# Patient Record
Sex: Female | Born: 1955 | Race: White | Hispanic: No | Marital: Married | Smoking: Former smoker
Health system: Southern US, Community
[De-identification: ages and names within clinical notes are randomized; demographics above are authoritative.]

## PROBLEM LIST (undated history)

## (undated) DIAGNOSIS — C3490 Malignant neoplasm of unspecified part of unspecified bronchus or lung: Secondary | ICD-10-CM

## (undated) DIAGNOSIS — I2699 Other pulmonary embolism without acute cor pulmonale: Secondary | ICD-10-CM

## (undated) DIAGNOSIS — E119 Type 2 diabetes mellitus without complications: Secondary | ICD-10-CM

## (undated) DIAGNOSIS — C719 Malignant neoplasm of brain, unspecified: Secondary | ICD-10-CM

## (undated) DIAGNOSIS — E039 Hypothyroidism, unspecified: Secondary | ICD-10-CM

## (undated) DIAGNOSIS — IMO0002 Reserved for concepts with insufficient information to code with codable children: Secondary | ICD-10-CM

## (undated) DIAGNOSIS — IMO0001 Reserved for inherently not codable concepts without codable children: Secondary | ICD-10-CM

## (undated) HISTORY — PX: ABDOMINAL HYSTERECTOMY: SHX81

## (undated) HISTORY — DX: Hypothyroidism, unspecified: E03.9

## (undated) HISTORY — DX: Malignant neoplasm of brain, unspecified: C71.9

## (undated) HISTORY — DX: Type 2 diabetes mellitus without complications: E11.9

## (undated) HISTORY — DX: Reserved for inherently not codable concepts without codable children: IMO0001

## (undated) HISTORY — PX: CERVICAL FUSION: SHX112

## (undated) HISTORY — DX: Other pulmonary embolism without acute cor pulmonale: I26.99

## (undated) HISTORY — PX: SPINE SURGERY: SHX786

## (undated) HISTORY — DX: Malignant neoplasm of unspecified part of unspecified bronchus or lung: C34.90

## (undated) HISTORY — DX: Reserved for concepts with insufficient information to code with codable children: IMO0002

---

## 1998-06-01 ENCOUNTER — Encounter: Payer: Self-pay | Admitting: Family Medicine

## 1998-06-01 ENCOUNTER — Ambulatory Visit (HOSPITAL_COMMUNITY): Admission: RE | Admit: 1998-06-01 | Discharge: 1998-06-01 | Payer: Self-pay | Admitting: Family Medicine

## 1998-06-11 ENCOUNTER — Encounter: Payer: Self-pay | Admitting: Family Medicine

## 1998-06-11 ENCOUNTER — Ambulatory Visit (HOSPITAL_COMMUNITY): Admission: RE | Admit: 1998-06-11 | Discharge: 1998-06-11 | Payer: Self-pay | Admitting: Family Medicine

## 1999-01-18 ENCOUNTER — Ambulatory Visit (HOSPITAL_COMMUNITY): Admission: RE | Admit: 1999-01-18 | Discharge: 1999-01-18 | Payer: Self-pay | Admitting: Family Medicine

## 1999-01-18 ENCOUNTER — Encounter: Payer: Self-pay | Admitting: Family Medicine

## 1999-06-23 ENCOUNTER — Other Ambulatory Visit: Admission: RE | Admit: 1999-06-23 | Discharge: 1999-06-23 | Payer: Self-pay | Admitting: Obstetrics & Gynecology

## 2000-05-03 ENCOUNTER — Other Ambulatory Visit: Admission: RE | Admit: 2000-05-03 | Discharge: 2000-05-03 | Payer: Self-pay | Admitting: Obstetrics & Gynecology

## 2000-06-26 ENCOUNTER — Encounter: Payer: Self-pay | Admitting: Obstetrics & Gynecology

## 2000-07-03 ENCOUNTER — Encounter (INDEPENDENT_AMBULATORY_CARE_PROVIDER_SITE_OTHER): Payer: Self-pay

## 2000-07-03 ENCOUNTER — Inpatient Hospital Stay (HOSPITAL_COMMUNITY): Admission: RE | Admit: 2000-07-03 | Discharge: 2000-07-05 | Payer: Self-pay | Admitting: Obstetrics & Gynecology

## 2000-07-25 ENCOUNTER — Ambulatory Visit (HOSPITAL_COMMUNITY): Admission: RE | Admit: 2000-07-25 | Discharge: 2000-07-25 | Payer: Self-pay | Admitting: Urology

## 2002-02-24 ENCOUNTER — Emergency Department (HOSPITAL_COMMUNITY): Admission: EM | Admit: 2002-02-24 | Discharge: 2002-02-24 | Payer: Self-pay | Admitting: Emergency Medicine

## 2002-02-24 ENCOUNTER — Encounter: Payer: Self-pay | Admitting: Emergency Medicine

## 2004-09-02 ENCOUNTER — Other Ambulatory Visit: Admission: RE | Admit: 2004-09-02 | Discharge: 2004-09-02 | Payer: Self-pay | Admitting: Obstetrics & Gynecology

## 2004-12-13 ENCOUNTER — Ambulatory Visit (HOSPITAL_COMMUNITY): Admission: RE | Admit: 2004-12-13 | Discharge: 2004-12-13 | Payer: Self-pay | Admitting: Obstetrics & Gynecology

## 2005-05-19 ENCOUNTER — Encounter: Admission: RE | Admit: 2005-05-19 | Discharge: 2005-05-19 | Payer: Self-pay | Admitting: Orthopedic Surgery

## 2005-06-20 ENCOUNTER — Encounter: Admission: RE | Admit: 2005-06-20 | Discharge: 2005-06-20 | Payer: Self-pay | Admitting: Internal Medicine

## 2005-06-23 ENCOUNTER — Ambulatory Visit (HOSPITAL_COMMUNITY): Admission: RE | Admit: 2005-06-23 | Discharge: 2005-06-24 | Payer: Self-pay | Admitting: Specialist

## 2006-06-26 ENCOUNTER — Observation Stay (HOSPITAL_COMMUNITY): Admission: EM | Admit: 2006-06-26 | Discharge: 2006-06-27 | Payer: Self-pay | Admitting: Emergency Medicine

## 2007-12-10 ENCOUNTER — Encounter
Admission: RE | Admit: 2007-12-10 | Discharge: 2007-12-10 | Payer: Self-pay | Admitting: Physical Medicine and Rehabilitation

## 2008-10-21 ENCOUNTER — Encounter: Admission: RE | Admit: 2008-10-21 | Discharge: 2008-10-21 | Payer: Self-pay | Admitting: Obstetrics & Gynecology

## 2008-10-21 ENCOUNTER — Encounter (INDEPENDENT_AMBULATORY_CARE_PROVIDER_SITE_OTHER): Payer: Self-pay | Admitting: Diagnostic Radiology

## 2009-01-28 ENCOUNTER — Encounter: Admission: RE | Admit: 2009-01-28 | Discharge: 2009-01-28 | Payer: Self-pay | Admitting: Family Medicine

## 2009-02-03 ENCOUNTER — Ambulatory Visit (HOSPITAL_COMMUNITY): Admission: RE | Admit: 2009-02-03 | Discharge: 2009-02-03 | Payer: Self-pay | Admitting: Urology

## 2009-06-11 ENCOUNTER — Encounter: Admission: RE | Admit: 2009-06-11 | Discharge: 2009-06-11 | Payer: Self-pay | Admitting: Obstetrics & Gynecology

## 2009-12-01 ENCOUNTER — Encounter: Admission: RE | Admit: 2009-12-01 | Discharge: 2009-12-01 | Payer: Self-pay | Admitting: Obstetrics & Gynecology

## 2010-09-21 ENCOUNTER — Other Ambulatory Visit: Payer: Self-pay | Admitting: Obstetrics & Gynecology

## 2010-09-21 DIAGNOSIS — N6310 Unspecified lump in the right breast, unspecified quadrant: Secondary | ICD-10-CM

## 2010-09-29 ENCOUNTER — Other Ambulatory Visit (HOSPITAL_COMMUNITY): Payer: Self-pay | Admitting: Gastroenterology

## 2010-09-29 DIAGNOSIS — G8929 Other chronic pain: Secondary | ICD-10-CM

## 2010-09-29 DIAGNOSIS — R1013 Epigastric pain: Secondary | ICD-10-CM

## 2010-10-05 ENCOUNTER — Ambulatory Visit (HOSPITAL_COMMUNITY)
Admission: RE | Admit: 2010-10-05 | Discharge: 2010-10-05 | Disposition: A | Discharge status: Home / Self Care | Payer: 59 | Source: Ambulatory Visit | Attending: Gastroenterology | Admitting: Gastroenterology

## 2010-10-05 DIAGNOSIS — R1013 Epigastric pain: Secondary | ICD-10-CM | POA: Insufficient documentation

## 2010-10-05 DIAGNOSIS — G8929 Other chronic pain: Secondary | ICD-10-CM

## 2010-10-05 MED ORDER — TECHNETIUM TC 99M MEBROFENIN IV KIT
5.0000 | PACK | Freq: Once | INTRAVENOUS | Status: AC | PRN
  Administered 2010-10-05: 5 via INTRAVENOUS

## 2010-10-14 ENCOUNTER — Other Ambulatory Visit: Payer: Self-pay | Admitting: Gastroenterology

## 2010-10-15 ENCOUNTER — Ambulatory Visit
Admission: RE | Admit: 2010-10-15 | Discharge: 2010-10-15 | Disposition: A | Discharge status: Home / Self Care | Payer: 59 | Source: Ambulatory Visit | Attending: Obstetrics & Gynecology | Admitting: Obstetrics & Gynecology

## 2010-10-15 DIAGNOSIS — N6310 Unspecified lump in the right breast, unspecified quadrant: Secondary | ICD-10-CM

## 2010-10-15 DIAGNOSIS — R921 Mammographic calcification found on diagnostic imaging of breast: Secondary | ICD-10-CM

## 2010-12-06 LAB — BASIC METABOLIC PANEL
BUN: 19 mg/dL (ref 6–23)
CO2: 26 mEq/L (ref 19–32)
Calcium: 9.4 mg/dL (ref 8.4–10.5)
Chloride: 106 mEq/L (ref 96–112)
Creatinine, Ser: 0.83 mg/dL (ref 0.4–1.2)
GFR calc Af Amer: >60 mL/min (ref >60)
GFR calc non Af Amer: >60 mL/min (ref >60)
Glucose, Bld: 134 mg/dL — ABNORMAL HIGH (ref 70–99)
Potassium: 4.2 mEq/L (ref 3.5–5.1)
Sodium: 139 mEq/L (ref 135–145)

## 2010-12-06 LAB — HEMOGLOBIN AND HEMATOCRIT, BLOOD
HCT: 38.3 % (ref 36.0–46.0)
Hemoglobin: 13.3 g/dL (ref 12.0–15.0)

## 2010-12-06 LAB — GLUCOSE, CAPILLARY
Glucose-Capillary: 122 mg/dL — ABNORMAL HIGH (ref 70–99)
Glucose-Capillary: 130 mg/dL — ABNORMAL HIGH (ref 70–99)

## 2011-01-11 NOTE — Op Note (Signed)
NAME:  Carol Bond, Carol Bond               ACCOUNT NO.:  [id number]   MEDICAL RECORD NO.:  [id number]          PATIENT TYPE:  AMB   LOCATION:  DAY                          FACILITY:  [facility]   PHYSICIAN:  Lindaann Slough, M.D.  DATE OF BIRTH:  20-May-1956   DATE OF PROCEDURE:  02/03/2009  DATE OF DISCHARGE:                               OPERATIVE REPORT   PREOPERATIVE DIAGNOSIS:  Right ureteral stone with moderate  hydronephrosis.   POSTOPERATIVE DIAGNOSIS:  Right ureteral stone with moderate  hydronephrosis.   PROCEDURE:  Cystoscopy, ureteroscopy and stone extraction.   SURGEON:  Danae Chen, M.D.   ANESTHESIA:  General.   INDICATION:  The patient is a 55 years old female who has been  complaining of right flank pain on and off for the past 6 weeks.  The  pain was associated with gross hematuria.  She was treated a couple of  times for urinary tract infection.  Then CT scan showed a 3 x 6 mm stone  in the right distal ureter.  She was initially treated conservatively.  However, she has not passed the stone and has continued to be  symptomatic.  She is scheduled today for cystoscopy and stone  manipulation.   DESCRIPTION OF PROCEDURE:  The patient was identified by her wrist band  and proper time-out was taken.   Under general anesthesia she was prepped and draped and placed in dorsal  lithotomy position.  A panendoscope was inserted in the bladder.  There  was some edema around the right ureteral orifice and the stone could be  seen crowning at the orifice.  The left ureteral orifice is normal.  There is no stone or tumor in the bladder.   The Glidewire was passed through open-ended catheter and passed through  the ureteral orifice.  The ureteral catheter was advanced over the  guidewire in the distal ureter and then the Glidewire was removed and  replaced with a guide wire.  The open-ended catheter was then removed.  The [facility] rigid ureteroscope was then passed in the ureter and the  stone  was extracted without difficulty with a nitinol basket.  The  ureteroscope was then reinserted in the ureter and there was no evidence  of remaining stone fragment in the ureter.  The ureteroscope was  advanced into the distal and mid ureter and without evidence of filling  defect or stone in the ureter.  The ureteroscope was then removed.  The  cystoscope was reinserted in the bladder and a small stone fragment was  removed from the bladder  with a grasping forceps. The ureteral orifice was widely patent and I do  not think it was necessary to leave the double-J stent.  I then removed  the guidewire.  The bladder was emptied and cystoscope removed.   The patient tolerated the procedure well and left the OR in satisfactory  condition to post anesthesia care unit.      Lindaann Slough, M.D.  Electronically Signed     MN/MEDQ  D:  02/03/2009  T:  02/03/2009  Job:  [id number]

## 2011-01-14 NOTE — Op Note (Signed)
[facility]  Patient:    Carol Bond, Carol Bond                   MRN: [id number] Proc. Date: 07/03/00 Adm. Date:  [id number] Attending:  Mickle Mallory CC:         Gerrit Friends. Aldona Bar, M.D.   Operative Report  PREOPERATIVE DIAGNOSIS:  Stress urinary incontinence.  POSTOPERATIVE DIAGNOSIS:  Stress urinary incontinence.  PROCEDURES PERFORMED:  Anterior repair, pubovaginal sling, flexible cystoscopy, suprapubic tube placement.  SURGEON:  Barron Alvine, M.D.  ANESTHESIA:  General.  INDICATIONS:  Carol Bond is a 55 year old female.  She has been evaluated by Dr. Aldona Bar and felt to require hysterectomy because of some abnormal vaginal bleeding.  At the time of her evaluation, she also complained of a several year history of stress urinary incontinence.  By history, she had leakage with sneezing, laughing, and occasionally with coughing.  She had no other significant irritative voiding symptoms.  Objectively, we found her to have some evidence of urethral hypermobility.  She had a mild grade 2 cystocele. We were unable to find objective leakage in the sitting position with Valsalva and coughing but certainly by history she had moderate incontinence that was recently bothersome to her.  We had explained to the patient that with hysterectomy there can be further loss of support of the bladder base and urethra, and it is not unusual for incontinence to worsen.  It would be unlikely that her incontinence would improve status post hysterectomy.  We discussed the pros and cons of simple anterior repair versus anterior repair with pubovaginal sling.  She understood the advantages of this approach but also all the potential complications and risks.  After this discussion, the patient did elect to have a concurrent pubovaginal sling along with anterior repair in conjunction with her vaginal hysterectomy.  TECHNIQUE AND FINDINGS:  We entered the operating room with  the patient already under general anesthesia.  Dr. Aldona Bar had completed a vaginal hysterectomy without complications.  There had been minimal blood loss.  He left a portion of the vaginal cuff open anteriorly to allow [place] to start our anterior repair and exposure of the bladder base for her concurrent pubovaginal sling.  She was in high lithotomy.  We elected to reprep and drape the area up higher on the suprapubic region and placed her in a moderate lithotomy position at that time.  I infiltrated the vaginal mucosa and then extended the incision from the open area of the cuff up towards the mid urethraL area.  The vaginal flaps were then established, and these planes were easily dissected.  In this manner, we were able to dissect out the bladder. The bladder neck area was identified.  The retropubic spaces underwent blunt dissection bilaterally, and there was no evidence of significant urethral adhesions.  We then made a suprapubic incision right over the pubic symphysis, was carried down to the level of the fascia.  The actual sling itself was made with a piece of fascia lata from the [employer].  This was a 2 x 7 cm piece which was anchored on both ends with #1 nylon suture.  With direct digital finger control, a clamp was able to be passed behind the pubic symphysis and out the right side of the vaginal incision.  The nylon suture was grabbed and then brought out the suprapubic area.  A similar procedure was done on the left side, again with direct finger control throughout the procedure.  A Foley catheter was then placed in the bladder, and the bladder was decompressed.  The sling was then positioned underneath the bladder neck and pexed in open position with 3-0 Vicryl suture.  Flexible cystoscopy was then performed.  The sling appeared to be well positioned at the bladder neck. Blue dye could be seen from both orifices, and there was no evidence of bladder injury.  The  suprapubic tube was placed with direct visual guidance through a percutaneous site just above her suprapubic incision.  Prior to placement of the sling, we did place three or four horizontal 2-0 Vicryl mattress sutures to reduce the cystocele in a standard anterior repair manner. Her cystocele was grade 1-2.  A small amount of anterior redundant vaginal mucosa was then trimmed, and the vaginal incision was closed with a running 2-0 Vicryl suture, including the previously mentioned open area near the vaginal cuff.  Vaginal packing was utilized.  The suprapubic wound was infiltrated with Marcaine and copiously irrigated.  The nylon sutures were then tied very loosely over 2-3 fingers.  The subcutaneous tissues were closed with some Vicryl suture, and the skin was closed with clips.  The suprapubic tube was left to gravity during this period.  Estimated blood loss was 100-200 cc.  The patient tolerated the procedure well.  There were no obvious complications.  She was brought to the recovery room in stable condition. DD:  07/03/00 TD:  07/03/00 Job: [id number]

## 2011-01-14 NOTE — Op Note (Signed)
[facility]  Patient:    Carol Bond, Carol Bond                   MRN: [id number] Proc. Date: 07/03/00 Adm. Date:  [id number] Attending:  Mickle Mallory                           Operative Report  PREOPERATIVE DIAGNOSES:  Menorrhagia, uterine prolapse and stress urinary incontinence.  POSTOPERATIVE DIAGNOSES:  Menorrhagia, uterine prolapse and stress urinary incontinence.  PATHOLOGY:  Pending.  PROCEDURE:  First part total vaginal hysterectomy, second part to be dictated by Dr. Isabel Caprice pubovaginal sling.  SURGEON:  Dr. Aldona Bar (first part)  ASSISTANT:  Dr. Greta Doom (first part)  ANESTHESIA:  General endotracheal, Dr. Almeta Monas.  DESCRIPTION OF PROCEDURE:  The patient was taken to the operating room and after satisfactory induction of general endotracheal anesthesia was prepped and draped in the usual fashion having been placed in lithotomy position in the short Allen stirrups. At this time, after she was prepped and draped, the bladder was drained of clear urine ______ catheter in an in and out fashion and the procedure was begun.  A posterior weighted speculum was placed in the vagina and a double tooth tenaculum placed on the cervix. The cervix was circumferentially injected at this time with a solution of 0.5% Marcaine with epinephrine. Thereafter a circumferential incision was made about the cervix separating the cervix from the vagina. This was dissected posteriorly and the posterior peritoneum was identified and entered appropriately. At this time, the uterosacral pedicles were taken bilaterally using curved Heaney clamps and suture ligature of #0 Vicryl suture. At this time, additional parametrial pedicles were taken a similar fashion. Thereafter the anterior peritoneum was identified and entered appropriately with bowel well seen above the uterus. At this time, the uterine artery pedicles were clamped, cut and suture secured with #0 Vicryl  suture. The specimen was then inverted. The ovarian pedicles were then clamped, cut and doubly suture secured with #0 Vicryl suture. The specimen at this time was removed in its entirety. Both ovaries and tubes looked normal. The posterior cuff was then rendered hemostatic with a running #0 Vicryl suture which was locked from uterosacral to uterosacral. Assured of no bleeding, at this time the peritoneum was closed using #0 Vicryl in a pursestring suture extraperitonealizing all vascular pedicles except for the ovarian pedicles which were cut free. The uterosacrals were then brought together in the midline. The superior portion of the vaginal cuff was left open for Dr. Isabel Caprice to continue with the pubovaginal sling. Estimated blood loss up to this point approximately 150 cc. All counts correct x 2. Pathologic specimen consisted of the uterus. DD:  07/03/00 TD:  07/03/00 Job: [id number]

## 2011-01-14 NOTE — Discharge Summary (Signed)
[facility]  Patient:    Carol Bond, Carol Bond                   MRN: [id number] Adm. Date:  [id number] Disc. Date: 07/05/00 Attending:  Mickle Mallory                           Discharge Summary  DISCHARGE DIAGNOSES: 1. Menorrhagia. 2. Uterine prolapse. 3. Stress urinary incontinence.  PROCEDURES: 1. Total vaginal hysterectomy, Dr. Aldona Bar. 2. Anterior repair, pubovaginal sling, flex cystoscopy, suprapubic catheter,    Dr. Isabel Caprice.  HOSPITAL COURSE:  This 55 year old, gravida 2, para 2, was admitted after total vaginal hysterectomy and pubovaginal sling, anterior repair, suprapubic catheter placement, and cystoscopy which was carried out on July 03, 2000. Her preoperative course was acceptable.  Her procedure went well.  Pathologic specimen included the uterus, and final diagnosis was adenomyosis and leiomyomata.  The uterus weighed 114 grams.  The patients postoperative course was totally uncomplicated.  She remained afebrile during her entire postoperative course.  Her discharge hemoglobin was 11.3 with a white blood count of 6.4.  On the morning of November 7, she was ambulating well, tolerating a regular diet well, having normal bowel function, was afebrile, was tolerating oral analgesia well, and was deemed ready for discharge.  The suprapubic catheter was still in place, and she was awaiting instructions by Dr. Isabel Caprice as she was having some difficulty spontaneously and was using the suprapubic catheter on her on for voiding purposes.  DISCHARGE FOLLOWUP:  Follow-up by Dr. Isabel Caprice will be probably in several days in the office.  Follow-up by [place] will be three weeks time.  DISCHARGE MEDICATIONS: 1. Tylox one to two q.4-6h. p.r.n. pain 2. Motrin 600 mg q.6h. (she was given prescriptions for both).  CONDITION ON DISCHARGE:  Improved.  DISCHARGE INSTRUCTIONS:  She was given all appropriate instructions at the time of discharge. DD:   07/05/00 TD:  07/05/00 Job: [id number]

## 2011-01-14 NOTE — H&P (Signed)
NAME:  Carol Bond, LUTZE NO.:  [id number]   MEDICAL RECORD NO.:  [id number]          PATIENT TYPE:  INP   LOCATION:  2017                         FACILITY:  [facility]   PHYSICIAN:  Francisca December, M.D.  DATE OF BIRTH:  October 28, 1955   DATE OF ADMISSION:  06/26/2006  DATE OF DISCHARGE:                                HISTORY & PHYSICAL   PRIMARY CARE PHYSICIAN:  Dario Guardian, M.D.   CARDIOLOGIST:  Francisca December, M.D.   CHIEF COMPLAINT:  Chest pain.   HISTORY OF PRESENT ILLNESS:  Carol Bond is a 55 year old Caucasian female  with no known cardiac history.  The patient complained of chest pressure for  the past 2 weeks that is located in her left breast area.  She has a history  of GERD and typically treats her heartburn-related illness with Prilosec OTC  20 mg.  She initially thought her chest pain was related to GERD and took  Prilosec OTC 20 mg without relief.  During this past weekend, intermittent  chest pain started that radiated to her left shoulder and to the back of her  left arm as well as her back.  This morning she presented to the primary  care physicians office and a 12-lead EKG was obtained.  The EKG revealed  changes in comparison with a prior EKG that was obtained in 1999.  The EKG  changes were not specified to the patient, nor was the 1999 EKG forwarded  with the patient to the [facility] emergency department.  The patient arrived to  the [facility] emergency department via ambulance and was given en route one  aspirin 325 mg plus two sublingual nitroglycerin plus oxygen with relief.  Upon arrival to the [facility] emergency department, a 12-lead EKG was obtained  that revealed normal sinus rhythm at 67 beats per minute with first degree  AV block and old Q wave at the anteroseptal leads.  There was no evidence of  ischemia.  Associated with the chest pain was severe diaphoresis that had  resolved upon arrival to the ED.  The patient also experienced headaches  and  nosebleeds while at home that she believes is secondary to elevated blood  pressure.  She denies shortness of breath, nausea, and vomiting.   PAST MEDICAL HISTORY:  1. History of DVT/PE in 1979 during her pregnancy.  2. Dyslipidemia.  3. Hypothyroidism.  4. Depression.  5. Tobacco abuse.   ALLERGIES:  No known drug allergies.   CURRENT MEDICATIONS:  1. Levothroid 0.5 mg daily.  2. Lipitor 10 mg daily.  3. Paxil 25 mg daily.   FAMILY HISTORY:  Father deceased, hypertension.  Mother deceased, brain  tumor.   SOCIAL HISTORY:  Married with two daughters.  Lives with her husband and her  [place]  She admits to tobacco abuse with a 45  pack-year history of smoking.  She denies alcohol or illicit drug use.   REVIEW OF SYSTEMS:  All other systems reviewed are negative other than what  is stated in the HPI.   PHYSICAL EXAMINATION:  GENERAL:  A 55 year old female, pleasant and  cooperative, NAD.  VITAL SIGNS:  Temperature 98.3, blood pressure 120/68, pulse 68,  respirations 18, O2 saturations 95% over 2 L.  HEENT:  Unremarkable.  NECK:  Supple without JVD or carotid bruits bilaterally.  PULMONARY:  Breath sounds are equal and clear to auscultation bilaterally.  No use of accessory muscles.  CARDIOVASCULAR:  Regular rate and rhythm.  Normal S1 and S2 without murmurs,  gallops, clicks or rubs.  ABDOMEN:  Soft, nontender, nondistended, with active bowel sounds.  No  masses, organomegaly or bilateral bruits.  EXTREMITIES:  No peripheral edema.  DP and PT pulses 2+/2 bilaterally.  SKIN:  Warm and dry without rashes or lesions.  NEUROLOGIC:  No sensory or motor deficits.  PSYCHIATRIC:  Normal mood and affect.  BACK:  No kyphosis or scoliosis.   LABORATORY DATA:  White blood count 6.6, hemoglobin 14.3, hematocrit 41.5,  platelets 242.  Sodium 137, potassium 4.0, chloride 106, bicarb 26.5, BUN  17, creatinine 0.9, glucose 117.  Point of care enzymes x1:   Myoglobin 65.7,  CK-MB 1.2, troponin I less than 0.05.  Chest x-ray:  No acute  cardiopulmonary process.  Serial cardiac enzymes:  CK total 70, CK-MB 1.5,  troponin I 0.01.   ASSESSMENT:  1. Chest pain.  2. Gastroesophageal reflux disease.  3. History of deep vein thrombosis/pulmonary embolus in 1979 during her      pregnancy.  4. Dyslipidemia.  5. Hypothyroidism.  6. Tobacco abuse.  7. Depression.   PLAN:  1. Admit to cardiac telemetry unit under the service of Francisca December,      M.D., for the diagnosis of chest pain.  2. Obtain cardiac panel, including troponin I, q.8h. x3 with the first set      to be drawn now.  3. Start subcu Lovenox every 12 hours per pharmacy protocol.  4. Start nitroglycerin at 10 mcg/min.  Titrate by 5 mcg/min. for chest      pain relief as needed.  5. Smoking cessation.  6. Stress Cardiolite in the a.m.  7. N.P.O. after midnight except for medications.   The patient was seen, interviewed, and examined by Dr. Corliss Marcus, who  participated in the medical decision-making and plan of care.      Tylene Fantasia, [place]      Francisca December, M.D.  Electronically Signed    RDM/MEDQ  D:  06/27/2006  T:  06/27/2006  Job:  [id number]

## 2011-01-14 NOTE — Op Note (Signed)
NAME:  Carol Bond, Carol Bond               ACCOUNT NO.:  [id number]   MEDICAL RECORD NO.:  [id number]          PATIENT TYPE:  AMB   LOCATION:  DAY                          FACILITY:  [facility]   PHYSICIAN:  Jene Every, M.D.    DATE OF BIRTH:  August 27, 1956   DATE OF PROCEDURE:  06/23/2005  DATE OF DISCHARGE:                                 OPERATIVE REPORT   PREOPERATIVE DIAGNOSES:  Spinal stenosis, herniated nucleus pulposus 4-5.   POSTOPERATIVE DIAGNOSES:  Spinal stenosis, herniated nucleus pulposus 4-5.   PROCEDURE:  Bilateral lateral recessed decompression, foraminotomies of L5,  microdiskectomy L4-5, left.   ANESTHESIA:  General.   ASSISTANT:  Roma Schanz, P.A.   Use of operating microscope.   BRIEF HISTORY:  A 55 year old with lower extremity radicular pain, left  greater than right, lateral recess stenosis, paracentral disk protrusion at  4-5 noted. On her MRI, she had refractory left lower extremity radicular  pain, EHL weakness, positive neural tension signs. She actually had right  sided symptoms as well thought perhaps some dynamic neural compression.  Operative intervention was indicated for decompression and microdiskectomy.  Evaluation bilaterally. The risks and benefits were discussed including  bleeding, infection, damage to neurovascular structures, CSF leakage,  epidural fibrosis, adjacent segment disease, need for fusion in the future,  anesthetic complications, etc.   TECHNIQUE:  The patient in supine position and after the induction of  adequate general anesthesia and 1 gram of Kefzol, she was placed prone on  the Andrews frame with the cervical spine placed in slight flexion. She had  some underlying cervical spondylosis noted on a recent myelogram. This was  performed at L4-5 and she had no preoperative headache. The lumbar region  was prepped and draped in the usual sterile fashion, two 18 gauge spinal  needles were utilized to localize the 4-5 interspace,  confirmed with x-ray.  An incision made from the spinous process of 4 to 5, subcutaneous tissue was  dissected, electrocautery was utilized to achieve hemostasis. The  dorsolumbar fascia identified and divided in line with the skin incision,  paraspinous muscle elevated from the lamina of 4 and 5 bilaterally. A  McCullough retractor was placed bilaterally, Penfield 4 in the intralaminar  space. Confirmatory radiograph obtained confirming the 4-5 space. The  operating microscope was draped and brought on the surgical field. Attention  turned first to the left side, hemilaminotomy of the caudad edge of 4 was  performed with a 2-3 mm Kerrison, detachment of the ligamentum flavum.  Hypertrophic ligamentum flavum was noted. A curette utilized to detach the  cephalad edge of the ligamentum from 5. Foraminotomy of 5 was performed with  a 2 mm Kerrison. The ligamentum flavum removed from the interspace. The  Penfield 4 was utilized to gently mobilize the thecal sac and the nerve root  medially, compression in the lateral recess was appreciated and the facet  was undercut with a 2 mm Kerrison decompressing the full lateral recess.  Bipolar electrocautery was utilized to achieve hemostasis. There was an  epidural venous plexus noted. A small paracentral disk protrusion was noted.  Annulotomy performed and a copious portion of disk material was removed from  the disk space, subannular space. Medially with the straight and upbiting  pituitary, multiple passes were obtained as well as out laterally. It was  further mobilized in the disk space with an Epstein and a hockey stick not  curetting the end plates, however. After a full diskectomy of herniated  material, there was degenerative gelatinous material appreciated,  degenerated disk was appreciated as well. I copiously irrigated the disk  space with antibiotic irrigation. Bone wax placed on the cancellous  surfaces. We turned our attention to the  contralateral side in a similar  fashion, the lateral recess was decompressed on the contralateral side with  the foraminotomy of L5 performed. The ligamentum flavum removed from the  interspace, facet undercut, nerve root and thecal sac gently mobilized  medially. The disk was not herniated here. There was a large epidural vein  that was cauterized, however. Some moderate lateral recess stenosis was  noted here as we decompressed this. No evidence of CSF leakage or active  bleeding, copious irrigation was performed and the contralateral cancellous  surface was covered with the bone wax as well, thrombin soaked Gelfoam in  the intralaminar space. We placed a hockey stick probe out in the foramen of  5 and 4 on left found to be widely patent. We checked beneath the thecal  sac, axilla of the nerve root, no evidence of any neural compressive  material. We turned our attention back to the left. I gently mobilized the  thecal sac and nerve root medially, made a few additional passes with the  straight pituitary without any retrieval. I examined the foramen of 4 and 5,  widely patent, at least of a centimeter of excursion of the nerve root  medial to the pedicle without difficulty. The disk space was copiously  irrigated with antibiotic irrigation, no disk material was noted beneath the  thecal sac either. Inspection revealed no evidence of CSF leakage or active  bleeding. Thrombin soaked Gelfoam was placed in the laminotomy defect,  McCullough retractor was removed, paraspinous muscles inspected with no  evidence of active bleeding. The dorsolumbar fascia reapproximated with #1  Vicryl interrupted figure-of-eight sutures. The subcutaneous tissues with 2-  0 Vicryl simple sutures, skin 4-0 subcuticular Prolene, wound reinforced  with Steri-Strips. A sterile dressing applied, placed supine on the hospital  bed, extubated without difficulty and transported to the recovery room in  satisfactory  condition.   The patient tolerated the procedure well with no complications. Blood loss  minimal.      Jene Every, M.D.  Electronically Signed     JB/MEDQ  D:  06/23/2005  T:  06/23/2005  Job:  [id number]

## 2011-01-14 NOTE — H&P (Signed)
[facility]  Patient:    Carol Bond, Carol Bond                        MRN: [id number] Adm. Date:  07/03/00 Attending:  Gerrit Friends. Aldona Bar, M.D.                         History and Physical  HISTORY OF PRESENT ILLNESS:  Trea Carnegie is a 55 year old, married female, gravida 2, para 2, admitted for total vaginal hysterectomy and a vaginal sling procedure.  She has a history of menorrhagia, prolapse, and stress urinary incontinence.  She is having regular cyclic menses but her periods are much heavier than one would normally expect, although she has not had any associated anemia.  She does also relate increased pelvic pressure when standing for long periods of time and for the past several years has had worsening stress urinary incontinence for which recently she has been seen by Dr. Barron Alvine who will be doing a pubovaginal sling after the total vaginal hysterectomy is completed.  The patients past history is significant in that she has a history of a deep vein thrombosis with a pregnancy and in addition had a pulmonary embolus--spontaneously--in the 1970s, not associated with birth control use. She also has hyperlipidemia for which she is currently on medication and she is also a smoker and has had a problem with a cervical disk which required fusion.  She has had a history of two previous vaginal deliveries, the last of which was in the early 1980s.  She recently has had normal cervical cytology and normal mammograms.  She also has hypothyroidism which she currently is medicated for as well.  At this time she is admitted for total vaginal hysterectomy and pubovaginal sling with a preoperative diagnosis of uterine prolapse, menorrhagia, and stress urinary incontinence.  ALLERGIES:  The patient has no known allergies.  MEDICATIONS: 1. Synthroid 0.175 mg a day. 2. Lipitor 10 mg at bedtime.  PAST SURGICAL HISTORY:  Cervical fusion as mentioned.  She also  has had a previous tubal sterilization procedure which was carried out in 1988.  Social history, family history negative with the exception of the above.  REVIEW OF SYSTEMS:  Negative with the exception of the above.  PHYSICAL EXAMINATION:  GENERAL:  Examination at the time of admission finds a well-developed female, weight 186, height 5 feet 6.5 inches.  VITAL SIGNS:  Blood pressure 120/80, temperature 98.2, respirations 18 and regular, pulse 85 and regular.  HEENT:  Negative.  Thyroid not enlarged.  CHEST:  Clear to auscultation and percussion.  CARDIOVASCULAR:  Normal rhythm.  No murmur.  BREASTS:  Negative.  ABDOMEN:  On abdominal examination no masses are felt.  Bowel sounds are well heard.  PELVIC:  On pelvic examination there is prolapse of the cervix almost to the introitus.  There are no gross lesions in the vagina on the cervix or on the introitus.  Uterus is upper limits of normal size.  Adnexal area is negative. Rectovaginal confirmatory.  EXTREMITIES:  Negative.  NEUROLOGICAL:  Neurologic examination physiologic.  IMPRESSION:  Uterine prolapse, menorrhagia, and stress urinary incontinence.  PLAN:  The patient will undergo a total vaginal hysterectomy and pubovaginal sling.  She will also be prophylaxed with subcu heparin postoperatively as well as compression stockings because of her history of pulmonary embolus in the past.  She will be maintained on her Lipitor and Synthroid for her hypertriglyceridemia  and her hypothyroidism respectively.  DD:  06/30/00 TD:  06/30/00 Job: [id number]

## 2011-01-14 NOTE — Discharge Summary (Signed)
NAME:  Carol Bond, SCHMIEDER NO.:  [id number]   MEDICAL RECORD NO.:  [id number]          PATIENT TYPE:  INP   LOCATION:  2017                         FACILITY:  [facility]   PHYSICIAN:  Francisca December, M.D.  DATE OF BIRTH:  08/10/1956   DATE OF ADMISSION:  06/26/2006  DATE OF DISCHARGE:  06/27/2006                               DISCHARGE SUMMARY   ADMISSION DIAGNOSIS:  Chest pain.   DISCHARGE DIAGNOSES:  1. Chest pain status post normal stress Cardiolite without inducible      ischemia.  Ejection fraction 76% on June 27, 2006.  2. Gastroesophageal reflux disease.  3. Dyslipidemia.  4. Hypertriglyceridemia.  5. Hypothyroidism.  6. Depression.  7. Tobacco abuse with recommended cessation.  8. History of [employer] deep vein thrombosis/pulmonary embolism in 1979,      during her pregnancy.   PROCEDURE:  Stress Cardiolite on June 27, 2006.   HOSPITAL COURSE:  Ms. Kain is a 55 year old female with no known  cardiac history.  She was admitted to the [facility] on June 26, 2006, with a chief complaint of chest pain.  Point-of-care enzymes  were negative x2, with a peak troponin of less than 0.05.  Serial  cardiac enzymes were negative x3, with a peak troponin of 0.02.  A chest  x-ray revealed no acute cardiopulmonary disease.  A 12-lead EKG revealed  a normal sinus rhythm with first-degree AV block with a ventricular rate  of 67 beats per minute and old Q-waves in the anteroseptal leads.  There  was no evidence of ischemia.  The patient was referred for smoking  cessation consult and was given a prescription upon discharge of  Chantix.  The following day the patient underwent a stress Cardiolite revealing no  inducible ischemia with an EF of.  She was started on Protonix 40 mg  daily as an inpatient and given a prescription upon discharge.  Ms. Morford was discharged to home in stable condition on June 27, 2006, without angina, shortness of  breath, dizziness, or diaphoresis.   LABORATORY DATA:  White blood count 6.4, hemoglobin 13.9, hematocrit  39.6, platelets 229.  PT 12.5, INR 0.9, PTT 27.  BNP less than 30.  Sodium 140, potassium 4.7, chloride 103, CO2 30, glucose 142, BUN 17,  creatinine 0.8.  Serial cardiac enzymes, CK total 70, 65, and 66,  respectively; CK MB 1.5 x2, and 1.3; Troponin I 0.01 x2, and 0.02.  Fasting lipid panel revealed a total cholesterol of 287, triglycerides  of 1261, HDL 23, LDL was unable to be calculated.  Point-of-care enzymes  myoglobin 65.7 and 55.3, CK MB 1.2 and 1.5, troponin I less than 0.05  x2.   X-RAYS:  Chest x-ray, June 26, 2006, no active cardiopulmonary  disease.   EKG:  June 27, 2006, sinus rhythm with first-degree AV block with a  ventricular rate of 70 beats per minute and Q-waves in the anteroseptal  leads.  No evidence of ischemia.   CONDITION ON DISCHARGE:  Ms. Bealer was discharged to home in stable  condition without complaints of angina, shortness  of breath, dizziness,  nausea, vomiting, or diaphoresis.   DISCHARGE MEDICATIONS:  1. Levothyroxine 50 mcg daily.  2. Paxil 25 mg daily.  3. Lipitor 10 mg daily.  4. Protonix 40 mg daily.  This was a new prescription, a prescription      was given with refills.  5. Chantix 1 mg starter pack.  This was a new prescription, a      prescription was given with refills.   DISCHARGE INSTRUCTIONS:  Continue a heart-healthy diet including low  cholesterol and low fat.   FOLLOWUP ARRANGEMENTS:  The patient was advised to schedule a followup  appointment with her primary care physician for other etiology of chest  pain.      Tylene Fantasia, [place]      Francisca December, M.D.  Electronically Signed    RDM/MEDQ  D:  08/08/2006  T:  08/08/2006  Job:  [id number]   cc:   Dario Guardian, M.D.

## 2011-09-21 ENCOUNTER — Other Ambulatory Visit: Payer: Self-pay | Admitting: Obstetrics & Gynecology

## 2011-09-21 DIAGNOSIS — R920 Mammographic microcalcification found on diagnostic imaging of breast: Secondary | ICD-10-CM

## 2011-10-17 ENCOUNTER — Ambulatory Visit
Admission: RE | Admit: 2011-10-17 | Discharge: 2011-10-17 | Disposition: A | Discharge status: Home / Self Care | Payer: 59 | Source: Ambulatory Visit | Attending: Obstetrics & Gynecology | Admitting: Obstetrics & Gynecology

## 2011-10-17 DIAGNOSIS — R920 Mammographic microcalcification found on diagnostic imaging of breast: Secondary | ICD-10-CM

## 2012-09-18 ENCOUNTER — Other Ambulatory Visit: Payer: Self-pay | Admitting: Obstetrics & Gynecology

## 2012-09-18 DIAGNOSIS — Z1231 Encounter for screening mammogram for malignant neoplasm of breast: Secondary | ICD-10-CM

## 2012-10-17 ENCOUNTER — Ambulatory Visit: Payer: 59

## 2012-11-15 ENCOUNTER — Ambulatory Visit
Admission: RE | Admit: 2012-11-15 | Discharge: 2012-11-15 | Disposition: A | Discharge status: Home / Self Care | Payer: 59 | Source: Ambulatory Visit | Attending: Obstetrics & Gynecology | Admitting: Obstetrics & Gynecology

## 2012-12-03 ENCOUNTER — Other Ambulatory Visit: Payer: Self-pay | Admitting: Gastroenterology

## 2013-01-25 ENCOUNTER — Other Ambulatory Visit: Payer: Self-pay | Admitting: Neurological Surgery

## 2013-01-25 DIAGNOSIS — M549 Dorsalgia, unspecified: Secondary | ICD-10-CM

## 2013-02-07 ENCOUNTER — Ambulatory Visit
Admission: RE | Admit: 2013-02-07 | Discharge: 2013-02-07 | Disposition: A | Discharge status: Home / Self Care | Payer: 59 | Source: Ambulatory Visit | Attending: Neurological Surgery | Admitting: Neurological Surgery

## 2013-02-07 VITALS — BP 91/54 | HR 69 | Ht 66.0 in | Wt 188.0 lb

## 2013-02-07 DIAGNOSIS — M549 Dorsalgia, unspecified: Secondary | ICD-10-CM

## 2013-02-07 MED ORDER — DIAZEPAM 5 MG PO TABS
10.0000 mg | ORAL_TABLET | Freq: Once | ORAL | Status: AC
  Administered 2013-02-07: 10 mg via ORAL

## 2013-02-07 MED ORDER — ONDANSETRON HCL 4 MG/2ML IJ SOLN
4.0000 mg | Freq: Once | INTRAMUSCULAR | Status: AC
  Administered 2013-02-07: 4 mg via INTRAMUSCULAR

## 2013-02-07 MED ORDER — IOHEXOL 180 MG/ML  SOLN
15.0000 mL | Freq: Once | INTRAMUSCULAR | Status: AC | PRN
  Administered 2013-02-07: 15 mL via INTRATHECAL

## 2013-02-07 MED ORDER — MEPERIDINE HCL 100 MG/ML IJ SOLN
75.0000 mg | Freq: Once | INTRAMUSCULAR | Status: AC
  Administered 2013-02-07: 75 mg via INTRAMUSCULAR

## 2013-02-07 NOTE — Progress Notes (Signed)
Pt states she has been off lexapro for the past 2 days. Discharge instructions explained.

## 2013-11-07 ENCOUNTER — Other Ambulatory Visit: Payer: Self-pay

## 2013-11-07 DIAGNOSIS — Z1231 Encounter for screening mammogram for malignant neoplasm of breast: Secondary | ICD-10-CM

## 2013-11-28 ENCOUNTER — Ambulatory Visit: Payer: 59

## 2013-12-03 ENCOUNTER — Other Ambulatory Visit: Payer: Self-pay | Admitting: Family Medicine

## 2013-12-03 DIAGNOSIS — Z86711 Personal history of pulmonary embolism: Secondary | ICD-10-CM

## 2013-12-03 DIAGNOSIS — R059 Cough, unspecified: Secondary | ICD-10-CM

## 2013-12-03 DIAGNOSIS — R05 Cough: Secondary | ICD-10-CM

## 2013-12-03 DIAGNOSIS — R0602 Shortness of breath: Secondary | ICD-10-CM

## 2013-12-05 ENCOUNTER — Ambulatory Visit
Admission: RE | Admit: 2013-12-05 | Discharge: 2013-12-05 | Disposition: A | Discharge status: Home / Self Care | Payer: 59 | Source: Ambulatory Visit

## 2013-12-05 DIAGNOSIS — Z1231 Encounter for screening mammogram for malignant neoplasm of breast: Secondary | ICD-10-CM

## 2013-12-09 ENCOUNTER — Ambulatory Visit
Admission: RE | Admit: 2013-12-09 | Discharge: 2013-12-09 | Disposition: A | Discharge status: Home / Self Care | Payer: 59 | Source: Ambulatory Visit | Attending: Family Medicine | Admitting: Family Medicine

## 2013-12-09 DIAGNOSIS — R059 Cough, unspecified: Secondary | ICD-10-CM

## 2013-12-09 DIAGNOSIS — Z86711 Personal history of pulmonary embolism: Secondary | ICD-10-CM

## 2013-12-09 DIAGNOSIS — R0602 Shortness of breath: Secondary | ICD-10-CM

## 2013-12-09 DIAGNOSIS — R05 Cough: Secondary | ICD-10-CM

## 2013-12-09 MED ORDER — IOHEXOL 350 MG/ML SOLN
125.0000 mL | Freq: Once | INTRAVENOUS | Status: AC | PRN
Start: 2013-12-09 — End: 2013-12-09
  Administered 2013-12-09: 125 mL via INTRAVENOUS

## 2013-12-10 ENCOUNTER — Telehealth: Payer: Self-pay | Admitting: *Deleted

## 2013-12-10 NOTE — Telephone Encounter (Signed)
Called left vm message regarding appt for [facility] 12/12/13

## 2013-12-10 NOTE — Telephone Encounter (Signed)
Called pt with appt for [facility] 12/12/13.  She verbalized understanding of appt time and place

## 2013-12-12 ENCOUNTER — Encounter: Payer: Self-pay | Admitting: *Deleted

## 2013-12-12 ENCOUNTER — Ambulatory Visit
Admission: RE | Admit: 2013-12-12 | Discharge: 2013-12-12 | Disposition: A | Discharge status: Home / Self Care | Payer: 59 | Source: Ambulatory Visit | Attending: Radiation Oncology | Admitting: Radiation Oncology

## 2013-12-12 ENCOUNTER — Telehealth: Payer: Self-pay | Admitting: Internal Medicine

## 2013-12-12 ENCOUNTER — Ambulatory Visit (INDEPENDENT_AMBULATORY_CARE_PROVIDER_SITE_OTHER): Payer: 59 | Admitting: Cardiothoracic Surgery

## 2013-12-12 ENCOUNTER — Telehealth: Payer: Self-pay | Admitting: *Deleted

## 2013-12-12 ENCOUNTER — Encounter: Payer: Self-pay | Admitting: Cardiothoracic Surgery

## 2013-12-12 ENCOUNTER — Ambulatory Visit: Payer: 59 | Attending: Internal Medicine | Admitting: Physical Therapy

## 2013-12-12 ENCOUNTER — Encounter: Payer: Self-pay | Admitting: Radiation Oncology

## 2013-12-12 ENCOUNTER — Ambulatory Visit (HOSPITAL_BASED_OUTPATIENT_CLINIC_OR_DEPARTMENT_OTHER): Payer: 59 | Admitting: Internal Medicine

## 2013-12-12 ENCOUNTER — Other Ambulatory Visit: Payer: Self-pay | Admitting: *Deleted

## 2013-12-12 VITALS — BP 141/81 | HR 121 | Temp 98.4°F | Resp 16

## 2013-12-12 VITALS — BP 141/81 | HR 121 | Temp 98.4°F | Resp 22 | Ht 66.0 in | Wt 180.1 lb

## 2013-12-12 DIAGNOSIS — R918 Other nonspecific abnormal finding of lung field: Secondary | ICD-10-CM

## 2013-12-12 DIAGNOSIS — R51 Headache: Secondary | ICD-10-CM

## 2013-12-12 DIAGNOSIS — R5383 Other fatigue: Secondary | ICD-10-CM

## 2013-12-12 DIAGNOSIS — C3490 Malignant neoplasm of unspecified part of unspecified bronchus or lung: Secondary | ICD-10-CM | POA: Insufficient documentation

## 2013-12-12 DIAGNOSIS — R222 Localized swelling, mass and lump, trunk: Secondary | ICD-10-CM

## 2013-12-12 DIAGNOSIS — R05 Cough: Secondary | ICD-10-CM

## 2013-12-12 DIAGNOSIS — C3412 Malignant neoplasm of upper lobe, left bronchus or lung: Secondary | ICD-10-CM | POA: Insufficient documentation

## 2013-12-12 DIAGNOSIS — J90 Pleural effusion, not elsewhere classified: Secondary | ICD-10-CM

## 2013-12-12 DIAGNOSIS — E039 Hypothyroidism, unspecified: Secondary | ICD-10-CM | POA: Insufficient documentation

## 2013-12-12 DIAGNOSIS — R5381 Other malaise: Secondary | ICD-10-CM

## 2013-12-12 DIAGNOSIS — E119 Type 2 diabetes mellitus without complications: Secondary | ICD-10-CM | POA: Insufficient documentation

## 2013-12-12 DIAGNOSIS — R0602 Shortness of breath: Secondary | ICD-10-CM | POA: Insufficient documentation

## 2013-12-12 DIAGNOSIS — I2699 Other pulmonary embolism without acute cor pulmonale: Secondary | ICD-10-CM | POA: Insufficient documentation

## 2013-12-12 DIAGNOSIS — R059 Cough, unspecified: Secondary | ICD-10-CM

## 2013-12-12 DIAGNOSIS — IMO0001 Reserved for inherently not codable concepts without codable children: Secondary | ICD-10-CM | POA: Insufficient documentation

## 2013-12-12 NOTE — Progress Notes (Signed)
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 female who is seen out of the courtesy of Dr. Moreen Fowler as part of the multi-disciplinary thoracic oncology clinic.   the patient presented earlier this year with upper respiratory symptoms. She was initially felt to have sinusitis and was treated for this issue. She however continued to have coughing and was treated with Levaquin. She continued to have cough and respiratory symptoms. A chest CT scan was ordered which is documented below showed a large left hilar and mediastinal mass causing near complete occlusion of the distal left main pulmonary artery. Scans were most compatible with primary lung cancer. In addition there was a possible left adrenal metastasis and a lesion in the left upper lobe measuring 1.2 cm in size suspicious for malignancy. Patient was also noted to have a possible left pericardial metastasis measuring 2.8 x 1 cm.  With this information the patient is now seen for evaluation.Marland Kitchen  PREVIOUS RADIATION THERAPY: No  PAST MEDICAL HISTORY:  has no past medical history on file.    PAST SURGICAL HISTORY:History reviewed. No pertinent past surgical history.  FAMILY HISTORY: family history is not on file.  SOCIAL HISTORY:  reports that she has been smoking Cigarettes.  She has a 12.5 pack-year smoking history. She has never used smokeless tobacco.  ALLERGIES: Actos and Vicodin  MEDICATIONS:  Current Outpatient Prescriptions  Medication Sig Dispense Refill  . atorvastatin (LIPITOR) 40 MG tablet Take 40 mg by mouth daily.      . Canagliflozin (INVOKANA) 300 MG TABS Take 1 tablet  by mouth daily before breakfast.      . chlorpheniramine-HYDROcodone (TUSSIONEX) 10-8 MG/5ML LQCR Take 5 mLs by mouth every 12 (twelve) hours as needed for cough.      . escitalopram (LEXAPRO) 10 MG tablet Take 10 mg by mouth daily.      Marland Kitchen esomeprazole (NEXIUM) 40 MG capsule Take 40 mg by mouth daily at 12 noon.      . fenofibrate 160 MG tablet Take 160 mg by mouth daily.      . fexofenadine (ALLEGRA) 30 MG tablet Take 30 mg by mouth 2 (two) times daily.      . fluticasone (FLONASE) 50 MCG/ACT nasal spray Place 1 spray into both nostrils 2 (two) times daily.      Marland Kitchen glimepiride (AMARYL) 4 MG tablet Take 4 mg by mouth daily with breakfast.      . levofloxacin (LEVAQUIN) 500 MG tablet Take 500 mg by mouth daily.      Marland Kitchen levothyroxine (SYNTHROID, LEVOTHROID) 150 MCG tablet Take 150 mcg by mouth daily before breakfast.      . LOSARTAN POTASSIUM PO Take 50 mg by mouth daily.      Marland Kitchen oxyCODONE-acetaminophen (PERCOCET/ROXICET) 5-325 MG per tablet Take by mouth every 4 (four) hours as needed for severe pain.      . sitaGLIPtin-metformin (JANUMET) 50-1000 MG per tablet Take 1 tablet by mouth 2 (two) times daily with a meal.       No current facility-administered medications for this encounter.    REVIEW OF SYSTEMS:  A 15 point review of systems is documented in  the electronic medical record. This was obtained by the nursing staff. However, I reviewed this with the patient to discuss relevant findings and make appropriate changes.  She has had some more problems with headaches recently. An MRI of the brain will be ordered for staging purposes and to evaluate this issue. She denies any hemoptysis. She continues to have dyspnea with exertion and some chest tightness. She denies any new bony pain. Her appetite is somewhat depressed. 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   PHYSICAL EXAM:  In Gen. this is a very pleasant 58 year old female in no acute distress. She is accompanied by  her husband and 2 daughters on evaluation today. Examination of the pupils reveals them to be equal round and reactive to light. The extraocular eye movements are intact. The tongue is midline. There is no secondary infection noted the oral cavity or posterior pharynx. The neck supraclavicular and axillary areas are free of adenopathy. Examination of the lungs reveals them to be clear. The heart has a regular rhythm and rate. The abdomen is soft and nontender with normal bowel sounds. On neurological examination motor strength is 5 out of 5 in the proximal and distal muscle groups of the upper and lower extremities.   ECOG = 1   1 - Symptomatic but completely ambulatory (Restricted in physically strenuous activity but ambulatory and able to carry out work of a light or sedentary nature. For example, light housework, office work)  LABORATORY DATA:  Lab Results  Component Value Date   HGB 13.3 02/03/2009   HCT 38.3 02/03/2009   Lab Results  Component Value Date   NA 139 02/03/2009   K 4.2 02/03/2009   CL 106 02/03/2009   CO2 26 02/03/2009   GLUCOSE 134* 02/03/2009   CREATININE 0.83 02/03/2009   CALCIUM 9.4 02/03/2009      RADIOGRAPHY: Ct Angio Chest Pe W/cm &/or Wo Cm  12/09/2013   CLINICAL DATA:  Chest pain and severe cough. Pulmonary embolism in 1980. Ex-smoker.  BUN and creatinine were obtained on site at [facility] at  [address]  Results:  BUN 14 mg/dL,  Creatinine 0.6 mg/dL.  EXAM: CT ANGIOGRAPHY CHEST WITH CONTRAST  TECHNIQUE: Multidetector CT imaging of the chest was performed using the standard protocol during bolus administration of intravenous contrast. Multiplanar CT image reconstructions and MIPs were obtained to evaluate the vascular anatomy.  CONTRAST:  138mL OMNIPAQUE IOHEXOL 350 MG/ML SOLN  COMPARISON:  Chest radiographs dated 02/03/2009 and chest CT dated 05/20/2007.  FINDINGS: Large mass centered at the left hilum and invading the adjacent mediastinum, including the AP  window. This mass measures 8.9 x 5.6 cm on image number 44 of series 4 and 6.8 cm in length on image number 50 of series 600. This is compressing and causing almost complete occlusion of the distal left main pulmonary artery with lack of opacification of the majority of the left pulmonary artery branches.  There is also a new spiculated nodule in the left upper lobe measuring 1.2 cm in maximum diameter on image number 27 of series 5 which is also a new. There is also a new 4 mm subpleural nodule in the left upper lobe on image number 27 of series 5.  Also noted are multiple enlarged mediastinal lymph nodes. The majority of these are in the AP window, including a node with a short axis diameter of 13.5 mm on image number 45. A proximal  right hilar node has a short axis diameter of 10 mm on image number 44.  There is an interval oval, mass-like area of in the pericardium on the left, measuring 2.8 x 1.0 cm on image number 69. There is also a small pericardial effusion with a maximum thickness of 7 mm.  There are multiple areas of patchy density in the left upper lobe, lingula and left lower lobe. There is also an interval 3 mm subpleural nodule in the left lower lobe on image number 89, at the inferior aspect of somewhat nodular patchy opacity.  A small left pleural effusion is noted. On the last image, there is a suggestion of a partially imaged left adrenal mass, measuring 1.3 x 0.9 cm. The more inferior portion of the left adrenal gland is not included and the majority of the right adrenal gland is not included.  The remainder of the lungs are mildly hyperexpanded with mildly prominent interstitial markings. Diffuse peribronchial thickening is also noted.  The pulmonary arteries on the right are normally opacified with no pulmonary emboli seen.  Mild diffuse low density of the liver is noted in the upper abdomen. Thoracic spine degenerative changes are noted.  Review of the MIP images confirms the above findings.   IMPRESSION: 1. 8.9 x 6.8 x 5.6 cm left hilar and mediastinal mass causing almost complete occlusion of the distal left main pulmonary artery with lack of opacification of the majority of the pulmonary arteries on the left. 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 2. Mediastinal and right hilar metastatic adenopathy. 3. Possible left adrenal metastasis. 4. 1.2 cm spiculated left upper lobe nodule, concerning for a primary lung carcinoma. 5. 4 mm left upper lobe subpleural nodules suspicious for a metastasis. 6. Possible left pericardial metastasis measuring 2.8 x 1.0 cm. 7. Patchy opacities most likely representing areas of pulmonary infarction in the left upper lobe, lingula and left lower lobe. An area in the left lower lobe has nodular components. 8. Small left pleural effusion. 9. Small pericardial effusion. 10. COPD. 11. Mild diffuse hepatic steatosis.  These results were called by telephone at the time of interpretation on 12/09/2013 at 12:56 PM to Dr. Antony Contras , who verbally acknowledged these results.   Electronically Signed   By: Enrique Sack M.D.   On: 12/09/2013 12:30   Mm Screening Breast Tomo Bilateral  12/05/2013   CLINICAL DATA:  Screening.  EXAM: DIGITAL SCREENING BILATERAL MAMMOGRAM WITH 3D TOMO WITH CAD  COMPARISON:  Previous exam(s).  ACR Breast Density Category b: There are scattered areas of fibroglandular density.  FINDINGS: There are no findings suspicious for malignancy. Images were processed with CAD.  IMPRESSION: No mammographic evidence of malignancy. A result letter of this screening mammogram will be mailed directly to the patient.  RECOMMENDATION: Screening mammogram in one year. (Code:SM-B-01Y)  BI-RADS CATEGORY  1: Negative.   Electronically Signed   By: Lovey Newcomer M.D.   On: 12/05/2013 11:00      IMPRESSION: Probable advanced lung cancer. X-ray images are suspicious for small cell lung cancer. Patient will undergo biopsy in the  near future.  PLAN: Final management details are pending results of the patient's biopsy. She will likely require a short course of radiation therapy to the central chest if she has small cell lung cancer to facilitate rapid tumor shrinkage.  Total time spent in this encounter including medical records evaluation, x-ray review, examination,  patient counseling and coordination of  care was 60 minutes. ------------------------------------------------  Blair Promise, PhD, MD

## 2013-12-12 NOTE — Progress Notes (Signed)
[address]       [place] [address]             [phone]                    Carol Bond  Medical Record #[id number] Date of Birth: 1956-06-17  Referring: Gara Kroner, MD Primary Care: Namon Cirri  Chief Complaint:   Lung Mass  History of Present Illness:    Carol Bond 58 y.o. female is seen in the office  today for three month history of cough and SOB. She has 40 pack year of smoking, quiting about 9 months ago. No hemoptysis. After treatment with antibiotics and steroids and no symptom improvement. CTA of chest done to ro PE. Large central lung mass noted.       Current Activity/ Functional Status:  Patient is independent with mobility/ambulation, transfers, ADL's, IADL's.   Zubrod Score: At the time of surgery this patient's most appropriate activity status/level should be described as: [x]     0    Normal activity, no symptoms []     1    Restricted in physical strenuous activity but ambulatory, able to do out light work []     2    Ambulatory and capable of self care, unable to do work activities, up and about               >50 % of waking hours                              []     3    Only limited self care, in bed greater than 50% of waking hours []     4    Completely disabled, no self care, confined to bed or chair []     5    Moribund   Past Medical History  Diagnosis Date  . Pulmonary embolism   . Hypothyroid   . Diabetes    Past Surgical History  Procedure Laterality Date  . Abdominal hysterectomy    . Spine surgery    . Cervical fusion       Family History  Problem Relation Age of Onset  . Colon cancer Mother   . Liver cancer Father     History   Social History  . Marital Status: Married    Spouse Name: N/A    Number of Children: N/A  . Years of Education: N/A   Occupational History  . Not on file.   Social History Main Topics  . Smoking status: Current Every Day Smoker -- 0.50  packs/day for 25 years    Types: Cigarettes  . Smokeless tobacco: Never Used  . Alcohol Use: Not on file  . Drug Use: Not on file  . Sexual Activity: Not on file   Other Topics Concern  . Not on file   Social History Narrative  . No narrative on file    History  Smoking status  . Current Every Day Smoker -- 0.50 packs/day for 25 years  . Types: Cigarettes  Smokeless tobacco  . Never Used    History  Alcohol Use: Not on file     Allergies  Allergen Reactions  . Actos [Pioglitazone] Swelling  . Vicodin [Hydrocodone-Acetaminophen] Itching    Current Outpatient Prescriptions  Medication Sig Dispense Refill  . atorvastatin (LIPITOR) 40 MG tablet Take 40 mg by mouth daily.      Marland Kitchen  Canagliflozin (INVOKANA) 300 MG TABS Take 1 tablet by mouth daily before breakfast.      . chlorpheniramine-HYDROcodone (TUSSIONEX) 10-8 MG/5ML LQCR Take 5 mLs by mouth every 12 (twelve) hours as needed for cough.      . escitalopram (LEXAPRO) 10 MG tablet Take 10 mg by mouth daily.      Marland Kitchen esomeprazole (NEXIUM) 40 MG capsule Take 40 mg by mouth daily at 12 noon.      . fenofibrate 160 MG tablet Take 160 mg by mouth daily.      . fexofenadine (ALLEGRA) 30 MG tablet Take 30 mg by mouth 2 (two) times daily.      . fluticasone (FLONASE) 50 MCG/ACT nasal spray Place 1 spray into both nostrils 2 (two) times daily.      Marland Kitchen glimepiride (AMARYL) 4 MG tablet Take 4 mg by mouth daily with breakfast.      . levofloxacin (LEVAQUIN) 500 MG tablet Take 500 mg by mouth daily.      Marland Kitchen levothyroxine (SYNTHROID, LEVOTHROID) 150 MCG tablet Take 150 mcg by mouth daily before breakfast.      . LOSARTAN POTASSIUM PO Take 50 mg by mouth daily.      Marland Kitchen oxyCODONE-acetaminophen (PERCOCET/ROXICET) 5-325 MG per tablet Take by mouth every 4 (four) hours as needed for severe pain.      . sitaGLIPtin-metformin (JANUMET) 50-1000 MG per tablet Take 1 tablet by mouth 2 (two) times daily with a meal.       No current  facility-administered medications for this visit.     Review of Systems:     Cardiac Review of Systems: Y or N  Chest Pain [  y  ]  Resting SOB [  n ] Exertional SOB  [  y]  Orthopnea [ n ]   Pedal Edema [ n  ]    Palpitations [ n ] Syncope  [n  ]   Presyncope [  n ]  General Review of Systems: [Y] = yes [  ]=no Constitional: recent weight change [ ny ];  Wt loss over the last 3 months [   ] anorexia [  ]; fatigue [  ]; nausea [  ]; night sweats [  ]; fever [  ]; or chills [  ];          Dental: poor dentition[  ]; Last Dentist visit:   Eye : blurred vision [  ]; diplopia [   ]; vision changes [  ];  Amaurosis fugax[  ]; Resp: cough [  ];  wheezing[  y];  hemoptysis[y  ]; shortness of breath[y  ]; paroxysmal nocturnal dyspnea[  ]; dyspnea on exertion[y  ]; or orthopnea[  ];  GI:  gallstones[  ], vomiting[  ];  dysphagia[  ]; melena[  ];  hematochezia [  ]; heartburn[  ];   Hx of  Colonoscopy[  ]; GU: kidney stones [  ]; hematuria[  ];   dysuria [  ];  nocturia[  ];  history of     obstruction [  ]; urinary frequency [  ]             Skin: rash, swelling[  ];, hair loss[  ];  peripheral edema[  ];  or itching[  ]; Musculosketetal: myalgias[  ];  joint swelling[  ];  joint erythema[  ];  joint pain[  ];  back pain[  ];  Heme/Lymph: bruising[  ];  bleeding[  ];  anemia[  ];  Neuro: TIA[  ];  headaches[  ];  stroke[  ];  vertigo[  ];  seizures[ n ];   paresthesias[  ];  difficulty walking[  ];  Psych:depression[y  ]; anxiety[ y ];  Endocrine: diabetes[  ];  thyroid dysfunction[  ];  Immunizations: Flu up to date [  ]; Pneumococcal up to date [  ];  Other:  Physical Exam: BP 141/81  Pulse 121  Temp(Src) 98.4 F (36.9 C)  Resp 16  SpO2 97%  PHYSICAL EXAMINATION:  General appearance: alert, cooperative, appears stated age and no distress Neurologic: intact Heart: regular rate and rhythm, S1, S2 normal, no murmur, click, rub or gallop Lungs: diminished breath sounds bibasilar Abdomen:  soft, non-tender; bowel sounds normal; no masses,  no organomegaly Extremities: extremities normal, atraumatic, no cyanosis or edema and Homans sign is negative, no sign of DVT No cervical adenopathy  Diagnostic Studies & Laboratory data:     Recent Radiology Findings:   Ct Angio Chest Pe W/cm &/or Wo Cm  12/09/2013   CLINICAL DATA:  Chest pain and severe cough. Pulmonary embolism in 1980. Ex-smoker.  BUN and creatinine were obtained on site at [facility] at  [address]  Results:  BUN 14 mg/dL,  Creatinine 0.6 mg/dL.  EXAM: CT ANGIOGRAPHY CHEST WITH CONTRAST  TECHNIQUE: Multidetector CT imaging of the chest was performed using the standard protocol during bolus administration of intravenous contrast. Multiplanar CT image reconstructions and MIPs were obtained to evaluate the vascular anatomy.  CONTRAST:  168mL OMNIPAQUE IOHEXOL 350 MG/ML SOLN  COMPARISON:  Chest radiographs dated 02/03/2009 and chest CT dated 05/20/2007.  FINDINGS: Large mass centered at the left hilum and invading the adjacent mediastinum, including the AP window. This mass measures 8.9 x 5.6 cm on image number 44 of series 4 and 6.8 cm in length on image number 50 of series 600. This is compressing and causing almost complete occlusion of the distal left main pulmonary artery with lack of opacification of the majority of the left pulmonary artery branches.  There is also a new spiculated nodule in the left upper lobe measuring 1.2 cm in maximum diameter on image number 27 of series 5 which is also a new. There is also a new 4 mm subpleural nodule in the left upper lobe on image number 27 of series 5.  Also noted are multiple enlarged mediastinal lymph nodes. The majority of these are in the AP window, including a node with a short axis diameter of 13.5 mm on image number 45. A proximal right hilar node has a short axis diameter of 10 mm on image number 44.  There is an interval oval, mass-like area of in the pericardium  on the left, measuring 2.8 x 1.0 cm on image number 69. There is also a small pericardial effusion with a maximum thickness of 7 mm.  There are multiple areas of patchy density in the left upper lobe, lingula and left lower lobe. There is also an interval 3 mm subpleural nodule in the left lower lobe on image number 89, at the inferior aspect of somewhat nodular patchy opacity.  A small left pleural effusion is noted. On the last image, there is a suggestion of a partially imaged left adrenal mass, measuring 1.3 x 0.9 cm. The more inferior portion of the left adrenal gland is not included and the majority of the right adrenal gland is not included.  The remainder of the lungs are mildly hyperexpanded with mildly  prominent interstitial markings. Diffuse peribronchial thickening is also noted.  The pulmonary arteries on the right are normally opacified with no pulmonary emboli seen.  Mild diffuse low density of the liver is noted in the upper abdomen. Thoracic spine degenerative changes are noted.  Review of the MIP images confirms the above findings.  IMPRESSION: 1. 8.9 x 6.8 x 5.6 cm left hilar and mediastinal mass causing almost complete occlusion of the distal left main pulmonary artery with lack of opacification of the majority of the pulmonary arteries on the left. 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 2. Mediastinal and right hilar metastatic adenopathy. 3. Possible left adrenal metastasis. 4. 1.2 cm spiculated left upper lobe nodule, concerning for a primary lung carcinoma. 5. 4 mm left upper lobe subpleural nodules suspicious for a metastasis. 6. Possible left pericardial metastasis measuring 2.8 x 1.0 cm. 7. Patchy opacities most likely representing areas of pulmonary infarction in the left upper lobe, lingula and left lower lobe. An area in the left lower lobe has nodular components. 8. Small left pleural effusion. 9. Small pericardial effusion. 10. COPD.  11. Mild diffuse hepatic steatosis.  These results were called by telephone at the time of interpretation on 12/09/2013 at 12:56 PM to Dr. Antony Contras , who verbally acknowledged these results.   Electronically Signed   By: Enrique Sack M.D.   On: 12/09/2013 12:30   Mm Screening Breast Tomo Bilateral  12/05/2013   CLINICAL DATA:  Screening.  EXAM: DIGITAL SCREENING BILATERAL MAMMOGRAM WITH 3D TOMO WITH CAD  COMPARISON:  Previous exam(s).  ACR Breast Density Category b: There are scattered areas of fibroglandular density.  FINDINGS: There are no findings suspicious for malignancy. Images were processed with CAD.  IMPRESSION: No mammographic evidence of malignancy. A result letter of this screening mammogram will be mailed directly to the patient.  RECOMMENDATION: Screening mammogram in one year. (Code:SM-B-01Y)  BI-RADS CATEGORY  1: Negative.   Electronically Signed   By: Lovey Newcomer M.D.   On: 12/05/2013 11:00      Recent Lab Findings: Lab Results  Component Value Date   HGB 13.3 02/03/2009   HCT 38.3 02/03/2009   GLUCOSE 134* 02/03/2009   NA 139 02/03/2009   K 4.2 02/03/2009   CL 106 02/03/2009   CREATININE 0.83 02/03/2009   BUN 19 02/03/2009   CO2 26 02/03/2009      Assessment / Plan:   Extensive mediastinal involvement of lung mass poss small cell lung cancer recommended to patient proceeding with bronchoscopy, EBUS with biopsy and poss mediastinoscopy and placement of portacath. Will plan for tomorrow. Risks and options discussed.         I spent 55 minutes counseling the patient face to face. The total time spent in the appointment was 80 minutes.  Grace Isaac MD      [address] [place] [address] Office [phone]   Beeper [phone]  12/12/2013 9:38 PM

## 2013-12-12 NOTE — Progress Notes (Signed)
I was unable to reach patient by phone.  I left  A message on voice mail.  I instructed the patient to arrive at [facility] entrance at 5:30   , nothing to eat or drink after midnight.   I instructed the patient to take the following medications in the am with just enough water to get them down: Lexapro, Nexium, Allerga, Synthroid. Oxycodone if needed. I asked patient to not wear any lotions, powders, cologne, jewelry, piercing, make-up or nail polish, do not shave.  I asked the patient to call [phone], in the am if there were any questions or problems.

## 2013-12-12 NOTE — Telephone Encounter (Signed)
gv and printed appt sched and avs for pt for April  °

## 2013-12-12 NOTE — Telephone Encounter (Signed)
Called pt to update from thoracic conference.  Let vm message to call me with my phone number

## 2013-12-12 NOTE — Progress Notes (Signed)
[place] Telephone:([phone]) [phone]   [phone] Multidisciplinary thoracic oncology clinic ([facility])  CONSULT NOTE  REFERRING PHYSICIAN: Baruch Goldmann, PA  REASON FOR CONSULTATION:  58 years old white female with large lung mass suspicious for lung cancer.  HPI Carol Bond is a 58 y.o. female was past medical history significant for GERD, hypothyroidism, diabetes mellitus, right lower extremity deep venous thrombosis and pulmonary emboli in 1980, anxiety, chronic back pain as well as dyslipidemia and long history of smoking but quit 9 months ago. The patient mentions that she has been complaining of cough, chest congestion as well as headache and facial pain for several weeks. She was seen by her primary care physician assistant and was treated for sinusitis with a course of Augmentin with some initial improvement. She continues to have headache and persistent cough. She was treated with a course of prednisone but her condition was getting worse. CT angiogram of the chest was performed on 12/09/2013. It showed Large mass centered at the left hilum and invading the adjacent mediastinum, including the AP window. This mass measures 8.9 x 5.6 x 6.8 CM. This is compressing and causing almost complete occlusion of the distal left main pulmonary artery with  lack of opacification of the majority of the left pulmonary artery branches. There is also a new spiculated nodule in the left upper lobe measuring 1.2 cm in maximum diameter which is also a new. There is also a new 4 mm subpleural nodule in the left upper lobe. Also noted are multiple enlarged mediastinal lymph nodes. The majority of these are in the AP window, including a node with a short axis diameter of 13.5 mm. A proximal right hilar node has a short axis diameter of 10 mm. There is an interval oval, mass-like area of in the pericardium on the left, measuring 2.8 x 1.0 cm. There is also a small pericardial effusion with  a maximum thickness of 7 mm. There are multiple areas of patchy density in the left upper lobe,  lingula and left lower lobe. There is also an interval 3 mm subpleural nodule in the left lower lobe, at the inferior aspect of somewhat nodular patchy opacity. A small left pleural effusion is noted. On the last image, there is a suggestion of a partially imaged left adrenal mass, measuring 1.3 x 0.9 cm. The more inferior portion of the left adrenal gland is not included and the majority of the right adrenal gland is not included. The patient was referred to me today for further evaluation and recommendation regarding her condition. When seen today she continues to complain of shortness of breath at baseline and increased with exertion as well as chest pain especially on breathing. She also has fatigue and mild cough with no hemoptysis. She lost around 11 pounds in the last 3 weeks and she always have increased sweats. She also has headaches that was getting worse in the last 2 weeks.  Her family history significant for a father who died from liver cancer at age 62, mother died from brain cancer at age 73, sister had uterine cancer at age 98 and another sister with breast cancer at age 52. The patient is married and has 2 daughters. She was accompanied today by her husband Carol Bond and her 2 daughters Carol Bond and Carol Bond. The patient works as a Network engineer. She has a history of smoking one pack per day for around 40 years and quit 9 months ago. She drinks alcohol occasionally with  no history of drug abuse.   HPI  Past Medical History  Diagnosis Date  . Pulmonary embolism   . Hypothyroid   . Diabetes     No past surgical history on file.  Her family history significant for a father who died from liver cancer at age 105, mother died from brain cancer at age 7, sister had uterine cancer at age 66 and another sister with breast cancer at age 55.  Social History History  Substance Use Topics  . Smoking  status: Current Every Day Smoker -- 0.50 packs/day for 25 years    Types: Cigarettes  . Smokeless tobacco: Never Used  . Alcohol Use: Not on file    Allergies  Allergen Reactions  . Actos [Pioglitazone] Swelling  . Vicodin [Hydrocodone-Acetaminophen] Itching    Current Outpatient Prescriptions  Medication Sig Dispense Refill  . atorvastatin (LIPITOR) 40 MG tablet Take 40 mg by mouth daily.      . Canagliflozin (INVOKANA) 300 MG TABS Take 1 tablet by mouth daily before breakfast.      . chlorpheniramine-HYDROcodone (TUSSIONEX) 10-8 MG/5ML LQCR Take 5 mLs by mouth every 12 (twelve) hours as needed for cough.      . escitalopram (LEXAPRO) 10 MG tablet Take 10 mg by mouth daily.      Marland Kitchen esomeprazole (NEXIUM) 40 MG capsule Take 40 mg by mouth daily at 12 noon.      . fenofibrate 160 MG tablet Take 160 mg by mouth daily.      . fexofenadine (ALLEGRA) 30 MG tablet Take 30 mg by mouth 2 (two) times daily.      . fluticasone (FLONASE) 50 MCG/ACT nasal spray Place 1 spray into both nostrils 2 (two) times daily.      Marland Kitchen glimepiride (AMARYL) 4 MG tablet Take 4 mg by mouth daily with breakfast.      . levofloxacin (LEVAQUIN) 500 MG tablet Take 500 mg by mouth daily.      Marland Kitchen levothyroxine (SYNTHROID, LEVOTHROID) 150 MCG tablet Take 150 mcg by mouth daily before breakfast.      . LOSARTAN POTASSIUM PO Take 50 mg by mouth daily.      Marland Kitchen oxyCODONE-acetaminophen (PERCOCET/ROXICET) 5-325 MG per tablet Take by mouth every 4 (four) hours as needed for severe pain.      . sitaGLIPtin-metformin (JANUMET) 50-1000 MG per tablet Take 1 tablet by mouth 2 (two) times daily with a meal.       No current facility-administered medications for this visit.    Review of Systems  Constitutional: positive for anorexia, fatigue and weight loss Eyes: negative Ears, nose, mouth, throat, and face: negative Respiratory: positive for cough and dyspnea on exertion Cardiovascular: negative Gastrointestinal:  negative Genitourinary:negative Integument/breast: negative Hematologic/lymphatic: negative Musculoskeletal:negative Neurological: negative Behavioral/Psych: negative Endocrine: negative Allergic/Immunologic: negative  Physical Exam  TIW:PYKDX, healthy, no distress, well nourished, well developed and anxious SKIN: skin color, texture, turgor are normal, no rashes or significant lesions HEAD: Normocephalic, No masses, lesions, tenderness or abnormalities EYES: normal, PERRLA EARS: External ears normal, Canals clear OROPHARYNX:no exudate, no erythema and lips, buccal mucosa, and tongue normal  NECK: supple, no adenopathy, no JVD LYMPH:  no palpable lymphadenopathy, no hepatosplenomegaly BREAST:not examined LUNGS: clear to auscultation , and palpation HEART: regular rate & rhythm, no murmurs and no gallops ABDOMEN:abdomen soft, non-tender, normal bowel sounds and no masses or organomegaly BACK: no curvature to forward bending EXTREMITIES:no joint deformities, effusion, or inflammation, no edema, no skin discoloration  NEURO: alert & oriented  x 3 with fluent speech, no focal motor/sensory deficits  PERFORMANCE STATUS: ECOG 1  LABORATORY DATA: Lab Results  Component Value Date   HGB 13.3 02/03/2009   HCT 38.3 02/03/2009      Chemistry      Component Value Date/Time   NA 139 02/03/2009 0625   K 4.2 02/03/2009 0625   CL 106 02/03/2009 0625   CO2 26 02/03/2009 0625   BUN 19 02/03/2009 0625   CREATININE 0.83 02/03/2009 0625      Component Value Date/Time   CALCIUM 9.4 02/03/2009 0625       RADIOGRAPHIC STUDIES: Ct Angio Chest Pe W/cm &/or Wo Cm  12/09/2013   CLINICAL DATA:  Chest pain and severe cough. Pulmonary embolism in 1980. Ex-smoker.  BUN and creatinine were obtained on site at [facility] at  [address]  Results:  BUN 14 mg/dL,  Creatinine 0.6 mg/dL.  EXAM: CT ANGIOGRAPHY CHEST WITH CONTRAST  TECHNIQUE: Multidetector CT imaging of the chest was performed using the  standard protocol during bolus administration of intravenous contrast. Multiplanar CT image reconstructions and MIPs were obtained to evaluate the vascular anatomy.  CONTRAST:  183mL OMNIPAQUE IOHEXOL 350 MG/ML SOLN  COMPARISON:  Chest radiographs dated 02/03/2009 and chest CT dated 05/20/2007.  FINDINGS: Large mass centered at the left hilum and invading the adjacent mediastinum, including the AP window. This mass measures 8.9 x 5.6 cm on image number 44 of series 4 and 6.8 cm in length on image number 50 of series 600. This is compressing and causing almost complete occlusion of the distal left main pulmonary artery with lack of opacification of the majority of the left pulmonary artery branches.  There is also a new spiculated nodule in the left upper lobe measuring 1.2 cm in maximum diameter on image number 27 of series 5 which is also a new. There is also a new 4 mm subpleural nodule in the left upper lobe on image number 27 of series 5.  Also noted are multiple enlarged mediastinal lymph nodes. The majority of these are in the AP window, including a node with a short axis diameter of 13.5 mm on image number 45. A proximal right hilar node has a short axis diameter of 10 mm on image number 44.  There is an interval oval, mass-like area of in the pericardium on the left, measuring 2.8 x 1.0 cm on image number 69. There is also a small pericardial effusion with a maximum thickness of 7 mm.  There are multiple areas of patchy density in the left upper lobe, lingula and left lower lobe. There is also an interval 3 mm subpleural nodule in the left lower lobe on image number 89, at the inferior aspect of somewhat nodular patchy opacity.  A small left pleural effusion is noted. On the last image, there is a suggestion of a partially imaged left adrenal mass, measuring 1.3 x 0.9 cm. The more inferior portion of the left adrenal gland is not included and the majority of the right adrenal gland is not included.  The  remainder of the lungs are mildly hyperexpanded with mildly prominent interstitial markings. Diffuse peribronchial thickening is also noted.  The pulmonary arteries on the right are normally opacified with no pulmonary emboli seen.  Mild diffuse low density of the liver is noted in the upper abdomen. Thoracic spine degenerative changes are noted.  Review of the MIP images confirms the above findings.  IMPRESSION: 1. 8.9 x 6.8 x  5.6 cm left hilar and mediastinal mass causing almost complete occlusion of the distal left main pulmonary artery with lack of opacification of the majority of the pulmonary arteries on the left. 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 2. Mediastinal and right hilar metastatic adenopathy. 3. Possible left adrenal metastasis. 4. 1.2 cm spiculated left upper lobe nodule, concerning for a primary lung carcinoma. 5. 4 mm left upper lobe subpleural nodules suspicious for a metastasis. 6. Possible left pericardial metastasis measuring 2.8 x 1.0 cm. 7. Patchy opacities most likely representing areas of pulmonary infarction in the left upper lobe, lingula and left lower lobe. An area in the left lower lobe has nodular components. 8. Small left pleural effusion. 9. Small pericardial effusion. 10. COPD. 11. Mild diffuse hepatic steatosis.  These results were called by telephone at the time of interpretation on 12/09/2013 at 12:56 PM to Dr. Antony Contras , who verbally acknowledged these results.   Electronically Signed   By: Enrique Sack M.D.   On: 12/09/2013 12:30   Mm Screening Breast Tomo Bilateral  12/05/2013   CLINICAL DATA:  Screening.  EXAM: DIGITAL SCREENING BILATERAL MAMMOGRAM WITH 3D TOMO WITH CAD  COMPARISON:  Previous exam(s).  ACR Breast Density Category b: There are scattered areas of fibroglandular density.  FINDINGS: There are no findings suspicious for malignancy. Images were processed with CAD.  IMPRESSION: No mammographic evidence of  malignancy. A result letter of this screening mammogram will be mailed directly to the patient.  RECOMMENDATION: Screening mammogram in one year. (Code:SM-B-01Y)  BI-RADS CATEGORY  1: Negative.   Electronically Signed   By: Lovey Newcomer M.D.   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   PLAN: I had a lengthy discussion with the patient and her family today about her current disease status and further investigation to confirm diagnosis as well as treatment options.  I recommended for the patient to see Dr. Servando Snare later today for evaluation and consideration of bronchoscopy as well as endobronchial ultrasound for tissue diagnosis. I will complete the staging workup by ordering MRI of the brain as well as PET scan. The patient will be seen later today by Dr. Sondra Come for consideration of starting a course of palliative radiotherapy to the large left hilar and mediastinal mass for local control of her disease. Once the tissue diagnosis is confirmed, I will discuss with the patient and her systemic treatment options in more details. I will arrange for her to come back for follow up visit in one week for evaluation and discussion of her treatment options based on the imaging and biopsy results. The patient was seen during the multidisciplinary thoracic oncology clinic today by medical oncology, radiation oncology, thoracic surgery, physical therapist, thoracic navigator as well as Education officer, museum. She was advised to call immediately if she has any concerning symptoms in the interval. The patient voices understanding of current disease status and treatment options and is in agreement with the current care plan.  All questions were answered. The patient knows to call the clinic with any problems, questions or concerns. We  can certainly see the patient much sooner if necessary.  Thank you so much for allowing me to participate in the care of Carol Bond. I will continue to follow up the  patient with you and assist in her care.  I spent 40 minutes counseling the patient face to face. The total time spent in the appointment was 60 minutes.  Disclaimer: This note was dictated with voice recognition software. Similar sounding words can inadvertently be transcribed and may not be corrected upon review.    Curt Bears 12/12/2013, 2:52 PM

## 2013-12-12 NOTE — Progress Notes (Signed)
   Thoracic Treatment Summary Name:Carol Bond Date:12/12/2013 DOB:[birth year] Your Medical Team Medical Oncologist: Dr. Julien Nordmann Radiation Oncologist: Dr. Sondra Come Pulmonologist: Surgeon: Dr. Servando Snare  Type and Stage of Lung Cancer  Clinical Stage: Stage IV or Extensive Stage  Clinical stage is based on radiology exams.  Pathological stage will be determined after surgery.  Staging is based on the size of the tumor, involvement of lymph nodes or not, and whether or not the cancer center has spread. Recommendations Recommendations: Tissue diagnosis, port placement, chemotherapy, and radiation therapy  These recommendations are based on information available as of today's consult.  This is subject to change depending further testing or exams. Next Steps Next Step: 1. Scheduled for tissue biopsy for tomorrow 2. Medical Oncology will set up schedule chemo class and chemotherapy 3. Radiation Oncology will set up appointments for radiation therapy  Barriers to Care What do you perceive as a potential barrier that may prevent you from receiving your treatment plan? Nothing perceived at this time Resources given: NCI booklet on lung cancer Resource at [facility] family support and classes Questions [name], RN BSN Thoracic Oncology Nurse Navigator at [facility] is a nurse navigator that is available to assist you through your cancer journey.  She can answer your questions and/or provide resources regarding your treatment plan, emotional support, or financial concerns.

## 2013-12-13 ENCOUNTER — Telehealth: Payer: Self-pay | Admitting: *Deleted

## 2013-12-13 ENCOUNTER — Encounter (HOSPITAL_COMMUNITY): Payer: 59 | Admitting: Certified Registered Nurse Anesthetist

## 2013-12-13 ENCOUNTER — Ambulatory Visit (HOSPITAL_COMMUNITY): Payer: 59 | Admitting: Certified Registered Nurse Anesthetist

## 2013-12-13 ENCOUNTER — Encounter: Payer: Self-pay | Admitting: Internal Medicine

## 2013-12-13 ENCOUNTER — Ambulatory Visit (HOSPITAL_COMMUNITY): Payer: 59

## 2013-12-13 ENCOUNTER — Encounter (HOSPITAL_COMMUNITY): Payer: Self-pay | Admitting: *Deleted

## 2013-12-13 ENCOUNTER — Encounter (HOSPITAL_COMMUNITY)
Admission: RE | Disposition: A | Discharge status: Home / Self Care | Payer: Self-pay | Source: Ambulatory Visit | Attending: Cardiothoracic Surgery

## 2013-12-13 ENCOUNTER — Ambulatory Visit (HOSPITAL_COMMUNITY)
Admission: RE | Admit: 2013-12-13 | Discharge: 2013-12-13 | Disposition: A | Discharge status: Home / Self Care | Payer: 59 | Source: Ambulatory Visit | Attending: Cardiothoracic Surgery | Admitting: Cardiothoracic Surgery

## 2013-12-13 DIAGNOSIS — C3490 Malignant neoplasm of unspecified part of unspecified bronchus or lung: Secondary | ICD-10-CM | POA: Insufficient documentation

## 2013-12-13 DIAGNOSIS — E039 Hypothyroidism, unspecified: Secondary | ICD-10-CM | POA: Insufficient documentation

## 2013-12-13 DIAGNOSIS — Z87891 Personal history of nicotine dependence: Secondary | ICD-10-CM | POA: Insufficient documentation

## 2013-12-13 DIAGNOSIS — C3412 Malignant neoplasm of upper lobe, left bronchus or lung: Secondary | ICD-10-CM | POA: Diagnosis present

## 2013-12-13 DIAGNOSIS — R222 Localized swelling, mass and lump, trunk: Secondary | ICD-10-CM

## 2013-12-13 DIAGNOSIS — Z86711 Personal history of pulmonary embolism: Secondary | ICD-10-CM | POA: Insufficient documentation

## 2013-12-13 DIAGNOSIS — I10 Essential (primary) hypertension: Secondary | ICD-10-CM | POA: Insufficient documentation

## 2013-12-13 DIAGNOSIS — E119 Type 2 diabetes mellitus without complications: Secondary | ICD-10-CM | POA: Insufficient documentation

## 2013-12-13 HISTORY — PX: VIDEO BRONCHOSCOPY WITH ENDOBRONCHIAL ULTRASOUND: SHX6177

## 2013-12-13 HISTORY — PX: PORTACATH PLACEMENT: SHX2246

## 2013-12-13 LAB — GLUCOSE, CAPILLARY
Glucose-Capillary: 210 mg/dL — ABNORMAL HIGH (ref 70–99)
Glucose-Capillary: 162 mg/dL — ABNORMAL HIGH (ref 70–99)
Glucose-Capillary: 139 mg/dL — ABNORMAL HIGH (ref 70–99)

## 2013-12-13 LAB — COMPREHENSIVE METABOLIC PANEL
ALT: 42 U/L — ABNORMAL HIGH (ref 0–35)
AST: 25 U/L (ref 0–37)
Albumin: 4.1 g/dL (ref 3.5–5.2)
Alkaline Phosphatase: 56 U/L (ref 39–117)
BUN: 27 mg/dL — ABNORMAL HIGH (ref 6–23)
CO2: 23 mEq/L (ref 19–32)
Calcium: 10.2 mg/dL (ref 8.4–10.5)
Chloride: 102 mEq/L (ref 96–112)
Creatinine, Ser: 0.65 mg/dL (ref 0.50–1.10)
GFR calc Af Amer: >90 mL/min (ref >90)
GFR calc non Af Amer: >90 mL/min (ref >90)
Glucose, Bld: 195 mg/dL — ABNORMAL HIGH (ref 70–99)
Potassium: 4.5 mEq/L (ref 3.7–5.3)
Sodium: 140 mEq/L (ref 137–147)
Total Bilirubin: 0.2 mg/dL — ABNORMAL LOW (ref 0.3–1.2)
Total Protein: 7.4 g/dL (ref 6.0–8.3)

## 2013-12-13 LAB — CBC
HCT: 39.7 % (ref 36.0–46.0)
Hemoglobin: 12.6 g/dL (ref 12.0–15.0)
MCH: 28.8 pg (ref 26.0–34.0)
MCHC: 31.7 g/dL (ref 30.0–36.0)
MCV: 90.6 fL (ref 78.0–100.0)
Platelets: 311 10*3/uL (ref 150–400)
RBC: 4.38 MIL/uL (ref 3.87–5.11)
RDW: 14.4 % (ref 11.5–15.5)
WBC: 5.9 10*3/uL (ref 4.0–10.5)

## 2013-12-13 LAB — SURGICAL PCR SCREEN
MRSA, PCR: NEGATIVE
STAPHYLOCOCCUS AUREUS: NEGATIVE

## 2013-12-13 LAB — TYPE AND SCREEN
ABO/RH(D): A POS
Antibody Screen: NEGATIVE

## 2013-12-13 LAB — APTT: aPTT: 27 seconds (ref 24–37)

## 2013-12-13 LAB — PROTIME-INR
INR: 0.92 (ref 0.00–1.49)
Prothrombin Time: 12.2 seconds (ref 11.6–15.2)

## 2013-12-13 LAB — ABO/RH: ABO/RH(D): A POS

## 2013-12-13 SURGERY — BRONCHOSCOPY, WITH EBUS
Anesthesia: General

## 2013-12-13 MED ORDER — ROCURONIUM BROMIDE 100 MG/10ML IV SOLN
INTRAVENOUS | Status: DC | PRN
  Administered 2013-12-13: 20 mg via INTRAVENOUS
  Administered 2013-12-13: 10 mg via INTRAVENOUS
  Administered 2013-12-13: 40 mg via INTRAVENOUS
  Administered 2013-12-13 (×2): 10 mg via INTRAVENOUS

## 2013-12-13 MED ORDER — MIDAZOLAM HCL 5 MG/5ML IJ SOLN
INTRAMUSCULAR | Status: DC | PRN
  Administered 2013-12-13: 2 mg via INTRAVENOUS

## 2013-12-13 MED ORDER — METOCLOPRAMIDE HCL 5 MG/ML IJ SOLN
INTRAMUSCULAR | Status: DC | PRN
  Administered 2013-12-13: 10 mg via INTRAVENOUS

## 2013-12-13 MED ORDER — STERILE WATER FOR INJECTION IJ SOLN
INTRAMUSCULAR | Status: AC
  Filled 2013-12-13: qty 10

## 2013-12-13 MED ORDER — CHLORHEXIDINE GLUCONATE CLOTH 2 % EX PADS: 6.0000 | MEDICATED_PAD | Freq: Once | CUTANEOUS | Status: DC

## 2013-12-13 MED ORDER — LIDOCAINE HCL (CARDIAC) 20 MG/ML IV SOLN
INTRAVENOUS | Status: DC | PRN
  Administered 2013-12-13: 100 mg via INTRAVENOUS

## 2013-12-13 MED ORDER — METOCLOPRAMIDE HCL 5 MG/ML IJ SOLN
INTRAMUSCULAR | Status: AC
  Filled 2013-12-13: qty 2

## 2013-12-13 MED ORDER — MUPIROCIN 2 % EX OINT
TOPICAL_OINTMENT | CUTANEOUS | Status: AC
  Administered 2013-12-13: 1
  Filled 2013-12-13: qty 22

## 2013-12-13 MED ORDER — GLYCOPYRROLATE 0.2 MG/ML IJ SOLN
INTRAMUSCULAR | Status: DC | PRN
  Administered 2013-12-13: 0.4 mg via INTRAVENOUS

## 2013-12-13 MED ORDER — SODIUM CHLORIDE 0.9 % IR SOLN
Status: DC | PRN
  Administered 2013-12-13: 1000 mL

## 2013-12-13 MED ORDER — SODIUM CHLORIDE 0.9 % IR SOLN
Status: DC | PRN
  Administered 2013-12-13: 09:00:00

## 2013-12-13 MED ORDER — ARTIFICIAL TEARS OP OINT
TOPICAL_OINTMENT | OPHTHALMIC | Status: DC | PRN
  Administered 2013-12-13: 1 via OPHTHALMIC

## 2013-12-13 MED ORDER — DEXTROSE 5 % IV SOLN
1.5000 g | INTRAVENOUS | Status: AC
  Administered 2013-12-13: 1.5 g via INTRAVENOUS
  Filled 2013-12-13: qty 1.5

## 2013-12-13 MED ORDER — HEPARIN SOD (PORK) LOCK FLUSH 100 UNIT/ML IV SOLN
INTRAVENOUS | Status: AC
  Filled 2013-12-13: qty 5

## 2013-12-13 MED ORDER — PHENYLEPHRINE HCL 10 MG/ML IJ SOLN
INTRAMUSCULAR | Status: DC | PRN
  Administered 2013-12-13: 80 ug via INTRAVENOUS
  Administered 2013-12-13: 120 ug via INTRAVENOUS
  Administered 2013-12-13: 80 ug via INTRAVENOUS
  Administered 2013-12-13: 120 ug via INTRAVENOUS

## 2013-12-13 MED ORDER — ONDANSETRON HCL 4 MG/2ML IJ SOLN
INTRAMUSCULAR | Status: DC | PRN
  Administered 2013-12-13: 4 mg via INTRAVENOUS

## 2013-12-13 MED ORDER — HYDROMORPHONE HCL PF 1 MG/ML IJ SOLN
INTRAMUSCULAR | Status: AC
  Filled 2013-12-13: qty 1

## 2013-12-13 MED ORDER — ONDANSETRON HCL 4 MG/2ML IJ SOLN: 4.0000 mg | Freq: Once | INTRAMUSCULAR | Status: DC | PRN

## 2013-12-13 MED ORDER — MIDAZOLAM HCL 2 MG/2ML IJ SOLN
INTRAMUSCULAR | Status: AC
  Filled 2013-12-13: qty 2

## 2013-12-13 MED ORDER — LIDOCAINE HCL (PF) 1 % IJ SOLN
INTRAMUSCULAR | Status: DC | PRN
  Administered 2013-12-13: 20 mL

## 2013-12-13 MED ORDER — PROPOFOL 10 MG/ML IV BOLUS
INTRAVENOUS | Status: DC | PRN
  Administered 2013-12-13: 30 mg via INTRAVENOUS
  Administered 2013-12-13: 50 mg via INTRAVENOUS
  Administered 2013-12-13: 20 mg via INTRAVENOUS
  Administered 2013-12-13: 30 mg via INTRAVENOUS
  Administered 2013-12-13: 170 mg via INTRAVENOUS
  Administered 2013-12-13: 30 mg via INTRAVENOUS

## 2013-12-13 MED ORDER — OXYCODONE HCL 5 MG PO TABS: 5.0000 mg | ORAL_TABLET | ORAL | Status: DC | PRN

## 2013-12-13 MED ORDER — LACTATED RINGERS IV SOLN
INTRAVENOUS | Status: DC | PRN
  Administered 2013-12-13 (×2): via INTRAVENOUS

## 2013-12-13 MED ORDER — EPINEPHRINE HCL 1 MG/ML IJ SOLN
INTRAMUSCULAR | Status: AC
  Filled 2013-12-13: qty 1

## 2013-12-13 MED ORDER — PROPOFOL 10 MG/ML IV BOLUS
INTRAVENOUS | Status: AC
  Filled 2013-12-13: qty 20

## 2013-12-13 MED ORDER — EPHEDRINE SULFATE 50 MG/ML IJ SOLN
INTRAMUSCULAR | Status: AC
  Filled 2013-12-13: qty 1

## 2013-12-13 MED ORDER — LIDOCAINE HCL (PF) 1 % IJ SOLN
INTRAMUSCULAR | Status: AC
  Filled 2013-12-13: qty 30

## 2013-12-13 MED ORDER — HEPARIN SOD (PORK) LOCK FLUSH 100 UNIT/ML IV SOLN
INTRAVENOUS | Status: DC | PRN
  Administered 2013-12-13: 250 [IU] via INTRAVENOUS

## 2013-12-13 MED ORDER — HYDROMORPHONE HCL PF 1 MG/ML IJ SOLN
0.2500 mg | INTRAMUSCULAR | Status: DC | PRN
  Administered 2013-12-13: 0.25 mg via INTRAVENOUS

## 2013-12-13 MED ORDER — SODIUM CHLORIDE 0.9 % IV SOLN
10.0000 mg | INTRAVENOUS | Status: DC | PRN
  Administered 2013-12-13: 20 ug/min via INTRAVENOUS

## 2013-12-13 MED ORDER — MUPIROCIN 2 % EX OINT
TOPICAL_OINTMENT | Freq: Two times a day (BID) | CUTANEOUS | Status: DC
  Filled 2013-12-13: qty 22

## 2013-12-13 MED ORDER — FENTANYL CITRATE 0.05 MG/ML IJ SOLN
INTRAMUSCULAR | Status: DC | PRN
Start: 2013-12-13 — End: 2013-12-13
  Administered 2013-12-13 (×2): 100 ug via INTRAVENOUS

## 2013-12-13 MED ORDER — FENTANYL CITRATE 0.05 MG/ML IJ SOLN
INTRAMUSCULAR | Status: AC
  Filled 2013-12-13: qty 5

## 2013-12-13 MED ORDER — NEOSTIGMINE METHYLSULFATE 1 MG/ML IJ SOLN
INTRAMUSCULAR | Status: DC | PRN
  Administered 2013-12-13: 3 mg via INTRAVENOUS

## 2013-12-13 MED ORDER — EPHEDRINE SULFATE 50 MG/ML IJ SOLN
INTRAMUSCULAR | Status: DC | PRN
  Administered 2013-12-13: 10 mg via INTRAVENOUS
  Administered 2013-12-13: 5 mg via INTRAVENOUS

## 2013-12-13 SURGICAL SUPPLY — 71 items
ADH SKN CLS APL DERMABOND .7 (GAUZE/BANDAGES/DRESSINGS) ×2
BAG DECANTER FOR FLEXI CONT (MISCELLANEOUS) ×3 IMPLANT
BALL CTTN LRG ABS STRL LF (GAUZE/BANDAGES/DRESSINGS)
BLADE SURG 10 STRL SS (BLADE) ×3 IMPLANT
BLADE SURG 11 STRL SS (BLADE) ×3 IMPLANT
BRUSH CYTOL CELLEBRITY 1.5X140 (MISCELLANEOUS) ×1 IMPLANT
CANISTER SUCTION 2500CC (MISCELLANEOUS) ×6 IMPLANT
CLIP TI MEDIUM 6 (CLIP) IMPLANT
CONT SPEC 4OZ CLIKSEAL STRL BL (MISCELLANEOUS) ×7 IMPLANT
COTTONBALL LRG STERILE PKG (GAUZE/BANDAGES/DRESSINGS) IMPLANT
COVER PROBE W GEL 5X96 (DRAPES) ×1 IMPLANT
COVER SURGICAL LIGHT HANDLE (MISCELLANEOUS) ×5 IMPLANT
COVER TABLE BACK 60X90 (DRAPES) ×3 IMPLANT
DERMABOND ADVANCED (GAUZE/BANDAGES/DRESSINGS) ×1
DERMABOND ADVANCED .7 DNX12 (GAUZE/BANDAGES/DRESSINGS) ×2 IMPLANT
DRAPE C-ARM 42X72 X-RAY (DRAPES) ×3 IMPLANT
DRAPE CHEST BREAST 15X10 FENES (DRAPES) ×3 IMPLANT
DRAPE LAPAROTOMY T 102X78X121 (DRAPES) ×2 IMPLANT
DRSG AQUACEL AG ADV 3.5X14 (GAUZE/BANDAGES/DRESSINGS) ×2 IMPLANT
ELECT CAUTERY BLADE 6.4 (BLADE) ×3 IMPLANT
ELECT REM PT RETURN 9FT ADLT (ELECTROSURGICAL) ×3
ELECTRODE REM PT RTRN 9FT ADLT (ELECTROSURGICAL) ×2 IMPLANT
FORCEPS BIOP RJ4 1.8 (CUTTING FORCEPS) ×1 IMPLANT
GAUZE SPONGE 4X4 16PLY XRAY LF (GAUZE/BANDAGES/DRESSINGS) ×2 IMPLANT
GLOVE BIO SURGEON STRL SZ 6.5 (GLOVE) ×9 IMPLANT
GOWN STRL REUS W/ TWL LRG LVL3 (GOWN DISPOSABLE) ×4 IMPLANT
GOWN STRL REUS W/TWL LRG LVL3 (GOWN DISPOSABLE) ×6
GUIDEWIRE UNCOATED ST S 7038 (WIRE) IMPLANT
HEMOSTAT SURGICEL 2X14 (HEMOSTASIS) IMPLANT
INTRODUCER 13FR (MISCELLANEOUS) IMPLANT
INTRODUCER COOK 11FR (CATHETERS) IMPLANT
KIT BASIN OR (CUSTOM PROCEDURE TRAY) ×3 IMPLANT
KIT PORT POWER 9.6FR MRI PREA (Catheter) ×1 IMPLANT
KIT PORT POWER ISP 8FR (Catheter) IMPLANT
KIT POWER CATH 8FR (Catheter) IMPLANT
KIT ROOM TURNOVER OR (KITS) ×6 IMPLANT
MARKER SKIN DUAL TIP RULER LAB (MISCELLANEOUS) ×3 IMPLANT
NDL BIOPSY TRANSBRONCH 21G (NEEDLE) IMPLANT
NDL BLUNT 18X1 FOR OR ONLY (NEEDLE) IMPLANT
NDL HYPO 25GX1X1/2 BEV (NEEDLE) ×2 IMPLANT
NEEDLE 22X1 1/2 (OR ONLY) (NEEDLE) ×1 IMPLANT
NEEDLE BIOPSY TRANSBRONCH 21G (NEEDLE) IMPLANT
NEEDLE BLUNT 18X1 FOR OR ONLY (NEEDLE) IMPLANT
NEEDLE HYPO 25GX1X1/2 BEV (NEEDLE) ×3 IMPLANT
NEEDLE SYS SONOTIP II EBUSTBNA (NEEDLE) ×3 IMPLANT
NS IRRIG 1000ML POUR BTL (IV SOLUTION) ×6 IMPLANT
OIL SILICONE PENTAX (PARTS (SERVICE/REPAIRS)) ×3 IMPLANT
PACK GENERAL/GYN (CUSTOM PROCEDURE TRAY) ×3 IMPLANT
PACK SURGICAL SETUP 50X90 (CUSTOM PROCEDURE TRAY) ×3 IMPLANT
PAD ARMBOARD 7.5X6 YLW CONV (MISCELLANEOUS) ×12 IMPLANT
PENCIL BUTTON HOLSTER BLD 10FT (ELECTRODE) ×3 IMPLANT
SET SHEATH INTRODUCER 10FR (MISCELLANEOUS) IMPLANT
SPONGE GAUZE 4X4 12PLY (GAUZE/BANDAGES/DRESSINGS) ×4 IMPLANT
SPONGE INTESTINAL PEANUT (DISPOSABLE) IMPLANT
SPONGE LAP 18X18 X RAY DECT (DISPOSABLE) ×1 IMPLANT
STAPLER VISISTAT 35W (STAPLE) IMPLANT
SUT SILK 2 0 SH (SUTURE) ×3 IMPLANT
SUT VIC AB 3-0 SH 18 (SUTURE) ×3 IMPLANT
SUT VICRYL 4-0 PS2 18IN ABS (SUTURE) ×3 IMPLANT
SWAB COLLECTION DEVICE MRSA (MISCELLANEOUS) IMPLANT
SYR 20CC LL (SYRINGE) ×6 IMPLANT
SYR 20ML ECCENTRIC (SYRINGE) ×3 IMPLANT
SYR 5ML LUER SLIP (SYRINGE) ×3 IMPLANT
SYR CONTROL 10ML LL (SYRINGE) ×3 IMPLANT
SYRINGE 10CC LL (SYRINGE) ×3 IMPLANT
TOWEL OR 17X24 6PK STRL BLUE (TOWEL DISPOSABLE) ×6 IMPLANT
TOWEL OR 17X26 10 PK STRL BLUE (TOWEL DISPOSABLE) ×3 IMPLANT
TRAP SPECIMEN MUCOUS 40CC (MISCELLANEOUS) ×3 IMPLANT
TUBE ANAEROBIC SPECIMEN COL (MISCELLANEOUS) IMPLANT
TUBE CONNECTING 12X1/4 (SUCTIONS) ×7 IMPLANT
WATER STERILE IRR 1000ML POUR (IV SOLUTION) ×3 IMPLANT

## 2013-12-13 NOTE — Progress Notes (Signed)
12/13/13 0645  OBSTRUCTIVE SLEEP APNEA  Have you ever been diagnosed with sleep apnea through a sleep study? No  Do you snore loudly (loud enough to be heard through closed doors)?  1  Do you often feel tired, fatigued, or sleepy during the daytime? 1  Has anyone observed you stop breathing during your sleep? 1  Do you have, or are you being treated for high blood pressure? 1  BMI more than 35 kg/m2? 0  Age over 56 years old? 1  Neck circumference greater than 40 cm/18 inches? 0  Gender: 0  Obstructive Sleep Apnea Score 5  Score 4 or greater  Results sent to PCP

## 2013-12-13 NOTE — Transfer of Care (Signed)
Immediate Anesthesia Transfer of Care Note  Patient: Carol Bond  Procedure(s) Performed: Procedure(s): VIDEO BRONCHOSCOPY WITH ENDOBRONCHIAL ULTRASOUND (N/A) INSERTION PORT-A-CATH (Bilateral)  Patient Location: PACU  Anesthesia Type:General  Level of Consciousness: awake, alert , oriented and patient cooperative  Airway & Oxygen Therapy: Patient Spontanous Breathing and Patient connected to face mask oxygen  Post-op Assessment: Report given to PACU RN, Post -op Vital signs reviewed and stable and Patient moving all extremities X 4  Post vital signs: Reviewed and stable  Complications: No apparent anesthesia complications

## 2013-12-13 NOTE — Progress Notes (Signed)
Pt going to xray with [facility].

## 2013-12-13 NOTE — Discharge Instructions (Signed)
Flexible Bronchoscopy, Care After Refer to this sheet in the next few weeks. These instructions provide you with information on caring for yourself after your procedure. Your health care provider may also give you more specific instructions. Your treatment has been planned according to current medical practices, but problems sometimes occur. Call your health care provider if you have any problems or questions after your procedure.  WHAT TO EXPECT AFTER THE PROCEDURE It is normal to have the following symptoms for 24 48 hours after the procedure:   Increased cough.  Low-grade fever.  Sore throat or hoarse voice.  Small streaks of blood in your thick spit (sputum), if tissue samples were taken (biopsy). HOME CARE INSTRUCTIONS   Do not eat or drink anything for 2 hours after your procedure. Your nose and throat were numbed by medicine. If you try to eat or drink before the medicine wears off, food or drink could go into your lungs or you could burn yourself. After the numbness is gone and your cough and gag reflexes have returned, you may eat soft food and drink liquids slowly.   The day after the procedure, you can go back to your normal diet.   You may resume normal activities.   Follow up with your health care provider as directed. It is important to keep all follow-up appointments, especially if tissue samples were taken for testing (biopsy). SEEK IMMEDIATE MEDICAL CARE IF:   You have increasing shortness of breath.   You become lightheaded or faint.   You have chest pain.   You have any new concerning symptoms.  You cough up more than a small amount of blood.  The amount of blood you cough up increases. MAKE SURE YOU:  Understand these instructions.  Will watch your condition.  Will get help right away if you are not doing well or get worse. Document Released: 03/04/2005 Document Revised: 06/05/2013 Document Reviewed: 04/19/2013 [facility] Patient Information 2014  [place]. Implanted Port Insertion, Care After Refer to this sheet in the next few weeks. These instructions provide you with information on caring for yourself after your procedure. Your health care provider may also give you more specific instructions. Your treatment has been planned according to current medical practices, but problems sometimes occur. Call your health care provider if you have any problems or questions after your procedure. WHAT TO EXPECT AFTER THE PROCEDURE After your procedure, it is typical to have the following:   Discomfort at the port insertion site. Ice packs to the area will help.  Bruising on the skin over the port. This will subside in 3 4 days. HOME CARE INSTRUCTIONS  After your port is placed, you will get a manufacturer's information card. The card has information about your port. Keep this card with you at all times.   Know what kind of port you have. There are many types of ports available.   Wear a medical alert bracelet in case of an emergency. This can help alert health care workers that you have a port.   The port can stay in for as long as your health care provider believes it is necessary.   A home health care nurse may give medicines and take care of the port.   You or a family member can get special training and directions for giving medicine and taking care of the port at home.  SEEK MEDICAL CARE IF:  Your port does not flush or you are unable to get a blood return.  SEEK IMMEDIATE MEDICAL CARE IF:  You have new fluid or pus coming from your incision.   You notice a bad smell coming from your incision site.   You have swelling, pain, or more redness at the incision or port site.   You have a fever or chills.   You have chest pain or shortness of breath. Document Released: 06/05/2013 Document Reviewed: 04/22/2013 [facility] Patient Information 2014 [place], [place].  What to eat:  For your first meals, you should eat  lightly; only small meals initially.  If you do not have nausea, you may eat larger meals.  Avoid spicy, greasy and heavy food.    General Anesthesia, Adult, Care After  Refer to this sheet in the next few weeks. These instructions provide you with information on caring for yourself after your procedure. Your health care provider may also give you more specific instructions. Your treatment has been planned according to current medical practices, but problems sometimes occur. Call your health care provider if you have any problems or questions after your procedure.  WHAT TO EXPECT AFTER THE PROCEDURE  After the procedure, it is typical to experience:  Sleepiness.  Nausea and vomiting. HOME CARE INSTRUCTIONS  For the first 24 hours after general anesthesia:  Have a responsible person with you.  Do not drive a car. If you are alone, do not take public transportation.  Do not drink alcohol.  Do not take medicine that has not been prescribed by your health care provider.  Do not sign important papers or make important decisions.  You may resume a normal diet and activities as directed by your health care provider.  Change bandages (dressings) as directed.  If you have questions or problems that seem related to general anesthesia, call the hospital and ask for the anesthetist or anesthesiologist on call. SEEK MEDICAL CARE IF:  You have nausea and vomiting that continue the day after anesthesia.  You develop a rash. SEEK IMMEDIATE MEDICAL CARE IF:  You have difficulty breathing.  You have chest pain.  You have any allergic problems. Document Released: 11/21/2000 Document Revised: 04/17/2013 Document Reviewed: 02/28/2013  [facility] Patient Information 2014 [place], [place].

## 2013-12-13 NOTE — Anesthesia Postprocedure Evaluation (Signed)
  Anesthesia Post-op Note  Patient: Carol Bond  Procedure(s) Performed: Procedure(s): VIDEO BRONCHOSCOPY WITH ENDOBRONCHIAL ULTRASOUND (N/A) INSERTION PORT-A-CATH (Bilateral)  Patient Location: PACU  Anesthesia Type:General  Level of Consciousness: awake, oriented, sedated and patient cooperative  Airway and Oxygen Therapy: Patient Spontanous Breathing  Post-op Pain: mild  Post-op Assessment: Post-op Vital signs reviewed, Patient's Cardiovascular Status Stable, Respiratory Function Stable, Patent Airway and No signs of Nausea or vomiting  Post-op Vital Signs: stable  Last Vitals:  Filed Vitals:   12/13/13 1007  BP: 143/85  Pulse: 85  Temp: 36.7 C  Resp: 16    Complications: No apparent anesthesia complications

## 2013-12-13 NOTE — Progress Notes (Signed)
Received report from [facility]. Resuming care as primary RN

## 2013-12-13 NOTE — Progress Notes (Signed)
Pt returning from xray

## 2013-12-13 NOTE — Anesthesia Preprocedure Evaluation (Addendum)
Anesthesia Evaluation  Patient identified by MRN, date of birth, ID band Patient awake    Reviewed: Allergy & Precautions, H&P , NPO status , Patient's Chart, lab work & pertinent test results  History of Anesthesia Complications Negative for: history of anesthetic complications  Airway Mallampati: II TM Distance: >3 FB Neck ROM: Full    Dental  (+) Teeth Intact, Dental Advisory Given   Pulmonary former smoker,          Cardiovascular hypertension, Pt. on medications     Neuro/Psych History of neck and lumbar fusions    GI/Hepatic   Endo/Other  diabetes, Type 2, Oral Hypoglycemic AgentsHypothyroidism   Renal/GU      Musculoskeletal   Abdominal   Peds  Hematology   Anesthesia Other Findings   Reproductive/Obstetrics                          Anesthesia Physical Anesthesia Plan  ASA: III  Anesthesia Plan: General   Post-op Pain Management:    Induction: Intravenous  Airway Management Planned: Oral ETT  Additional Equipment:   Intra-op Plan:   Post-operative Plan: Extubation in OR  Informed Consent: I have reviewed the patients History and Physical, chart, labs and discussed the procedure including the risks, benefits and alternatives for the proposed anesthesia with the patient or authorized representative who has indicated his/her understanding and acceptance.     Plan Discussed with:   Anesthesia Plan Comments:         Anesthesia Quick Evaluation

## 2013-12-13 NOTE — Progress Notes (Signed)
Put patient's and daughter's fmla form on nurse's desk.

## 2013-12-13 NOTE — OR Nursing (Signed)
End time for Bronch/EBUS -0900

## 2013-12-13 NOTE — Progress Notes (Signed)
Clinical Network engineer met with Patient, husband and two daughters to offer support and assess for psychological needs.  The Patient reported she was interested in speaking with CSW about a Living Will.  CSWI told Patient she will forward request to [place]. Patient stated she had good support with family.  CSWI shared with Patient the CSW role and opportunities for additional support.  Patient voiced understanding and agrees to seek out further assistance if needed.  Tremaine Earwood S. [place] Work Intern [employer] [phone]

## 2013-12-13 NOTE — Progress Notes (Signed)
Report to [facility] for relief

## 2013-12-13 NOTE — Brief Op Note (Signed)
      [address]       [place] [address]             [phone]      12/13/2013  10:21 AM  PATIENT:  Carol Bond  58 y.o. female  PRE-OPERATIVE DIAGNOSIS:  LUNG MASS POST-OPERATIVE DIAGNOSIS:  LUNG MASS, PROCEDURE:  Procedure(s): VIDEO BRONCHOSCOPY WITH ENDOBRONCHIAL ULTRASOUND (N/A) INSERTION PORT-A-CATH left with sonosite and fluro guidance Transbronchial biopsy Endo bronchial biopsy  SURGEON:  Surgeon(s) and Role:    * Grace Isaac, MD - Primary    ANESTHESIA:   general  EBL:  Total I/O In: 1500 [I.V.:1500] Out: 20 [Blood:20]  BLOOD ADMINISTERED:none  DRAINS: none   LOCAL MEDICATIONS USED:  LIDOCAINE   SPECIMEN:  Source of Specimen:  #7 nodes and left upper lobe  DISPOSITION OF SPECIMEN:  PATHOLOGY  COUNTS:  YES   DICTATION: .Dragon Dictation  PLAN OF CARE: Discharge to home after PACU  PATIENT DISPOSITION:  PACU - hemodynamically stable.   Delay start of Pharmacological VTE agent (>24hrs) due to surgical blood loss or risk of bleeding: yes

## 2013-12-13 NOTE — Telephone Encounter (Signed)
Called pt and left vm follow up from [facility] 12/12/13.  I know she was getting her tissue dx today and wanted to check on her.  I left my name and phone number

## 2013-12-13 NOTE — H&P (Signed)
[address]       [place] [address]             [phone]                      Carol Bond [place] Medical Record #[id number] Date of Birth: [birth year]  Referring: Gara Kroner, MD Primary Care: Namon Cirri  Chief Complaint:   Lung Mass  History of Present Illness:    Carol Bond 58 y.o. female is seen in the office  today for three month history of cough and SOB. She has 40 pack year of smoking, quiting about 9 months ago. No hemoptysis. After treatment with antibiotics and steroids and no symptom improvement. CTA of chest done to ro PE. Large central lung mass noted.      Current Activity/ Functional Status:  Patient is independent with mobility/ambulation, transfers, ADL's, IADL's.   Zubrod Score: At the time of surgery this patient's most appropriate activity status/level should be described as: [x]     0    Normal activity, no symptoms []     1    Restricted in physical strenuous activity but ambulatory, able to do out light work []     2    Ambulatory and capable of self care, unable to do work activities, up and about               >50 % of waking hours                              []     3    Only limited self care, in bed greater than 50% of waking hours []     4    Completely disabled, no self care, confined to bed or chair []     5    Moribund   Past Medical History  Diagnosis Date  . Pulmonary embolism   . Hypothyroid   . Diabetes    Past Surgical History  Procedure Laterality Date  . Abdominal hysterectomy    . Spine surgery    . Cervical fusion       Family History  Problem Relation Age of Onset  . Colon cancer Mother   . Liver cancer Father     History   Social History  . Marital Status: Married    Spouse Name: N/A    Number of Children: N/A  . Years of Education: N/A   Occupational History  . Not on file.   Social History Main Topics  . Smoking status: Current Every Day Smoker -- 0.50  packs/day for 25 years    Types: Cigarettes  . Smokeless tobacco: Never Used  . Alcohol Use: Not on file  . Drug Use: Not on file  . Sexual Activity: Not on file      History  Smoking status  . Current Every Day Smoker -- 0.50 packs/day for 25 years  . Types: Cigarettes  Smokeless tobacco  . Never Used    History  Alcohol Use: Not on file     Allergies  Allergen Reactions  . Actos [Pioglitazone] Swelling  . Vicodin [Hydrocodone-Acetaminophen] Itching    Current Facility-Administered Medications  Medication Dose Route Frequency Provider Last Rate Last Dose  . cefUROXime (ZINACEF) 1.5 g in dextrose 5 % 50 mL IVPB  1.5 g Intravenous 60 min Pre-Op Grace Isaac, MD      .  Chlorhexidine Gluconate Cloth 2 % PADS 6 each  6 each Topical Once Grace Isaac, MD      . mupirocin ointment (BACTROBAN) 2 %   Nasal BID Grace Isaac, MD         Review of Systems:     Cardiac Review of Systems: Y or N  Chest Pain [  y  ]  Resting SOB [  n ] Exertional SOB  [  y]  Orthopnea [ n ]   Pedal Edema [ n  ]    Palpitations [ n ] Syncope  [n  ]   Presyncope [  n ]  General Review of Systems: [Y] = yes [  ]=no Constitional: recent weight change [ ny ];  Wt loss over the last 3 months [   ] anorexia [  ]; fatigue [  ]; nausea [  ]; night sweats [  ]; fever [  ]; or chills [  ];          Dental: poor dentition[  ]; Last Dentist visit:   Eye : blurred vision [  ]; diplopia [   ]; vision changes [  ];  Amaurosis fugax[  ]; Resp: cough [  ];  wheezing[  y];  hemoptysis[y  ]; shortness of breath[y  ]; paroxysmal nocturnal dyspnea[  ]; dyspnea on exertion[y  ]; or orthopnea[  ];  GI:  gallstones[  ], vomiting[  ];  dysphagia[  ]; melena[  ];  hematochezia [  ]; heartburn[  ];   Hx of  Colonoscopy[  ]; GU: kidney stones [  ]; hematuria[  ];   dysuria [  ];  nocturia[  ];  history of     obstruction [  ]; urinary frequency [  ]             Skin: rash, swelling[  ];, hair loss[  ];  peripheral  edema[  ];  or itching[  ]; Musculosketetal: myalgias[  ];  joint swelling[  ];  joint erythema[  ];  joint pain[  ];  back pain[  ];  Heme/Lymph: bruising[  ];  bleeding[  ];  anemia[  ];  Neuro: TIA[  ];  headaches[  ];  stroke[  ];  vertigo[  ];  seizures[ n ];   paresthesias[  ];  difficulty walking[  ];  Psych:depression[y  ]; anxiety[ y ];  Endocrine: diabetes[  ];  thyroid dysfunction[  ];  Immunizations: Flu up to date [  ]; Pneumococcal up to date [  ];  Other:  Physical Exam: BP 142/85  Pulse 87  Temp(Src) 98.6 F (37 C) (Oral)  Resp 18  Wt 180 lb (81.647 kg)  SpO2 96%  PHYSICAL EXAMINATION:  General appearance: alert, cooperative, appears stated age and no distress Neurologic: intact Heart: regular rate and rhythm, S1, S2 normal, no murmur, click, rub or gallop Lungs: diminished breath sounds bibasilar Abdomen: soft, non-tender; bowel sounds normal; no masses,  no organomegaly Extremities: extremities normal, atraumatic, no cyanosis or edema and Homans sign is negative, no sign of DVT No cervical adenopathy  Diagnostic Studies & Laboratory data:     Recent Radiology Findings:   Ct Angio Chest Pe W/cm &/or Wo Cm  12/09/2013   CLINICAL DATA:  Chest pain and severe cough. Pulmonary embolism in 1980. Ex-smoker.  BUN and creatinine were obtained on site at [facility] at  [address]  Results:  BUN 14 mg/dL,  Creatinine  0.6 mg/dL.  EXAM: CT ANGIOGRAPHY CHEST WITH CONTRAST  TECHNIQUE: Multidetector CT imaging of the chest was performed using the standard protocol during bolus administration of intravenous contrast. Multiplanar CT image reconstructions and MIPs were obtained to evaluate the vascular anatomy.  CONTRAST:  171mL OMNIPAQUE IOHEXOL 350 MG/ML SOLN  COMPARISON:  Chest radiographs dated 02/03/2009 and chest CT dated 05/20/2007.  FINDINGS: Large mass centered at the left hilum and invading the adjacent mediastinum, including the AP window. This mass  measures 8.9 x 5.6 cm on image number 44 of series 4 and 6.8 cm in length on image number 50 of series 600. This is compressing and causing almost complete occlusion of the distal left main pulmonary artery with lack of opacification of the majority of the left pulmonary artery branches.  There is also a new spiculated nodule in the left upper lobe measuring 1.2 cm in maximum diameter on image number 27 of series 5 which is also a new. There is also a new 4 mm subpleural nodule in the left upper lobe on image number 27 of series 5.  Also noted are multiple enlarged mediastinal lymph nodes. The majority of these are in the AP window, including a node with a short axis diameter of 13.5 mm on image number 45. A proximal right hilar node has a short axis diameter of 10 mm on image number 44.  There is an interval oval, mass-like area of in the pericardium on the left, measuring 2.8 x 1.0 cm on image number 69. There is also a small pericardial effusion with a maximum thickness of 7 mm.  There are multiple areas of patchy density in the left upper lobe, lingula and left lower lobe. There is also an interval 3 mm subpleural nodule in the left lower lobe on image number 89, at the inferior aspect of somewhat nodular patchy opacity.  A small left pleural effusion is noted. On the last image, there is a suggestion of a partially imaged left adrenal mass, measuring 1.3 x 0.9 cm. The more inferior portion of the left adrenal gland is not included and the majority of the right adrenal gland is not included.  The remainder of the lungs are mildly hyperexpanded with mildly prominent interstitial markings. Diffuse peribronchial thickening is also noted.  The pulmonary arteries on the right are normally opacified with no pulmonary emboli seen.  Mild diffuse low density of the liver is noted in the upper abdomen. Thoracic spine degenerative changes are noted.  Review of the MIP images confirms the above findings.  IMPRESSION: 1. 8.9  x 6.8 x 5.6 cm left hilar and mediastinal mass causing almost complete occlusion of the distal left main pulmonary artery with lack of opacification of the majority of the pulmonary arteries on the left. 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 2. Mediastinal and right hilar metastatic adenopathy. 3. Possible left adrenal metastasis. 4. 1.2 cm spiculated left upper lobe nodule, concerning for a primary lung carcinoma. 5. 4 mm left upper lobe subpleural nodules suspicious for a metastasis. 6. Possible left pericardial metastasis measuring 2.8 x 1.0 cm. 7. Patchy opacities most likely representing areas of pulmonary infarction in the left upper lobe, lingula and left lower lobe. An area in the left lower lobe has nodular components. 8. Small left pleural effusion. 9. Small pericardial effusion. 10. COPD. 11. Mild diffuse hepatic steatosis.  These results were called by telephone at the time of interpretation on 12/09/2013 at  12:56 PM to Dr. Antony Contras , who verbally acknowledged these results.   Electronically Signed   By: Enrique Sack M.D.   On: 12/09/2013 12:30   Mm Screening Breast Tomo Bilateral  12/05/2013   CLINICAL DATA:  Screening.  EXAM: DIGITAL SCREENING BILATERAL MAMMOGRAM WITH 3D TOMO WITH CAD  COMPARISON:  Previous exam(s).  ACR Breast Density Category b: There are scattered areas of fibroglandular density.  FINDINGS: There are no findings suspicious for malignancy. Images were processed with CAD.  IMPRESSION: No mammographic evidence of malignancy. A result letter of this screening mammogram will be mailed directly to the patient.  RECOMMENDATION: Screening mammogram in one year. (Code:SM-B-01Y)  BI-RADS CATEGORY  1: Negative.   Electronically Signed   By: Lovey Newcomer M.D.   On: 12/05/2013 11:00      Recent Lab Findings: Lab Results  Component Value Date   WBC 5.9 12/13/2013   HGB 12.6 12/13/2013   HCT 39.7 12/13/2013   PLT 311 12/13/2013    GLUCOSE 134* 02/03/2009   NA 139 02/03/2009   K 4.2 02/03/2009   CL 106 02/03/2009   CREATININE 0.83 02/03/2009   BUN 19 02/03/2009   CO2 26 02/03/2009   INR 0.92 12/13/2013      Assessment / Plan:   Extensive mediastinal involvement of lung mass poss small cell lung cancer  Will proceed with bronchoscopy, EBUS with biopsy and poss mediastinoscopy and placement of portacath.    The goals risks and alternatives of the planned surgical procedure bronchoscopy, EBUS with biopsy and poss mediastinoscopy and placement of portacath have been discussed with the patient in detail. The risks of the procedure including death, infection, stroke, myocardial infarction, bleeding, blood transfusion, pneumthorax have all been discussed specifically.  I have quoted Carol Bond a 2 % of perioperative mortality and a complication rate as high as 15 %. The patient's questions have been answered.Carol Bond is willing  to proceed with the planned procedure.  Grace Isaac MD      [address] [place] [address] Office [phone]   Beeper [phone]  12/13/2013 7:07 AM

## 2013-12-14 NOTE — Op Note (Signed)
NAME:  Carol Bond, Carol Bond NO.:  [id number]  MEDICAL RECORD NO.:  [id number]  LOCATION:  [facility]                         FACILITY:  [facility]  PHYSICIAN:  Lanelle Bal, MD    DATE OF BIRTH:  08/29/1956  DATE OF PROCEDURE:  12/13/2013 DATE OF DISCHARGE:  12/13/2013                              OPERATIVE REPORT   PREOPERATIVE DIAGNOSIS:  Large mediastinal and left lung mass suggestive of carcinoma of the lung.  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 Final path pending.  PROCEDURES PERFORMED: 1. Bronchoscopy. 2. Endoscopic ultrasound with transbronchial biopsy and endobronchial     biopsy and placement of left subclavian vein Port-A-Cath with     fluoro and ultrasound guidance.  SURGEON:  Lanelle Bal, MD.  BRIEF HISTORY:  The patient is a 58 year old female who for the past 3-4 months has had increasing respiratory difficulty, treated on and off with steroids and antibiotics.  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  The patient was seen the day prior to surgery and it was recommended that we proceed with obtaining a tissue diagnosis.  Risks and options of bronchoscopy and EBUS and placement of port catheter were discussed with the patient in detail and she agreed and signed informed consent.  DESCRIPTION OF PROCEDURE:  The patient underwent general endotracheal anesthesia without incident.  Appropriate time-out was performed.  A standard flexible bronchoscope was then passed through the endotracheal tube and the tracheobronchial tree was examined.  The right tracheobronchial tree down to the subsegmental level appeared without lesions.  The tracheobronchial toward the left mainstem bronchus appeared normal.  The takeoff of the left upper lobe bronchus  appeared narrowed with the irregularities of the mucosa.  The scope was then removed and the EBUS scope was placed.  Along the left tracheal area, a large lymph nodes were easily identified.  Multiple passes with the transbronchial biopsy needle were obtained and smears and specimen was placed on slides and submitted for immediate examination.  The initial prep had adequate nodal tissue and was suggestive of small cell carcinoma.  Final path is pending.  The EBUS scope was then removed. Additional brushings and biopsies were obtained in the left upper lobe area and submitted for permanent section.  The scope was removed.  The skin of the chest was prepped with Betadine and draped in sterile manner.  Using the SonoSite ultrasound guidance, the left subclavian vein was identified.  The 16-gauge needle was introduced in the left subclavian vein.  A guidewire was placed into the superior vena cava.  A subcutaneous pocket was then created over the left anterior chest.  A 9.6-French preattached Port-A-Cath was selected.  A peel-away sheath was then placed over the guidewire and fluoroscopically, the Port-A-Cath was positioned in the superior vena cava.  There was easy blood flow aspirating through the Port-A-Cath.  Heparinized saline was then used to flush the Port-A-Cath and then 2.5 to 3 mL of concentrated heparin solution were left instilled in the port.  The port  was secured to the anterior chest wall fascia with interrupted silk suture.  The incision was then closed with interrupted 3-0 Vicryl sutures and a 4-0 subcuticular stitch.  Dermabond was placed on the incision sites.  The patient was then awakened and extubated in the operating room, tolerated the procedure without obvious complication.  Sponge and needle count was reported as correct, and she was then transferred to surgical intensive care unit for further postoperative care.     Lanelle Bal, MD     EG/MEDQ  D:   12/14/2013  T:  12/14/2013  Job:  [id number]

## 2013-12-16 ENCOUNTER — Telehealth: Payer: Self-pay | Admitting: Internal Medicine

## 2013-12-16 ENCOUNTER — Encounter: Payer: Self-pay | Admitting: Internal Medicine

## 2013-12-16 ENCOUNTER — Encounter (HOSPITAL_COMMUNITY): Payer: Self-pay | Admitting: Cardiothoracic Surgery

## 2013-12-16 NOTE — Telephone Encounter (Signed)
s.w. pt and advised on new d.t for lab and md visit.....0k and aware.....done

## 2013-12-17 ENCOUNTER — Emergency Department (HOSPITAL_COMMUNITY): Payer: 59

## 2013-12-17 ENCOUNTER — Encounter (HOSPITAL_COMMUNITY): Payer: Self-pay | Admitting: Emergency Medicine

## 2013-12-17 ENCOUNTER — Inpatient Hospital Stay (HOSPITAL_COMMUNITY)
Admission: EM | Admit: 2013-12-17 | Discharge: 2013-12-20 | DRG: 175 | Disposition: A | Discharge status: Home / Self Care | Payer: 59 | Attending: Internal Medicine | Admitting: Internal Medicine

## 2013-12-17 ENCOUNTER — Other Ambulatory Visit: Payer: Self-pay | Admitting: Internal Medicine

## 2013-12-17 ENCOUNTER — Inpatient Hospital Stay (HOSPITAL_COMMUNITY): Payer: 59

## 2013-12-17 ENCOUNTER — Encounter: Payer: Self-pay | Admitting: Internal Medicine

## 2013-12-17 DIAGNOSIS — R0609 Other forms of dyspnea: Secondary | ICD-10-CM

## 2013-12-17 DIAGNOSIS — E119 Type 2 diabetes mellitus without complications: Secondary | ICD-10-CM

## 2013-12-17 DIAGNOSIS — C7949 Secondary malignant neoplasm of other parts of nervous system: Secondary | ICD-10-CM

## 2013-12-17 DIAGNOSIS — Z888 Allergy status to other drugs, medicaments and biological substances status: Secondary | ICD-10-CM

## 2013-12-17 DIAGNOSIS — J449 Chronic obstructive pulmonary disease, unspecified: Secondary | ICD-10-CM | POA: Diagnosis present

## 2013-12-17 DIAGNOSIS — R9431 Abnormal electrocardiogram [ECG] [EKG]: Secondary | ICD-10-CM

## 2013-12-17 DIAGNOSIS — I619 Nontraumatic intracerebral hemorrhage, unspecified: Secondary | ICD-10-CM | POA: Diagnosis present

## 2013-12-17 DIAGNOSIS — F17200 Nicotine dependence, unspecified, uncomplicated: Secondary | ICD-10-CM | POA: Diagnosis present

## 2013-12-17 DIAGNOSIS — Z885 Allergy status to narcotic agent status: Secondary | ICD-10-CM

## 2013-12-17 DIAGNOSIS — J4489 Other specified chronic obstructive pulmonary disease: Secondary | ICD-10-CM | POA: Diagnosis present

## 2013-12-17 DIAGNOSIS — Z7982 Long term (current) use of aspirin: Secondary | ICD-10-CM

## 2013-12-17 DIAGNOSIS — C3400 Malignant neoplasm of unspecified main bronchus: Secondary | ICD-10-CM

## 2013-12-17 DIAGNOSIS — E039 Hypothyroidism, unspecified: Secondary | ICD-10-CM | POA: Diagnosis present

## 2013-12-17 DIAGNOSIS — C3490 Malignant neoplasm of unspecified part of unspecified bronchus or lung: Secondary | ICD-10-CM

## 2013-12-17 DIAGNOSIS — Z800 Family history of malignant neoplasm of digestive organs: Secondary | ICD-10-CM

## 2013-12-17 DIAGNOSIS — I519 Heart disease, unspecified: Secondary | ICD-10-CM

## 2013-12-17 DIAGNOSIS — G936 Cerebral edema: Secondary | ICD-10-CM | POA: Diagnosis present

## 2013-12-17 DIAGNOSIS — IMO0001 Reserved for inherently not codable concepts without codable children: Secondary | ICD-10-CM | POA: Diagnosis present

## 2013-12-17 DIAGNOSIS — Z86711 Personal history of pulmonary embolism: Secondary | ICD-10-CM

## 2013-12-17 DIAGNOSIS — R002 Palpitations: Secondary | ICD-10-CM | POA: Diagnosis present

## 2013-12-17 DIAGNOSIS — C3412 Malignant neoplasm of upper lobe, left bronchus or lung: Secondary | ICD-10-CM | POA: Diagnosis present

## 2013-12-17 DIAGNOSIS — R599 Enlarged lymph nodes, unspecified: Secondary | ICD-10-CM | POA: Diagnosis present

## 2013-12-17 DIAGNOSIS — I2699 Other pulmonary embolism without acute cor pulmonale: Principal | ICD-10-CM

## 2013-12-17 DIAGNOSIS — Z79899 Other long term (current) drug therapy: Secondary | ICD-10-CM

## 2013-12-17 DIAGNOSIS — C7931 Secondary malignant neoplasm of brain: Secondary | ICD-10-CM | POA: Diagnosis present

## 2013-12-17 DIAGNOSIS — Q2579 Other congenital malformations of pulmonary artery: Secondary | ICD-10-CM

## 2013-12-17 DIAGNOSIS — R0989 Other specified symptoms and signs involving the circulatory and respiratory systems: Secondary | ICD-10-CM | POA: Diagnosis present

## 2013-12-17 DIAGNOSIS — E1165 Type 2 diabetes mellitus with hyperglycemia: Secondary | ICD-10-CM

## 2013-12-17 DIAGNOSIS — Z7901 Long term (current) use of anticoagulants: Secondary | ICD-10-CM

## 2013-12-17 DIAGNOSIS — I2510 Atherosclerotic heart disease of native coronary artery without angina pectoris: Secondary | ICD-10-CM | POA: Diagnosis present

## 2013-12-17 DIAGNOSIS — R0789 Other chest pain: Secondary | ICD-10-CM | POA: Diagnosis present

## 2013-12-17 DIAGNOSIS — I10 Essential (primary) hypertension: Secondary | ICD-10-CM | POA: Diagnosis present

## 2013-12-17 DIAGNOSIS — R079 Chest pain, unspecified: Secondary | ICD-10-CM

## 2013-12-17 LAB — BASIC METABOLIC PANEL
BUN: 19 mg/dL (ref 6–23)
CO2: 25 mEq/L (ref 19–32)
Calcium: 9.7 mg/dL (ref 8.4–10.5)
Chloride: 102 mEq/L (ref 96–112)
Creatinine, Ser: 0.53 mg/dL (ref 0.50–1.10)
GLUCOSE: 127 mg/dL — AB (ref 70–99)
POTASSIUM: 4 meq/L (ref 3.7–5.3)
SODIUM: 141 meq/L (ref 137–147)

## 2013-12-17 LAB — CBC WITH DIFFERENTIAL/PLATELET
Basophils Absolute: 0 10*3/uL (ref 0.0–0.1)
Basophils Relative: 1 % (ref 0–1)
EOS ABS: 0.1 10*3/uL (ref 0.0–0.7)
Eosinophils Relative: 3 % (ref 0–5)
HCT: 33.6 % — ABNORMAL LOW (ref 36.0–46.0)
Hemoglobin: 11 g/dL — ABNORMAL LOW (ref 12.0–15.0)
LYMPHS PCT: 35 % (ref 12–46)
Lymphs Abs: 1.4 10*3/uL (ref 0.7–4.0)
MCH: 28.9 pg (ref 26.0–34.0)
MCHC: 32.7 g/dL (ref 30.0–36.0)
MCV: 88.2 fL (ref 78.0–100.0)
Monocytes Absolute: 0.4 10*3/uL (ref 0.1–1.0)
Monocytes Relative: 10 % (ref 3–12)
Neutro Abs: 2.1 10*3/uL (ref 1.7–7.7)
Neutrophils Relative %: 51 % (ref 43–77)
PLATELETS: 267 10*3/uL (ref 150–400)
RBC: 3.81 MIL/uL — AB (ref 3.87–5.11)
RDW: 14 % (ref 11.5–15.5)
WBC: 4 10*3/uL (ref 4.0–10.5)

## 2013-12-17 LAB — TROPONIN I: Troponin I: <0.3 ng/mL (ref <0.30)

## 2013-12-17 LAB — I-STAT TROPONIN, ED: Troponin i, poc: 0 ng/mL (ref 0.00–0.08)

## 2013-12-17 LAB — HEPARIN LEVEL (UNFRACTIONATED)
Heparin Unfractionated: 0.1 IU/mL — ABNORMAL LOW (ref 0.30–0.70)
Heparin Unfractionated: 0.25 IU/mL — ABNORMAL LOW (ref 0.30–0.70)

## 2013-12-17 LAB — GLUCOSE, CAPILLARY
GLUCOSE-CAPILLARY: 130 mg/dL — AB (ref 70–99)
GLUCOSE-CAPILLARY: 174 mg/dL — AB (ref 70–99)

## 2013-12-17 LAB — MRSA PCR SCREENING: MRSA BY PCR: NEGATIVE

## 2013-12-17 LAB — HEMOGLOBIN A1C
Hgb A1c MFr Bld: 8.6 % — ABNORMAL HIGH (ref <5.7)
Mean Plasma Glucose: 200 mg/dL — ABNORMAL HIGH (ref <117)

## 2013-12-17 MED ORDER — MORPHINE SULFATE 2 MG/ML IJ SOLN
2.0000 mg | INTRAMUSCULAR | Status: DC | PRN
  Administered 2013-12-17 – 2013-12-20 (×13): 2 mg via INTRAVENOUS
  Filled 2013-12-17 (×13): qty 1

## 2013-12-17 MED ORDER — ATORVASTATIN CALCIUM 40 MG PO TABS
40.0000 mg | ORAL_TABLET | Freq: Every day | ORAL | Status: DC
  Administered 2013-12-17 – 2013-12-20 (×4): 40 mg via ORAL
  Filled 2013-12-17 (×4): qty 1

## 2013-12-17 MED ORDER — SODIUM CHLORIDE 0.9 % IJ SOLN
3.0000 mL | Freq: Two times a day (BID) | INTRAMUSCULAR | Status: DC
  Administered 2013-12-17: 3 mL via INTRAVENOUS
  Administered 2013-12-18: 10 mL via INTRAVENOUS
  Administered 2013-12-18 – 2013-12-20 (×4): 3 mL via INTRAVENOUS

## 2013-12-17 MED ORDER — GADOBENATE DIMEGLUMINE 529 MG/ML IV SOLN
17.0000 mL | Freq: Once | INTRAVENOUS | Status: AC | PRN
  Administered 2013-12-17: 17 mL via INTRAVENOUS

## 2013-12-17 MED ORDER — OXYCODONE HCL 5 MG PO TABS
5.0000 mg | ORAL_TABLET | ORAL | Status: DC | PRN
  Administered 2013-12-17 – 2013-12-20 (×9): 5 mg via ORAL
  Filled 2013-12-17 (×9): qty 1

## 2013-12-17 MED ORDER — DIPHENHYDRAMINE HCL 25 MG PO CAPS: 25.0000 mg | ORAL_CAPSULE | Freq: Every evening | ORAL | Status: DC | PRN

## 2013-12-17 MED ORDER — ASPIRIN 81 MG PO TABS: 81.0000 mg | ORAL_TABLET | Freq: Once | ORAL | Status: DC

## 2013-12-17 MED ORDER — HEPARIN BOLUS VIA INFUSION
2000.0000 [IU] | Freq: Once | INTRAVENOUS | Status: AC
  Administered 2013-12-17: 2000 [IU] via INTRAVENOUS
  Filled 2013-12-17: qty 2000

## 2013-12-17 MED ORDER — TRIAMCINOLONE ACETONIDE 55 MCG/ACT NA AERO
1.0000 | INHALATION_SPRAY | Freq: Every day | NASAL | Status: DC
  Filled 2013-12-17: qty 10.8

## 2013-12-17 MED ORDER — PANTOPRAZOLE SODIUM 40 MG PO TBEC
40.0000 mg | DELAYED_RELEASE_TABLET | Freq: Every day | ORAL | Status: DC
  Administered 2013-12-17 – 2013-12-20 (×4): 40 mg via ORAL
  Filled 2013-12-17 (×4): qty 1

## 2013-12-17 MED ORDER — ALBUTEROL SULFATE (2.5 MG/3ML) 0.083% IN NEBU
2.5000 mg | INHALATION_SOLUTION | RESPIRATORY_TRACT | Status: DC | PRN
  Administered 2013-12-19 – 2013-12-20 (×2): 2.5 mg via RESPIRATORY_TRACT
  Filled 2013-12-17 (×2): qty 3

## 2013-12-17 MED ORDER — BECLOMETHASONE DIPROPIONATE 80 MCG/ACT IN AERS
2.0000 | INHALATION_SPRAY | Freq: Every day | RESPIRATORY_TRACT | Status: DC
Start: 2013-12-17 — End: 2013-12-17
  Filled 2013-12-17: qty 8.7

## 2013-12-17 MED ORDER — FLUTICASONE PROPIONATE HFA 44 MCG/ACT IN AERO
1.0000 | INHALATION_SPRAY | Freq: Two times a day (BID) | RESPIRATORY_TRACT | Status: DC
  Administered 2013-12-17 – 2013-12-20 (×7): 1 via RESPIRATORY_TRACT
  Filled 2013-12-17: qty 10.6

## 2013-12-17 MED ORDER — HEPARIN (PORCINE) IN NACL 100-0.45 UNIT/ML-% IJ SOLN
1800.0000 [IU]/h | INTRAMUSCULAR | Status: DC
  Administered 2013-12-18 (×2): 1800 [IU]/h via INTRAVENOUS
  Filled 2013-12-17 (×5): qty 250

## 2013-12-17 MED ORDER — INSULIN ASPART 100 UNIT/ML ~~LOC~~ SOLN
0.0000 [IU] | Freq: Three times a day (TID) | SUBCUTANEOUS | Status: DC
Start: 2013-12-17 — End: 2013-12-20
  Administered 2013-12-17: 2 [IU] via SUBCUTANEOUS
  Administered 2013-12-17: 1 [IU] via SUBCUTANEOUS
  Administered 2013-12-18: 2 [IU] via SUBCUTANEOUS
  Administered 2013-12-18: 7 [IU] via SUBCUTANEOUS
  Administered 2013-12-18: 2 [IU] via SUBCUTANEOUS
  Administered 2013-12-19: 7 [IU] via SUBCUTANEOUS
  Administered 2013-12-19 (×2): 5 [IU] via SUBCUTANEOUS
  Administered 2013-12-20: 3 [IU] via SUBCUTANEOUS
  Administered 2013-12-20: 5 [IU] via SUBCUTANEOUS

## 2013-12-17 MED ORDER — MORPHINE SULFATE 4 MG/ML IJ SOLN
4.0000 mg | Freq: Once | INTRAMUSCULAR | Status: AC
Start: 2013-12-17 — End: 2013-12-17
  Administered 2013-12-17: 4 mg via INTRAVENOUS
  Filled 2013-12-17: qty 1

## 2013-12-17 MED ORDER — ONDANSETRON HCL 4 MG PO TABS: 4.0000 mg | ORAL_TABLET | Freq: Four times a day (QID) | ORAL | Status: DC | PRN

## 2013-12-17 MED ORDER — HYDROCOD POLST-CHLORPHEN POLST 10-8 MG/5ML PO LQCR: 5.0000 mL | Freq: Two times a day (BID) | ORAL | Status: DC | PRN

## 2013-12-17 MED ORDER — ONDANSETRON HCL 4 MG/2ML IJ SOLN: 4.0000 mg | Freq: Four times a day (QID) | INTRAMUSCULAR | Status: DC | PRN

## 2013-12-17 MED ORDER — LEVOFLOXACIN 500 MG PO TABS
500.0000 mg | ORAL_TABLET | Freq: Every day | ORAL | Status: DC
  Administered 2013-12-17 – 2013-12-19 (×3): 500 mg via ORAL
  Filled 2013-12-17 (×3): qty 1

## 2013-12-17 MED ORDER — LEVOTHYROXINE SODIUM 150 MCG PO TABS
150.0000 ug | ORAL_TABLET | Freq: Every day | ORAL | Status: DC
  Administered 2013-12-18 – 2013-12-20 (×3): 150 ug via ORAL
  Filled 2013-12-17 (×4): qty 1

## 2013-12-17 MED ORDER — FENOFIBRATE 160 MG PO TABS
160.0000 mg | ORAL_TABLET | Freq: Every day | ORAL | Status: DC
  Administered 2013-12-17 – 2013-12-20 (×4): 160 mg via ORAL
  Filled 2013-12-17 (×4): qty 1

## 2013-12-17 MED ORDER — NITROGLYCERIN 0.4 MG SL SUBL
0.4000 mg | SUBLINGUAL_TABLET | SUBLINGUAL | Status: DC | PRN
  Administered 2013-12-17 (×2): 0.4 mg via SUBLINGUAL
  Filled 2013-12-17: qty 1

## 2013-12-17 MED ORDER — ASPIRIN 81 MG PO CHEW
162.0000 mg | CHEWABLE_TABLET | Freq: Once | ORAL | Status: AC
  Administered 2013-12-17: 162 mg via ORAL
  Filled 2013-12-17: qty 2

## 2013-12-17 MED ORDER — HEPARIN (PORCINE) IN NACL 100-0.45 UNIT/ML-% IJ SOLN
1650.0000 [IU]/h | INTRAMUSCULAR | Status: DC
  Filled 2013-12-17 (×2): qty 250

## 2013-12-17 MED ORDER — HEPARIN BOLUS VIA INFUSION
4000.0000 [IU] | Freq: Once | INTRAVENOUS | Status: AC
  Administered 2013-12-17: 4000 [IU] via INTRAVENOUS
  Filled 2013-12-17: qty 4000

## 2013-12-17 MED ORDER — HEPARIN (PORCINE) IN NACL 100-0.45 UNIT/ML-% IJ SOLN
1350.0000 [IU]/h | INTRAMUSCULAR | Status: DC
  Administered 2013-12-17: 1350 [IU]/h via INTRAVENOUS
  Filled 2013-12-17: qty 250

## 2013-12-17 MED ORDER — ALPRAZOLAM 0.5 MG PO TABS
0.5000 mg | ORAL_TABLET | Freq: Two times a day (BID) | ORAL | Status: DC | PRN
  Administered 2013-12-18 (×2): 0.5 mg via ORAL
  Filled 2013-12-17 (×2): qty 1

## 2013-12-17 MED ORDER — IOHEXOL 350 MG/ML SOLN
100.0000 mL | Freq: Once | INTRAVENOUS | Status: AC | PRN
  Administered 2013-12-17: 100 mL via INTRAVENOUS

## 2013-12-17 MED ORDER — LORATADINE 10 MG PO TABS
10.0000 mg | ORAL_TABLET | Freq: Every day | ORAL | Status: DC
  Administered 2013-12-17 – 2013-12-20 (×4): 10 mg via ORAL
  Filled 2013-12-17 (×4): qty 1

## 2013-12-17 MED ORDER — ESCITALOPRAM OXALATE 10 MG PO TABS
10.0000 mg | ORAL_TABLET | Freq: Every day | ORAL | Status: DC
  Administered 2013-12-17 – 2013-12-20 (×4): 10 mg via ORAL
  Filled 2013-12-17 (×4): qty 1

## 2013-12-17 NOTE — ED Notes (Signed)
Report called to [facility] RN

## 2013-12-17 NOTE — Progress Notes (Signed)
ANTICOAGULATION CONSULT NOTE - Initial Consult  Pharmacy Consult for heparin  Indication: pulmonary embolus  Allergies  Allergen Reactions  . Actos [Pioglitazone] Swelling    Low blood sugar  . Vicodin [Hydrocodone-Acetaminophen] Itching    Patient Measurements: Height: 5\' 6"  (167.6 cm) Weight: 180 lb (81.647 kg) IBW/kg (Calculated) : 59.3 Heparin Dosing Weight: 76kg  Vital Signs: Temp: 97.6 F (36.4 C) (04/21 0611) Temp src: Oral (04/21 0611) BP: 109/63 mmHg (04/21 0700) Pulse Rate: 78 (04/21 0700)  Labs:  Recent Labs  12/17/13 0620  HGB 11.0*  HCT 33.6*  PLT 267  CREATININE 0.53    Estimated Creatinine Clearance: 85.5 ml/min (by C-G formula based on Cr of 0.53).   Medical History: Past Medical History  Diagnosis Date  . Pulmonary embolism   . Hypothyroid   . Diabetes   . Cancer     Lung     Assessment: 58 YOF presents with chest pain and SOB.  Recent diagnosis of lung cancer s/p biopsy 4/18.  She has history of  previous PE in 1980.  CTA 4/21 reveals compelte thrombosis of L pulmonary artery branches distal to the L mainstem bronchus  CBC: Hgb = 11, pltc: WNL  Renal fx WNL  Goal of Therapy:  Heparin level 0.3-0.7 units/ml Monitor platelets by anticoagulation protocol: Yes   Plan:   Heparin bolus 4000 units then heparin 1350 units/hr  Check 6h heparin level  Daily heparin level and CBC  Await plans for long-term anticoagulation (?LMWH with lung cancer)  Doreene Eland, PharmD, BCPS.   Pager: [phone]  12/17/2013,8:31 AM

## 2013-12-17 NOTE — Progress Notes (Signed)
Subjective: The patient is seen and examined today. Her husband Carol Bond and her daughter Carol Bond were at the bedside. She was recently diagnosed with extensive stage small cell lung cancer with a large mass centered in the left hilum and invading the adjacent mediastinum including AP window. The mass is compressing and causing almost complete occlusion of the distal left main pulmonary artery. There was also a masslike area in the pericardium on the left. She has significant shortness of breath earlier today with pain on the left side of the chest with radiation to the neck and left arm. She had CT angiogram of the chest performed earlier today and it showed interval progression with complete thrombosis of the left pulmonary artery branches distal to the left mainstem bronchus. There was pericardial invasion with a stable mediastinal adenopathy. The patient was started on anticoagulation. She is feeling a little bit better but continues to complain of shortness breath with minimal exertion as well as left-sided chest pain. She has no fever or chills.  Objective: Vital signs in last 24 hours: Temp:  [97.6 F (36.4 C)-98.2 F (36.8 C)] 98.2 F (36.8 C) (04/21 1600) Pulse Rate:  [73-93] 81 (04/21 1900) Resp:  [12-22] 20 (04/21 1900) BP: (102-140)/(49-105) 132/72 mmHg (04/21 1800) SpO2:  [91 %-100 %] 93 % (04/21 1900) Weight:  [180 lb (81.647 kg)-184 lb 8.4 oz (83.7 kg)] 184 lb 8.4 oz (83.7 kg) (04/21 1000)  Intake/Output from previous day:   Intake/Output this shift:    General appearance: alert, cooperative, fatigued and mild distress Resp: diminished breath sounds LUL and dullness to percussion LUL Cardio: regular rate and rhythm, S1, S2 normal, no murmur, click, rub or gallop GI: soft, non-tender; bowel sounds normal; no masses,  no organomegaly Extremities: extremities normal, atraumatic, no cyanosis or edema  Lab Results:   Recent Labs  12/17/13 0620  WBC 4.0  HGB 11.0*  HCT 33.6*   PLT 267   BMET  Recent Labs  12/17/13 0620  NA 141  K 4.0  CL 102  CO2 25  GLUCOSE 127*  BUN 19  CREATININE 0.53  CALCIUM 9.7    Studies/Results: Ct Angio Chest Pe W/cm &/or Wo Cm  12/17/2013   CLINICAL DATA:  Chest pain and shortness of breath.  Lung cancer.  EXAM: CT ANGIOGRAPHY CHEST WITH CONTRAST  TECHNIQUE: Multidetector CT imaging of the chest was performed using the standard protocol during bolus administration of intravenous contrast. Multiplanar CT image reconstructions and MIPs were obtained to evaluate the vascular anatomy.  CONTRAST:  163mL OMNIPAQUE IOHEXOL 350 MG/ML SOLN  COMPARISON:  CT chest 12/09/2013.  FINDINGS: Re-demonstrated is a large mass centered at the left hilum and invading the adjacent mediastinum including AP window. Pericardial invasion is present. 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 Small left lower lobe branches noted previously no longer fill, likely due to thrombosis. No developing left lung infarction. Slight increase left pleural effusion could be malignant or reactive. Pericardial invasion. Slight decrease pericardial effusion. No acute PE on the right. Unchanged spiculated nodule left upper lobe. No right lung infiltrates. Stable mediastinal adenopathy.  Stable left adrenal mass. Hepatic steatosis. Normal aorta. No destructive osseous lesions.  Review of the MIP images confirms the above findings.  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 No developing left lung infarction. Slight increase left pleural effusion. Pericardial invasion with Stable mediastinal  adenopathy.   Electronically Signed   By: Rolla Flatten M.D.   On: 12/17/2013 08:14    Medications: I have reviewed the patient's current medications.  CODE STATUS: Full code  Assessment/Plan: This is a very pleasant 58 years old white female with  extensive stage small cell lung cancer diagnosed a few days ago. I have a lengthy discussion with the patient and her family today about her current disease status and treatment options. I recommended for the patient to start systemic chemotherapy with carboplatin for AUC of 5 on day 1 and etoposide 120 mg/M2 on days 1, 2 and 3 with Neulasta support on day 4. The patient was also given him the option of palliative care and hospice referral but she is interested in systemic chemotherapy. I discussed with her the adverse effect of the chemotherapy including but not limited to alopecia, myelosuppression, nausea and vomiting, peripheral neuropathy, liver or renal dysfunction. She is expected to start the first cycle of her chemotherapy tomorrow. She would have a chemotherapy education session before starting the first dose of her treatment. I also recommended for the patient to receive a short course of palliative radiotherapy to the left lung mass for relief of the left pulmonary artery obstruction. For the recently diagnosed pulmonary embolism, the patient will continue on anticoagulation and it is preferred for the patient to be on Lovenox long term after discharge. The patient and her family are anxious to start treatment as soon as possible. She had a PET scan scheduled but this can be performed on an outpatient basis after discharge. I will order MRI of the brain to be performed during her hospitalization. Thank you for taking good care of Carol Bond. I will continue to follow up the patient with you and assist in her management.  LOS: 0 days    [facility] 12/17/2013

## 2013-12-17 NOTE — Progress Notes (Signed)
Faxed fmla form to [phone]; put original in registration desk.

## 2013-12-17 NOTE — H&P (Signed)
[facility] History and Physical  CLAUDE SWENDSEN IRC:[id number] DOB: September 19, 1955 DOA: 12/17/2013  Referring physician:  PCP: Namon Cirri  Specialists:   Chief Complaint: SOB, chest pain   HPI: Carol Bond is a 58 y.o. female with DM, HT, COPD, h/o PE/DVT, recently Dx with Lung CA presented with L sided pleuritic chest pain, associated with SOB, DOE and found to have PE; denies exertional chest pain, no dizziness, no syncope; has chronic cough, no fever; no nausea, vomiting or diarrhea   Review of Systems: The patient denies anorexia, fever, weight loss,, vision loss, decreased hearing, hoarseness, chest pain, syncope, dyspnea on exertion, peripheral edema, balance deficits, hemoptysis, abdominal pain, melena, hematochezia, severe indigestion/heartburn, hematuria, incontinence, genital sores, muscle weakness, suspicious skin lesions, transient blindness, difficulty walking, depression, unusual weight change, abnormal bleeding, enlarged lymph nodes, angioedema, and breast masses.    Past Medical History  Diagnosis Date  . Pulmonary embolism   . Hypothyroid   . Diabetes   . Cancer     Lung   Past Surgical History  Procedure Laterality Date  . Abdominal hysterectomy    . Spine surgery    . Cervical fusion    . Video bronchoscopy with endobronchial ultrasound N/A 12/13/2013    Procedure: VIDEO BRONCHOSCOPY WITH ENDOBRONCHIAL ULTRASOUND;  Surgeon: Grace Isaac, MD;  Location: [facility];  Service: Thoracic;  Laterality: N/A;  . Portacath placement Left 12/13/2013    Procedure: INSERTION PORT-A-CATH;  Surgeon: Grace Isaac, MD;  Location: [facility];  Service: Thoracic;  Laterality: Left;   Social History:  reports that she has quit smoking. Her smoking use included Cigarettes. She has a 12.5 pack-year smoking history. She has never used smokeless tobacco. Her alcohol and drug histories are not on file. Home;  where does patient live--home, ALF, SNF? and with whom if at  home? Yes;  Can patient participate in ADLs?  Allergies  Allergen Reactions  . Actos [Pioglitazone] Swelling    Low blood sugar  . Vicodin [Hydrocodone-Acetaminophen] Itching    Family History  Problem Relation Age of Onset  . Colon cancer Mother   . Liver cancer Father     (be sure to complete)  Prior to Admission medications   Medication Sig Start Date End Date Taking? Authorizing Provider  aspirin 81 MG tablet Take 162 mg by mouth once.   Yes Historical Provider, MD  atorvastatin (LIPITOR) 40 MG tablet Take 40 mg by mouth daily.   Yes Historical Provider, MD  beclomethasone (QVAR) 80 MCG/ACT inhaler Inhale 2 puffs into the lungs at bedtime.   Yes Historical Provider, MD  betamethasone dipropionate (DIPROLENE) 0.05 % cream Apply 1 application topically daily as needed.   Yes Historical Provider, MD  Canagliflozin (INVOKANA) 300 MG TABS Take 1 tablet by mouth daily before breakfast.   Yes Historical Provider, MD  chlorpheniramine-HYDROcodone (TUSSIONEX) 10-8 MG/5ML LQCR Take 5 mLs by mouth every 12 (twelve) hours as needed for cough.   Yes Historical Provider, MD  escitalopram (LEXAPRO) 10 MG tablet Take 10 mg by mouth daily.   Yes Historical Provider, MD  esomeprazole (NEXIUM) 40 MG capsule Take 40 mg by mouth daily at 12 noon.   Yes Historical Provider, MD  fenofibrate 160 MG tablet Take 160 mg by mouth daily.   Yes Historical Provider, MD  fexofenadine (ALLEGRA) 30 MG tablet Take 30 mg by mouth 2 (two) times daily.   Yes Historical Provider, MD  glimepiride (AMARYL) 4 MG tablet Take 4 mg by mouth  daily with breakfast.   Yes Historical Provider, MD  levofloxacin (LEVAQUIN) 500 MG tablet Take 500 mg by mouth daily.   Yes Historical Provider, MD  levothyroxine (SYNTHROID, LEVOTHROID) 150 MCG tablet Take 150 mcg by mouth daily before breakfast.   Yes Historical Provider, MD  LOSARTAN POTASSIUM PO Take 50 mg by mouth daily.   Yes Historical Provider, MD  oxyCODONE (OXY IR/ROXICODONE) 5  MG immediate release tablet Take 1 tablet (5 mg total) by mouth every 4 (four) hours as needed for severe pain. 12/13/13  Yes Grace Isaac, MD  sitaGLIPtin-metformin (JANUMET) 50-1000 MG per tablet Take 1 tablet by mouth 2 (two) times daily with a meal.   Yes Historical Provider, MD  triamcinolone (NASACORT AQ) 55 MCG/ACT AERO nasal inhaler Place 1 spray into the nose daily.   Yes Historical Provider, MD   Physical Exam: Filed Vitals:   12/17/13 0700  BP: 109/63  Pulse: 78  Temp:   Resp: 12     General:  alert  Eyes: eom-i  ENT: no oral ulcers   Neck: supple, no JVD  Cardiovascular: s1,s2 rrr; port cath access no drainage   Respiratory: few crackles in LL  Abdomen: soft, nt,nd   Skin: no rash  Musculoskeletal: no :LE edema  Psychiatric: no hallucinations   Neurologic: CN 2-12 intact, motor 5/5 BL  Labs on Admission:  Basic Metabolic Panel:  Recent Labs Lab 12/13/13 0641 12/17/13 0620  NA 140 141  K 4.5 4.0  CL 102 102  CO2 23 25  GLUCOSE 195* 127*  BUN 27* 19  CREATININE 0.65 0.53  CALCIUM 10.2 9.7   Liver Function Tests:  Recent Labs Lab 12/13/13 0641  AST 25  ALT 42*  ALKPHOS 56  BILITOT 0.2*  PROT 7.4  ALBUMIN 4.1   No results found for this basename: LIPASE, AMYLASE,  in the last 168 hours No results found for this basename: AMMONIA,  in the last 168 hours CBC:  Recent Labs Lab 12/13/13 0641 12/17/13 0620  WBC 5.9 4.0  NEUTROABS  --  2.1  HGB 12.6 11.0*  HCT 39.7 33.6*  MCV 90.6 88.2  PLT 311 267   Cardiac Enzymes: No results found for this basename: CKTOTAL, CKMB, CKMBINDEX, TROPONINI,  in the last 168 hours  BNP (last 3 results) No results found for this basename: PROBNP,  in the last 8760 hours CBG:  Recent Labs Lab 12/13/13 0553 12/13/13 1008 12/13/13 1325  GLUCAP 210* 162* 139*    Radiological Exams on Admission: Ct Angio Chest Pe W/cm &/or Wo Cm  12/17/2013   CLINICAL DATA:  Chest pain and shortness of  breath.  Lung cancer.  EXAM: CT ANGIOGRAPHY CHEST WITH CONTRAST  TECHNIQUE: Multidetector CT imaging of the chest was performed using the standard protocol during bolus administration of intravenous contrast. Multiplanar CT image reconstructions and MIPs were obtained to evaluate the vascular anatomy.  CONTRAST:  118mL OMNIPAQUE IOHEXOL 350 MG/ML SOLN  COMPARISON:  CT chest 12/09/2013.  FINDINGS: Re-demonstrated is a large mass centered at the left hilum and invading the adjacent mediastinum including AP window. Pericardial invasion is present. 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 Small left lower lobe branches noted previously no longer fill, likely due to thrombosis. No developing left lung infarction. Slight increase left pleural effusion could be malignant or reactive. Pericardial invasion. Slight decrease pericardial effusion. No acute PE on the right. Unchanged spiculated nodule left upper  lobe. No right lung infiltrates. Stable mediastinal adenopathy.  Stable left adrenal mass. Hepatic steatosis. Normal aorta. No destructive osseous lesions.  Review of the MIP images confirms the above findings.  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 No developing left lung infarction. Slight increase left pleural effusion. Pericardial invasion with Stable mediastinal adenopathy.   Electronically Signed   By: Rolla Flatten M.D.   On: 12/17/2013 08:14    EKG: Independently reviewed. St elevation in inf leads  Assessment/Plan Principal Problem:   Pulmonary embolus Active Problems:   Lung cancer   Hypothyroid   Diabetes   DOE (dyspnea on exertion)   58 y.o. female with DM, HT, COPD, h/o PE/DVT, recently Dx with Lung CA presented with L sided pleuritic chest pain, associated with SOB, DOE and found to have PE  1. Acute PE; CTA: complete thrombosis of the left  pulmonary artery branches distal to the left mainstem bronchus; ? Compressed with lung CA -started IV heparin, obtain echo; cont oxygen, prn inhalers; monitor SDU -h/o PE/DVT, active CA likely needs life long AC  2. Chest pain likely due to PE; but, ECG: ST elevation changes in inf leads; ED d/w Dr. Terrence Dupont; I have also d/w Dr. Terrence Dupont and Dr. Ellyn Hack; it is probable related to acute PE;  -will monitor ECG, trop; consult cardiology   3. Metastatic lung cancer small cell; pend x ray possible chemo; needs PetCT -notified oncology;   4. COPD, cont inhalers, oxygen; no wheezing on exam   5. DM no recent HA1C; cont ISS inpatient; check a1c -hold metformin with IV contrast   6. HTN stable; hold ARB due to IV contrast   Oncology, cardiology; if consultant consulted, please document name and whether formally or informally consulted  Code Status: full (must indicate code status--if unknown or must be presumed, indicate so) Family Communication:  D/w patient, his husband, daughter (indicate person spoken with, if applicable, with phone number if by telephone) Disposition Plan: home pend clinical improvement  (indicate anticipated LOS)  Time spent: >35 minutes   Kinnie Feil [facility] Pager [phone]  If 7PM-7AM, please contact night-coverage [email or web address] Password [email or web address] 12/17/2013, 9:02 AM

## 2013-12-17 NOTE — Consult Note (Signed)
CARDIOLOGY CONSULTATION NOTE.  NAME:  Carol Bond   MRN: [id number] DOB:  September 04, 1955   ADMIT DATE: 12/17/2013  Reason for Consult: Abnormal ECG  Requesting Physician: Dr. Daleen Bo  Primary Cardiologist: None  HPI: This is a 58 y.o. female with a past medical history significant for DM, HTN, COPD, PE/DVT with relatively recent Dx of Lung CA.  She presented to [facility] ER with c/w L side pleuritic CP associated with SOB/DOE.  CP not associated with exertion - more with inspiration & cough.  Sx began ~midnight with L upper chest discomfort & SOB, but things started changing yesterday PM with coughing spell.  She recalls similar symptoms of acute onset sharp chest pain associated with significant dyspnea during her initial PE at age 59.  The remainder of Cardiovascular ROS: positive for - chest pain, dyspnea on exertion, palpitations, shortness of breath and SOB worse over last few months (associated with Lung CA Dx); intermittent episodes of dizziness both with orthostatic changes and simply with walking negative for - edema, loss of consciousness, murmur, orthopnea, paroxysmal nocturnal dyspnea, rapid heart rate or syncope/near syncope, TIA/amaurosis fugax, melena, hematochezia, hematuria, epistaxis.:  PMHx:  CARDIAC HISTORY: NST:  05/2006  - Normal ST, no ischemia or infarction.  Past Medical History  Diagnosis Date  . Pulmonary embolism   . Hypothyroid   . Diabetes   . Cancer     Lung   Past Surgical History  Procedure Laterality Date  . Abdominal hysterectomy    . Spine surgery    . Cervical fusion    . Video bronchoscopy with endobronchial ultrasound N/A 12/13/2013    Procedure: VIDEO BRONCHOSCOPY WITH ENDOBRONCHIAL ULTRASOUND;  Surgeon: Grace Isaac, MD;  Location: [facility];  Service: Thoracic;  Laterality: N/A;  . Portacath placement Left 12/13/2013    Procedure: INSERTION PORT-A-CATH;  Surgeon: Grace Isaac, MD;  Location: [facility] OR;  Service: Thoracic;  Laterality: Left;     FAMHx: Family History  Problem Relation Age of Onset  . Colon cancer Mother   . Liver cancer Father     SOCHx:  reports that she has quit smoking. Her smoking use included Cigarettes. She has a 12.5 pack-year smoking history. She has never used smokeless tobacco. Her alcohol and drug histories are not on file.  ALLERGIES: Allergies  Allergen Reactions  . Actos [Pioglitazone] Swelling    Low blood sugar  . Vicodin [Hydrocodone-Acetaminophen] Itching   ROS: A comprehensive review of systems was negative except for: As expected, mild psychological changes associated with diagnosis of cancer, but usually stable and feeling fine. No change in bowel or bladder habits. No recent illnesses. She has noted coughing but no hemoptysis or productive cough.  HOME MEDICATIONS: Prescriptions prior to admission  Medication Sig Dispense Refill  . aspirin 81 MG tablet Take 162 mg by mouth once.      Marland Kitchen atorvastatin (LIPITOR) 40 MG tablet Take 40 mg by mouth daily.      . beclomethasone (QVAR) 80 MCG/ACT inhaler Inhale 2 puffs into the lungs at bedtime.      . betamethasone dipropionate (DIPROLENE) 0.05 % cream Apply 1 application topically daily as needed.      . Canagliflozin (INVOKANA) 300 MG TABS Take 1 tablet by mouth daily before breakfast.      . chlorpheniramine-HYDROcodone (TUSSIONEX) 10-8 MG/5ML LQCR Take 5 mLs by mouth every 12 (twelve) hours as needed for cough.      . escitalopram (LEXAPRO) 10 MG tablet Take 10 mg  by mouth daily.      Marland Kitchen esomeprazole (NEXIUM) 40 MG capsule Take 40 mg by mouth daily at 12 noon.      . fenofibrate 160 MG tablet Take 160 mg by mouth daily.      . fexofenadine (ALLEGRA) 30 MG tablet Take 30 mg by mouth 2 (two) times daily.      Marland Kitchen glimepiride (AMARYL) 4 MG tablet Take 4 mg by mouth daily with breakfast.      . levofloxacin (LEVAQUIN) 500 MG tablet Take 500 mg by mouth daily.      Marland Kitchen levothyroxine (SYNTHROID, LEVOTHROID) 150 MCG tablet Take 150 mcg by mouth  daily before breakfast.      . LOSARTAN POTASSIUM PO Take 50 mg by mouth daily.      Marland Kitchen oxyCODONE (OXY IR/ROXICODONE) 5 MG immediate release tablet Take 1 tablet (5 mg total) by mouth every 4 (four) hours as needed for severe pain.  30 tablet  0  . sitaGLIPtin-metformin (JANUMET) 50-1000 MG per tablet Take 1 tablet by mouth 2 (two) times daily with a meal.      . triamcinolone (NASACORT AQ) 55 MCG/ACT AERO nasal inhaler Place 1 spray into the nose daily.        HOSPITAL MEDICATIONS: Scheduled Meds: . atorvastatin  40 mg Oral Daily  . escitalopram  10 mg Oral Daily  . fenofibrate  160 mg Oral Daily  . fluticasone  1 puff Inhalation BID  . insulin aspart  0-9 Units Subcutaneous TID WC  . levofloxacin  500 mg Oral Daily  . [START ON 12/18/2013] levothyroxine  150 mcg Oral QAC breakfast  . loratadine  10 mg Oral Daily  . pantoprazole  40 mg Oral Daily  . sodium chloride  3 mL Intravenous Q12H   Continuous Infusions: . heparin 1,350 Units/hr (12/17/13 0857)   PRN Meds:.albuterol, chlorpheniramine-HYDROcodone, nitroGLYCERIN, ondansetron (ZOFRAN) IV, ondansetron, oxyCODONE  VITALS: Blood pressure 118/68, pulse 76, temperature 97.8 F (36.6 C), temperature source Oral, resp. rate 16, height 5\' 6"  (1.676 m), weight 184 lb 8.4 oz (83.7 kg), SpO2 100.00%.  PHYSICAL EXAM: General appearance: alert, appears stated age, mild distress / non-toxic, mildly obese and pleasant (up-beat) mood & affect. HEENT: [place]/AT, EOMI, MMM, anicteric sclera Neck: no adenopathy, no carotid bruit, no JVD, supple, symmetrical, trachea midline and thyroid not enlarged, symmetric, no tenderness/mass/nodules Lungs: Nonlabored mild left lower lung field crackles but no rales. Mild occasional expiratory wheezing but not significant; no accessory muscle use. Heart: regular rate and rhythm, S1, S2 normal, no murmur, click, rub or gallop and normal apical impulse; left-sided Port-A-Cath in place healing well Abdomen: soft,  non-tender; bowel sounds normal; no masses,  no organomegaly Extremities: extremities normal, atraumatic, no cyanosis or edema Pulses: 2+ and symmetric Neurologic: Grossly normal  LABS: Results for orders placed during the hospital encounter of 12/17/13 (from the past 24 hour(s))  CBC WITH DIFFERENTIAL     Status: Abnormal   Collection Time    12/17/13  6:20 AM      Result Value Ref Range   WBC 4.0  4.0 - 10.5 K/uL   RBC 3.81 (*) 3.87 - 5.11 MIL/uL   Hemoglobin 11.0 (*) 12.0 - 15.0 g/dL   HCT 33.6 (*) 36.0 - 46.0 %   MCV 88.2  78.0 - 100.0 fL   MCH 28.9  26.0 - 34.0 pg   MCHC 32.7  30.0 - 36.0 g/dL   RDW 14.0  11.5 - 15.5 %   Platelets 267  150 - 400 K/uL   Neutrophils Relative % 51  43 - 77 %   Neutro Abs 2.1  1.7 - 7.7 K/uL   Lymphocytes Relative 35  12 - 46 %   Lymphs Abs 1.4  0.7 - 4.0 K/uL   Monocytes Relative 10  3 - 12 %   Monocytes Absolute 0.4  0.1 - 1.0 K/uL   Eosinophils Relative 3  0 - 5 %   Eosinophils Absolute 0.1  0.0 - 0.7 K/uL   Basophils Relative 1  0 - 1 %   Basophils Absolute 0.0  0.0 - 0.1 K/uL  BASIC METABOLIC PANEL     Status: Abnormal   Collection Time    12/17/13  6:20 AM      Result Value Ref Range   Sodium 141  137 - 147 mEq/L   Potassium 4.0  3.7 - 5.3 mEq/L   Chloride 102  96 - 112 mEq/L   CO2 25  19 - 32 mEq/L   Glucose, Bld 127 (*) 70 - 99 mg/dL   BUN 19  6 - 23 mg/dL   Creatinine, Ser 0.53  0.50 - 1.10 mg/dL   Calcium 9.7  8.4 - 10.5 mg/dL   GFR calc non Af Amer >90  >90 mL/min   GFR calc Af Amer >90  >90 mL/min  I-STAT TROPOININ, ED     Status: None   Collection Time    12/17/13  6:32 AM      Result Value Ref Range   Troponin i, poc 0.00  0.00 - 0.08 ng/mL   Comment 3           MRSA PCR SCREENING     Status: None   Collection Time    12/17/13 10:04 AM      Result Value Ref Range   MRSA by PCR NEGATIVE  NEGATIVE  TROPONIN I     Status: None   Collection Time    12/17/13 10:30 AM      Result Value Ref Range   Troponin I <0.30   <0.30 ng/mL    IMAGING:  CTA Chest 4/21: Interval progression with now complete thrombosis of the left pulmonary artery branches distal to the left mainstem bronchus. No developing left lung infarction. Slight increase left pleural effusion. Pericardial invasion with Stable mediastinal adenopathy.    Adult ECG Report: 0622 4/21 Rate: 81 ;  Rhythm: normal sinus rhythm with borderline 1st Deg AVB  QRS Axis: 74;  PR Interval: 204;  QRS Duration: 82;  QTc: 461 ;  Voltages: Normal  Conduction Disturbances: first-degree A-V block   Other Abnormalities: Q in V1 - stable finding of "possible Septal MI, Age Undetermined"; Borderline < 0,5 mm STE in III, aVF   Narrative Interpretation: Relatively stable ECG when compared to 4/17  IMPRESSION: Principal Problem:   Pulmonary embolus Active Problems:   Abnormal finding on EKG - subtle inferior ST elevations   Lung cancer   Hypothyroid   Diabetes   DOE (dyspnea on exertion)   PE (pulmonary embolism)   Mild Inferior STE without reciprocal changes. -- I do not think this represents a STEMI, especially would defer surgical has been -6 hours post onset of pain.  CP that as described is more pleuritic that anginal  Known Large L sided PE - ? If ECG changes are related to RV strain.  RECOMMENDATION:  Anticoagulate with heparin as ordered  Follow cardiac biomarkers - initial Troponin Negative (a slight elevation would be consistent  with PE mediated RV strain)  Check 2 D Echo to assess RV size & function as well as potential regional Wall Motion abnormality.  We will followup with results of echocardiogram.   Leonie Man, M.D., M.S. Interventional Cardiologist  [place] Pager # [phone] 12/17/2013

## 2013-12-17 NOTE — Progress Notes (Addendum)
PHARMACY - HEPARIN  (brief note)   Heparin level = 0.1 with heparin infusing @ 1350 units/hr  Verified with RN that heparin infusing appropriately with interruptions of therapy.  No bleeding complications noted  Heparin level subtherapeutic (goal 0.3 - 0.7)  PLAN:  Rebolus with heparin 2000 units IV x 1 now  Increase heparin infusion rate to 1650 units/hr  Check heparin level 6 hrs after heparin rate increased.  Leone Haven, PharmD 12/17/13 @ 16:51  ADDENDUM:  Notified by RN ~ 20:30 that patient would be going for MRI and IV heparin would be turned off for procedure.  Decided to check heparin level prior to infusion being turned off to make sure heparin level was increasing.  HL @ 20:40 = 0.25 (approx 3.5 hr after heparin bolus and rate infusion to 1650 units/hr)  As heparin level still subtherapeutic, will increase heparin rate to 1800 units/hr once heparin is resumed.   Will recheck heparin level 6 hrs after heparin rate resumed at increased rate.  Leone Haven, PharmD 12/17/13 @ 21:43

## 2013-12-17 NOTE — ED Provider Notes (Signed)
CSN: [id number]     Arrival date & time 12/17/13  0600 History   First MD Initiated Contact with Patient 12/17/13 0602     No chief complaint on file.    (Consider location/radiation/quality/duration/timing/severity/associated sxs/prior Treatment) HPI Comments: Patient presents to the ED with a chief complaint of chest pain.  She states that the symptoms started this morning at around 1:30 or 2:00.  She reports associated SOB, diaphoresis, and pain that radiates to the jaw and left arm.  She was recently diagnosed with lung cancer.  She reports past history of PE, and states that this feels similar.  She states that the pain is 3-4/10.  There are no aggravating or alleviating factors.  She has taken a baby aspirin and an oxycodone with no relief.  The history is provided by the patient. No language interpreter was used.    Past Medical History  Diagnosis Date  . Pulmonary embolism   . Hypothyroid   . Diabetes    Past Surgical History  Procedure Laterality Date  . Abdominal hysterectomy    . Spine surgery    . Cervical fusion    . Video bronchoscopy with endobronchial ultrasound N/A 12/13/2013    Procedure: VIDEO BRONCHOSCOPY WITH ENDOBRONCHIAL ULTRASOUND;  Surgeon: Grace Isaac, MD;  Location: [facility];  Service: Thoracic;  Laterality: N/A;  . Portacath placement Left 12/13/2013    Procedure: INSERTION PORT-A-CATH;  Surgeon: Grace Isaac, MD;  Location: [facility] OR;  Service: Thoracic;  Laterality: Left;   Family History  Problem Relation Age of Onset  . Colon cancer Mother   . Liver cancer Father    History  Substance Use Topics  . Smoking status: Current Every Day Smoker -- 0.50 packs/day for 25 years    Types: Cigarettes  . Smokeless tobacco: Never Used  . Alcohol Use: Not on file   OB History   Grav Para Term Preterm Abortions TAB SAB Ect Mult Living                 Review of Systems  Constitutional: Negative for fever and chills.  Respiratory: Positive for  shortness of breath.   Cardiovascular: Positive for chest pain.  Gastrointestinal: Negative for nausea, vomiting, diarrhea and constipation.  Genitourinary: Negative for dysuria.      Allergies  Actos and Vicodin  Home Medications   Prior to Admission medications   Medication Sig Start Date End Date Taking? Authorizing Provider  atorvastatin (LIPITOR) 40 MG tablet Take 40 mg by mouth daily.    Historical Provider, MD  betamethasone dipropionate (DIPROLENE) 0.05 % cream Apply 1 application topically daily as needed.    Historical Provider, MD  Canagliflozin (INVOKANA) 300 MG TABS Take 1 tablet by mouth daily before breakfast.    Historical Provider, MD  chlorpheniramine-HYDROcodone (TUSSIONEX) 10-8 MG/5ML LQCR Take 5 mLs by mouth every 12 (twelve) hours as needed for cough.    Historical Provider, MD  escitalopram (LEXAPRO) 10 MG tablet Take 10 mg by mouth daily.    Historical Provider, MD  esomeprazole (NEXIUM) 40 MG capsule Take 40 mg by mouth daily at 12 noon.    Historical Provider, MD  fenofibrate 160 MG tablet Take 160 mg by mouth daily.    Historical Provider, MD  fexofenadine (ALLEGRA) 30 MG tablet Take 30 mg by mouth 2 (two) times daily.    Historical Provider, MD  fluticasone (FLONASE) 50 MCG/ACT nasal spray Place 1 spray into both nostrils 2 (two) times daily.  Historical Provider, MD  glimepiride (AMARYL) 4 MG tablet Take 4 mg by mouth daily with breakfast.    Historical Provider, MD  levofloxacin (LEVAQUIN) 500 MG tablet Take 500 mg by mouth daily.    Historical Provider, MD  levothyroxine (SYNTHROID, LEVOTHROID) 150 MCG tablet Take 150 mcg by mouth daily before breakfast.    Historical Provider, MD  LOSARTAN POTASSIUM PO Take 50 mg by mouth daily.    Historical Provider, MD  oxyCODONE (OXY IR/ROXICODONE) 5 MG immediate release tablet Take 1 tablet (5 mg total) by mouth every 4 (four) hours as needed for severe pain. 12/13/13   Grace Isaac, MD  oxyCODONE-acetaminophen  (PERCOCET/ROXICET) 5-325 MG per tablet Take by mouth every 4 (four) hours as needed for severe pain.    Historical Provider, MD  sitaGLIPtin-metformin (JANUMET) 50-1000 MG per tablet Take 1 tablet by mouth 2 (two) times daily with a meal.    Historical Provider, MD   There were no vitals taken for this visit. Physical Exam  Nursing note and vitals reviewed. Constitutional: She is oriented to person, place, and time. She appears well-developed and well-nourished.  HENT:  Head: Normocephalic and atraumatic.  Eyes: Conjunctivae and EOM are normal. Pupils are equal, round, and reactive to light.  Neck: Normal range of motion. Neck supple.  Cardiovascular: Normal rate, regular rhythm and normal heart sounds.  Exam reveals no gallop and no friction rub.   No murmur heard. Pulmonary/Chest: Effort normal and breath sounds normal. No respiratory distress. She has no wheezes. She has no rales. She exhibits no tenderness.  Well healing site of port placement to the upper left chest wall, no evidence of infection, discharge, or cellulitis  Abdominal: Soft. She exhibits no distension and no mass. There is no tenderness. There is no rebound and no guarding.  Musculoskeletal: Normal range of motion. She exhibits no edema and no tenderness.  Neurological: She is alert and oriented to person, place, and time.  Skin: Skin is warm and dry.  Psychiatric: She has a normal mood and affect. Her behavior is normal. Judgment and thought content normal.    ED Course  Procedures (including critical care time) Labs Review Results for orders placed during the hospital encounter of 12/17/13  CBC WITH DIFFERENTIAL      Result Value Ref Range   WBC 4.0  4.0 - 10.5 K/uL   RBC 3.81 (*) 3.87 - 5.11 MIL/uL   Hemoglobin 11.0 (*) 12.0 - 15.0 g/dL   HCT 33.6 (*) 36.0 - 46.0 %   MCV 88.2  78.0 - 100.0 fL   MCH 28.9  26.0 - 34.0 pg   MCHC 32.7  30.0 - 36.0 g/dL   RDW 14.0  11.5 - 15.5 %   Platelets 267  150 - 400 K/uL    Neutrophils Relative % 51  43 - 77 %   Neutro Abs 2.1  1.7 - 7.7 K/uL   Lymphocytes Relative 35  12 - 46 %   Lymphs Abs 1.4  0.7 - 4.0 K/uL   Monocytes Relative 10  3 - 12 %   Monocytes Absolute 0.4  0.1 - 1.0 K/uL   Eosinophils Relative 3  0 - 5 %   Eosinophils Absolute 0.1  0.0 - 0.7 K/uL   Basophils Relative 1  0 - 1 %   Basophils Absolute 0.0  0.0 - 0.1 K/uL  BASIC METABOLIC PANEL      Result Value Ref Range   Sodium 141  137 -  147 mEq/L   Potassium 4.0  3.7 - 5.3 mEq/L   Chloride 102  96 - 112 mEq/L   CO2 25  19 - 32 mEq/L   Glucose, Bld 127 (*) 70 - 99 mg/dL   BUN 19  6 - 23 mg/dL   Creatinine, Ser 0.53  0.50 - 1.10 mg/dL   Calcium 9.7  8.4 - 10.5 mg/dL   GFR calc non Af Amer >90  >90 mL/min   GFR calc Af Amer >90  >90 mL/min  I-STAT TROPOININ, ED      Result Value Ref Range   Troponin i, poc 0.00  0.00 - 0.08 ng/mL   Comment 3            Dg Chest 2 View  12/13/2013   CLINICAL DATA:  Status post bronchoscopy  EXAM: CHEST  2 VIEW  COMPARISON:  12/13/2013  FINDINGS: Cardiac shadow is within normal limits. A new left-sided chest wall port is seen with the catheter tip in the mid to distal superior vena cava. No pneumothorax is noted. Persistent left hilar/ mediastinal mass lesion is seen. There is decreased inspiratory effort with atelectatic changes in the left lung likely related to obstructive change from the known mass lesion. The right lung is clear.  IMPRESSION: No pneumothorax following port placement.  Poor aeration of the left lung likely related to central compression and poor inspiratory effort.   Electronically Signed   By: Inez Catalina M.D.   On: 12/13/2013 11:28   Chest 2 View  12/13/2013   CLINICAL DATA:  Preoperative  EXAM: CHEST  2 VIEW  COMPARISON:  Prior CT from 12/09/2013  FINDINGS: Abnormal convexity along the left mediastinal border overlying the region of the U aortic knob is compatible with known left hilar/mediastinal mass within this region. Mild  cardiomegaly is stable.  Lungs are otherwise clear without focal infiltrate, pulmonary edema, or pleural effusion. No pneumothorax.  No acute osseous abnormality.  IMPRESSION: 1. Stable appearance of left hilar/ mediastinal mass as compared to recent CT from 12/09/2013. 2. No other acute cardiopulmonary abnormality identified.   Electronically Signed   By: Jeannine Boga M.D.   On: 12/13/2013 06:19   Ct Angio Chest Pe W/cm &/or Wo Cm  12/17/2013   CLINICAL DATA:  Chest pain and shortness of breath.  Lung cancer.  EXAM: CT ANGIOGRAPHY CHEST WITH CONTRAST  TECHNIQUE: Multidetector CT imaging of the chest was performed using the standard protocol during bolus administration of intravenous contrast. Multiplanar CT image reconstructions and MIPs were obtained to evaluate the vascular anatomy.  CONTRAST:  154mL OMNIPAQUE IOHEXOL 350 MG/ML SOLN  COMPARISON:  CT chest 12/09/2013.  FINDINGS: Re-demonstrated is a large mass centered at the left hilum and invading the adjacent mediastinum including AP window. Pericardial invasion is present. 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 Small left lower lobe branches noted previously no longer fill, likely due to thrombosis. No developing left lung infarction. Slight increase left pleural effusion could be malignant or reactive. Pericardial invasion. Slight decrease pericardial effusion. No acute PE on the right. Unchanged spiculated nodule left upper lobe. No right lung infiltrates. Stable mediastinal adenopathy.  Stable left adrenal mass. Hepatic steatosis. Normal aorta. No destructive osseous lesions.  Review of the MIP images confirms the above findings.  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 No developing left lung infarction. Slight  increase left pleural effusion. Pericardial invasion with Stable mediastinal  adenopathy.   Electronically Signed   By: Rolla Flatten M.D.   On: 12/17/2013 08:14   Ct Angio Chest Pe W/cm &/or Wo Cm  12/09/2013   CLINICAL DATA:  Chest pain and severe cough. Pulmonary embolism in 1980. Ex-smoker.  BUN and creatinine were obtained on site at [facility] at  [address]  Results:  BUN 14 mg/dL,  Creatinine 0.6 mg/dL.  EXAM: CT ANGIOGRAPHY CHEST WITH CONTRAST  TECHNIQUE: Multidetector CT imaging of the chest was performed using the standard protocol during bolus administration of intravenous contrast. Multiplanar CT image reconstructions and MIPs were obtained to evaluate the vascular anatomy.  CONTRAST:  125mL OMNIPAQUE IOHEXOL 350 MG/ML SOLN  COMPARISON:  Chest radiographs dated 02/03/2009 and chest CT dated 05/20/2007.  FINDINGS: Large mass centered at the left hilum and invading the adjacent mediastinum, including the AP window. This mass measures 8.9 x 5.6 cm on image number 44 of series 4 and 6.8 cm in length on image number 50 of series 600. This is compressing and causing almost complete occlusion of the distal left main pulmonary artery with lack of opacification of the majority of the left pulmonary artery branches.  There is also a new spiculated nodule in the left upper lobe measuring 1.2 cm in maximum diameter on image number 27 of series 5 which is also a new. There is also a new 4 mm subpleural nodule in the left upper lobe on image number 27 of series 5.  Also noted are multiple enlarged mediastinal lymph nodes. The majority of these are in the AP window, including a node with a short axis diameter of 13.5 mm on image number 45. A proximal right hilar node has a short axis diameter of 10 mm on image number 44.  There is an interval oval, mass-like area of in the pericardium on the left, measuring 2.8 x 1.0 cm on image number 69. There is also a small pericardial effusion with a maximum thickness of 7 mm.  There are multiple areas of patchy density in the left  upper lobe, lingula and left lower lobe. There is also an interval 3 mm subpleural nodule in the left lower lobe on image number 89, at the inferior aspect of somewhat nodular patchy opacity.  A small left pleural effusion is noted. On the last image, there is a suggestion of a partially imaged left adrenal mass, measuring 1.3 x 0.9 cm. The more inferior portion of the left adrenal gland is not included and the majority of the right adrenal gland is not included.  The remainder of the lungs are mildly hyperexpanded with mildly prominent interstitial markings. Diffuse peribronchial thickening is also noted.  The pulmonary arteries on the right are normally opacified with no pulmonary emboli seen.  Mild diffuse low density of the liver is noted in the upper abdomen. Thoracic spine degenerative changes are noted.  Review of the MIP images confirms the above findings.  IMPRESSION: 1. 8.9 x 6.8 x 5.6 cm left hilar and mediastinal mass causing almost complete occlusion of the distal left main pulmonary artery with lack of opacification of the majority of the pulmonary arteries on the left. 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 2. Mediastinal and right hilar metastatic adenopathy. 3. Possible left adrenal metastasis. 4. 1.2 cm spiculated left upper lobe nodule, concerning for a primary lung carcinoma. 5. 4 mm left upper  lobe subpleural nodules suspicious for a metastasis. 6. Possible left pericardial metastasis measuring 2.8 x 1.0 cm. 7. Patchy opacities most likely representing areas of pulmonary infarction in the left upper lobe, lingula and left lower lobe. An area in the left lower lobe has nodular components. 8. Small left pleural effusion. 9. Small pericardial effusion. 10. COPD. 11. Mild diffuse hepatic steatosis.  These results were called by telephone at the time of interpretation on 12/09/2013 at 12:56 PM to Dr. Antony Contras , who verbally acknowledged these  results.   Electronically Signed   By: Enrique Sack M.D.   On: 12/09/2013 12:30   Dg Fluoro Guide Cv Line-no Report  12/13/2013   CLINICAL DATA: inserting port   FLOURO GUIDE CV LINE  Fluoroscopy was utilized by the requesting physician.  No radiographic  interpretation.    Mm Screening Breast Tomo Bilateral  12/05/2013   CLINICAL DATA:  Screening.  EXAM: DIGITAL SCREENING BILATERAL MAMMOGRAM WITH 3D TOMO WITH CAD  COMPARISON:  Previous exam(s).  ACR Breast Density Category b: There are scattered areas of fibroglandular density.  FINDINGS: There are no findings suspicious for malignancy. Images were processed with CAD.  IMPRESSION: No mammographic evidence of malignancy. A result letter of this screening mammogram will be mailed directly to the patient.  RECOMMENDATION: Screening mammogram in one year. (Code:SM-B-01Y)  BI-RADS CATEGORY  1: Negative.   Electronically Signed   By: Lovey Newcomer M.D.   On: 12/05/2013 11:00     Imaging Review No results found. ED ECG REPORT  I personally interpreted this EKG   Date: 12/17/2013   Rate: 81  Rhythm: normal sinus rhythm  QRS Axis: normal  Intervals: normal  ST/T Wave abnormalities: ST elevations inferiorly  Conduction Disutrbances:none  Narrative Interpretation:   Old EKG Reviewed: none available     EKG Interpretation   Date/Time:  Tuesday December 17 2013 06:22:31 EDT Ventricular Rate:  83 PR Interval:  204 QRS Duration: 82 QT Interval:  392 QTC Calculation: 461 R Axis:   74 Text Interpretation:  Sinus rhythm Borderline prolonged PR interval  Probable left atrial enlargement Anteroseptal infarct, age indeterminate  ST elevation, inferior leads, unchanged Confirmed by Kathrynn Humble, MD, ANKIT  [phone]) on 12/17/2013 6:50:21 AM      MDM   Final diagnoses:  Pulmonary embolus  Chest pain    Patient with CP and SOB.  Also recently diagnosed with lung cancer.  PMH remarkable for PE in 1980.  Cardiac risk factors are HTN, DM, HL, and smoking  history.  Aspirin PTA.  Symptoms have been waxing and waning since 1:30-2:00 this morning.  Will check labs, imaging, and control pain.  Anticipate admission.  6:30 Patient seen by and discussed with Dr. Kathrynn Humble, who consulted Dr. Terrence Dupont.  Will check labs.    6:46 AM Pain free following aspirin, nitro, and morphine.  Plan: If labs, EKG, and CT angio chest are negative-> consult cardiology, admit to medicine  8:35 AM CT is remarkable for interval development of left sided PE.  Reviewed by myself and Dr. Kathrynn Humble.  Will start heparin and admit to medicine.  Discussed the patient with Dr. Daleen Bo, who will admit.  Montine Circle, PA-C 12/17/13 [phone]

## 2013-12-17 NOTE — Progress Notes (Signed)
Put fmla form in registration desk °

## 2013-12-17 NOTE — Progress Notes (Signed)
  Echocardiogram 2D Echocardiogram has been performed.  Basilia Jumbo 12/17/2013, 3:19 PM

## 2013-12-17 NOTE — ED Notes (Signed)
Pt states that she was recently diagnosed with lung cancer; pt states that she woke up this am around 1:30 with chest pain and shortness of breath that radiates to left arm; pt also states that she can feel it in her jaw as well; pt states that she is feeling dizzy and c/o diaphoresis; pt denies nausea; pt states that she took pain medication (Oxycodone 5mg ) without relief; pt reports taking ASA 162mg  PTA.

## 2013-12-18 ENCOUNTER — Ambulatory Visit
Admit: 2013-12-18 | Discharge: 2013-12-18 | Disposition: A | Discharge status: Home / Self Care | Payer: 59 | Attending: Radiation Oncology | Admitting: Radiation Oncology

## 2013-12-18 ENCOUNTER — Other Ambulatory Visit: Payer: 59

## 2013-12-18 ENCOUNTER — Encounter: Payer: Self-pay | Admitting: Radiation Oncology

## 2013-12-18 ENCOUNTER — Encounter: Payer: Self-pay | Admitting: *Deleted

## 2013-12-18 ENCOUNTER — Encounter (HOSPITAL_COMMUNITY): Payer: 59

## 2013-12-18 ENCOUNTER — Ambulatory Visit: Payer: 59 | Admitting: Internal Medicine

## 2013-12-18 DIAGNOSIS — C3490 Malignant neoplasm of unspecified part of unspecified bronchus or lung: Secondary | ICD-10-CM

## 2013-12-18 DIAGNOSIS — Z79899 Other long term (current) drug therapy: Secondary | ICD-10-CM | POA: Insufficient documentation

## 2013-12-18 DIAGNOSIS — C7931 Secondary malignant neoplasm of brain: Secondary | ICD-10-CM | POA: Diagnosis present

## 2013-12-18 DIAGNOSIS — Z794 Long term (current) use of insulin: Secondary | ICD-10-CM | POA: Insufficient documentation

## 2013-12-18 DIAGNOSIS — C7949 Secondary malignant neoplasm of other parts of nervous system: Secondary | ICD-10-CM

## 2013-12-18 DIAGNOSIS — Z510 Encounter for antineoplastic radiation therapy: Secondary | ICD-10-CM | POA: Insufficient documentation

## 2013-12-18 DIAGNOSIS — R0602 Shortness of breath: Secondary | ICD-10-CM

## 2013-12-18 DIAGNOSIS — R9431 Abnormal electrocardiogram [ECG] [EKG]: Secondary | ICD-10-CM

## 2013-12-18 DIAGNOSIS — B370 Candidal stomatitis: Secondary | ICD-10-CM | POA: Insufficient documentation

## 2013-12-18 LAB — BASIC METABOLIC PANEL
BUN: 18 mg/dL (ref 6–23)
CHLORIDE: 101 meq/L (ref 96–112)
CO2: 31 mEq/L (ref 19–32)
Calcium: 9.3 mg/dL (ref 8.4–10.5)
Creatinine, Ser: 0.81 mg/dL (ref 0.50–1.10)
GFR calc non Af Amer: 80 mL/min — ABNORMAL LOW (ref >90)
Glucose, Bld: 231 mg/dL — ABNORMAL HIGH (ref 70–99)
Potassium: 4.9 mEq/L (ref 3.7–5.3)
Sodium: 139 mEq/L (ref 137–147)

## 2013-12-18 LAB — CBC
HCT: 33.3 % — ABNORMAL LOW (ref 36.0–46.0)
Hemoglobin: 10.6 g/dL — ABNORMAL LOW (ref 12.0–15.0)
MCH: 28.9 pg (ref 26.0–34.0)
MCHC: 31.8 g/dL (ref 30.0–36.0)
MCV: 90.7 fL (ref 78.0–100.0)
PLATELETS: 251 10*3/uL (ref 150–400)
RBC: 3.67 MIL/uL — AB (ref 3.87–5.11)
RDW: 14.1 % (ref 11.5–15.5)
WBC: 4.5 10*3/uL (ref 4.0–10.5)

## 2013-12-18 LAB — GLUCOSE, CAPILLARY
GLUCOSE-CAPILLARY: 158 mg/dL — AB (ref 70–99)
GLUCOSE-CAPILLARY: 177 mg/dL — AB (ref 70–99)
GLUCOSE-CAPILLARY: 325 mg/dL — AB (ref 70–99)
Glucose-Capillary: 194 mg/dL — ABNORMAL HIGH (ref 70–99)
Glucose-Capillary: 286 mg/dL — ABNORMAL HIGH (ref 70–99)

## 2013-12-18 LAB — HEPARIN LEVEL (UNFRACTIONATED)
HEPARIN UNFRACTIONATED: 0.29 [IU]/mL — AB (ref 0.30–0.70)
HEPARIN UNFRACTIONATED: 0.42 [IU]/mL (ref 0.30–0.70)
Heparin Unfractionated: 0.37 IU/mL (ref 0.30–0.70)

## 2013-12-18 MED ORDER — DEXAMETHASONE 4 MG PO TABS
4.0000 mg | ORAL_TABLET | Freq: Three times a day (TID) | ORAL | Status: DC
  Administered 2013-12-18 – 2013-12-20 (×8): 4 mg via ORAL
  Filled 2013-12-18 (×10): qty 1

## 2013-12-18 NOTE — Progress Notes (Signed)
ANTICOAGULATION CONSULT NOTE - Brief Note  Pharmacy Consult for Heparin  Indication: pulmonary embolus  Heparin level follow- up  Heparin level = 0.42 on 1800 units/hr  Plan  Heparin levels therapeutic x 2 at 1800 units/hr,  Continue current dose and check daily heparin level and CBC  Doreene Eland, PharmD, BCPS.   Pager: [phone] 12/18/2013 8:56 PM

## 2013-12-18 NOTE — Progress Notes (Signed)
[place] ,RN, BSN,CCM: Chart reviewed for patient updates and needs. 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

## 2013-12-18 NOTE — Progress Notes (Signed)
Subjective: Carol Bond is seen and examined today. The patient is feeling fine with no specific complaints. She is a little bit upset after finding out about the several brain metastases detected on MRI of the brain performed last night. She continues to have shortness of breath with exertion. The patient denied having any significant nausea or vomiting. She has no fever or chills.  Objective: Vital signs in last 24 hours: Temp:  [97.7 F (36.5 C)-98.3 F (36.8 C)] 98.3 F (36.8 C) (04/22 0346) Pulse Rate:  [76-93] 79 (04/22 0735) Resp:  [12-21] 15 (04/22 0735) BP: (108-139)/(49-80) 128/74 mmHg (04/22 0735) SpO2:  [91 %-100 %] 98 % (04/22 0735) Weight:  [184 lb 8.4 oz (83.7 kg)] 184 lb 8.4 oz (83.7 kg) (04/21 1000)  Intake/Output from previous day: 04/21 0701 - 04/22 0700 In: 1112.3 [P.O.:720; I.V.:392.3] Out: 2400 [Urine:2400] Intake/Output this shift:    General appearance: alert, cooperative, fatigued and mild distress Resp: diminished breath sounds LUL and dullness to percussion LUL Cardio: regular rate and rhythm, S1, S2 normal, no murmur, click, rub or gallop GI: soft, non-tender; bowel sounds normal; no masses,  no organomegaly Extremities: extremities normal, atraumatic, no cyanosis or edema  Lab Results:   Recent Labs  12/17/13 0620 12/18/13 0315  WBC 4.0 4.5  HGB 11.0* 10.6*  HCT 33.6* 33.3*  PLT 267 251   BMET  Recent Labs  12/17/13 0620 12/18/13 0315  NA 141 139  K 4.0 4.9  CL 102 101  CO2 25 31  GLUCOSE 127* 231*  BUN 19 18  CREATININE 0.53 0.81  CALCIUM 9.7 9.3    Studies/Results: Ct Angio Chest Pe W/cm &/or Wo Cm  12/17/2013   CLINICAL DATA:  Chest pain and shortness of breath.  Lung cancer.  EXAM: CT ANGIOGRAPHY CHEST WITH CONTRAST  TECHNIQUE: Multidetector CT imaging of the chest was performed using the standard protocol during bolus administration of intravenous contrast. Multiplanar CT image reconstructions and MIPs were obtained to  evaluate the vascular anatomy.  CONTRAST:  185mL OMNIPAQUE IOHEXOL 350 MG/ML SOLN  COMPARISON:  CT chest 12/09/2013.  FINDINGS: Re-demonstrated is a large mass centered at the left hilum and invading the adjacent mediastinum including AP window. Pericardial invasion is present. 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 Small left lower lobe branches noted previously no longer fill, likely due to thrombosis. No developing left lung infarction. Slight increase left pleural effusion could be malignant or reactive. Pericardial invasion. Slight decrease pericardial effusion. No acute PE on the right. Unchanged spiculated nodule left upper lobe. No right lung infiltrates. Stable mediastinal adenopathy.  Stable left adrenal mass. Hepatic steatosis. Normal aorta. No destructive osseous lesions.  Review of the MIP images confirms the above findings.  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 No developing left lung infarction. Slight increase left pleural effusion. Pericardial invasion with Stable mediastinal adenopathy.   Electronically Signed   By: Rolla Flatten M.D.   On: 12/17/2013 08:14   Mr Jeri Cos YW Contrast  12/18/2013   CLINICAL DATA:  Small cell lung cancer, evaluate for brain metastasis.  EXAM: MRI HEAD WITHOUT AND WITH CONTRAST  TECHNIQUE: Multiplanar, multiecho pulse sequences of the brain and surrounding structures were obtained without and with intravenous contrast.  CONTRAST:  55mL MULTIHANCE GADOBENATE DIMEGLUMINE 529 MG/ML IV SOLN  COMPARISON:  CT HEAD WO/W CM dated 05/19/2005  FINDINGS: At least 7 infratentorial mildly enhancing lesions with slight reduced diffusion consistent with hypercellularity consistent with metastasis, largest in the left cerebellum measuring 8 x 9 mm.  At least 19 supratentorial enhancing hypermetabolic metastasis seen, with surrounding  bright FLAIR T2 hyperintense vasogenic edema, including a 8 x 9 mm lesion in right temporal lobe, tiny lesions in the right posterior frontal lobe, motor cortex. 3-4 mm enhancing lesions in the caudate bilaterally. No midline shift or mass effect. Tiny foci of susceptibility artifact associated with a few of the metastasis in the supratentorial brain. A few of the metastasis shows central hypo enhancement consistent with cystic necrosis. No reduced diffusion to suggest acute ischemia. The ventricles and sulci are overall normal for patient's age.  Additional patchy FLAIR T2 hyperintense signal in the pons, with scattered subcentimeter supratentorial T2 hyperintensity suggesting sequelae of chronic small vessel ischemic disease. Right occipital developmental venous anomaly, benign finding.  No abnormal extra-axial fluid collections. Though many of the intracranial metastasis are within the cortex/gray-white matter junction, no convincing evidence of dural or leptomeningeal metastatic disease. Normal major intracranial vascular flow voids seen at the skull base.  Ocular globes and orbital contents are unremarkable. No paranasal sinus air-fluid levels. Mastoid air cells appear well-aerated. Of note, there is mild deformity of the left dorsal spinal cord due to apparent C1-2 arthropathy. No abnormal sellar expansion. No cerebellar tonsillar ectopia.  IMPRESSION: At least 7 infratentorial and 19 supratentorial subcentimeter enhancing intraparenchymal lesions consistent with metastatic disease, some of which are slightly hemorrhagic and/or cystic. Associated mild vasogenic edema without significant mass effect.  Mild-to-moderate additional white matter changes suggest sequelae of chronic small vessel ischemic disease, less likely nonenhancing metastatic disease.   Electronically Signed   By: Elon Alas   On: 12/18/2013 00:23    Medications: I have reviewed the patient's current  medications.  Assessment/Plan: 1) extensive stage small cell lung cancer: The patient was supposed to start the first cycle of her systemic chemotherapy with carboplatin and etoposide today. This treatment will be delayed for the next 2 weeks because of the findings of multiple brain metastasis that would require treatment with whole brain irradiation. In the meantime, I would ask Dr. Sondra Come to start palliative radiotherapy to the large left hilar and mediastinal mass. 2) multiple metastatic brain metastasis: I consulted Dr. Sondra Come to evaluate the patient and start whole brain irradiation. I will start the patient on Decadron 4 mg by mouth 3 times a day and this will be tapered gradually by Dr. Sondra Come during her radiation. 3) newly diagnosed pulmonary embolism: This is a very tricky situation in the presence of some hemorrhagic brain lesions. The lesions in the brain are small and I would still consider anticoagulation for this patient as she will start palliative radiotherapy to the brain lesions soon. Thank you for taking good care of Ms. Bunn, I will continue to follow up the patient with you and assist in her management an as-needed basis during her hospital stay.  LOS: 1 day    [facility] 12/18/2013

## 2013-12-18 NOTE — ED Provider Notes (Signed)
Shared service with midlevel provider. I have personally seen and examined the patient, providing direct face to face care, presenting with the chief complaint of chest pain, with some typical and atypical features. Cardiopulm exam are normal. Hx of cancer, CT PE ordered, and is + for PE. Physical exam findings include no resp distress. Plan will be admit. I have reviewed the nursing documentation on past medical history, family history, and social history.   Varney Biles, MD 12/18/13 [phone]

## 2013-12-18 NOTE — Progress Notes (Signed)
PROGRESS NOTE  Carol Bond NWG:[id number] DOB: 10/02/1955 DOA: 12/17/2013 PCP: Namon Cirri  HPI 58 y.o. female with DM, HT, COPD, h/o PE/DVT, recently Dx with Lung CA presented with L sided pleuritic chest pain, associated with SOB, DOE and found to have PE   Assessment/Plan:  Acute PE; CTA: complete thrombosis of the left pulmonary artery branches distal to the left mainstem bronchus; ? Compressed with lung CA  - continue heparin.   Chest pain likely due to PE -will monitor ECG, trop; cardiology consulted.   Lung cancer small cell with metasiasis to the brain - Oncology has been consulted, appreciate input. MRI of the brain shows multiple metastasis with possible hemorrhages. Agree with oncology that she probably would benefit from continued anticoagulation given #1. Closely monitor. Radiation oncology following as well. - decadron  COPD, cont inhalers, oxygen; no wheezing on exam  - with cough, Levofloxacin for 3 days  DM type 2- uncontrolled, hemoglobin A1c is 8.6. - Sliding scale  HTN stable; hold ARB due to IV contrast    Diet: carb  Fluids: None DVT Prophylaxis: Heparin infusion  Code Status: Full code Family Communication: d/w patient  Disposition Plan: Inpatient  Consultants:  Oncology  Cardiology  Radiation oncology  Procedures:  None   Antibiotics Levofloxacin 4/21>>  HPI/Subjective: Patient feeling a little better this morning, denies shortness or breath  Objective: Filed Vitals:   12/17/13 2034 12/18/13 0000 12/18/13 0012 12/18/13 0346  BP: 137/76  139/80 120/73  Pulse: 81  80 79  Temp: 98 F (36.7 C) 97.7 F (36.5 C)  98.3 F (36.8 C)  TempSrc: Oral Axillary  Oral  Resp:   19 20  Height:      Weight:      SpO2: 94%  99% 99%    Intake/Output Summary (Last 24 hours) at 12/18/13 0708 Last data filed at 12/18/13 0653  Gross per 24 hour  Intake 1094.28 ml  Output   2050 ml  Net -955.72 ml   Filed Weights   12/17/13  0611 12/17/13 1000  Weight: 81.647 kg (180 lb) 83.7 kg (184 lb 8.4 oz)    Exam:   General:  NAD  Cardiovascular: regular rate and rhythm, without MRG  Respiratory: good air movement, clear to auscultation throughout, no wheezing, ronchi or rales  Abdomen: soft, not tender to palpation, positive bowel sounds  MSK: no peripheral edema  Neuro: CN 2-12 grossly intact, MS 5/5 in all 4  Data Reviewed: Basic Metabolic Panel:  Recent Labs Lab 12/13/13 0641 12/17/13 0620 12/18/13 0315  NA 140 141 139  K 4.5 4.0 4.9  CL 102 102 101  CO2 23 25 31   GLUCOSE 195* 127* 231*  BUN 27* 19 18  CREATININE 0.65 0.53 0.81  CALCIUM 10.2 9.7 9.3   Liver Function Tests:  Recent Labs Lab 12/13/13 0641  AST 25  ALT 42*  ALKPHOS 56  BILITOT 0.2*  PROT 7.4  ALBUMIN 4.1   CBC:  Recent Labs Lab 12/13/13 0641 12/17/13 0620 12/18/13 0315  WBC 5.9 4.0 4.5  NEUTROABS  --  2.1  --   HGB 12.6 11.0* 10.6*  HCT 39.7 33.6* 33.3*  MCV 90.6 88.2 90.7  PLT 311 267 251   Cardiac Enzymes:  Recent Labs Lab 12/17/13 1030 12/17/13 1545 12/17/13 2040  TROPONINI <0.30 <0.30 <0.30   CBG:  Recent Labs Lab 12/13/13 1008 12/13/13 1325 12/17/13 1139 12/17/13 1657 12/17/13 2253  GLUCAP 162* 139* 158* 130* 174*    Recent  Results (from the past 240 hour(s))  SURGICAL PCR SCREEN     Status: None   Collection Time    12/13/13  7:10 AM      Result Value Ref Range Status   MRSA, PCR NEGATIVE  NEGATIVE Final   Staphylococcus aureus NEGATIVE  NEGATIVE Final   Comment:            The Xpert SA Assay (FDA     approved for NASAL specimens     in patients over 73 years of age),     is one component of     a comprehensive surveillance     program.  Test performance has     been validated by [employer] for patients greater     than or equal to 43 year old.     It is not intended     to diagnose infection nor to     guide or monitor treatment.  MRSA PCR SCREENING     Status: None     Collection Time    12/17/13 10:04 AM      Result Value Ref Range Status   MRSA by PCR NEGATIVE  NEGATIVE Final   Comment:            The GeneXpert MRSA Assay (FDA     approved for NASAL specimens     only), is one component of a     comprehensive MRSA colonization     surveillance program. It is not     intended to diagnose MRSA     infection nor to guide or     monitor treatment for     MRSA infections.     Studies: Ct Angio Chest Pe W/cm &/or Wo Cm  12/17/2013   CLINICAL DATA:  Chest pain and shortness of breath.  Lung cancer.  EXAM: CT ANGIOGRAPHY CHEST WITH CONTRAST  TECHNIQUE: Multidetector CT imaging of the chest was performed using the standard protocol during bolus administration of intravenous contrast. Multiplanar CT image reconstructions and MIPs were obtained to evaluate the vascular anatomy.  CONTRAST:  147mL OMNIPAQUE IOHEXOL 350 MG/ML SOLN  COMPARISON:  CT chest 12/09/2013.  FINDINGS: Re-demonstrated is a large mass centered at the left hilum and invading the adjacent mediastinum including AP window. Pericardial invasion is present. 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 Small left lower lobe branches noted previously no longer fill, likely due to thrombosis. No developing left lung infarction. Slight increase left pleural effusion could be malignant or reactive. Pericardial invasion. Slight decrease pericardial effusion. No acute PE on the right. Unchanged spiculated nodule left upper lobe. No right lung infiltrates. Stable mediastinal adenopathy.  Stable left adrenal mass. Hepatic steatosis. Normal aorta. No destructive osseous lesions.  Review of the MIP images confirms the above findings.  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 No developing left lung infarction. Slight increase left pleural effusion. Pericardial invasion with  Stable mediastinal adenopathy.   Electronically Signed   By: Rolla Flatten M.D.   On: 12/17/2013 08:14   Mr Jeri Cos YF Contrast  12/18/2013   CLINICAL DATA:  Small cell lung cancer, evaluate for brain metastasis.  EXAM: MRI HEAD WITHOUT AND WITH CONTRAST  TECHNIQUE: Multiplanar, multiecho pulse sequences of the brain and surrounding structures were obtained without and with intravenous contrast.  CONTRAST:  21mL MULTIHANCE  GADOBENATE DIMEGLUMINE 529 MG/ML IV SOLN  COMPARISON:  CT HEAD WO/W CM dated 05/19/2005  FINDINGS: At least 7 infratentorial mildly enhancing lesions with slight reduced diffusion consistent with hypercellularity consistent with metastasis, largest in the left cerebellum measuring 8 x 9 mm.  At least 19 supratentorial enhancing hypermetabolic metastasis seen, with surrounding bright FLAIR T2 hyperintense vasogenic edema, including a 8 x 9 mm lesion in right temporal lobe, tiny lesions in the right posterior frontal lobe, motor cortex. 3-4 mm enhancing lesions in the caudate bilaterally. No midline shift or mass effect. Tiny foci of susceptibility artifact associated with a few of the metastasis in the supratentorial brain. A few of the metastasis shows central hypo enhancement consistent with cystic necrosis. No reduced diffusion to suggest acute ischemia. The ventricles and sulci are overall normal for patient's age.  Additional patchy FLAIR T2 hyperintense signal in the pons, with scattered subcentimeter supratentorial T2 hyperintensity suggesting sequelae of chronic small vessel ischemic disease. Right occipital developmental venous anomaly, benign finding.  No abnormal extra-axial fluid collections. Though many of the intracranial metastasis are within the cortex/gray-white matter junction, no convincing evidence of dural or leptomeningeal metastatic disease. Normal major intracranial vascular flow voids seen at the skull base.  Ocular globes and orbital contents are unremarkable. No  paranasal sinus air-fluid levels. Mastoid air cells appear well-aerated. Of note, there is mild deformity of the left dorsal spinal cord due to apparent C1-2 arthropathy. No abnormal sellar expansion. No cerebellar tonsillar ectopia.  IMPRESSION: At least 7 infratentorial and 19 supratentorial subcentimeter enhancing intraparenchymal lesions consistent with metastatic disease, some of which are slightly hemorrhagic and/or cystic. Associated mild vasogenic edema without significant mass effect.  Mild-to-moderate additional white matter changes suggest sequelae of chronic small vessel ischemic disease, less likely nonenhancing metastatic disease.   Electronically Signed   By: Elon Alas   On: 12/18/2013 00:23    Scheduled Meds: . atorvastatin  40 mg Oral Daily  . escitalopram  10 mg Oral Daily  . fenofibrate  160 mg Oral Daily  . fluticasone  1 puff Inhalation BID  . insulin aspart  0-9 Units Subcutaneous TID WC  . levofloxacin  500 mg Oral Daily  . levothyroxine  150 mcg Oral QAC breakfast  . loratadine  10 mg Oral Daily  . pantoprazole  40 mg Oral Daily  . sodium chloride  3 mL Intravenous Q12H   Continuous Infusions: . heparin 1,800 Units/hr (12/18/13 0011)    Principal Problem:   Pulmonary embolus Active Problems:   Lung cancer   Hypothyroid   Diabetes   DOE (dyspnea on exertion)   PE (pulmonary embolism)   Abnormal finding on EKG - subtle inferior ST elevations  Time spent: 35  This note has been created with Surveyor, quantity. Any transcriptional errors are unintentional.   Marzetta Board, MD [facility] Pager [phone]. If 7 PM - 7 AM, please contact night-coverage at [email or web address], password [facility] 12/18/2013, 7:08 AM  LOS: 1 day

## 2013-12-18 NOTE — Clinical Documentation Improvement (Signed)
Possible Clinical Conditions?   ____Cerebral Edema ____Other Condition ____Cannot Clinically Determine    Diagnostics: 4/21: MR Brain W Wo contrast: Associated mild vasogenic edema.  Thank You, Theron Arista, Clinical Documentation Specialist:  [phone]  [facility]

## 2013-12-18 NOTE — Progress Notes (Signed)
  Radiation Oncology         [phone] ________________________________  Name: Carol Bond MRN: [id number]  Date: 12/18/2013  DOB: Jan 30, 1956  Simulation Verification Note  Status: inpatient  NARRATIVE: The patient was brought to the treatment unit and placed in the planned treatment position. The clinical setup was verified. Then port films were obtained and uploaded to the radiation oncology medical record software.  The treatment beams were carefully compared against the planned radiation fields. The position location and shape of the radiation fields was reviewed. They targeted volume of tissue appears to be appropriately covered by the radiation beams. Organs at risk appear to be excluded as planned.  Based on my personal review, I approved the simulation verification. The patient's treatment will proceed as planned.  -----------------------------------  Blair Promise, PhD, MD

## 2013-12-18 NOTE — Progress Notes (Signed)
Went to [facility] ICU to see pt.  I spoke with her and her family and listened to their concerns.  I addressed questions and concerns. They were thankful for the time I spent with them.

## 2013-12-18 NOTE — Progress Notes (Signed)
Radiation Oncology         [phone] ________________________________  Name: Carol Bond MRN: [id number]  Date: 12/18/2013  DOB: 07-30-1956  Reevaluation Note - inpatient  CC: [name],[name], PA-C  Gara Kroner, MD  Diagnosis:   Lung cancer   Primary site: Lung (Left)   Staging method: AJCC 7th Edition   Clinical free text: Extensive stage small cell lung cancer   Clinical: Stage IV (T3, N2, M1b)    Summary: Stage IV (T3, N2, M1b)   Narrative:  The patient is seen today for further evaluation at the courtesy of Dr. Julien Nordmann. Patient was seen late last week in the thoracic oncology clinic prior to diagnosis. Following day the patient underwent  VIDEO BRONCHOSCOPY WITH ENDOBRONCHIAL ULTRASOUND (N/A)  INSERTION PORT-A-CATH left with sonosite and fluro guidance  Transbronchial biopsy  Endo bronchial biopsy      Tissue from this procedure revealed small cell lung cancer. Patient tolerated the procedure well. Earlier this week the patient's breathing worsened and she presented to the emergency room. She underwent repeat a chest CT scan to rule out pulmonary embolus. 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  She was admitted and placed on anticoagulation. She underwent a MRI the brain which showed at least 26 metastasis involving the infratentorial and supratentorial area. With This information the patient is now seen in radiation oncology for consideration for palliative radiation therapy directed at the brain and chest area.                   ALLERGIES:  is allergic to actos and vicodin.  Meds: No current facility-administered medications for this encounter.   No current outpatient prescriptions on file.   Facility-Administered Medications Ordered in Other Encounters  Medication Dose Route Frequency Provider Last Rate Last Dose  . albuterol (PROVENTIL) (2.5 MG/3ML) 0.083% nebulizer solution 2.5 mg  2.5  mg Nebulization Q2H PRN Kinnie Feil, MD      . ALPRAZolam Duanne Moron) tablet 0.5 mg  0.5 mg Oral BID PRN Melton Alar, PA-C   0.5 mg at 12/18/13 1756  . atorvastatin (LIPITOR) tablet 40 mg  40 mg Oral Daily Kinnie Feil, MD   40 mg at 12/18/13 0944  . chlorpheniramine-HYDROcodone (TUSSIONEX) 10-8 MG/5ML suspension 5 mL  5 mL Oral Q12H PRN Kinnie Feil, MD      . dexamethasone (DECADRON) tablet 4 mg  4 mg Oral 3 times per day Curt Bears, MD   4 mg at 12/18/13 1312  . diphenhydrAMINE (BENADRYL) capsule 25 mg  25 mg Oral QHS PRN Melton Alar, PA-C      . escitalopram (LEXAPRO) tablet 10 mg  10 mg Oral Daily Kinnie Feil, MD   10 mg at 12/18/13 0944  . fenofibrate tablet 160 mg  160 mg Oral Daily Kinnie Feil, MD   160 mg at 12/18/13 0944  . fluticasone (FLOVENT HFA) 44 MCG/ACT inhaler 1 puff  1 puff Inhalation BID Randa Spike, RPH   1 puff at 12/18/13 2002  . heparin ADULT infusion 100 units/mL (25000 units/250 mL)  1,800 Units/hr Intravenous Continuous Leann Trefz Poindexter, RPH 18 mL/hr at 12/18/13 1440 1,800 Units/hr at 12/18/13 1440  . insulin aspart (novoLOG) injection 0-9 Units  0-9 Units Subcutaneous TID WC Kinnie Feil, MD   7 Units at 12/18/13 1615  . levofloxacin (LEVAQUIN) tablet 500 mg  500 mg  Oral Daily Kinnie Feil, MD   500 mg at 12/18/13 0944  . levothyroxine (SYNTHROID, LEVOTHROID) tablet 150 mcg  150 mcg Oral QAC breakfast Kinnie Feil, MD   150 mcg at 12/18/13 0737  . loratadine (CLARITIN) tablet 10 mg  10 mg Oral Daily Kinnie Feil, MD   10 mg at 12/18/13 0945  . morphine 2 MG/ML injection 2 mg  2 mg Intravenous Q4H PRN Kinnie Feil, MD   2 mg at 12/18/13 1915  . nitroGLYCERIN (NITROSTAT) SL tablet 0.4 mg  0.4 mg Sublingual Q5 min PRN Varney Biles, MD   0.4 mg at 12/17/13 0647  . ondansetron (ZOFRAN) tablet 4 mg  4 mg Oral Q6H PRN Kinnie Feil, MD       Or  . ondansetron (ZOFRAN) injection 4 mg  4 mg Intravenous Q6H PRN  Kinnie Feil, MD      . oxyCODONE (Oxy IR/ROXICODONE) immediate release tablet 5 mg  5 mg Oral Q4H PRN Kinnie Feil, MD   5 mg at 12/18/13 1440  . pantoprazole (PROTONIX) EC tablet 40 mg  40 mg Oral Daily Kinnie Feil, MD   40 mg at 12/18/13 0944  . sodium chloride 0.9 % injection 3 mL  3 mL Intravenous Q12H Kinnie Feil, MD   10 mL at 12/18/13 0945    Physical Findings: The patient is in no acute distress. Patient is alert and oriented.  vitals were not taken for this visit.. She is sitting comfortably in a hospital wheelchair. She has oxygen in place by nasal cannula.   Lab Findings: Lab Results  Component Value Date   WBC 4.5 12/18/2013   HGB 10.6* 12/18/2013   HCT 33.3* 12/18/2013   MCV 90.7 12/18/2013   PLT 251 12/18/2013      Radiographic Findings: Dg Chest 2 View  12/13/2013   CLINICAL DATA:  Status post bronchoscopy  EXAM: CHEST  2 VIEW  COMPARISON:  12/13/2013  FINDINGS: Cardiac shadow is within normal limits. A new left-sided chest wall port is seen with the catheter tip in the mid to distal superior vena cava. No pneumothorax is noted. Persistent left hilar/ mediastinal mass lesion is seen. There is decreased inspiratory effort with atelectatic changes in the left lung likely related to obstructive change from the known mass lesion. The right lung is clear.  IMPRESSION: No pneumothorax following port placement.  Poor aeration of the left lung likely related to central compression and poor inspiratory effort.   Electronically Signed   By: Inez Catalina M.D.   On: 12/13/2013 11:28   Chest 2 View  12/13/2013   CLINICAL DATA:  Preoperative  EXAM: CHEST  2 VIEW  COMPARISON:  Prior CT from 12/09/2013  FINDINGS: Abnormal convexity along the left mediastinal border overlying the region of the U aortic knob is compatible with known left hilar/mediastinal mass within this region. Mild cardiomegaly is stable.  Lungs are otherwise clear without focal infiltrate, pulmonary edema,  or pleural effusion. No pneumothorax.  No acute osseous abnormality.  IMPRESSION: 1. Stable appearance of left hilar/ mediastinal mass as compared to recent CT from 12/09/2013. 2. No other acute cardiopulmonary abnormality identified.   Electronically Signed   By: Jeannine Boga M.D.   On: 12/13/2013 06:19   Ct Angio Chest Pe W/cm &/or Wo Cm  12/17/2013   CLINICAL DATA:  Chest pain and shortness of breath.  Lung cancer.  EXAM: CT ANGIOGRAPHY CHEST WITH CONTRAST  TECHNIQUE: Multidetector  CT imaging of the chest was performed using the standard protocol during bolus administration of intravenous contrast. Multiplanar CT image reconstructions and MIPs were obtained to evaluate the vascular anatomy.  CONTRAST:  158mL OMNIPAQUE IOHEXOL 350 MG/ML SOLN  COMPARISON:  CT chest 12/09/2013.  FINDINGS: Re-demonstrated is a large mass centered at the left hilum and invading the adjacent mediastinum including AP window. Pericardial invasion is present. 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 Small left lower lobe branches noted previously no longer fill, likely due to thrombosis. No developing left lung infarction. Slight increase left pleural effusion could be malignant or reactive. Pericardial invasion. Slight decrease pericardial effusion. No acute PE on the right. Unchanged spiculated nodule left upper lobe. No right lung infiltrates. Stable mediastinal adenopathy.  Stable left adrenal mass. Hepatic steatosis. Normal aorta. No destructive osseous lesions.  Review of the MIP images confirms the above findings.  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 No developing left lung infarction. Slight increase left pleural effusion. Pericardial invasion with Stable mediastinal adenopathy.   Electronically Signed   By: Rolla Flatten M.D.   On: 12/17/2013 08:14   Ct Angio Chest Pe  W/cm &/or Wo Cm  12/09/2013   CLINICAL DATA:  Chest pain and severe cough. Pulmonary embolism in 1980. Ex-smoker.  BUN and creatinine were obtained on site at [place] at  [address]  Results:  BUN 14 mg/dL,  Creatinine 0.6 mg/dL.  EXAM: CT ANGIOGRAPHY CHEST WITH CONTRAST  TECHNIQUE: Multidetector CT imaging of the chest was performed using the standard protocol during bolus administration of intravenous contrast. Multiplanar CT image reconstructions and MIPs were obtained to evaluate the vascular anatomy.  CONTRAST:  17mL OMNIPAQUE IOHEXOL 350 MG/ML SOLN  COMPARISON:  Chest radiographs dated 02/03/2009 and chest CT dated 05/20/2007.  FINDINGS: Large mass centered at the left hilum and invading the adjacent mediastinum, including the AP window. This mass measures 8.9 x 5.6 cm on image number 44 of series 4 and 6.8 cm in length on image number 50 of series 600. This is compressing and causing almost complete occlusion of the distal left main pulmonary artery with lack of opacification of the majority of the left pulmonary artery branches.  There is also a new spiculated nodule in the left upper lobe measuring 1.2 cm in maximum diameter on image number 27 of series 5 which is also a new. There is also a new 4 mm subpleural nodule in the left upper lobe on image number 27 of series 5.  Also noted are multiple enlarged mediastinal lymph nodes. The majority of these are in the AP window, including a node with a short axis diameter of 13.5 mm on image number 45. A proximal right hilar node has a short axis diameter of 10 mm on image number 44.  There is an interval oval, mass-like area of in the pericardium on the left, measuring 2.8 x 1.0 cm on image number 69. There is also a small pericardial effusion with a maximum thickness of 7 mm.  There are multiple areas of patchy density in the left upper lobe, lingula and left lower lobe. There is also an interval 3 mm subpleural nodule in the left lower  lobe on image number 89, at the inferior aspect of somewhat nodular patchy opacity.  A small left pleural effusion is noted. On the last  image, there is a suggestion of a partially imaged left adrenal mass, measuring 1.3 x 0.9 cm. The more inferior portion of the left adrenal gland is not included and the majority of the right adrenal gland is not included.  The remainder of the lungs are mildly hyperexpanded with mildly prominent interstitial markings. Diffuse peribronchial thickening is also noted.  The pulmonary arteries on the right are normally opacified with no pulmonary emboli seen.  Mild diffuse low density of the liver is noted in the upper abdomen. Thoracic spine degenerative changes are noted.  Review of the MIP images confirms the above findings.  IMPRESSION: 1. 8.9 x 6.8 x 5.6 cm left hilar and mediastinal mass causing almost complete occlusion of the distal left main pulmonary artery with lack of opacification of the majority of the pulmonary arteries on the left. 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 2. Mediastinal and right hilar metastatic adenopathy. 3. Possible left adrenal metastasis. 4. 1.2 cm spiculated left upper lobe nodule, concerning for a primary lung carcinoma. 5. 4 mm left upper lobe subpleural nodules suspicious for a metastasis. 6. Possible left pericardial metastasis measuring 2.8 x 1.0 cm. 7. Patchy opacities most likely representing areas of pulmonary infarction in the left upper lobe, lingula and left lower lobe. An area in the left lower lobe has nodular components. 8. Small left pleural effusion. 9. Small pericardial effusion. 10. COPD. 11. Mild diffuse hepatic steatosis.  These results were called by telephone at the time of interpretation on 12/09/2013 at 12:56 PM to Dr. Antony Contras , who verbally acknowledged these results.   Electronically Signed   By: Enrique Sack M.D.   On: 12/09/2013 12:30   Mr Jeri Cos IO  Contrast  12/18/2013   CLINICAL DATA:  Small cell lung cancer, evaluate for brain metastasis.  EXAM: MRI HEAD WITHOUT AND WITH CONTRAST  TECHNIQUE: Multiplanar, multiecho pulse sequences of the brain and surrounding structures were obtained without and with intravenous contrast.  CONTRAST:  59mL MULTIHANCE GADOBENATE DIMEGLUMINE 529 MG/ML IV SOLN  COMPARISON:  CT HEAD WO/W CM dated 05/19/2005  FINDINGS: At least 7 infratentorial mildly enhancing lesions with slight reduced diffusion consistent with hypercellularity consistent with metastasis, largest in the left cerebellum measuring 8 x 9 mm.  At least 19 supratentorial enhancing hypermetabolic metastasis seen, with surrounding bright FLAIR T2 hyperintense vasogenic edema, including a 8 x 9 mm lesion in right temporal lobe, tiny lesions in the right posterior frontal lobe, motor cortex. 3-4 mm enhancing lesions in the caudate bilaterally. No midline shift or mass effect. Tiny foci of susceptibility artifact associated with a few of the metastasis in the supratentorial brain. A few of the metastasis shows central hypo enhancement consistent with cystic necrosis. No reduced diffusion to suggest acute ischemia. The ventricles and sulci are overall normal for patient's age.  Additional patchy FLAIR T2 hyperintense signal in the pons, with scattered subcentimeter supratentorial T2 hyperintensity suggesting sequelae of chronic small vessel ischemic disease. Right occipital developmental venous anomaly, benign finding.  No abnormal extra-axial fluid collections. Though many of the intracranial metastasis are within the cortex/gray-white matter junction, no convincing evidence of dural or leptomeningeal metastatic disease. Normal major intracranial vascular flow voids seen at the skull base.  Ocular globes and orbital contents are unremarkable. No paranasal sinus air-fluid levels. Mastoid air cells appear well-aerated. Of note, there is mild deformity of the left dorsal  spinal cord due to apparent C1-2 arthropathy. No abnormal sellar  expansion. No cerebellar tonsillar ectopia.  IMPRESSION: At least 7 infratentorial and 19 supratentorial subcentimeter enhancing intraparenchymal lesions consistent with metastatic disease, some of which are slightly hemorrhagic and/or cystic. Associated mild vasogenic edema without significant mass effect.  Mild-to-moderate additional white matter changes suggest sequelae of chronic small vessel ischemic disease, less likely nonenhancing metastatic disease.   Electronically Signed   By: Elon Alas   On: 12/18/2013 00:23   Dg Fluoro Guide Cv Line-no Report  12/13/2013   CLINICAL DATA: inserting port   FLOURO GUIDE CV LINE  Fluoroscopy was utilized by the requesting physician.  No radiographic  interpretation.    Mm Screening Breast Tomo Bilateral  12/05/2013   CLINICAL DATA:  Screening.  EXAM: DIGITAL SCREENING BILATERAL MAMMOGRAM WITH 3D TOMO WITH CAD  COMPARISON:  Previous exam(s).  ACR Breast Density Category b: There are scattered areas of fibroglandular density.  FINDINGS: There are no findings suspicious for malignancy. Images were processed with CAD.  IMPRESSION: No mammographic evidence of malignancy. A result letter of this screening mammogram will be mailed directly to the patient.  RECOMMENDATION: Screening mammogram in one year. (Code:[id number]-B-01Y)  BI-RADS CATEGORY  1: Negative.   Electronically Signed   By: Lovey Newcomer M.D.   On: 12/05/2013 11:00    Impression: Extensive stage small cell lung cancer. Patient would be a good candidate for radiation therapy directed at the whole brain. Since the patient will receive treatments directed to the brain she would not be a candidate for immediate chemotherapy and I therefore would recommend radiation therapy directed to the chest.  Plan:  Patient will proceed with planning and treatment later today. Anticipate 10 radiation treatments directed at the whole brain and 10 directed at the  left central chest area.  ____________________________________ Blair Promise, MD

## 2013-12-18 NOTE — Progress Notes (Signed)
ANTICOAGULATION CONSULT NOTE - Follow Up Consult  Pharmacy Consult for Heparin Indication: pulmonary embolus  Allergies  Allergen Reactions  . Actos [Pioglitazone] Swelling    Low blood sugar  . Vicodin [Hydrocodone-Acetaminophen] Itching    Patient Measurements: Height: 5\' 6"  (167.6 cm) Weight: 184 lb 8.4 oz (83.7 kg) IBW/kg (Calculated) : 59.3 Heparin Dosing Weight:   Vital Signs: Temp: 97.7 F (36.5 C) (04/22 0000) Temp src: Axillary (04/22 0000) BP: 139/80 mmHg (04/22 0012) Pulse Rate: 80 (04/22 0012)  Labs:  Recent Labs  12/17/13 0620 12/17/13 1030 12/17/13 1545 12/17/13 2040 12/18/13 0315  HGB 11.0*  --   --   --  10.6*  HCT 33.6*  --   --   --  33.3*  PLT 267  --   --   --  251  HEPARINUNFRC  --   --  0.10* 0.25* 0.29*  CREATININE 0.53  --   --   --  0.81  TROPONINI  --  <0.30 <0.30 <0.30  --     Estimated Creatinine Clearance: 85.6 ml/min (by C-G formula based on Cr of 0.81).   Medications:  Infusions:  . heparin 1,800 Units/hr (12/18/13 0011)    Assessment: Patient with heparin level low after restart.  Level drawn early.  Just below goal.  No issues with drip per RN.  Goal of Therapy:  Heparin level 0.3-0.7 units/ml Monitor platelets by anticoagulation protocol: Yes   Plan:  Will not change drip at this time.  Feel the level more than likely would have been at goal if drawn at correct time.  Will recheck level at 1100.  Judge Duque [employer]. 12/18/2013,4:30 AM

## 2013-12-18 NOTE — Progress Notes (Signed)
  Radiation Oncology         [phone] ________________________________  Name: Carol Bond MRN: [id number]  Date: 12/18/2013  DOB: 1956-02-16  SIMULATION AND TREATMENT PLANNING NOTE  DIAGNOSIS:  Lung cancer   Primary site: Lung (Left)   Staging method: AJCC 7th Edition   Clinical free text: Extensive stage small cell lung cancer   Clinical: Stage IV (T3, N2, M1b)    Summary: Stage IV (T3, N2, M1b)   NARRATIVE:  The patient was brought to the [facility].  Identity was confirmed.  All relevant records and images related to the planned course of therapy were reviewed.  The patient freely provided informed written consent to proceed with treatment after reviewing the details related to the planned course of therapy. The consent form was witnessed and verified by the simulation staff.  Then, the patient was set-up in a stable reproducible  supine position for radiation therapy.  CT images were obtained.  Surface markings were placed.  The CT images were loaded into the planning software.  Then the target and avoidance structures were contoured.  Treatment planning then occurred.  The radiation prescription was entered and confirmed.  Then, I designed and supervised the construction of a total of 8 medically necessary complex treatment devices.  I have requested : 3D Simulation  I have requested a DVH of the following structures: Gross tumor volume, lungs, spinal cord.  I have ordered:dose calc.  PLAN:  The patient will receive 30 Gy in 10 fractions directed at both the left central chest and whole brain.  ________________________________  -----------------------------------  Blair Promise, PhD, MD

## 2013-12-18 NOTE — Progress Notes (Signed)
ANTICOAGULATION CONSULT NOTE - Follow Up Consult  Pharmacy Consult for Heparin Indication: pulmonary embolus  Allergies  Allergen Reactions  . Actos [Pioglitazone] Swelling    Low blood sugar  . Vicodin [Hydrocodone-Acetaminophen] Itching    Patient Measurements: Height: 5\' 6"  (167.6 cm) Weight: 184 lb 8.4 oz (83.7 kg) IBW/kg (Calculated) : 59.3 Heparin Dosing Weight: 76kg  Vital Signs: Temp: 97.2 F (36.2 C) (04/22 0800) Temp src: Oral (04/22 0800) BP: 128/74 mmHg (04/22 0735) Pulse Rate: 81 (04/22 1000)  Labs:  Recent Labs  12/17/13 0620 12/17/13 1030  12/17/13 1545 12/17/13 2040 12/18/13 0315 12/18/13 1055  HGB 11.0*  --   --   --   --  10.6*  --   HCT 33.6*  --   --   --   --  33.3*  --   PLT 267  --   --   --   --  251  --   HEPARINUNFRC  --   --   < > 0.10* 0.25* 0.29* 0.37  CREATININE 0.53  --   --   --   --  0.81  --   TROPONINI  --  <0.30  --  <0.30 <0.30  --   --   < > = values in this interval not displayed.  Estimated Creatinine Clearance: 85.6 ml/min (by C-G formula based on Cr of 0.81).   Medications:  Infusions:  . heparin 1,800 Units/hr (12/18/13 0011)    Assessment: Carol Bond presents 4/21 with chest pain and SOB. Recent diagnosis of lung cancer s/p biopsy 4/18. She has history of previous PE in 1980. CTA 4/21 reveals compelte thrombosis of L pulmonary artery branches distal to the L mainstem bronchus.  Pharmacy is consulted to start heparin infusion.   Noted diagnosis of lung cancer (chemo starting 4/22 delayed) and new brain mets.  Per onc, cont anticoagulation, despite risk of hemorrhagic brain lesions.   Heparin level 0.37 is at goal.  CBC: Hgb decreased to 10.6, Plt 251.  No bleeding or complications reported.  SCr 0.81, CrCl ~ 85 ml/min    Goal of Therapy:  Heparin level 0.3-0.7 units/ml Monitor platelets by anticoagulation protocol: Yes   Plan:   Continue heparin IV infusion at 1800 units/hr (18 ml/hr)  Heparin level in 8  hours to confirm therapeutic level  Daily heparin level and CBC  Continue to monitor H&H and platelets  Thank you for the consult,  Gretta Arab PharmD, BCPS Pager [phone] 12/18/2013 11:24 AM

## 2013-12-18 NOTE — Telephone Encounter (Signed)
This nurse received and read message today.  No action as per patient station she has been admitted.

## 2013-12-18 NOTE — Clinical Documentation Improvement (Signed)
Possible Clinical Conditions?   _______Diabetes Type 1  or 2 _______Controlled or Uncontrolled  Manifestations:  _______DM retinopathy  _______DM PVD _______DM neuropathy   _______DM nephropathy _______Other Condition _______Cannot Clinically determine     Risk Factors: Patient with a history of diabetes mellitus per 4/21 H&P.  Diagnostics: 4/21:  Hgb A1c: 8.6 4/21:  Mean plasma glucose: 200.  Thank You, Theron Arista, Clinical Documentation Specialist:  [phone]  [facility] [employer] [facility]

## 2013-12-18 NOTE — Progress Notes (Signed)
SUBJECTIVE:  Still complains of pleuritic CP  OBJECTIVE:   Vitals:   Filed Vitals:   12/18/13 0012 12/18/13 0346 12/18/13 0700 12/18/13 0735  BP: 139/80 120/73  128/74  Pulse: 80 79 78 79  Temp:  98.3 F (36.8 C)    TempSrc:  Oral    Resp: 19 20 19 15   Height:      Weight:      SpO2: 99% 99% 100% 98%   I&O's:   Intake/Output Summary (Last 24 hours) at 12/18/13 0850 Last data filed at 12/18/13 0700  Gross per 24 hour  Intake 1112.28 ml  Output   2400 ml  Net -1287.72 ml   TELEMETRY: Reviewed telemetry pt in NSR:     PHYSICAL EXAM General: Well developed, well nourished, in no acute distress Head: Eyes PERRLA, No xanthomas.   Normal cephalic and atramatic  Lungs:   Clear bilaterally to auscultation and percussion. Heart:   HRRR S1 S2 Pulses are 2+ & equal. Abdomen: Bowel sounds are positive, abdomen soft and non-tender without masses Extremities:   No clubbing, cyanosis or edema.  DP +1 Neuro: Alert and oriented X 3. Psych:  Good affect, responds appropriately   LABS: Basic Metabolic Panel:  Recent Labs  12/17/13 0620 12/18/13 0315  NA 141 139  K 4.0 4.9  CL 102 101  CO2 25 31  GLUCOSE 127* 231*  BUN 19 18  CREATININE 0.53 0.81  CALCIUM 9.7 9.3   Liver Function Tests: No results found for this basename: AST, ALT, ALKPHOS, BILITOT, PROT, ALBUMIN,  in the last 72 hours No results found for this basename: LIPASE, AMYLASE,  in the last 72 hours CBC:  Recent Labs  12/17/13 0620 12/18/13 0315  WBC 4.0 4.5  NEUTROABS 2.1  --   HGB 11.0* 10.6*  HCT 33.6* 33.3*  MCV 88.2 90.7  PLT 267 251   Cardiac Enzymes:  Recent Labs  12/17/13 1030 12/17/13 1545 12/17/13 2040  TROPONINI <0.30 <0.30 <0.30   BNP: No components found with this basename: POCBNP,  D-Dimer: No results found for this basename: DDIMER,  in the last 72 hours Hemoglobin A1C:  Recent Labs  12/17/13 1030  HGBA1C 8.6*   Fasting Lipid Panel: No results found for this basename:  CHOL, HDL, LDLCALC, TRIG, CHOLHDL, LDLDIRECT,  in the last 72 hours Thyroid Function Tests: No results found for this basename: TSH, T4TOTAL, FREET3, T3FREE, THYROIDAB,  in the last 72 hours Anemia Panel: No results found for this basename: VITAMINB12, FOLATE, FERRITIN, TIBC, IRON, RETICCTPCT,  in the last 72 hours Coag Panel:   Lab Results  Component Value Date   INR 0.92 12/13/2013    RADIOLOGY: Dg Chest 2 View  12/13/2013   CLINICAL DATA:  Status post bronchoscopy  EXAM: CHEST  2 VIEW  COMPARISON:  12/13/2013  FINDINGS: Cardiac shadow is within normal limits. A new left-sided chest wall port is seen with the catheter tip in the mid to distal superior vena cava. No pneumothorax is noted. Persistent left hilar/ mediastinal mass lesion is seen. There is decreased inspiratory effort with atelectatic changes in the left lung likely related to obstructive change from the known mass lesion. The right lung is clear.  IMPRESSION: No pneumothorax following port placement.  Poor aeration of the left lung likely related to central compression and poor inspiratory effort.   Electronically Signed   By: Inez Catalina M.D.   On: 12/13/2013 11:28   Chest 2 View  12/13/2013   CLINICAL  DATA:  Preoperative  EXAM: CHEST  2 VIEW  COMPARISON:  Prior CT from 12/09/2013  FINDINGS: Abnormal convexity along the left mediastinal border overlying the region of the U aortic knob is compatible with known left hilar/mediastinal mass within this region. Mild cardiomegaly is stable.  Lungs are otherwise clear without focal infiltrate, pulmonary edema, or pleural effusion. No pneumothorax.  No acute osseous abnormality.  IMPRESSION: 1. Stable appearance of left hilar/ mediastinal mass as compared to recent CT from 12/09/2013. 2. No other acute cardiopulmonary abnormality identified.   Electronically Signed   By: Jeannine Boga M.D.   On: 12/13/2013 06:19   Ct Angio Chest Pe W/cm &/or Wo Cm  12/17/2013   CLINICAL DATA:  Chest  pain and shortness of breath.  Lung cancer.  EXAM: CT ANGIOGRAPHY CHEST WITH CONTRAST  TECHNIQUE: Multidetector CT imaging of the chest was performed using the standard protocol during bolus administration of intravenous contrast. Multiplanar CT image reconstructions and MIPs were obtained to evaluate the vascular anatomy.  CONTRAST:  187mL OMNIPAQUE IOHEXOL 350 MG/ML SOLN  COMPARISON:  CT chest 12/09/2013.  FINDINGS: Re-demonstrated is a large mass centered at the left hilum and invading the adjacent mediastinum including AP window. Pericardial invasion is present. 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 Small left lower lobe branches noted previously no longer fill, likely due to thrombosis. No developing left lung infarction. Slight increase left pleural effusion could be malignant or reactive. Pericardial invasion. Slight decrease pericardial effusion. No acute PE on the right. Unchanged spiculated nodule left upper lobe. No right lung infiltrates. Stable mediastinal adenopathy.  Stable left adrenal mass. Hepatic steatosis. Normal aorta. No destructive osseous lesions.  Review of the MIP images confirms the above findings.  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 No developing left lung infarction. Slight increase left pleural effusion. Pericardial invasion with Stable mediastinal adenopathy.   Electronically Signed   By: Rolla Flatten M.D.   On: 12/17/2013 08:14   Ct Angio Chest Pe W/cm &/or Wo Cm  12/09/2013   CLINICAL DATA:  Chest pain and severe cough. Pulmonary embolism in 1980. Ex-smoker.  BUN and creatinine were obtained on site at [place] at  [address]  Results:  BUN 14 mg/dL,  Creatinine 0.6 mg/dL.  EXAM: CT ANGIOGRAPHY CHEST WITH CONTRAST  TECHNIQUE: Multidetector CT imaging of the chest was performed using the standard protocol  during bolus administration of intravenous contrast. Multiplanar CT image reconstructions and MIPs were obtained to evaluate the vascular anatomy.  CONTRAST:  134mL OMNIPAQUE IOHEXOL 350 MG/ML SOLN  COMPARISON:  Chest radiographs dated 02/03/2009 and chest CT dated 05/20/2007.  FINDINGS: Large mass centered at the left hilum and invading the adjacent mediastinum, including the AP window. This mass measures 8.9 x 5.6 cm on image number 44 of series 4 and 6.8 cm in length on image number 50 of series 600. This is compressing and causing almost complete occlusion of the distal left main pulmonary artery with lack of opacification of the majority of the left pulmonary artery branches.  There is also a new spiculated nodule in the left upper lobe measuring 1.2 cm in maximum diameter on image number 27 of series 5 which is also a new. There is also a new 4 mm subpleural nodule in the left upper lobe on image number 27 of series 5.  Also noted are multiple enlarged mediastinal lymph nodes. The majority of these are in the AP window, including a node with a short axis diameter of 13.5 mm on image number 45. A proximal right hilar node has a short axis diameter of 10 mm on image number 44.  There is an interval oval, mass-like area of in the pericardium on the left, measuring 2.8 x 1.0 cm on image number 69. There is also a small pericardial effusion with a maximum thickness of 7 mm.  There are multiple areas of patchy density in the left upper lobe, lingula and left lower lobe. There is also an interval 3 mm subpleural nodule in the left lower lobe on image number 89, at the inferior aspect of somewhat nodular patchy opacity.  A small left pleural effusion is noted. On the last image, there is a suggestion of a partially imaged left adrenal mass, measuring 1.3 x 0.9 cm. The more inferior portion of the left adrenal gland is not included and the majority of the right adrenal gland is not included.  The remainder of the lungs  are mildly hyperexpanded with mildly prominent interstitial markings. Diffuse peribronchial thickening is also noted.  The pulmonary arteries on the right are normally opacified with no pulmonary emboli seen.  Mild diffuse low density of the liver is noted in the upper abdomen. Thoracic spine degenerative changes are noted.  Review of the MIP images confirms the above findings.  IMPRESSION: 1. 8.9 x 6.8 x 5.6 cm left hilar and mediastinal mass causing almost complete occlusion of the distal left main pulmonary artery with lack of opacification of the majority of the pulmonary arteries on the left. 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 2. Mediastinal and right hilar metastatic adenopathy. 3. Possible left adrenal metastasis. 4. 1.2 cm spiculated left upper lobe nodule, concerning for a primary lung carcinoma. 5. 4 mm left upper lobe subpleural nodules suspicious for a metastasis. 6. Possible left pericardial metastasis measuring 2.8 x 1.0 cm. 7. Patchy opacities most likely representing areas of pulmonary infarction in the left upper lobe, lingula and left lower lobe. An area in the left lower lobe has nodular components. 8. Small left pleural effusion. 9. Small pericardial effusion. 10. COPD. 11. Mild diffuse hepatic steatosis.  These results were called by telephone at the time of interpretation on 12/09/2013 at 12:56 PM to Dr. Antony Contras , who verbally acknowledged these results.   Electronically Signed   By: Enrique Sack M.D.   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  EXAM: MRI HEAD WITHOUT AND WITH CONTRAST  TECHNIQUE: Multiplanar, multiecho pulse sequences of the brain and surrounding structures were obtained without and with intravenous contrast.  CONTRAST:  91mL MULTIHANCE GADOBENATE DIMEGLUMINE 529 MG/ML IV SOLN  COMPARISON:  CT HEAD WO/W CM dated 05/19/2005   FINDINGS: At least 7 infratentorial mildly enhancing lesions with slight reduced diffusion consistent with hypercellularity consistent with metastasis, largest in the left cerebellum measuring 8 x 9 mm.  At least 19 supratentorial enhancing hypermetabolic metastasis seen, with surrounding bright FLAIR T2 hyperintense vasogenic edema, including a 8 x 9 mm lesion in right temporal lobe, tiny lesions in the right posterior frontal lobe, motor cortex. 3-4 mm enhancing lesions in the caudate bilaterally. No midline shift or mass effect. Tiny foci of susceptibility artifact associated with a few of  the metastasis in the supratentorial brain. A few of the metastasis shows central hypo enhancement consistent with cystic necrosis. No reduced diffusion to suggest acute ischemia. The ventricles and sulci are overall normal for patient's age.  Additional patchy FLAIR T2 hyperintense signal in the pons, with scattered subcentimeter supratentorial T2 hyperintensity suggesting sequelae of chronic small vessel ischemic disease. Right occipital developmental venous anomaly, benign finding.  No abnormal extra-axial fluid collections. Though many of the intracranial metastasis are within the cortex/gray-white matter junction, no convincing evidence of dural or leptomeningeal metastatic disease. Normal major intracranial vascular flow voids seen at the skull base.  Ocular globes and orbital contents are unremarkable. No paranasal sinus air-fluid levels. Mastoid air cells appear well-aerated. Of note, there is mild deformity of the left dorsal spinal cord due to apparent C1-2 arthropathy. No abnormal sellar expansion. No cerebellar tonsillar ectopia.  IMPRESSION: At least 7 infratentorial and 19 supratentorial subcentimeter enhancing intraparenchymal lesions consistent with metastatic disease, some of which are slightly hemorrhagic and/or cystic. Associated mild vasogenic edema without significant mass effect.  Mild-to-moderate  additional white matter changes suggest sequelae of chronic small vessel ischemic disease, less likely nonenhancing metastatic disease.   Electronically Signed   By: Elon Alas   On: 12/18/2013 00:23   Dg Fluoro Guide Cv Line-no Report  12/13/2013   CLINICAL DATA: inserting port   FLOURO GUIDE CV LINE  Fluoroscopy was utilized by the requesting physician.  No radiographic  interpretation.    Mm Screening Breast Tomo Bilateral  12/05/2013   CLINICAL DATA:  Screening.  EXAM: DIGITAL SCREENING BILATERAL MAMMOGRAM WITH 3D TOMO WITH CAD  COMPARISON:  Previous exam(s).  ACR Breast Density Category b: There are scattered areas of fibroglandular density.  FINDINGS: There are no findings suspicious for malignancy. Images were processed with CAD.  IMPRESSION: No mammographic evidence of malignancy. A result letter of this screening mammogram will be mailed directly to the patient.  RECOMMENDATION: Screening mammogram in one year. (Code:[id number])  BI-RADS CATEGORY  1: Negative.   Electronically Signed   By: Lovey Newcomer M.D.   On: 12/05/2013 11:00   IMPRESSION:  Principal Problem:  Pulmonary embolus  Active Problems:  Abnormal finding on EKG - subtle inferior ST elevations  Lung cancer  Hypothyroid  Diabetes  DOE (dyspnea on exertion)  PE (pulmonary embolism)   ?Mild Inferior STE without reciprocal changes. -- I do not think this represents a STEMI,  No RWMAs and hyperdynamic LVF on echo as well as normal cardiac markers not c/s MI CP that as described is more pleuritic that anginal  Known Large L sided PE - ? If ECG changes are related to RV strain.  RVF and LVF normal on echo with hyperdynamic LVF.  No evidence of pulmonary HTN  RECOMMENDATION:  Anticoagulate with heparin as ordered  With normal LVF, no RWMAs and normal cardiac markers and pleuritic CP in setting of acute PE, CP most likely related to PE and no further cardiac workup recommended at this time.    Sueanne Margarita, MD    12/18/2013  8:50 AM

## 2013-12-19 ENCOUNTER — Ambulatory Visit
Admit: 2013-12-19 | Discharge: 2013-12-19 | Disposition: A | Discharge status: Home / Self Care | Payer: 59 | Attending: Radiation Oncology | Admitting: Radiation Oncology

## 2013-12-19 LAB — COMPREHENSIVE METABOLIC PANEL
ALBUMIN: 3.6 g/dL (ref 3.5–5.2)
ALT: 48 U/L — AB (ref 0–35)
AST: 24 U/L (ref 0–37)
Alkaline Phosphatase: 54 U/L (ref 39–117)
BUN: 21 mg/dL (ref 6–23)
CALCIUM: 9.7 mg/dL (ref 8.4–10.5)
CO2: 28 meq/L (ref 19–32)
Chloride: 100 mEq/L (ref 96–112)
Creatinine, Ser: 0.78 mg/dL (ref 0.50–1.10)
GFR calc Af Amer: >90 mL/min (ref >90)
Glucose, Bld: 268 mg/dL — ABNORMAL HIGH (ref 70–99)
Potassium: 4.3 mEq/L (ref 3.7–5.3)
SODIUM: 140 meq/L (ref 137–147)
Total Bilirubin: <0.2 mg/dL — ABNORMAL LOW (ref 0.3–1.2)
Total Protein: 7 g/dL (ref 6.0–8.3)

## 2013-12-19 LAB — GLUCOSE, CAPILLARY
GLUCOSE-CAPILLARY: 345 mg/dL — AB (ref 70–99)
Glucose-Capillary: 265 mg/dL — ABNORMAL HIGH (ref 70–99)
Glucose-Capillary: 300 mg/dL — ABNORMAL HIGH (ref 70–99)

## 2013-12-19 LAB — CBC
HCT: 32.4 % — ABNORMAL LOW (ref 36.0–46.0)
HEMOGLOBIN: 10.3 g/dL — AB (ref 12.0–15.0)
MCH: 28.3 pg (ref 26.0–34.0)
MCHC: 31.8 g/dL (ref 30.0–36.0)
MCV: 89 fL (ref 78.0–100.0)
PLATELETS: 257 10*3/uL (ref 150–400)
RBC: 3.64 MIL/uL — AB (ref 3.87–5.11)
RDW: 13.9 % (ref 11.5–15.5)
WBC: 6 10*3/uL (ref 4.0–10.5)

## 2013-12-19 LAB — HEPARIN LEVEL (UNFRACTIONATED): Heparin Unfractionated: 0.81 IU/mL — ABNORMAL HIGH (ref 0.30–0.70)

## 2013-12-19 MED ORDER — ENOXAPARIN SODIUM 40 MG/0.4ML ~~LOC~~ SOLN
40.0000 mg | SUBCUTANEOUS | Status: DC
  Filled 2013-12-19: qty 0.4

## 2013-12-19 MED ORDER — HEPARIN (PORCINE) IN NACL 100-0.45 UNIT/ML-% IJ SOLN
1500.0000 [IU]/h | INTRAMUSCULAR | Status: DC
  Administered 2013-12-19: 1500 [IU]/h via INTRAVENOUS
  Filled 2013-12-19: qty 250

## 2013-12-19 MED ORDER — SENNOSIDES-DOCUSATE SODIUM 8.6-50 MG PO TABS
1.0000 | ORAL_TABLET | Freq: Two times a day (BID) | ORAL | Status: DC
  Administered 2013-12-19 – 2013-12-20 (×2): 1 via ORAL
  Filled 2013-12-19 (×3): qty 1

## 2013-12-19 MED ORDER — ENOXAPARIN SODIUM 40 MG/0.4ML ~~LOC~~ SOLN
40.0000 mg | SUBCUTANEOUS | Status: DC
  Administered 2013-12-19 – 2013-12-20 (×2): 40 mg via SUBCUTANEOUS
  Filled 2013-12-19 (×2): qty 0.4

## 2013-12-19 MED ORDER — ACETAMINOPHEN 325 MG PO TABS
650.0000 mg | ORAL_TABLET | Freq: Four times a day (QID) | ORAL | Status: DC | PRN
  Administered 2013-12-19: 650 mg via ORAL
  Filled 2013-12-19: qty 2

## 2013-12-19 NOTE — Progress Notes (Signed)
PROGRESS NOTE  Carol Bond VWU:[id number] DOB: [birth year] DOA: 12/17/2013 PCP: Namon Cirri  HPI 58 y.o. female with DM, HT, COPD, h/o PE/DVT, recently Dx with Lung CA presented with L sided pleuritic chest pain, associated with SOB, DOE and found to have PE   Assessment/Plan:  Acute left pulmonary artery thrombosis;  - after discussion with Dr. Julien Nordmann, given acute thrombosis in the setting of complete occlusion of left main pulmonary artery and presence of brain metastasis with hemorrhages, will stop therapeutic heparin and continue prolonged Lovenox but at prophylactic dosing.  Chest pain likely due to PE -will monitor ECG, trop; cardiology consulted.   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 She is to undergo XRT followed by chemotherapy.   COPD, cont inhalers, oxygen; no wheezing on exam   DM type 2- uncontrolled, hemoglobin A1c is 8.6. - Sliding scale  HTN stable; hold ARB due to IV contrast    Diet: carb  Fluids: None DVT Prophylaxis: Lovenox  Code Status: Full code Family Communication: d/w patient  Disposition Plan: Inpatient  Consultants:  Oncology  Cardiology  Radiation oncology  Procedures:  None   Antibiotics Levofloxacin << 4/23  HPI/Subjective: Patient feeling a little better this morning, denies shortness or breath  Objective: Filed Vitals:   12/18/13 2343 12/19/13 0000 12/19/13 0328 12/19/13 0400  BP: 116/69  126/75   Pulse: 85  74   Temp:  97.7 F (36.5 C)  97.8 F (36.6 C)  TempSrc:  Oral  Oral  Resp: 14  15   Height:      Weight:      SpO2: 97%  95%     Intake/Output Summary (Last 24 hours) at 12/19/13 0704 Last data filed at 12/19/13 0400  Gross per 24 hour  Intake   1578 ml  Output   3275 ml  Net  -1697 ml   Filed Weights   12/17/13 0611 12/17/13 1000  Weight: 81.647 kg (180 lb) 83.7 kg (184 lb 8.4 oz)   Exam:  General:   NAD  Cardiovascular: regular rate and rhythm, without MRG  Respiratory: good air movement, clear to auscultation throughout, no wheezing, ronchi or rales  Abdomen: soft, not tender to palpation, positive bowel sounds  MSK: no peripheral edema  Neuro: CN 2-12 grossly intact, MS 5/5 in all 4  Data Reviewed: Basic Metabolic Panel:  Recent Labs Lab 12/13/13 0641 12/17/13 0620 12/18/13 0315 12/19/13 0320  NA 140 141 139 140  K 4.5 4.0 4.9 4.3  CL 102 102 101 100  CO2 23 25 31 28   GLUCOSE 195* 127* 231* 268*  BUN 27* 19 18 21   CREATININE 0.65 0.53 0.81 0.78  CALCIUM 10.2 9.7 9.3 9.7   Liver Function Tests:  Recent Labs Lab 12/13/13 0641 12/19/13 0320  AST 25 24  ALT 42* 48*  ALKPHOS 56 54  BILITOT 0.2* <0.2*  PROT 7.4 7.0  ALBUMIN 4.1 3.6   CBC:  Recent Labs Lab 12/13/13 0641 12/17/13 0620 12/18/13 0315 12/19/13 0320  WBC 5.9 4.0 4.5 6.0  NEUTROABS  --  2.1  --   --   HGB 12.6 11.0* 10.6* 10.3*  HCT 39.7 33.6* 33.3* 32.4*  MCV 90.6 88.2 90.7 89.0  PLT 311 267 251 257   Cardiac Enzymes:  Recent Labs Lab 12/17/13 1030 12/17/13 1545 12/17/13 2040  TROPONINI <0.30 <0.30 <0.30   CBG:  Recent Labs Lab 12/17/13 2253 12/18/13 0815  12/18/13 1229 12/18/13 1600 12/18/13 2111  GLUCAP 174* 177* 194* 325* 286*    Recent Results (from the past 240 hour(s))  SURGICAL PCR SCREEN     Status: None   Collection Time    12/13/13  7:10 AM      Result Value Ref Range Status   MRSA, PCR NEGATIVE  NEGATIVE Final   Staphylococcus aureus NEGATIVE  NEGATIVE Final   Comment:            The Xpert SA Assay (FDA     approved for NASAL specimens     in patients over 22 years of age),     is one component of     a comprehensive surveillance     program.  Test performance has     been validated by [employer] for patients greater     than or equal to 50 year old.     It is not intended     to diagnose infection nor to     guide or monitor treatment.  MRSA  PCR SCREENING     Status: None   Collection Time    12/17/13 10:04 AM      Result Value Ref Range Status   MRSA by PCR NEGATIVE  NEGATIVE Final   Comment:            The GeneXpert MRSA Assay (FDA     approved for NASAL specimens     only), is one component of a     comprehensive MRSA colonization     surveillance program. It is not     intended to diagnose MRSA     infection nor to guide or     monitor treatment for     MRSA infections.     Studies: Ct Angio Chest Pe W/cm &/or Wo Cm  12/17/2013   CLINICAL DATA:  Chest pain and shortness of breath.  Lung cancer.  EXAM: CT ANGIOGRAPHY CHEST WITH CONTRAST  TECHNIQUE: Multidetector CT imaging of the chest was performed using the standard protocol during bolus administration of intravenous contrast. Multiplanar CT image reconstructions and MIPs were obtained to evaluate the vascular anatomy.  CONTRAST:  13mL OMNIPAQUE IOHEXOL 350 MG/ML SOLN  COMPARISON:  CT chest 12/09/2013.  FINDINGS: Re-demonstrated is a large mass centered at the left hilum and invading the adjacent mediastinum including AP window. Pericardial invasion is present. 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 Small left lower lobe branches noted previously no longer fill, likely due to thrombosis. No developing left lung infarction. Slight increase left pleural effusion could be malignant or reactive. Pericardial invasion. Slight decrease pericardial effusion. No acute PE on the right. Unchanged spiculated nodule left upper lobe. No right lung infiltrates. Stable mediastinal adenopathy.  Stable left adrenal mass. Hepatic steatosis. Normal aorta. No destructive osseous lesions.  Review of the MIP images confirms the above findings.  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 No developing left lung infarction. Slight increase left pleural  effusion. Pericardial invasion with Stable mediastinal adenopathy.   Electronically Signed   By: Rolla Flatten M.D.   On: 12/17/2013 08:14   Mr Jeri Cos OV Contrast  12/18/2013   CLINICAL DATA:  Small cell lung cancer, evaluate for brain metastasis.  EXAM: MRI HEAD WITHOUT AND WITH CONTRAST  TECHNIQUE: Multiplanar, multiecho pulse sequences of the  brain and surrounding structures were obtained without and with intravenous contrast.  CONTRAST:  36mL MULTIHANCE GADOBENATE DIMEGLUMINE 529 MG/ML IV SOLN  COMPARISON:  CT HEAD WO/W CM dated 05/19/2005  FINDINGS: At least 7 infratentorial mildly enhancing lesions with slight reduced diffusion consistent with hypercellularity consistent with metastasis, largest in the left cerebellum measuring 8 x 9 mm.  At least 19 supratentorial enhancing hypermetabolic metastasis seen, with surrounding bright FLAIR T2 hyperintense vasogenic edema, including a 8 x 9 mm lesion in right temporal lobe, tiny lesions in the right posterior frontal lobe, motor cortex. 3-4 mm enhancing lesions in the caudate bilaterally. No midline shift or mass effect. Tiny foci of susceptibility artifact associated with a few of the metastasis in the supratentorial brain. A few of the metastasis shows central hypo enhancement consistent with cystic necrosis. No reduced diffusion to suggest acute ischemia. The ventricles and sulci are overall normal for patient's age.  Additional patchy FLAIR T2 hyperintense signal in the pons, with scattered subcentimeter supratentorial T2 hyperintensity suggesting sequelae of chronic small vessel ischemic disease. Right occipital developmental venous anomaly, benign finding.  No abnormal extra-axial fluid collections. Though many of the intracranial metastasis are within the cortex/gray-white matter junction, no convincing evidence of dural or leptomeningeal metastatic disease. Normal major intracranial vascular flow voids seen at the skull base.  Ocular globes and orbital  contents are unremarkable. No paranasal sinus air-fluid levels. Mastoid air cells appear well-aerated. Of note, there is mild deformity of the left dorsal spinal cord due to apparent C1-2 arthropathy. No abnormal sellar expansion. No cerebellar tonsillar ectopia.  IMPRESSION: At least 7 infratentorial and 19 supratentorial subcentimeter enhancing intraparenchymal lesions consistent with metastatic disease, some of which are slightly hemorrhagic and/or cystic. Associated mild vasogenic edema without significant mass effect.  Mild-to-moderate additional white matter changes suggest sequelae of chronic small vessel ischemic disease, less likely nonenhancing metastatic disease.   Electronically Signed   By: Elon Alas   On: 12/18/2013 00:23    Scheduled Meds: . atorvastatin  40 mg Oral Daily  . dexamethasone  4 mg Oral 3 times per day  . escitalopram  10 mg Oral Daily  . fenofibrate  160 mg Oral Daily  . fluticasone  1 puff Inhalation BID  . insulin aspart  0-9 Units Subcutaneous TID WC  . levofloxacin  500 mg Oral Daily  . levothyroxine  150 mcg Oral QAC breakfast  . loratadine  10 mg Oral Daily  . pantoprazole  40 mg Oral Daily  . sodium chloride  3 mL Intravenous Q12H   Continuous Infusions: . heparin 1,500 Units/hr (12/19/13 0500)    Principal Problem:   Pulmonary embolus Active Problems:   Lung cancer   Hypothyroid   Diabetes   DOE (dyspnea on exertion)   PE (pulmonary embolism)   Abnormal finding on EKG - subtle inferior ST elevations   Brain metastases  Time spent: 25  This note has been created with Surveyor, quantity. Any transcriptional errors are unintentional.   Marzetta Board, MD [facility] Pager [phone]. If 7 PM - 7 AM, please contact night-coverage at [email or web address], password [facility] 12/19/2013, 7:04 AM  LOS: 2 days

## 2013-12-19 NOTE — Progress Notes (Signed)
ANTICOAGULATION CONSULT NOTE - Brief Note  Pharmacy Consult for Heparin  Indication: pulmonary embolus Assessement:  Heparin level = 0.81 on 1800 units/hr  Above goal (had been therapeutic x2 now increasing)  No bleeding/IV interuptions/drawn from opposite arm   Plan  Decrease heparin drip to 1500 units/hr  Recheck HL in 6 hrs  Daily CBC/HL  Dorrene German 12/19/2013 4:39 AM

## 2013-12-19 NOTE — Progress Notes (Signed)
Pt arrived from ICU to room 1442 in w/c. Amb to BR and bed with steady, indep gait. Tele 42 confirmed w/ CCMD. Pt oriented to callbell and environment. VSS. Mild SOB w/ exertion noted. Pt denies pain at present. No c/o.

## 2013-12-20 ENCOUNTER — Ambulatory Visit
Admit: 2013-12-20 | Discharge: 2013-12-20 | Disposition: A | Discharge status: Home / Self Care | Payer: 59 | Attending: Radiation Oncology | Admitting: Radiation Oncology

## 2013-12-20 ENCOUNTER — Ambulatory Visit: Payer: 59 | Admitting: Internal Medicine

## 2013-12-20 ENCOUNTER — Encounter: Payer: Self-pay | Admitting: *Deleted

## 2013-12-20 DIAGNOSIS — I2699 Other pulmonary embolism without acute cor pulmonale: Secondary | ICD-10-CM | POA: Diagnosis present

## 2013-12-20 LAB — CBC
HEMATOCRIT: 34.2 % — AB (ref 36.0–46.0)
Hemoglobin: 11.2 g/dL — ABNORMAL LOW (ref 12.0–15.0)
MCH: 28.7 pg (ref 26.0–34.0)
MCHC: 32.7 g/dL (ref 30.0–36.0)
MCV: 87.7 fL (ref 78.0–100.0)
Platelets: 319 10*3/uL (ref 150–400)
RBC: 3.9 MIL/uL (ref 3.87–5.11)
RDW: 13.9 % (ref 11.5–15.5)
WBC: 7.9 10*3/uL (ref 4.0–10.5)

## 2013-12-20 LAB — GLUCOSE, CAPILLARY
GLUCOSE-CAPILLARY: 281 mg/dL — AB (ref 70–99)
Glucose-Capillary: 379 mg/dL — ABNORMAL HIGH (ref 70–99)
Glucose-Capillary: 238 mg/dL — ABNORMAL HIGH (ref 70–99)

## 2013-12-20 MED ORDER — DEXAMETHASONE 4 MG PO TABS: 4.0000 mg | ORAL_TABLET | Freq: Three times a day (TID) | ORAL | Status: DC

## 2013-12-20 MED ORDER — ALBUTEROL SULFATE (2.5 MG/3ML) 0.083% IN NEBU: 2.5000 mg | INHALATION_SOLUTION | Freq: Two times a day (BID) | RESPIRATORY_TRACT | Status: DC

## 2013-12-20 MED ORDER — ALBUTEROL SULFATE HFA 108 (90 BASE) MCG/ACT IN AERS: 2.0000 | INHALATION_SPRAY | Freq: Four times a day (QID) | RESPIRATORY_TRACT | Status: AC | PRN

## 2013-12-20 MED ORDER — ENOXAPARIN SODIUM 40 MG/0.4ML ~~LOC~~ SOLN: 40.0000 mg | SUBCUTANEOUS | Status: DC

## 2013-12-20 MED ORDER — ENOXAPARIN (LOVENOX) PATIENT EDUCATION KIT
PACK | Freq: Once | Status: AC
  Administered 2013-12-20: 12:00:00
  Filled 2013-12-20: qty 1

## 2013-12-20 MED ORDER — INSULIN GLARGINE 100 UNITS/ML SOLOSTAR PEN: 5.0000 [IU] | PEN_INJECTOR | Freq: Every day | SUBCUTANEOUS | Status: DC

## 2013-12-20 MED ORDER — OXYCODONE HCL 10 MG PO TABS
10.0000 mg | ORAL_TABLET | Freq: Three times a day (TID) | ORAL | Status: DC | PRN
Start: 2013-12-20 — End: 2014-02-03

## 2013-12-20 NOTE — Discharge Summary (Signed)
Physician Discharge Summary  SMANTHA Bond BZJ:[id number] DOB: 1955-09-23 DOA: 12/17/2013  PCP: Namon Cirri  Admit date: 12/17/2013 Discharge date: 12/20/2013  Time spent: 35 minutes  Recommendations for Outpatient Follow-up:  1. Follow up with Dr. Julien Nordmann in 1 week 2. Follow up wth radiation oncology as scheduled   Discharge Diagnoses:  Principal Problem:   Pulmonary artery thrombosis Active Problems:   Lung cancer   Hypothyroid   Diabetes   DOE (dyspnea on exertion)   Abnormal finding on EKG - subtle inferior ST elevations   Brain metastases  Discharge Condition: stable  Diet recommendation: regular  Filed Weights   12/17/13 0611 12/17/13 1000  Weight: 81.647 kg (180 lb) 83.7 kg (184 lb 8.4 oz)   History of present illness:  Carol Bond is a 58 y.o. female with DM, HT, COPD, h/o PE/DVT, recently Dx with Lung CA presented with L sided pleuritic chest pain, associated with SOB, DOE and found to have PE; denies exertional chest pain, no dizziness, no syncope; has chronic cough, no fever; no nausea, vomiting or diarrhea   Hospital Course:  Acute left pulmonary artery thrombosis - after discussion with Dr. Julien Nordmann, given acute thrombosis rather than true pulmonary embolus, in the setting of complete occlusion of left main pulmonary artery and presence of brain metastasis with hemorrhages, will stop therapeutic heparin and continue prolonged Lovenox but at prophylactic dosing.  Chest pain - likely due to to her lung cancer as well as #1. Cardiology has been consulted we'll patient was hospitalized.  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 She has already started radiation therapy while hospitalized, and will complete 10 sessions as an outpatient. She was started on Decadron and would be discharged on 4 mg 3 times a day, with plans for taper in the future as per radiation oncology and medical  oncology. COPD, cont inhalers, oxygen; no wheezing on exam  DM type 2- uncontrolled, hemoglobin A1c is 8.6. Patient was started on Lantus given elevated fasting blood sugars. Advised to keep a CBG log and presented to her PCP. HTN stable  Procedures:  2D echo Study Conclusions - Left ventricle: The cavity size was normal. Systolic function was vigorous. The estimated ejection fraction was in the range of 65% to 70%. Wall motion was normal; there were no regional wall motion abnormalities. Doppler parameters are consistent with abnormal left ventricular relaxation (grade 1 diastolic dysfunction).   Consultations:  Cardiology  Oncology  Radiation oncology  Discharge Exam: Filed Vitals:   12/19/13 2147 12/20/13 0529 12/20/13 0800 12/20/13 1015  BP: 129/69 132/77  132/70  Pulse: 79 73  81  Temp: 97.4 F (36.3 C) 97.9 F (36.6 C)  97.6 F (36.4 C)  TempSrc: Oral Oral  Oral  Resp: 18 18  18   Height:      Weight:      SpO2: 98% 99% 98% 96%    General: No acute distress Cardiovascular: Regular rate and rhythm Respiratory: Clear to auscultation bilaterally  Discharge Instructions   Future Appointments Provider Department Dept Phone   12/23/2013 6:05 PM [facility] [phone]   12/24/2013 5:55 PM [facility] [phone]   12/25/2013 5:50 PM [facility] [phone]   12/26/2013 5:35 PM [facility] [phone]   12/27/2013 5:35 PM [facility] [phone]   12/30/2013 5:30 PM [facility] [phone]   12/31/2013 5:05 PM [facility] [phone]       Medication List         albuterol 108 (90 BASE)  MCG/ACT inhaler  Commonly known as:  PROVENTIL HFA;VENTOLIN HFA  Inhale 2 puffs into the lungs every 6 (six) hours as needed for wheezing or shortness of breath.     aspirin 81 MG tablet  Take 162 mg by mouth once.     atorvastatin 40 MG tablet  Commonly known as:  LIPITOR  Take 40 mg by mouth daily.     beclomethasone 80 MCG/ACT inhaler  Commonly known as:  QVAR  Inhale 2 puffs into the lungs at bedtime.     betamethasone dipropionate 0.05 % cream  Commonly known as:  DIPROLENE  Apply 1 application topically daily as needed.     chlorpheniramine-HYDROcodone 10-8 MG/5ML Lqcr  Commonly known as:  TUSSIONEX  Take 5 mLs by mouth every 12 (twelve) hours as needed for cough.     dexamethasone 4 MG tablet  Commonly known as:  DECADRON  Take 1 tablet (4 mg total) by mouth every 8 (eight) hours.     enoxaparin 40 MG/0.4ML injection  Commonly known as:  LOVENOX  Inject 0.4 mLs (40 mg total) into the skin daily.     escitalopram 10 MG tablet  Commonly known as:  LEXAPRO  Take 10 mg by mouth daily.     esomeprazole 40 MG capsule  Commonly known as:  NEXIUM  Take 40 mg by mouth daily at 12 noon.     fenofibrate 160 MG tablet  Take 160 mg by mouth daily.     fexofenadine 30 MG tablet  Commonly known as:  ALLEGRA  Take 30 mg by mouth 2 (two) times daily.     glimepiride 4 MG tablet  Commonly known as:  AMARYL  Take 4 mg by mouth daily with breakfast.     insulin glargine 100 unit/mL Sopn  Commonly known as:  LANTUS  Inject 0.05 mLs (5 Units total) into the skin at bedtime. Solostar Pen     INVOKANA 300 MG Tabs  Generic drug:  Canagliflozin  Take 1 tablet by mouth daily before breakfast.     levofloxacin 500 MG tablet  Commonly known as:  LEVAQUIN  Take 500 mg by mouth daily.     levothyroxine 150 MCG tablet  Commonly known as:  SYNTHROID, LEVOTHROID  Take 150 mcg by mouth daily before breakfast.     LOSARTAN POTASSIUM PO  Take 50 mg by mouth daily.     NASACORT  AQ 55 MCG/ACT Aero nasal inhaler  Generic drug:  triamcinolone  Place 1 spray into the nose daily.     Oxycodone HCl 10 MG Tabs  Take 1 tablet (10 mg total) by mouth every 8 (eight) hours as needed (Pain).     sitaGLIPtin-metformin 50-1000 MG per tablet  Commonly known as:  JANUMET  Take 1 tablet by mouth 2 (two) times daily with a meal.           Follow-up Information   Follow up with [name],[name], PA-C. Schedule an appointment as soon as possible for a visit in 2 weeks.   Specialty:  Physician Assistant   Contact information:   [address] [place] [address] [phone]  Follow up with Eilleen Kempf., MD. Schedule an appointment as soon as possible for a visit in 1 week.   Specialty:  Oncology   Contact information:   [address] [place] [address] [phone]       The results of significant diagnostics from this hospitalization (including imaging, microbiology, ancillary and laboratory) are listed below for reference.    Significant Diagnostic Studies: Dg Chest 2 View  12/13/2013   CLINICAL DATA:  Status post bronchoscopy  EXAM: CHEST  2 VIEW  COMPARISON:  12/13/2013  FINDINGS: Cardiac shadow is within normal limits. A new left-sided chest wall port is seen with the catheter tip in the mid to distal superior vena cava. No pneumothorax is noted. Persistent left hilar/ mediastinal mass lesion is seen. There is decreased inspiratory effort with atelectatic changes in the left lung likely related to obstructive change from the known mass lesion. The right lung is clear.  IMPRESSION: No pneumothorax following port placement.  Poor aeration of the left lung likely related to central compression and poor inspiratory effort.   Electronically Signed   By: Inez Catalina M.D.   On: 12/13/2013 11:28   Chest 2 View  12/13/2013   CLINICAL DATA:  Preoperative  EXAM: CHEST  2 VIEW  COMPARISON:  Prior CT from 12/09/2013  FINDINGS: Abnormal convexity  along the left mediastinal border overlying the region of the U aortic knob is compatible with known left hilar/mediastinal mass within this region. Mild cardiomegaly is stable.  Lungs are otherwise clear without focal infiltrate, pulmonary edema, or pleural effusion. No pneumothorax.  No acute osseous abnormality.  IMPRESSION: 1. Stable appearance of left hilar/ mediastinal mass as compared to recent CT from 12/09/2013. 2. No other acute cardiopulmonary abnormality identified.   Electronically Signed   By: Jeannine Boga M.D.   On: 12/13/2013 06:19   Ct Angio Chest Pe W/cm &/or Wo Cm  12/17/2013   CLINICAL DATA:  Chest pain and shortness of breath.  Lung cancer.  EXAM: CT ANGIOGRAPHY CHEST WITH CONTRAST  TECHNIQUE: Multidetector CT imaging of the chest was performed using the standard protocol during bolus administration of intravenous contrast. Multiplanar CT image reconstructions and MIPs were obtained to evaluate the vascular anatomy.  CONTRAST:  173mL OMNIPAQUE IOHEXOL 350 MG/ML SOLN  COMPARISON:  CT chest 12/09/2013.  FINDINGS: Re-demonstrated is a large mass centered at the left hilum and invading the adjacent mediastinum including AP window. Pericardial invasion is present. 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 Small left lower lobe branches noted previously no longer fill, likely due to thrombosis. No developing left lung infarction. Slight increase left pleural effusion could be malignant or reactive. Pericardial invasion. Slight decrease pericardial effusion. No acute PE on the right. Unchanged spiculated nodule left upper lobe. No right lung infiltrates. Stable mediastinal adenopathy.  Stable left adrenal mass. Hepatic steatosis. Normal aorta. No destructive osseous lesions.  Review of the MIP images confirms the above findings.  IMPRESSION: Interval progression with now complete thrombosis of the left pulmonary artery branches distal to the  left mainstem bronchus. No developing left lung infarction. Slight increase left pleural effusion. Pericardial invasion with Stable mediastinal adenopathy.   Electronically Signed   By: Rolla Flatten M.D.   On: 12/17/2013 08:14   Ct Angio Chest Pe W/cm &/or Wo Cm  12/09/2013   CLINICAL DATA:  Chest pain and severe cough. Pulmonary embolism in 1980. Ex-smoker.  BUN and creatinine  were obtained on site at [facility] at  [address]  Results:  BUN 14 mg/dL,  Creatinine 0.6 mg/dL.  EXAM: CT ANGIOGRAPHY CHEST WITH CONTRAST  TECHNIQUE: Multidetector CT imaging of the chest was performed using the standard protocol during bolus administration of intravenous contrast. Multiplanar CT image reconstructions and MIPs were obtained to evaluate the vascular anatomy.  CONTRAST:  147mL OMNIPAQUE IOHEXOL 350 MG/ML SOLN  COMPARISON:  Chest radiographs dated 02/03/2009 and chest CT dated 05/20/2007.  FINDINGS: Large mass centered at the left hilum and invading the adjacent mediastinum, including the AP window. This mass measures 8.9 x 5.6 cm on image number 44 of series 4 and 6.8 cm in length on image number 50 of series 600. This is compressing and causing almost complete occlusion of the distal left main pulmonary artery with lack of opacification of the majority of the left pulmonary artery branches.  There is also a new spiculated nodule in the left upper lobe measuring 1.2 cm in maximum diameter on image number 27 of series 5 which is also a new. There is also a new 4 mm subpleural nodule in the left upper lobe on image number 27 of series 5.  Also noted are multiple enlarged mediastinal lymph nodes. The majority of these are in the AP window, including a node with a short axis diameter of 13.5 mm on image number 45. A proximal right hilar node has a short axis diameter of 10 mm on image number 44.  There is an interval oval, mass-like area of in the pericardium on the left, measuring 2.8 x 1.0 cm on image  number 69. There is also a small pericardial effusion with a maximum thickness of 7 mm.  There are multiple areas of patchy density in the left upper lobe, lingula and left lower lobe. There is also an interval 3 mm subpleural nodule in the left lower lobe on image number 89, at the inferior aspect of somewhat nodular patchy opacity.  A small left pleural effusion is noted. On the last image, there is a suggestion of a partially imaged left adrenal mass, measuring 1.3 x 0.9 cm. The more inferior portion of the left adrenal gland is not included and the majority of the right adrenal gland is not included.  The remainder of the lungs are mildly hyperexpanded with mildly prominent interstitial markings. Diffuse peribronchial thickening is also noted.  The pulmonary arteries on the right are normally opacified with no pulmonary emboli seen.  Mild diffuse low density of the liver is noted in the upper abdomen. Thoracic spine degenerative changes are noted.  Review of the MIP images confirms the above findings.  IMPRESSION: 1. 8.9 x 6.8 x 5.6 cm left hilar and mediastinal mass causing almost complete occlusion of the distal left main pulmonary artery with lack of opacification of the majority of the pulmonary arteries on the left. 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 2. Mediastinal and right hilar metastatic adenopathy. 3. Possible left adrenal metastasis. 4. 1.2 cm spiculated left upper lobe nodule, concerning for a primary lung carcinoma. 5. 4 mm left upper lobe subpleural nodules suspicious for a metastasis. 6. Possible left pericardial metastasis measuring 2.8 x 1.0 cm. 7. Patchy opacities most likely representing areas of pulmonary infarction in the left upper lobe, lingula and left lower lobe. An area in the left lower lobe has nodular components. 8. Small left pleural effusion. 9. Small pericardial effusion. 10. COPD.  11. Mild diffuse hepatic steatosis.  These  results were called by telephone at the time of interpretation on 12/09/2013 at 12:56 PM to Dr. Antony Contras , who verbally acknowledged these results.   Electronically Signed   By: Enrique Sack M.D.   On: 12/09/2013 12:30   Mr Jeri Cos FA Contrast  12/18/2013   CLINICAL DATA:  Small cell lung cancer, evaluate for brain metastasis.  EXAM: MRI HEAD WITHOUT AND WITH CONTRAST  TECHNIQUE: Multiplanar, multiecho pulse sequences of the brain and surrounding structures were obtained without and with intravenous contrast.  CONTRAST:  35mL MULTIHANCE GADOBENATE DIMEGLUMINE 529 MG/ML IV SOLN  COMPARISON:  CT HEAD WO/W CM dated 05/19/2005  FINDINGS: At least 7 infratentorial mildly enhancing lesions with slight reduced diffusion consistent with hypercellularity consistent with metastasis, largest in the left cerebellum measuring 8 x 9 mm.  At least 19 supratentorial enhancing hypermetabolic metastasis seen, with surrounding bright FLAIR T2 hyperintense vasogenic edema, including a 8 x 9 mm lesion in right temporal lobe, tiny lesions in the right posterior frontal lobe, motor cortex. 3-4 mm enhancing lesions in the caudate bilaterally. No midline shift or mass effect. Tiny foci of susceptibility artifact associated with a few of the metastasis in the supratentorial brain. A few of the metastasis shows central hypo enhancement consistent with cystic necrosis. No reduced diffusion to suggest acute ischemia. The ventricles and sulci are overall normal for patient's age.  Additional patchy FLAIR T2 hyperintense signal in the pons, with scattered subcentimeter supratentorial T2 hyperintensity suggesting sequelae of chronic small vessel ischemic disease. Right occipital developmental venous anomaly, benign finding.  No abnormal extra-axial fluid collections. Though many of the intracranial metastasis are within the cortex/gray-white matter junction, no convincing evidence of dural or leptomeningeal metastatic disease. Normal major  intracranial vascular flow voids seen at the skull base.  Ocular globes and orbital contents are unremarkable. No paranasal sinus air-fluid levels. Mastoid air cells appear well-aerated. Of note, there is mild deformity of the left dorsal spinal cord due to apparent C1-2 arthropathy. No abnormal sellar expansion. No cerebellar tonsillar ectopia.  IMPRESSION: At least 7 infratentorial and 19 supratentorial subcentimeter enhancing intraparenchymal lesions consistent with metastatic disease, some of which are slightly hemorrhagic and/or cystic. Associated mild vasogenic edema without significant mass effect.  Mild-to-moderate additional white matter changes suggest sequelae of chronic small vessel ischemic disease, less likely nonenhancing metastatic disease.   Electronically Signed   By: Elon Alas   On: 12/18/2013 00:23   Dg Fluoro Guide Cv Line-no Report  12/13/2013   CLINICAL DATA: inserting port   FLOURO GUIDE CV LINE  Fluoroscopy was utilized by the requesting physician.  No radiographic  interpretation.    Mm Screening Breast Tomo Bilateral  12/05/2013   CLINICAL DATA:  Screening.  EXAM: DIGITAL SCREENING BILATERAL MAMMOGRAM WITH 3D TOMO WITH CAD  COMPARISON:  Previous exam(s).  ACR Breast Density Category b: There are scattered areas of fibroglandular density.  FINDINGS: There are no findings suspicious for malignancy. Images were processed with CAD.  IMPRESSION: No mammographic evidence of malignancy. A result letter of this screening mammogram will be mailed directly to the patient.  RECOMMENDATION: Screening mammogram in one year. (Code:[id number]-B-01Y)  BI-RADS CATEGORY  1: Negative.   Electronically Signed   By: Lovey Newcomer M.D.   On: 12/05/2013 11:00    Microbiology: Recent Results (from the past 240 hour(s))  SURGICAL PCR SCREEN     Status: None   Collection Time    12/13/13  7:10 AM      Result Value Ref Range Status   MRSA, PCR NEGATIVE  NEGATIVE Final   Staphylococcus aureus NEGATIVE   NEGATIVE Final   Comment:            The Xpert SA Assay (FDA     approved for NASAL specimens     in patients over 51 years of age),     is one component of     a comprehensive surveillance     program.  Test performance has     been validated by [employer] for patients greater     than or equal to 48 year old.     It is not intended     to diagnose infection nor to     guide or monitor treatment.  MRSA PCR SCREENING     Status: None   Collection Time    12/17/13 10:04 AM      Result Value Ref Range Status   MRSA by PCR NEGATIVE  NEGATIVE Final   Comment:            The GeneXpert MRSA Assay (FDA     approved for NASAL specimens     only), is one component of a     comprehensive MRSA colonization     surveillance program. It is not     intended to diagnose MRSA     infection nor to guide or     monitor treatment for     MRSA infections.     Labs: Basic Metabolic Panel:  Recent Labs Lab 12/17/13 0620 12/18/13 0315 12/19/13 0320  NA 141 139 140  K 4.0 4.9 4.3  CL 102 101 100  CO2 25 31 28   GLUCOSE 127* 231* 268*  BUN 19 18 21   CREATININE 0.53 0.81 0.78  CALCIUM 9.7 9.3 9.7   Liver Function Tests:  Recent Labs Lab 12/19/13 0320  AST 24  ALT 48*  ALKPHOS 54  BILITOT <0.2*  PROT 7.0  ALBUMIN 3.6   No results found for this basename: LIPASE, AMYLASE,  in the last 168 hours No results found for this basename: AMMONIA,  in the last 168 hours CBC:  Recent Labs Lab 12/17/13 0620 12/18/13 0315 12/19/13 0320 12/20/13 0357  WBC 4.0 4.5 6.0 7.9  NEUTROABS 2.1  --   --   --   HGB 11.0* 10.6* 10.3* 11.2*  HCT 33.6* 33.3* 32.4* 34.2*  MCV 88.2 90.7 89.0 87.7  PLT 267 251 257 319   Cardiac Enzymes:  Recent Labs Lab 12/17/13 1030 12/17/13 1545 12/17/13 2040  TROPONINI <0.30 <0.30 <0.30   CBG:  Recent Labs Lab 12/19/13 1153 12/19/13 1808 12/19/13 2145 12/20/13 0730 12/20/13 1135  GLUCAP 300* 345* 379* 238* 281*    Signed:  Rashid Whitenight M  Xee Hollman  [facility] 12/20/2013, 3:39 PM

## 2013-12-20 NOTE — Progress Notes (Signed)
Received FMLA paper work for pt husband.  Gave to [place].

## 2013-12-20 NOTE — Progress Notes (Signed)
Inpatient Diabetes Program Recommendations  AACE/ADA: New Consensus Statement on Inpatient Glycemic Control (2013)  Target Ranges:  Prepandial:   less than 140 mg/dL      Peak postprandial:   less than 180 mg/dL (1-2 hours)      Critically ill patients:  140 - 180 mg/dL   Reason for Visit: Hyperglycemia  Diabetes history: DM2 Outpatient Diabetes medications: Invokana 300 mg QD, Amaryl 4 mg QD and Janumet 50/1000 mg bid Current orders for Inpatient glycemic control: Novolog moderate tidwc On Decadron 4 mg Q8H  Results for Carol Bond, Carol Bond (MRN [id number]) as of 12/20/2013 10:06  Ref. Range 12/19/2013 08:38 12/19/2013 11:53 12/19/2013 18:08 12/19/2013 21:45 12/20/2013 07:30  Glucose-Capillary Latest Range: 70-99 mg/dL 265 (H) 300 (H) 345 (H) 379 (H) 238 (H)    Inpatient Diabetes Program Recommendations Insulin - Basal: Please consider addition of basal insulin - Lantus 10 units QHS Correction (SSI): Increase Novolog to moderate tidwc and hs  Note: Will continue to follow. Thank you. Lorenda Peck, RD, LDN, CDE Inpatient Diabetes Coordinator [phone]

## 2013-12-20 NOTE — Discharge Instructions (Signed)
Accuchecks 4 times/day, Once in AM empty stomach and then before each meal. Log in all results and show them to your Prim.MD in 3 days. If any glucose reading is under 80 or above 300 call your Prim MD immediately. Follow Low glucose instructions for glucose under 80 as instructed.  You were cared for by a hospitalist during your hospital stay. If you have any questions about your discharge medications or the care you received while you were in the hospital after you are discharged, you can call the unit and asked to speak with the hospitalist on call if the hospitalist that took care of you is not available. Once you are discharged, your primary care physician will handle any further medical issues. Please note that NO REFILLS for any discharge medications will be authorized once you are discharged, as it is imperative that you return to your primary care physician (or establish a relationship with a primary care physician if you do not have one) for your aftercare needs so that they can reassess your need for medications and monitor your lab values.     If you do not have a primary care physician, you can call [phone] for a physician referral.  Follow with Primary MD [name],[name], PA-C in 2 weeks Follow up with Radiation oncology as scheduled Follow up with Dr. Julien Nordmann in 1 week   Get CBC, CMP checked by your doctor and again as further instructed.  Get a 2 view Chest X ray done next visit if you had Pneumonia of Lung problems at the [facility] reviewed and adjusted.  Please request your Prim.MD to go over all Hospital Tests and Procedure/Radiological results at the follow up, please get all Hospital records sent to your Prim MD by signing hospital release before you go home.  Activity: As tolerated with Full fall precautions use walker/cane & assistance as needed  Diet: diabetic  For Heart failure patients - Check your Weight same time everyday, if you gain over 2 pounds,  or you develop in leg swelling, experience more shortness of breath or chest pain, call your Primary MD immediately. Follow Cardiac Low Salt Diet and 1.8 lit/day fluid restriction.  Disposition Home  If you experience worsening of your admission symptoms, develop shortness of breath, life threatening emergency, suicidal or homicidal thoughts you must seek medical attention immediately by calling 911 or calling your MD immediately  if symptoms less severe.  You Must read complete instructions/literature along with all the possible adverse reactions/side effects for all the Medicines you take and that have been prescribed to you. Take any new Medicines after you have completely understood and accpet all the possible adverse reactions/side effects.   Do not drive and provide baby sitting services if your were admitted for syncope or siezures until you have seen by Primary MD or a Neurologist and advised to do so again.  Do not drive when taking Pain medications.   Do not take more than prescribed Pain, Sleep and Anxiety Medications  Special Instructions: If you have smoked or chewed Tobacco  in the last 2 yrs please stop smoking, stop any regular Alcohol  and or any Recreational drug use.  Wear Seat belts while driving.

## 2013-12-20 NOTE — Progress Notes (Signed)
Patient was sitting up in bed with her husband at the bedside.  She tolerated her morning meds well.  Patient did not have oxygen on and stated that the morning nebulizer treatment by Respiratory was making her feel better.  She went down for radiation around 1000.  Patient said she felt good and was hoping to be able to go home soon.  Bed was in lowest position and bedside table and call light were within reach.

## 2013-12-20 NOTE — Progress Notes (Signed)
I have reviewed all documentation from student nurse.

## 2013-12-20 NOTE — Progress Notes (Signed)
D/C instructions reviewed w/ pt and husband. Both verbalize understanding and all questions answered. Pt has experience w/ self injection of heparin and verbalizes comfort w/ lovenox self injection and lantus injection. States she also has a daughter who is an Therapist, sports if she should require any assistance. Lovenox teaching reviewed w/ pt and husband as well as lantus injection and s/s of hypo/hyperglycemia. Pt d/c in w/c in stable condition to family's car by NT. Pt in possession of d/c instructions, scripts, and all personal belongings.

## 2013-12-23 ENCOUNTER — Telehealth: Payer: Self-pay | Admitting: Medical Oncology

## 2013-12-23 ENCOUNTER — Ambulatory Visit
Admission: RE | Admit: 2013-12-23 | Discharge: 2013-12-23 | Disposition: A | Discharge status: Home / Self Care | Payer: 59 | Source: Ambulatory Visit | Attending: Radiation Oncology | Admitting: Radiation Oncology

## 2013-12-23 ENCOUNTER — Ambulatory Visit (HOSPITAL_COMMUNITY): Admission: RE | Admit: 2013-12-23 | Payer: 59 | Source: Ambulatory Visit

## 2013-12-23 NOTE — Telephone Encounter (Signed)
Wants note to return to work. I told her to speak with him about it Thursday.

## 2013-12-24 ENCOUNTER — Ambulatory Visit
Admission: RE | Admit: 2013-12-24 | Discharge: 2013-12-24 | Disposition: A | Discharge status: Home / Self Care | Payer: 59 | Source: Ambulatory Visit | Attending: Radiation Oncology | Admitting: Radiation Oncology

## 2013-12-24 ENCOUNTER — Telehealth: Payer: Self-pay | Admitting: Oncology

## 2013-12-24 ENCOUNTER — Encounter: Payer: Self-pay | Admitting: Internal Medicine

## 2013-12-24 ENCOUNTER — Ambulatory Visit: Payer: 59 | Admitting: Radiation Oncology

## 2013-12-24 VITALS — BP 116/70 | HR 77 | Temp 98.4°F | Ht 66.0 in | Wt 177.0 lb

## 2013-12-24 DIAGNOSIS — C3490 Malignant neoplasm of unspecified part of unspecified bronchus or lung: Secondary | ICD-10-CM

## 2013-12-24 MED ORDER — BIAFINE EX EMUL
Freq: Two times a day (BID) | CUTANEOUS | Status: DC
  Administered 2013-12-24: 15:00:00 via TOPICAL

## 2013-12-24 NOTE — Progress Notes (Addendum)
Carol Bond has had 5 fractions to her whole brain and left chest.  She is having pain in her left upper chest/shoulder area that she is rating at a 1/10.  She reports that her shortness of breath has improved.  Her oxygen saturation today is 98% on room air.  She denies having a cough and denies hemoptysis.  She reports dizziness when she first lays down.  She has been having occasional headaches after radiation.  She denies vision changes, balance issues.  She reports a very good appetite.  She has lost 7 lbs since 4/21 but says she is eating good.  She is taking decadron 4 mg every 8 hours.  She is wondering if she should be taking 2, 81 mg of aspirin a day and Lovenox.  She is also wondering if it is OK to drive.  Patient was given the Radiation Therapy and You book and discussed potential side effects/management of fatigue, skin changes, throat changes and nausea.  She was given biafine cream and was instructed to apply it to her left chest and forehead twice a day after treatment and at bedtime.  She was educated about under treat day with Dr. Sondra Come on Tuesday's.  She was given my business card and was instructed to call with any questions or concerns.

## 2013-12-24 NOTE — Progress Notes (Signed)
  Radiation Oncology         [phone] ________________________________  Name: Carol Bond MRN: [id number]  Date: 12/24/2013  DOB: Sep 26, 1955  Weekly Radiation Therapy Management  Lung cancer   Primary site: Lung (Left)   Staging method: AJCC 7th Edition   Clinical free text: Extensive stage small cell lung cancer   Clinical: Stage IV (T3, N2, M1b) signed by Curt Bears, MD on 12/17/2013  7:45 PM   Summary: Stage IV (T3, N2, M1b)  Current Dose: 15 Gy     Planned Dose:  30 Gy directed at the left lung and whole brain  Narrative . . . . . . . . The patient presents for routine under treatment assessment.                                   The patient  does have some mild headaches but does not report any visual problems. She denies any swallowing problems. Her breathing has improved. She was discharged from the hospital late last week.                                 Set-up films were reviewed.                                 The chart was checked. Physical Findings. . .  height is 5\' 6"  (1.676 m) and weight is 177 lb (80.287 kg). Her temperature is 98.4 F (36.9 C). Her blood pressure is 116/70 and her pulse is 77. . Weight essentially stable.  No significant changes. Impression . . . . . . . The patient is tolerating radiation. Cone beam CT scanning of the chest shows good shrinkage of the left perihilar mass. Plan . . . . . . . . . . . . Continue treatment as planned.   ________________________________   Blair Promise, PhD, MD

## 2013-12-24 NOTE — Telephone Encounter (Signed)
Mendel Ryder, Ireene's daughter, called and asked if it is OK for Carol Bond to drive due to her brain mets.  Advised her that she should not drive unless released by her doctor.  Also advised her that Dr. Sondra Come will be seeing Carol Bond today for an under treat visit.  Mendel Ryder verbalized agreement and is going to drive her mother to radiation today.

## 2013-12-24 NOTE — Progress Notes (Signed)
Faxed husband's fmla form to [employer] @ [phone]

## 2013-12-25 ENCOUNTER — Ambulatory Visit
Admission: RE | Admit: 2013-12-25 | Discharge: 2013-12-25 | Disposition: A | Discharge status: Home / Self Care | Payer: 59 | Source: Ambulatory Visit | Attending: Radiation Oncology | Admitting: Radiation Oncology

## 2013-12-26 ENCOUNTER — Ambulatory Visit (HOSPITAL_BASED_OUTPATIENT_CLINIC_OR_DEPARTMENT_OTHER): Payer: 59 | Admitting: Physician Assistant

## 2013-12-26 ENCOUNTER — Encounter: Payer: Self-pay | Admitting: Physician Assistant

## 2013-12-26 ENCOUNTER — Ambulatory Visit
Admission: RE | Admit: 2013-12-26 | Discharge: 2013-12-26 | Disposition: A | Discharge status: Home / Self Care | Payer: 59 | Source: Ambulatory Visit | Attending: Radiation Oncology | Admitting: Radiation Oncology

## 2013-12-26 ENCOUNTER — Telehealth: Payer: Self-pay | Admitting: *Deleted

## 2013-12-26 ENCOUNTER — Telehealth: Payer: Self-pay | Admitting: Internal Medicine

## 2013-12-26 VITALS — BP 124/69 | HR 90 | Temp 98.3°F | Resp 20 | Ht 66.0 in | Wt 176.4 lb

## 2013-12-26 DIAGNOSIS — C3490 Malignant neoplasm of unspecified part of unspecified bronchus or lung: Secondary | ICD-10-CM

## 2013-12-26 DIAGNOSIS — C7931 Secondary malignant neoplasm of brain: Secondary | ICD-10-CM

## 2013-12-26 DIAGNOSIS — C7949 Secondary malignant neoplasm of other parts of nervous system: Secondary | ICD-10-CM

## 2013-12-26 DIAGNOSIS — C3410 Malignant neoplasm of upper lobe, unspecified bronchus or lung: Secondary | ICD-10-CM

## 2013-12-26 MED ORDER — PROCHLORPERAZINE MALEATE 10 MG PO TABS: 10.0000 mg | ORAL_TABLET | Freq: Four times a day (QID) | ORAL | Status: DC | PRN

## 2013-12-26 NOTE — Patient Instructions (Signed)
Complete her whole brain radiation as scheduled Start her first cycle of chemotherapy as scheduled on 01/01/2014 Followup in 2 weeks for a symptom management visit

## 2013-12-26 NOTE — Progress Notes (Addendum)
No images are attached to the encounter. No scans are attached to the encounter. No scans are attached to the encounter. [facility] VISIT PROGRESS NOTE  [address], [address] [address] [place] [address]  DIAGNOSIS: Lung cancer   Primary site: Lung (Left)   Staging method: AJCC 7th Edition   Clinical free text: Extensive stage small cell lung cancer   Clinical: Stage IV (T3, N2, M1b) signed by Curt Bears, MD on 12/17/2013  7:45 PM   Summary: Stage IV (T3, N2, M1b)  PRIOR THERAPY: None  CURRENT THERAPY:  1. Whole brain irradiation under the care of Dr. Sondra Come 2. Systemic chemotherapy with carboplatin for an AUC of 5 given on day 1, etoposide at 120 mg per meter square given on days 1, 2 and 3 with Neulasta support given on day 4. Systemic chemotherapy on hold until patient completes whole brain irradiation. Cycle #1 expected to be given starting on 01/01/2014  DISEASE STAGE: Lung cancer   Primary site: Lung (Left)   Staging method: AJCC 7th Edition   Clinical free text: Extensive stage small cell lung cancer   Clinical: Stage IV (T3, N2, M1b) signed by Curt Bears, MD on 12/17/2013  7:45 PM   Summary: Stage IV (T3, N2, M1b)  CHEMOTHERAPY INTENT: Palliative  CURRENT # OF CHEMOTHERAPY CYCLES: 0  CURRENT ANTIEMETICS: none  CURRENT SMOKING STATUS: Former smoker, quit July 2014  ORAL CHEMOTHERAPY AND CONSENT: n/a  CURRENT BISPHOSPHONATES USE: none  PAIN MANAGEMENT: oxycodone  NARCOTICS INDUCED CONSTIPATION:  none  LIVING WILL AND CODE STATUS:    INTERVAL HISTORY: Carol Bond 58 y.o. female returns for a scheduled regular office visit for followup of her recently diagnosed extensive stage small cell lung cancer. She is currently undergoing whole brain irradiation under the care of Dr. Sondra Come. She is expected to complete her whole brain radiation on 12/31/2013. Overall she is tolerating this treatment relatively well. She  reports that her appetite is good no lid food is not taste good. She is scheduled for PET scan on 01/02/2014 to complete her disease staging. She continues to have some left shoulder pain but reports that she is breathing easier. She usually has a little mild headache after her radiation treatments but voices no other complaints. She is wanting to work if at all possible. I would recommend that she remain on her short term disability at least through this first cycle to see how she tolerates the chemotherapy and side effects. She denied any fever, chills, nausea, vomiting, diarrhea or constipation. She voiced no other specific complaints other than those listed above.  MEDICAL HISTORY: Past Medical History  Diagnosis Date  . Pulmonary embolism   . Hypothyroid   . Diabetes   . Cancer     Lung    ALLERGIES:  is allergic to actos and vicodin.  MEDICATIONS:  Current Outpatient Prescriptions  Medication Sig Dispense Refill  . albuterol (PROVENTIL HFA;VENTOLIN HFA) 108 (90 BASE) MCG/ACT inhaler Inhale 2 puffs into the lungs every 6 (six) hours as needed for wheezing or shortness of breath.  1 Inhaler  2  . aspirin 81 MG tablet Take 162 mg by mouth once.      Marland Kitchen atorvastatin (LIPITOR) 40 MG tablet Take 40 mg by mouth daily.      . beclomethasone (QVAR) 80 MCG/ACT inhaler Inhale 2 puffs into the lungs at bedtime.      . betamethasone dipropionate (DIPROLENE) 0.05 % cream Apply 1 application  topically daily as needed.      . Canagliflozin (INVOKANA) 300 MG TABS Take 1 tablet by mouth daily before breakfast.      . dexamethasone (DECADRON) 4 MG tablet Take 1 tablet (4 mg total) by mouth every 8 (eight) hours.  60 tablet  0  . enoxaparin (LOVENOX) 40 MG/0.4ML injection Inject 0.4 mLs (40 mg total) into the skin daily.  30 Syringe  1  . escitalopram (LEXAPRO) 10 MG tablet Take 10 mg by mouth daily.      Marland Kitchen esomeprazole (NEXIUM) 40 MG capsule Take 40 mg by mouth daily at 12 noon.      . fenofibrate 160  MG tablet Take 160 mg by mouth daily.      . fexofenadine (ALLEGRA) 30 MG tablet Take 30 mg by mouth 2 (two) times daily.      Marland Kitchen glimepiride (AMARYL) 4 MG tablet Take 4 mg by mouth daily with breakfast.      . insulin glargine (LANTUS) 100 unit/mL SOPN Inject 0.05 mLs (5 Units total) into the skin at bedtime. Solostar Pen  15 mL  11  . levothyroxine (SYNTHROID, LEVOTHROID) 150 MCG tablet Take 150 mcg by mouth daily before breakfast.      . LOSARTAN POTASSIUM PO Take 50 mg by mouth daily.      . Oxycodone HCl 10 MG TABS Take 1 tablet (10 mg total) by mouth every 8 (eight) hours as needed (Pain).  60 tablet  0  . sitaGLIPtin-metformin (JANUMET) 50-1000 MG per tablet Take 1 tablet by mouth 2 (two) times daily with a meal.      . chlorpheniramine-HYDROcodone (TUSSIONEX) 10-8 MG/5ML LQCR Take 5 mLs by mouth every 12 (twelve) hours as needed for cough.      . emollient (BIAFINE) cream Apply topically 2 (two) times daily.      . prochlorperazine (COMPAZINE) 10 MG tablet Take 1 tablet (10 mg total) by mouth every 6 (six) hours as needed for nausea or vomiting.  30 tablet  2  . triamcinolone (NASACORT AQ) 55 MCG/ACT AERO nasal inhaler Place 1 spray into the nose daily.       No current facility-administered medications for this visit.    SURGICAL HISTORY:  Past Surgical History  Procedure Laterality Date  . Abdominal hysterectomy    . Spine surgery    . Cervical fusion    . Video bronchoscopy with endobronchial ultrasound N/A 12/13/2013    Procedure: VIDEO BRONCHOSCOPY WITH ENDOBRONCHIAL ULTRASOUND;  Surgeon: Grace Isaac, MD;  Location: [facility];  Service: Thoracic;  Laterality: N/A;  . Portacath placement Left 12/13/2013    Procedure: INSERTION PORT-A-CATH;  Surgeon: Grace Isaac, MD;  Location: [facility];  Service: Thoracic;  Laterality: Left;    REVIEW OF SYSTEMS:  Constitutional: positive for fatigue Eyes: negative Ears, nose, mouth, throat, and face: negative Respiratory: positive for  dyspnea on exertion Cardiovascular: negative Gastrointestinal: negative Genitourinary:negative Integument/breast: negative Hematologic/lymphatic: negative Musculoskeletal:negative Neurological: negative Behavioral/Psych: negative Endocrine: negative Allergic/Immunologic: negative   PHYSICAL EXAMINATION: General appearance: alert, cooperative, appears stated age and no distress Head: Normocephalic, without obvious abnormality, atraumatic Neck: no adenopathy, no carotid bruit, no JVD, supple, symmetrical, trachea midline and thyroid not enlarged, symmetric, no tenderness/mass/nodules Lymph nodes: Cervical, supraclavicular, and axillary nodes normal. Resp: clear to auscultation bilaterally Back: symmetric, no curvature. ROM normal. No CVA tenderness. Cardio: regular rate and rhythm, S1, S2 normal, no murmur, click, rub or gallop GI: soft, non-tender; bowel sounds normal; no masses,  no  organomegaly Extremities: extremities normal, atraumatic, no cyanosis or edema Neurologic: Alert and oriented X 3, normal strength and tone. Normal symmetric reflexes. Normal coordination and gait  ECOG PERFORMANCE STATUS: 1 - Symptomatic but completely ambulatory  Blood pressure 124/69, pulse 90, temperature 98.3 F (36.8 C), temperature source Oral, resp. rate 20, height 5\' 6"  (1.676 m), weight 176 lb 6.4 oz (80.015 kg), SpO2 98.00%.  LABORATORY DATA: Lab Results  Component Value Date   WBC 7.9 12/20/2013   HGB 11.2* 12/20/2013   HCT 34.2* 12/20/2013   MCV 87.7 12/20/2013   PLT 319 12/20/2013      Chemistry      Component Value Date/Time   NA 140 12/19/2013 0320   K 4.3 12/19/2013 0320   CL 100 12/19/2013 0320   CO2 28 12/19/2013 0320   BUN 21 12/19/2013 0320   CREATININE 0.78 12/19/2013 0320      Component Value Date/Time   CALCIUM 9.7 12/19/2013 0320   ALKPHOS 54 12/19/2013 0320   AST 24 12/19/2013 0320   ALT 48* 12/19/2013 0320   BILITOT <0.2* 12/19/2013 0320       RADIOGRAPHIC  STUDIES:  Dg Chest 2 View  12/13/2013   CLINICAL DATA:  Status post bronchoscopy  EXAM: CHEST  2 VIEW  COMPARISON:  12/13/2013  FINDINGS: Cardiac shadow is within normal limits. A new left-sided chest wall port is seen with the catheter tip in the mid to distal superior vena cava. No pneumothorax is noted. Persistent left hilar/ mediastinal mass lesion is seen. There is decreased inspiratory effort with atelectatic changes in the left lung likely related to obstructive change from the known mass lesion. The right lung is clear.  IMPRESSION: No pneumothorax following port placement.  Poor aeration of the left lung likely related to central compression and poor inspiratory effort.   Electronically Signed   By: Inez Catalina M.D.   On: 12/13/2013 11:28   Chest 2 View  12/13/2013   CLINICAL DATA:  Preoperative  EXAM: CHEST  2 VIEW  COMPARISON:  Prior CT from 12/09/2013  FINDINGS: Abnormal convexity along the left mediastinal border overlying the region of the U aortic knob is compatible with known left hilar/mediastinal mass within this region. Mild cardiomegaly is stable.  Lungs are otherwise clear without focal infiltrate, pulmonary edema, or pleural effusion. No pneumothorax.  No acute osseous abnormality.  IMPRESSION: 1. Stable appearance of left hilar/ mediastinal mass as compared to recent CT from 12/09/2013. 2. No other acute cardiopulmonary abnormality identified.   Electronically Signed   By: Jeannine Boga M.D.   On: 12/13/2013 06:19   Ct Angio Chest Pe W/cm &/or Wo Cm  12/17/2013   CLINICAL DATA:  Chest pain and shortness of breath.  Lung cancer.  EXAM: CT ANGIOGRAPHY CHEST WITH CONTRAST  TECHNIQUE: Multidetector CT imaging of the chest was performed using the standard protocol during bolus administration of intravenous contrast. Multiplanar CT image reconstructions and MIPs were obtained to evaluate the vascular anatomy.  CONTRAST:  152mL OMNIPAQUE IOHEXOL 350 MG/ML SOLN  COMPARISON:  CT chest  12/09/2013.  FINDINGS: Re-demonstrated is a large mass centered at the left hilum and invading the adjacent mediastinum including AP window. Pericardial invasion is present. 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 Small left lower lobe branches noted previously no longer fill, likely due to thrombosis. No developing left lung infarction. Slight increase left pleural effusion could be malignant or reactive. Pericardial invasion.  Slight decrease pericardial effusion. No acute PE on the right. Unchanged spiculated nodule left upper lobe. No right lung infiltrates. Stable mediastinal adenopathy.  Stable left adrenal mass. Hepatic steatosis. Normal aorta. No destructive osseous lesions.  Review of the MIP images confirms the above findings.  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 No developing left lung infarction. Slight increase left pleural effusion. Pericardial invasion with Stable mediastinal adenopathy.   Electronically Signed   By: Rolla Flatten M.D.   On: 12/17/2013 08:14   Ct Angio Chest Pe W/cm &/or Wo Cm  12/09/2013   CLINICAL DATA:  Chest pain and severe cough. Pulmonary embolism in 1980. Ex-smoker.  BUN and creatinine were obtained on site at [facility] at  [address]  Results:  BUN 14 mg/dL,  Creatinine 0.6 mg/dL.  EXAM: CT ANGIOGRAPHY CHEST WITH CONTRAST  TECHNIQUE: Multidetector CT imaging of the chest was performed using the standard protocol during bolus administration of intravenous contrast. Multiplanar CT image reconstructions and MIPs were obtained to evaluate the vascular anatomy.  CONTRAST:  133mL OMNIPAQUE IOHEXOL 350 MG/ML SOLN  COMPARISON:  Chest radiographs dated 02/03/2009 and chest CT dated 05/20/2007.  FINDINGS: Large mass centered at the left hilum and invading the adjacent mediastinum, including the AP window. This mass  measures 8.9 x 5.6 cm on image number 44 of series 4 and 6.8 cm in length on image number 50 of series 600. This is compressing and causing almost complete occlusion of the distal left main pulmonary artery with lack of opacification of the majority of the left pulmonary artery branches.  There is also a new spiculated nodule in the left upper lobe measuring 1.2 cm in maximum diameter on image number 27 of series 5 which is also a new. There is also a new 4 mm subpleural nodule in the left upper lobe on image number 27 of series 5.  Also noted are multiple enlarged mediastinal lymph nodes. The majority of these are in the AP window, including a node with a short axis diameter of 13.5 mm on image number 45. A proximal right hilar node has a short axis diameter of 10 mm on image number 44.  There is an interval oval, mass-like area of in the pericardium on the left, measuring 2.8 x 1.0 cm on image number 69. There is also a small pericardial effusion with a maximum thickness of 7 mm.  There are multiple areas of patchy density in the left upper lobe, lingula and left lower lobe. There is also an interval 3 mm subpleural nodule in the left lower lobe on image number 89, at the inferior aspect of somewhat nodular patchy opacity.  A small left pleural effusion is noted. On the last image, there is a suggestion of a partially imaged left adrenal mass, measuring 1.3 x 0.9 cm. The more inferior portion of the left adrenal gland is not included and the majority of the right adrenal gland is not included.  The remainder of the lungs are mildly hyperexpanded with mildly prominent interstitial markings. Diffuse peribronchial thickening is also noted.  The pulmonary arteries on the right are normally opacified with no pulmonary emboli seen.  Mild diffuse low density of the liver is noted in the upper abdomen. Thoracic spine degenerative changes are noted.  Review of the MIP images confirms the above findings.  IMPRESSION: 1. 8.9  x 6.8 x 5.6 cm left hilar and  mediastinal mass causing almost complete occlusion of the distal left main pulmonary artery with lack of opacification of the majority of the pulmonary arteries on the left. 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 2. Mediastinal and right hilar metastatic adenopathy. 3. Possible left adrenal metastasis. 4. 1.2 cm spiculated left upper lobe nodule, concerning for a primary lung carcinoma. 5. 4 mm left upper lobe subpleural nodules suspicious for a metastasis. 6. Possible left pericardial metastasis measuring 2.8 x 1.0 cm. 7. Patchy opacities most likely representing areas of pulmonary infarction in the left upper lobe, lingula and left lower lobe. An area in the left lower lobe has nodular components. 8. Small left pleural effusion. 9. Small pericardial effusion. 10. COPD. 11. Mild diffuse hepatic steatosis.  These results were called by telephone at the time of interpretation on 12/09/2013 at 12:56 PM to Dr. Antony Contras , who verbally acknowledged these results.   Electronically Signed   By: Enrique Sack M.D.   On: 12/09/2013 12:30   Mr Jeri Cos EG Contrast  12/18/2013   CLINICAL DATA:  Small cell lung cancer, evaluate for brain metastasis.  EXAM: MRI HEAD WITHOUT AND WITH CONTRAST  TECHNIQUE: Multiplanar, multiecho pulse sequences of the brain and surrounding structures were obtained without and with intravenous contrast.  CONTRAST:  17mL MULTIHANCE GADOBENATE DIMEGLUMINE 529 MG/ML IV SOLN  COMPARISON:  CT HEAD WO/W CM dated 05/19/2005  FINDINGS: At least 7 infratentorial mildly enhancing lesions with slight reduced diffusion consistent with hypercellularity consistent with metastasis, largest in the left cerebellum measuring 8 x 9 mm.  At least 19 supratentorial enhancing hypermetabolic metastasis seen, with surrounding bright FLAIR T2 hyperintense vasogenic edema, including a 8 x 9 mm lesion in right temporal lobe, tiny lesions in  the right posterior frontal lobe, motor cortex. 3-4 mm enhancing lesions in the caudate bilaterally. No midline shift or mass effect. Tiny foci of susceptibility artifact associated with a few of the metastasis in the supratentorial brain. A few of the metastasis shows central hypo enhancement consistent with cystic necrosis. No reduced diffusion to suggest acute ischemia. The ventricles and sulci are overall normal for patient's age.  Additional patchy FLAIR T2 hyperintense signal in the pons, with scattered subcentimeter supratentorial T2 hyperintensity suggesting sequelae of chronic small vessel ischemic disease. Right occipital developmental venous anomaly, benign finding.  No abnormal extra-axial fluid collections. Though many of the intracranial metastasis are within the cortex/gray-white matter junction, no convincing evidence of dural or leptomeningeal metastatic disease. Normal major intracranial vascular flow voids seen at the skull base.  Ocular globes and orbital contents are unremarkable. No paranasal sinus air-fluid levels. Mastoid air cells appear well-aerated. Of note, there is mild deformity of the left dorsal spinal cord due to apparent C1-2 arthropathy. No abnormal sellar expansion. No cerebellar tonsillar ectopia.  IMPRESSION: At least 7 infratentorial and 19 supratentorial subcentimeter enhancing intraparenchymal lesions consistent with metastatic disease, some of which are slightly hemorrhagic and/or cystic. Associated mild vasogenic edema without significant mass effect.  Mild-to-moderate additional white matter changes suggest sequelae of chronic small vessel ischemic disease, less likely nonenhancing metastatic disease.   Electronically Signed   By: Elon Alas   On: 12/18/2013 00:23   Dg Fluoro Guide Cv Line-no Report  12/13/2013   CLINICAL DATA: inserting port   FLOURO GUIDE CV LINE  Fluoroscopy was utilized by the requesting physician.  No radiographic  interpretation.    Mm  Screening Breast Tomo Bilateral  12/05/2013  CLINICAL DATA:  Screening.  EXAM: DIGITAL SCREENING BILATERAL MAMMOGRAM WITH 3D TOMO WITH CAD  COMPARISON:  Previous exam(s).  ACR Breast Density Category b: There are scattered areas of fibroglandular density.  FINDINGS: There are no findings suspicious for malignancy. Images were processed with CAD.  IMPRESSION: No mammographic evidence of malignancy. A result letter of this screening mammogram will be mailed directly to the patient.  RECOMMENDATION: Screening mammogram in one year. (Code:SM-B-01Y)  BI-RADS CATEGORY  1: Negative.   Electronically Signed   By: Lovey Newcomer M.D.   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 She is currently undergoing a course of whole brain irradiation under the care of Dr. Sondra Come. She is scheduled to complete his course of whole brain radiation on 12/31/2013. The patient was discussed with also seen by Dr. Julien Nordmann. We will plan to start her first cycle of systemic chemotherapy with carboplatin for an AUC of 5 given on day 1 and etoposide at 120 mg per meter squared given on days 1, 2 and 3 with Neulasta given on day 4 on 01/01/2014. The side effects of chemotherapy including but not limited to nausea vomiting diarrhea or constipation, changes in her blood counts fever were discussed. A prescription for Compazine was sent her pharmacy of record via E. scribed. We will plan to see her for a symptom management visit one week after her first cycle of chemotherapy and this will be scheduled for 01/08/2014. The patient was also advised to take Claritin for a few days in and around time she gets her Neulasta injection. The patient and her family members voiced understanding.     Carlton Adam, PA-C All questions were answered. The patient knows to call the clinic with any problems,  questions or concerns. We can certainly see the patient much sooner if necessary.  ADDENDUM:  Hematology/Oncology Attending:  I had a face to face encounter with the patient. I recommended her care plan. 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 She is currently undergoing whole brain irradiation and the expected to complete this course of whole brain irradiation next week. She is also undergoing palliative radiotherapy to the large mediastinal mass and she noticed improvement in her breathing after starting the palliative radiotherapy. I have a lengthy discussion with the patient and her family today about her systemic treatment options. I recommended for the patient treatment with systemic chemotherapy in the form of carboplatin for AUC of 5 on day 1 and etoposide 120 mg/M2 on days 1, 2 and 3 with Neulasta support on day 4. She is expected to start the first cycle of her treatment on 01/01/2014. I will arrange for the patient to have a chemotherapy education class before starting the first cycle of her treatment. The patient would like to proceed with treatment as planned. She would come back for follow up visit in 2 weeks for reevaluation and management any adverse effect of her chemotherapy. She was advised to call immediately if she has any concerning symptoms in the interval.  Disclaimer: This note was dictated with voice recognition software. Similar sounding words can inadvertently be transcribed and may not be corrected upon review.

## 2013-12-26 NOTE — Telephone Encounter (Signed)
Per staff message and POF I have scheduled appts.  JMW  

## 2013-12-26 NOTE — Telephone Encounter (Signed)
Gave pt appt for lab and MD emailed Sharyn Lull regarding chemo

## 2013-12-27 ENCOUNTER — Ambulatory Visit
Admission: RE | Admit: 2013-12-27 | Discharge: 2013-12-27 | Disposition: A | Discharge status: Home / Self Care | Payer: 59 | Source: Ambulatory Visit | Attending: Radiation Oncology | Admitting: Radiation Oncology

## 2013-12-30 ENCOUNTER — Ambulatory Visit
Admission: RE | Admit: 2013-12-30 | Discharge: 2013-12-30 | Disposition: A | Discharge status: Home / Self Care | Payer: 59 | Source: Ambulatory Visit | Attending: Radiation Oncology | Admitting: Radiation Oncology

## 2013-12-31 ENCOUNTER — Telehealth: Payer: Self-pay | Admitting: Oncology

## 2013-12-31 ENCOUNTER — Ambulatory Visit
Admission: RE | Admit: 2013-12-31 | Discharge: 2013-12-31 | Disposition: A | Discharge status: Home / Self Care | Payer: 59 | Source: Ambulatory Visit | Attending: Radiation Oncology | Admitting: Radiation Oncology

## 2013-12-31 VITALS — BP 115/68 | HR 79 | Temp 98.2°F | Ht 66.0 in | Wt 178.2 lb

## 2013-12-31 DIAGNOSIS — C7931 Secondary malignant neoplasm of brain: Secondary | ICD-10-CM

## 2013-12-31 DIAGNOSIS — C3490 Malignant neoplasm of unspecified part of unspecified bronchus or lung: Secondary | ICD-10-CM

## 2013-12-31 MED ORDER — FLUCONAZOLE 100 MG PO TABS: 100.0000 mg | ORAL_TABLET | Freq: Every day | ORAL | Status: DC

## 2013-12-31 NOTE — Telephone Encounter (Signed)
Left a message for Carol Bond to call back regarding not taking atorvastatin while she is taking diflucan.

## 2013-12-31 NOTE — Progress Notes (Signed)
  Radiation Oncology         [phone]) [phone] ________________________________  Name: PAW KARSTENS MRN: [id number]  Date: 12/31/2013  DOB: 1956/06/27  Weekly Radiation Therapy Management  Current Dose: 30 Gy     Planned Dose:  30 Gy directed at both the brain and left chest mass  Narrative . . . . . . . . The patient presents for routine under treatment assessment.                                   The patient is without complaint except for poor taste and sore throat. She is happy to complete her radiation therapy today. She is scheduled to start chemotherapy later this week.                                 Set-up films were reviewed.  The patient's left chest mass has decreased significantly with our cone beam imaging.                                 The chart was checked. Physical Findings. . .  height is 5\' 6"  (1.676 m) and weight is 178 lb 3.2 oz (80.831 kg). Her temperature is 98.2 F (36.8 C). Her blood pressure is 115/68 and her pulse is 79. Her oxygen saturation is 97%. . Weight essentially stable.  No significant changes. Impression . . . . . . . The patient is tolerating radiation. Plan . . . . . . . . . . . Marland Kitchen routine followup in one month. Today the patient and her daughter were given instructions on a Decadron taper. Patient was given a prescription for Diflucan for the candidiasis infection in the oral cavity. The patient is aware not to take her atorvastatin during her Diflucan.  ________________________________   Blair Promise, PhD, MD

## 2013-12-31 NOTE — Progress Notes (Signed)
Carol Bond has completed treatment to her whole brain and left chest with 10 fractions.  She is reporting burning pain in her mouth.  She does have a white coating on her tongue.  She is taking decadron 4 mg q 8 hours.  She also is reporting pain with swallowing.  She reports dizziness occasionally when laying down.  She denies changes in vision.  She reports occasional problems with balance.  She had nausea last week and started taking 1/2 a compazine before treatment which helped.  She reports an occasional cough.  She reports shortness of breath with talking and activity.  Her oxygen saturation today is 97%,  Her skin is intact in the treatment areas.  She starts chemotherapy on Thursday.

## 2013-12-31 NOTE — Telephone Encounter (Signed)
Carol Bond returned my call and said she would not take lipitor while taking diflucan.

## 2014-01-01 ENCOUNTER — Other Ambulatory Visit: Payer: Self-pay | Admitting: *Deleted

## 2014-01-01 ENCOUNTER — Other Ambulatory Visit: Payer: Self-pay

## 2014-01-01 ENCOUNTER — Other Ambulatory Visit (HOSPITAL_BASED_OUTPATIENT_CLINIC_OR_DEPARTMENT_OTHER): Payer: 59

## 2014-01-01 ENCOUNTER — Other Ambulatory Visit: Payer: Self-pay | Admitting: Internal Medicine

## 2014-01-01 ENCOUNTER — Telehealth: Payer: Self-pay | Admitting: Internal Medicine

## 2014-01-01 ENCOUNTER — Other Ambulatory Visit: Payer: Self-pay | Admitting: Medical Oncology

## 2014-01-01 ENCOUNTER — Ambulatory Visit (HOSPITAL_BASED_OUTPATIENT_CLINIC_OR_DEPARTMENT_OTHER): Payer: 59

## 2014-01-01 VITALS — BP 109/71 | HR 75 | Temp 97.4°F

## 2014-01-01 DIAGNOSIS — C3490 Malignant neoplasm of unspecified part of unspecified bronchus or lung: Secondary | ICD-10-CM

## 2014-01-01 DIAGNOSIS — C3410 Malignant neoplasm of upper lobe, unspecified bronchus or lung: Secondary | ICD-10-CM

## 2014-01-01 DIAGNOSIS — C7931 Secondary malignant neoplasm of brain: Secondary | ICD-10-CM

## 2014-01-01 DIAGNOSIS — C7949 Secondary malignant neoplasm of other parts of nervous system: Secondary | ICD-10-CM

## 2014-01-01 DIAGNOSIS — Z95828 Presence of other vascular implants and grafts: Secondary | ICD-10-CM

## 2014-01-01 DIAGNOSIS — Z5111 Encounter for antineoplastic chemotherapy: Secondary | ICD-10-CM

## 2014-01-01 LAB — CBC WITH DIFFERENTIAL/PLATELET
BASO%: 0 % (ref 0.0–2.0)
BASOS ABS: 0 10*3/uL (ref 0.0–0.1)
EOS%: 0.2 % (ref 0.0–7.0)
Eosinophils Absolute: 0 10*3/uL (ref 0.0–0.5)
HEMATOCRIT: 38.1 % (ref 34.8–46.6)
HEMOGLOBIN: 12.4 g/dL (ref 11.6–15.9)
LYMPH#: 0.3 10*3/uL — AB (ref 0.9–3.3)
LYMPH%: 4.3 % — ABNORMAL LOW (ref 14.0–49.7)
MCH: 29 pg (ref 25.1–34.0)
MCHC: 32.4 g/dL (ref 31.5–36.0)
MCV: 89.4 fL (ref 79.5–101.0)
MONO#: 0.3 10*3/uL (ref 0.1–0.9)
MONO%: 4 % (ref 0.0–14.0)
NEUT%: 91.5 % — ABNORMAL HIGH (ref 38.4–76.8)
NEUTROS ABS: 7.2 10*3/uL — AB (ref 1.5–6.5)
PLATELETS: 267 10*3/uL (ref 145–400)
RBC: 4.27 10*6/uL (ref 3.70–5.45)
RDW: 15 % — ABNORMAL HIGH (ref 11.2–14.5)
WBC: 7.8 10*3/uL (ref 3.9–10.3)

## 2014-01-01 LAB — COMPREHENSIVE METABOLIC PANEL (CC13)
ALT: 42 U/L (ref 0–55)
AST: 11 U/L (ref 5–34)
Albumin: 3.6 g/dL (ref 3.5–5.0)
Alkaline Phosphatase: 48 U/L (ref 40–150)
Anion Gap: 11 mEq/L (ref 3–11)
BUN: 21.5 mg/dL (ref 7.0–26.0)
CALCIUM: 9.8 mg/dL (ref 8.4–10.4)
CHLORIDE: 104 meq/L (ref 98–109)
CO2: 25 meq/L (ref 22–29)
Creatinine: 0.8 mg/dL (ref 0.6–1.1)
Glucose: 298 mg/dl — ABNORMAL HIGH (ref 70–140)
Potassium: 4.5 mEq/L (ref 3.5–5.1)
Sodium: 140 mEq/L (ref 136–145)
Total Bilirubin: 0.27 mg/dL (ref 0.20–1.20)
Total Protein: 6.7 g/dL (ref 6.4–8.3)

## 2014-01-01 MED ORDER — SODIUM CHLORIDE 0.9 % IV SOLN
120.0000 mg/m2 | Freq: Once | INTRAVENOUS | Status: AC
  Administered 2014-01-01: 240 mg via INTRAVENOUS
  Filled 2014-01-01: qty 12

## 2014-01-01 MED ORDER — LIDOCAINE-PRILOCAINE 2.5-2.5 % EX CREA: 1.0000 "application " | TOPICAL_CREAM | CUTANEOUS | Status: DC | PRN

## 2014-01-01 MED ORDER — SODIUM CHLORIDE 0.9 % IV SOLN
Freq: Once | INTRAVENOUS | Status: AC
  Administered 2014-01-01: 15:00:00 via INTRAVENOUS

## 2014-01-01 MED ORDER — ONDANSETRON 8 MG/NS 50 ML IVPB
INTRAVENOUS | Status: AC
  Filled 2014-01-01: qty 8

## 2014-01-01 MED ORDER — HEPARIN SOD (PORK) LOCK FLUSH 100 UNIT/ML IV SOLN
500.0000 [IU] | Freq: Once | INTRAVENOUS | Status: AC | PRN
  Administered 2014-01-01: 500 [IU]
  Filled 2014-01-01: qty 5

## 2014-01-01 MED ORDER — CARBOPLATIN CHEMO INJECTION 600 MG/60ML
650.0000 mg | Freq: Once | INTRAVENOUS | Status: AC
  Administered 2014-01-01: 650 mg via INTRAVENOUS
  Filled 2014-01-01: qty 65

## 2014-01-01 MED ORDER — SODIUM CHLORIDE 0.9 % IV SOLN: Freq: Once | INTRAVENOUS | Status: DC

## 2014-01-01 MED ORDER — DEXAMETHASONE SODIUM PHOSPHATE 10 MG/ML IJ SOLN
INTRAMUSCULAR | Status: AC
  Filled 2014-01-01: qty 1

## 2014-01-01 MED ORDER — SODIUM CHLORIDE 0.9 % IJ SOLN
10.0000 mL | INTRAMUSCULAR | Status: DC | PRN
  Administered 2014-01-01: 10 mL
  Filled 2014-01-01: qty 10

## 2014-01-01 MED ORDER — DEXAMETHASONE SODIUM PHOSPHATE 10 MG/ML IJ SOLN
10.0000 mg | Freq: Once | INTRAMUSCULAR | Status: AC
  Administered 2014-01-01: 10 mg via INTRAVENOUS

## 2014-01-01 MED ORDER — ONDANSETRON 8 MG/50ML IVPB (CHCC)
8.0000 mg | Freq: Once | INTRAVENOUS | Status: AC
  Administered 2014-01-01: 8 mg via INTRAVENOUS

## 2014-01-01 NOTE — Telephone Encounter (Signed)
emla cream called in.

## 2014-01-01 NOTE — Telephone Encounter (Signed)
Called pt and advised to get appt calendar tomorrow

## 2014-01-01 NOTE — Patient Instructions (Signed)
[facility] Discharge Instructions for Patients Receiving Chemotherapy  Today you received the following chemotherapy agents carboplatin/etoposide.   To help prevent nausea and vomiting after your treatment, we encourage you to take your nausea medication as directed.  If you develop nausea and vomiting that is not controlled by your nausea medication, call the clinic.   BELOW ARE SYMPTOMS THAT SHOULD BE REPORTED IMMEDIATELY:  *FEVER GREATER THAN 100.5 F  *CHILLS WITH OR WITHOUT FEVER  NAUSEA AND VOMITING THAT IS NOT CONTROLLED WITH YOUR NAUSEA MEDICATION  *UNUSUAL SHORTNESS OF BREATH  *UNUSUAL BRUISING OR BLEEDING  TENDERNESS IN MOUTH AND THROAT WITH OR WITHOUT PRESENCE OF ULCERS  *URINARY PROBLEMS  *BOWEL PROBLEMS  UNUSUAL RASH Items with * indicate a potential emergency and should be followed up as soon as possible.  Feel free to call the clinic you have any questions or concerns. The clinic phone number is [phone].  Etoposide, VP-16 injection What is this medicine? ETOPOSIDE, VP-16 (e toe POE side) is a chemotherapy drug. It is used to treat testicular cancer, lung cancer, and other cancers. This medicine may be used for other purposes; ask your health care provider or pharmacist if you have questions. COMMON BRAND NAME(S): Etopophos, Toposar, VePesid What should I tell my health care provider before I take this medicine? They need to know if you have any of these conditions: -infection -kidney disease -low blood counts, like low white cell, platelet, or red cell counts -an unusual or allergic reaction to etoposide, other chemotherapeutic agents, other medicines, foods, dyes, or preservatives -pregnant or trying to get pregnant -breast-feeding How should I use this medicine? This medicine is for infusion into a vein. It is administered in a hospital or clinic by a specially trained health care professional. Talk to your pediatrician regarding the  use of this medicine in children. Special care may be needed. Overdosage: If you think you have taken too much of this medicine contact a poison control center or emergency room at once. NOTE: This medicine is only for you. Do not share this medicine with others. What if I miss a dose? It is important not to miss your dose. Call your doctor or health care professional if you are unable to keep an appointment. What may interact with this medicine? -cyclosporine -medicines to increase blood counts like filgrastim, pegfilgrastim, sargramostim -vaccines This list may not describe all possible interactions. Give your health care provider a list of all the medicines, herbs, non-prescription drugs, or dietary supplements you use. Also tell them if you smoke, drink alcohol, or use illegal drugs. Some items may interact with your medicine. What should I watch for while using this medicine? Visit your doctor for checks on your progress. This drug may make you feel generally unwell. This is not uncommon, as chemotherapy can affect healthy cells as well as cancer cells. Report any side effects. Continue your course of treatment even though you feel ill unless your doctor tells you to stop. In some cases, you may be given additional medicines to help with side effects. Follow all directions for their use. Call your doctor or health care professional for advice if you get a fever, chills or sore throat, or other symptoms of a cold or flu. Do not treat yourself. This drug decreases your body's ability to fight infections. Try to avoid being around people who are sick. This medicine may increase your risk to bruise or bleed. Call your doctor or health care professional if you notice  any unusual bleeding. Be careful brushing and flossing your teeth or using a toothpick because you may get an infection or bleed more easily. If you have any dental work done, tell your dentist you are receiving this medicine. Avoid  taking products that contain aspirin, acetaminophen, ibuprofen, naproxen, or ketoprofen unless instructed by your doctor. These medicines may hide a fever. Do not become pregnant while taking this medicine. Women should inform their doctor if they wish to become pregnant or think they might be pregnant. There is a potential for serious side effects to an unborn child. Talk to your health care professional or pharmacist for more information. Do not breast-feed an infant while taking this medicine. What side effects may I notice from receiving this medicine? Side effects that you should report to your doctor or health care professional as soon as possible: -allergic reactions like skin rash, itching or hives, swelling of the face, lips, or tongue -low blood counts - this medicine may decrease the number of white blood cells, red blood cells and platelets. You may be at increased risk for infections and bleeding. -signs of infection - fever or chills, cough, sore throat, pain or difficulty passing urine -signs of decreased platelets or bleeding - bruising, pinpoint red spots on the skin, black, tarry stools, blood in the urine -signs of decreased red blood cells - unusually weak or tired, fainting spells, lightheadedness -breathing problems -changes in vision -mouth or throat sores or ulcers -pain, redness, swelling or irritation at the injection site -pain, tingling, numbness in the hands or feet -redness, blistering, peeling or loosening of the skin, including inside the mouth -seizures -vomiting Side effects that usually do not require medical attention (report to your doctor or health care professional if they continue or are bothersome): -diarrhea -hair loss -loss of appetite -nausea -stomach pain This list may not describe all possible side effects. Call your doctor for medical advice about side effects. You may report side effects to FDA at 1-800-FDA-1088. Where should I keep my  medicine? This drug is given in a hospital or clinic and will not be stored at home. NOTE: This sheet is a summary. It may not cover all possible information. If you have questions about this medicine, talk to your doctor, pharmacist, or health care provider.  2014, [employer] Standard. (2007-12-17 17:24:12)   Carboplatin injection What is this medicine? CARBOPLATIN (KAR boe pla tin) is a chemotherapy drug. It targets fast dividing cells, like cancer cells, and causes these cells to die. This medicine is used to treat ovarian cancer and many other cancers. This medicine may be used for other purposes; ask your health care provider or pharmacist if you have questions. COMMON BRAND NAME(S): Paraplatin What should I tell my health care provider before I take this medicine? They need to know if you have any of these conditions: -blood disorders -hearing problems -kidney disease -recent or ongoing radiation therapy -an unusual or allergic reaction to carboplatin, cisplatin, other chemotherapy, other medicines, foods, dyes, or preservatives -pregnant or trying to get pregnant -breast-feeding How should I use this medicine? This drug is usually given as an infusion into a vein. It is administered in a hospital or clinic by a specially trained health care professional. Talk to your pediatrician regarding the use of this medicine in children. Special care may be needed. Overdosage: If you think you have taken too much of this medicine contact a poison control center or emergency room at once. NOTE: This medicine is only for  you. Do not share this medicine with others. What if I miss a dose? It is important not to miss a dose. Call your doctor or health care professional if you are unable to keep an appointment. What may interact with this medicine? -medicines for seizures -medicines to increase blood counts like filgrastim, pegfilgrastim, sargramostim -some antibiotics like amikacin,  gentamicin, neomycin, streptomycin, tobramycin -vaccines Talk to your doctor or health care professional before taking any of these medicines: -acetaminophen -aspirin -ibuprofen -ketoprofen -naproxen This list may not describe all possible interactions. Give your health care provider a list of all the medicines, herbs, non-prescription drugs, or dietary supplements you use. Also tell them if you smoke, drink alcohol, or use illegal drugs. Some items may interact with your medicine. What should I watch for while using this medicine? Your condition will be monitored carefully while you are receiving this medicine. You will need important blood work done while you are taking this medicine. This drug may make you feel generally unwell. This is not uncommon, as chemotherapy can affect healthy cells as well as cancer cells. Report any side effects. Continue your course of treatment even though you feel ill unless your doctor tells you to stop. In some cases, you may be given additional medicines to help with side effects. Follow all directions for their use. Call your doctor or health care professional for advice if you get a fever, chills or sore throat, or other symptoms of a cold or flu. Do not treat yourself. This drug decreases your body's ability to fight infections. Try to avoid being around people who are sick. This medicine may increase your risk to bruise or bleed. Call your doctor or health care professional if you notice any unusual bleeding. Be careful brushing and flossing your teeth or using a toothpick because you may get an infection or bleed more easily. If you have any dental work done, tell your dentist you are receiving this medicine. Avoid taking products that contain aspirin, acetaminophen, ibuprofen, naproxen, or ketoprofen unless instructed by your doctor. These medicines may hide a fever. Do not become pregnant while taking this medicine. Women should inform their doctor if they  wish to become pregnant or think they might be pregnant. There is a potential for serious side effects to an unborn child. Talk to your health care professional or pharmacist for more information. Do not breast-feed an infant while taking this medicine. What side effects may I notice from receiving this medicine? Side effects that you should report to your doctor or health care professional as soon as possible: -allergic reactions like skin rash, itching or hives, swelling of the face, lips, or tongue -signs of infection - fever or chills, cough, sore throat, pain or difficulty passing urine -signs of decreased platelets or bleeding - bruising, pinpoint red spots on the skin, black, tarry stools, nosebleeds -signs of decreased red blood cells - unusually weak or tired, fainting spells, lightheadedness -breathing problems -changes in hearing -changes in vision -chest pain -high blood pressure -low blood counts - This drug may decrease the number of white blood cells, red blood cells and platelets. You may be at increased risk for infections and bleeding. -nausea and vomiting -pain, swelling, redness or irritation at the injection site -pain, tingling, numbness in the hands or feet -problems with balance, talking, walking -trouble passing urine or change in the amount of urine Side effects that usually do not require medical attention (report to your doctor or health care professional if they  continue or are bothersome): -hair loss -loss of appetite -metallic taste in the mouth or changes in taste This list may not describe all possible side effects. Call your doctor for medical advice about side effects. You may report side effects to FDA at 1-800-FDA-1088. Where should I keep my medicine? This drug is given in a hospital or clinic and will not be stored at home. NOTE: This sheet is a summary. It may not cover all possible information. If you have questions about this medicine, talk to your  doctor, pharmacist, or health care provider.  2014, [employer] Standard. (2007-11-20 14:38:05)

## 2014-01-02 ENCOUNTER — Ambulatory Visit (HOSPITAL_BASED_OUTPATIENT_CLINIC_OR_DEPARTMENT_OTHER): Payer: 59

## 2014-01-02 ENCOUNTER — Ambulatory Visit (HOSPITAL_COMMUNITY)
Admission: RE | Admit: 2014-01-02 | Discharge: 2014-01-02 | Disposition: A | Discharge status: Home / Self Care | Payer: 59 | Source: Ambulatory Visit | Attending: Internal Medicine | Admitting: Internal Medicine

## 2014-01-02 ENCOUNTER — Encounter (HOSPITAL_COMMUNITY): Payer: Self-pay

## 2014-01-02 VITALS — BP 102/63 | HR 75 | Temp 98.4°F

## 2014-01-02 DIAGNOSIS — C781 Secondary malignant neoplasm of mediastinum: Secondary | ICD-10-CM | POA: Insufficient documentation

## 2014-01-02 DIAGNOSIS — Z5111 Encounter for antineoplastic chemotherapy: Secondary | ICD-10-CM

## 2014-01-02 DIAGNOSIS — C782 Secondary malignant neoplasm of pleura: Secondary | ICD-10-CM | POA: Insufficient documentation

## 2014-01-02 DIAGNOSIS — I517 Cardiomegaly: Secondary | ICD-10-CM | POA: Insufficient documentation

## 2014-01-02 DIAGNOSIS — C7949 Secondary malignant neoplasm of other parts of nervous system: Secondary | ICD-10-CM

## 2014-01-02 DIAGNOSIS — C3490 Malignant neoplasm of unspecified part of unspecified bronchus or lung: Secondary | ICD-10-CM

## 2014-01-02 DIAGNOSIS — J984 Other disorders of lung: Secondary | ICD-10-CM | POA: Insufficient documentation

## 2014-01-02 DIAGNOSIS — C786 Secondary malignant neoplasm of retroperitoneum and peritoneum: Secondary | ICD-10-CM | POA: Insufficient documentation

## 2014-01-02 DIAGNOSIS — C7970 Secondary malignant neoplasm of unspecified adrenal gland: Secondary | ICD-10-CM | POA: Insufficient documentation

## 2014-01-02 DIAGNOSIS — C3410 Malignant neoplasm of upper lobe, unspecified bronchus or lung: Secondary | ICD-10-CM

## 2014-01-02 DIAGNOSIS — C7931 Secondary malignant neoplasm of brain: Secondary | ICD-10-CM

## 2014-01-02 LAB — GLUCOSE, CAPILLARY: GLUCOSE-CAPILLARY: 156 mg/dL — AB (ref 70–99)

## 2014-01-02 MED ORDER — HEPARIN SOD (PORK) LOCK FLUSH 100 UNIT/ML IV SOLN
500.0000 [IU] | Freq: Once | INTRAVENOUS | Status: AC | PRN
  Administered 2014-01-02: 500 [IU]
  Filled 2014-01-02: qty 5

## 2014-01-02 MED ORDER — ONDANSETRON 8 MG/NS 50 ML IVPB
INTRAVENOUS | Status: AC
  Filled 2014-01-02: qty 8

## 2014-01-02 MED ORDER — ONDANSETRON 8 MG/50ML IVPB (CHCC)
8.0000 mg | Freq: Once | INTRAVENOUS | Status: AC
  Administered 2014-01-02: 8 mg via INTRAVENOUS

## 2014-01-02 MED ORDER — DEXAMETHASONE SODIUM PHOSPHATE 10 MG/ML IJ SOLN
10.0000 mg | Freq: Once | INTRAMUSCULAR | Status: AC
  Administered 2014-01-02: 10 mg via INTRAVENOUS

## 2014-01-02 MED ORDER — DEXAMETHASONE SODIUM PHOSPHATE 10 MG/ML IJ SOLN
INTRAMUSCULAR | Status: AC
  Filled 2014-01-02: qty 1

## 2014-01-02 MED ORDER — SODIUM CHLORIDE 0.9 % IV SOLN
120.0000 mg/m2 | Freq: Once | INTRAVENOUS | Status: AC
  Administered 2014-01-02: 240 mg via INTRAVENOUS
  Filled 2014-01-02: qty 12

## 2014-01-02 MED ORDER — FLUDEOXYGLUCOSE F - 18 (FDG) INJECTION
9.3000 | Freq: Once | INTRAVENOUS | Status: AC | PRN
  Administered 2014-01-02: 9.3 via INTRAVENOUS

## 2014-01-02 MED ORDER — SODIUM CHLORIDE 0.9 % IV SOLN: Freq: Once | INTRAVENOUS | Status: DC

## 2014-01-02 MED ORDER — SODIUM CHLORIDE 0.9 % IJ SOLN
10.0000 mL | INTRAMUSCULAR | Status: DC | PRN
  Administered 2014-01-02: 10 mL
  Filled 2014-01-02: qty 10

## 2014-01-02 MED ORDER — SODIUM CHLORIDE 0.9 % IV SOLN
Freq: Once | INTRAVENOUS | Status: AC
  Administered 2014-01-02: 09:00:00 via INTRAVENOUS

## 2014-01-03 ENCOUNTER — Ambulatory Visit (HOSPITAL_BASED_OUTPATIENT_CLINIC_OR_DEPARTMENT_OTHER): Payer: 59

## 2014-01-03 ENCOUNTER — Other Ambulatory Visit: Payer: 59

## 2014-01-03 VITALS — BP 104/67 | HR 70 | Temp 98.1°F | Resp 20

## 2014-01-03 DIAGNOSIS — Z5111 Encounter for antineoplastic chemotherapy: Secondary | ICD-10-CM

## 2014-01-03 DIAGNOSIS — C7931 Secondary malignant neoplasm of brain: Secondary | ICD-10-CM

## 2014-01-03 DIAGNOSIS — C7949 Secondary malignant neoplasm of other parts of nervous system: Secondary | ICD-10-CM

## 2014-01-03 DIAGNOSIS — C3410 Malignant neoplasm of upper lobe, unspecified bronchus or lung: Secondary | ICD-10-CM

## 2014-01-03 DIAGNOSIS — C3490 Malignant neoplasm of unspecified part of unspecified bronchus or lung: Secondary | ICD-10-CM

## 2014-01-03 MED ORDER — SODIUM CHLORIDE 0.9 % IV SOLN
120.0000 mg/m2 | Freq: Once | INTRAVENOUS | Status: AC
  Administered 2014-01-03: 240 mg via INTRAVENOUS
  Filled 2014-01-03: qty 12

## 2014-01-03 MED ORDER — ONDANSETRON 8 MG/50ML IVPB (CHCC)
8.0000 mg | Freq: Once | INTRAVENOUS | Status: AC
  Administered 2014-01-03: 8 mg via INTRAVENOUS

## 2014-01-03 MED ORDER — DEXAMETHASONE SODIUM PHOSPHATE 10 MG/ML IJ SOLN
INTRAMUSCULAR | Status: AC
  Filled 2014-01-03: qty 1

## 2014-01-03 MED ORDER — DEXAMETHASONE SODIUM PHOSPHATE 10 MG/ML IJ SOLN
10.0000 mg | Freq: Once | INTRAMUSCULAR | Status: AC
  Administered 2014-01-03: 10 mg via INTRAVENOUS

## 2014-01-03 MED ORDER — SODIUM CHLORIDE 0.9 % IJ SOLN
10.0000 mL | INTRAMUSCULAR | Status: DC | PRN
  Administered 2014-01-03: 10 mL
  Filled 2014-01-03: qty 10

## 2014-01-03 MED ORDER — HEPARIN SOD (PORK) LOCK FLUSH 100 UNIT/ML IV SOLN
500.0000 [IU] | Freq: Once | INTRAVENOUS | Status: AC | PRN
  Administered 2014-01-03: 500 [IU]
  Filled 2014-01-03: qty 5

## 2014-01-03 MED ORDER — ONDANSETRON 8 MG/NS 50 ML IVPB
INTRAVENOUS | Status: AC
  Filled 2014-01-03: qty 8

## 2014-01-03 MED ORDER — SODIUM CHLORIDE 0.9 % IV SOLN
Freq: Once | INTRAVENOUS | Status: AC
  Administered 2014-01-03: 12:00:00 via INTRAVENOUS

## 2014-01-03 NOTE — Patient Instructions (Signed)
[facility] Discharge Instructions for Patients Receiving Chemotherapy  Today you received the following chemotherapy agents:  Etoposide  To help prevent nausea and vomiting after your treatment, we encourage you to take your nausea medication as ordered per MD.   If you develop nausea and vomiting that is not controlled by your nausea medication, call the clinic.   BELOW ARE SYMPTOMS THAT SHOULD BE REPORTED IMMEDIATELY:  *FEVER GREATER THAN 100.5 F  *CHILLS WITH OR WITHOUT FEVER  NAUSEA AND VOMITING THAT IS NOT CONTROLLED WITH YOUR NAUSEA MEDICATION  *UNUSUAL SHORTNESS OF BREATH  *UNUSUAL BRUISING OR BLEEDING  TENDERNESS IN MOUTH AND THROAT WITH OR WITHOUT PRESENCE OF ULCERS  *URINARY PROBLEMS  *BOWEL PROBLEMS  UNUSUAL RASH Items with * indicate a potential emergency and should be followed up as soon as possible.  Feel free to call the clinic you have any questions or concerns. The clinic phone number is [phone].

## 2014-01-04 ENCOUNTER — Ambulatory Visit (HOSPITAL_BASED_OUTPATIENT_CLINIC_OR_DEPARTMENT_OTHER): Payer: 59

## 2014-01-04 VITALS — BP 124/68 | HR 77 | Temp 98.2°F | Resp 20

## 2014-01-04 DIAGNOSIS — C7949 Secondary malignant neoplasm of other parts of nervous system: Secondary | ICD-10-CM

## 2014-01-04 DIAGNOSIS — Z5189 Encounter for other specified aftercare: Secondary | ICD-10-CM

## 2014-01-04 DIAGNOSIS — C3410 Malignant neoplasm of upper lobe, unspecified bronchus or lung: Secondary | ICD-10-CM

## 2014-01-04 DIAGNOSIS — C7931 Secondary malignant neoplasm of brain: Secondary | ICD-10-CM

## 2014-01-04 MED ORDER — PEGFILGRASTIM INJECTION 6 MG/0.6ML
6.0000 mg | Freq: Once | SUBCUTANEOUS | Status: AC
  Administered 2014-01-04: 6 mg via SUBCUTANEOUS

## 2014-01-06 ENCOUNTER — Telehealth: Payer: Self-pay | Admitting: *Deleted

## 2014-01-06 NOTE — Telephone Encounter (Signed)
Unable to reach patient, left detailed voicemail to call us if she feels she is having any ill effects from the chemo. Otherwise, patient is to keep appt on wed 5/13 for lab/AJ

## 2014-01-06 NOTE — Telephone Encounter (Signed)
Message copied by Verlon Setting on Mon Jan 06, 2014  1:28 PM ------      Message from: Arty Baumgartner      Created: Wed Jan 01, 2014  2:36 PM      Regarding: first time follow up       First time carbo/etoposide.  Dr. Julien Nordmann.  [phone] ------

## 2014-01-08 ENCOUNTER — Other Ambulatory Visit (HOSPITAL_BASED_OUTPATIENT_CLINIC_OR_DEPARTMENT_OTHER): Payer: 59

## 2014-01-08 ENCOUNTER — Telehealth: Payer: Self-pay | Admitting: Internal Medicine

## 2014-01-08 ENCOUNTER — Ambulatory Visit (HOSPITAL_BASED_OUTPATIENT_CLINIC_OR_DEPARTMENT_OTHER): Payer: 59 | Admitting: Physician Assistant

## 2014-01-08 ENCOUNTER — Encounter: Payer: Self-pay | Admitting: Physician Assistant

## 2014-01-08 VITALS — BP 132/65 | HR 111 | Temp 98.4°F | Resp 19 | Ht 66.0 in | Wt 177.3 lb

## 2014-01-08 DIAGNOSIS — C7949 Secondary malignant neoplasm of other parts of nervous system: Secondary | ICD-10-CM

## 2014-01-08 DIAGNOSIS — C3490 Malignant neoplasm of unspecified part of unspecified bronchus or lung: Secondary | ICD-10-CM

## 2014-01-08 DIAGNOSIS — C7931 Secondary malignant neoplasm of brain: Secondary | ICD-10-CM

## 2014-01-08 DIAGNOSIS — C3410 Malignant neoplasm of upper lobe, unspecified bronchus or lung: Secondary | ICD-10-CM

## 2014-01-08 LAB — CBC WITH DIFFERENTIAL/PLATELET
BASO%: 0.2 % (ref 0.0–2.0)
Basophils Absolute: 0 10*3/uL (ref 0.0–0.1)
EOS%: 0.4 % (ref 0.0–7.0)
Eosinophils Absolute: 0 10*3/uL (ref 0.0–0.5)
HCT: 38.4 % (ref 34.8–46.6)
HGB: 12.3 g/dL (ref 11.6–15.9)
LYMPH%: 7.8 % — ABNORMAL LOW (ref 14.0–49.7)
MCH: 28.6 pg (ref 25.1–34.0)
MCHC: 31.9 g/dL (ref 31.5–36.0)
MCV: 89.7 fL (ref 79.5–101.0)
MONO#: 0.1 10*3/uL (ref 0.1–0.9)
MONO%: 3.9 % (ref 0.0–14.0)
NEUT#: 1.6 10*3/uL (ref 1.5–6.5)
NEUT%: 87.7 % — ABNORMAL HIGH (ref 38.4–76.8)
Platelets: 134 10*3/uL — ABNORMAL LOW (ref 145–400)
RBC: 4.28 10*6/uL (ref 3.70–5.45)
RDW: 16 % — ABNORMAL HIGH (ref 11.2–14.5)
WBC: 1.9 10*3/uL — ABNORMAL LOW (ref 3.9–10.3)
lymph#: 0.1 10*3/uL — ABNORMAL LOW (ref 0.9–3.3)

## 2014-01-08 LAB — COMPREHENSIVE METABOLIC PANEL (CC13)
ALBUMIN: 3.8 g/dL (ref 3.5–5.0)
ALK PHOS: 60 U/L (ref 40–150)
ALT: 60 U/L — AB (ref 0–55)
AST: 18 U/L (ref 5–34)
Anion Gap: 13 mEq/L — ABNORMAL HIGH (ref 3–11)
BILIRUBIN TOTAL: 0.5 mg/dL (ref 0.20–1.20)
BUN: 20.9 mg/dL (ref 7.0–26.0)
CO2: 22 mEq/L (ref 22–29)
Calcium: 10 mg/dL (ref 8.4–10.4)
Chloride: 103 mEq/L (ref 98–109)
Creatinine: 0.8 mg/dL (ref 0.6–1.1)
Glucose: 315 mg/dl — ABNORMAL HIGH (ref 70–140)
POTASSIUM: 4.7 meq/L (ref 3.5–5.1)
SODIUM: 139 meq/L (ref 136–145)
TOTAL PROTEIN: 6.8 g/dL (ref 6.4–8.3)

## 2014-01-08 MED ORDER — MAGIC MOUTHWASH: 5.0000 mL | Freq: Four times a day (QID) | ORAL | Status: DC | PRN

## 2014-01-08 NOTE — Progress Notes (Addendum)
No images are attached to the encounter. No scans are attached to the encounter. No scans are attached to the encounter. Carol Bond VISIT PROGRESS NOTE  [place], [address] [place] [id number]  DIAGNOSIS: Lung cancer   Primary site: Lung (Left)   Staging method: AJCC 7th Edition   Clinical free text: Extensive stage small cell lung cancer   Clinical: Stage IV (T3, N2, M1b) signed by Curt Bears, MD on 12/17/2013  7:45 PM   Summary: Stage IV (T3, N2, M1b)  PRIOR THERAPY:Whole brain irradiation under the care of Dr. Sondra Come  CURRENT THERAPY:  Systemic chemotherapy with carboplatin for an AUC of 5 given on day 1, etoposide at 120 mg per meter square given on days 1, 2 and 3 with Neulasta support given on day 4. Systemic chemotherapy on hold until patient completes whole brain irradiation. Cycle #1  given starting on 01/01/2014  DISEASE STAGE: Lung cancer   Primary site: Lung (Left)   Staging method: AJCC 7th Edition   Clinical free text: Extensive stage small cell lung cancer   Clinical: Stage IV (T3, N2, M1b) signed by Curt Bears, MD on 12/17/2013  7:45 PM   Summary: Stage IV (T3, N2, M1b)  CHEMOTHERAPY INTENT: Palliative  CURRENT # OF CHEMOTHERAPY CYCLES: 1  CURRENT ANTIEMETICS: none  CURRENT SMOKING STATUS: Former smoker, quit July 2014  ORAL CHEMOTHERAPY AND CONSENT: n/a  CURRENT BISPHOSPHONATES USE: none  PAIN MANAGEMENT: oxycodone  NARCOTICS INDUCED CONSTIPATION:  none  LIVING WILL AND CODE STATUS:    INTERVAL HISTORY: Carol Bond 58 y.o. female returns for a symptom management visit for followup of her recently diagnosed extensive stage small cell lung cancer. She completed whole brain radiation under the care Dr. Windle Guard.12/31/2048. She status post 1 cycle of systemic chemotherapy with carboplatin and etoposide with Neulasta support. Overall she tolerated her first cycle of chemotherapy relatively well with  the exception of some generalized weakness and some achiness in her shoulders after the Neulasta injection. Her appetite remains good and she denied any fever, chills, diarrhea or constipation. No nausea or vomiting. She does have some beginning mouth soreness and requests a refill for her [employer] Magic mouthwash. She had a staging PET scan performed 01/02/2014 and presents to discuss the results of that study. She tolerated her first cycle of overall well and would like to return to work. She states that she is able to sit at her desk to do her work and feels that she could do at least half to three-quarter days. Her employer is supportive of this schedule.  She denied any fever, chills, nausea, vomiting, diarrhea or constipation. She voiced no other specific complaints other than those listed above.  MEDICAL HISTORY: Past Medical History  Diagnosis Date  . Pulmonary embolism   . Hypothyroid   . Diabetes   . Cancer     Lung    ALLERGIES:  is allergic to actos and vicodin.  MEDICATIONS:  Current Outpatient Prescriptions  Medication Sig Dispense Refill  . aspirin 81 MG tablet Take 162 mg by mouth once.      Marland Kitchen atorvastatin (LIPITOR) 40 MG tablet Take 40 mg by mouth daily.      . Canagliflozin (INVOKANA) 300 MG TABS Take 1 tablet by mouth daily before breakfast.      . chlorpheniramine-HYDROcodone (TUSSIONEX) 10-8 MG/5ML LQCR Take 5 mLs by mouth every 12 (twelve) hours as needed for cough.      Marland Kitchen  dexamethasone (DECADRON) 4 MG tablet Take 1 tablet (4 mg total) by mouth every 8 (eight) hours.  60 tablet  0  . emollient (BIAFINE) cream Apply topically 2 (two) times daily.      Marland Kitchen enoxaparin (LOVENOX) 40 MG/0.4ML injection Inject 0.4 mLs (40 mg total) into the skin daily.  30 Syringe  1  . escitalopram (LEXAPRO) 10 MG tablet Take 10 mg by mouth daily.      Marland Kitchen esomeprazole (NEXIUM) 40 MG capsule Take 40 mg by mouth daily at 12 noon.      . fexofenadine (ALLEGRA) 30 MG tablet Take 30 mg by mouth 2 (two)  times daily.      Marland Kitchen glimepiride (AMARYL) 4 MG tablet Take 4 mg by mouth daily with breakfast.      . insulin glargine (LANTUS) 100 unit/mL SOPN Inject 0.05 mLs (5 Units total) into the skin at bedtime. Solostar Pen  15 mL  11  . levothyroxine (SYNTHROID, LEVOTHROID) 150 MCG tablet Take 150 mcg by mouth daily before breakfast.      . lidocaine-prilocaine (EMLA) cream Apply 1 application topically as needed. Apply to port a cath site 90 minutes prior to chemotherapy appointment.  30 g  0  . loratadine (CLARITIN) 10 MG tablet Take 10 mg by mouth daily.      Marland Kitchen LOSARTAN POTASSIUM PO Take 50 mg by mouth daily.      . Oxycodone HCl 10 MG TABS Take 1 tablet (10 mg total) by mouth every 8 (eight) hours as needed (Pain).  60 tablet  0  . prochlorperazine (COMPAZINE) 10 MG tablet Take 1 tablet (10 mg total) by mouth every 6 (six) hours as needed for nausea or vomiting.  30 tablet  2  . sitaGLIPtin-metformin (JANUMET) 50-1000 MG per tablet Take 1 tablet by mouth 2 (two) times daily with a meal.      . triamcinolone (NASACORT AQ) 55 MCG/ACT AERO nasal inhaler Place 1 spray into the nose daily.      Marland Kitchen albuterol (PROVENTIL HFA;VENTOLIN HFA) 108 (90 BASE) MCG/ACT inhaler Inhale 2 puffs into the lungs every 6 (six) hours as needed for wheezing or shortness of breath.  1 Inhaler  2  . Alum & Mag Hydroxide-Simeth (MAGIC MOUTHWASH) SOLN Take 5 mLs by mouth 4 (four) times daily as needed for mouth pain.  240 mL  0  . beclomethasone (QVAR) 80 MCG/ACT inhaler Inhale 2 puffs into the lungs at bedtime.      . betamethasone dipropionate (DIPROLENE) 0.05 % cream Apply 1 application topically daily as needed.      . fenofibrate 160 MG tablet Take 160 mg by mouth daily.       No current facility-administered medications for this visit.    SURGICAL HISTORY:  Past Surgical History  Procedure Laterality Date  . Abdominal hysterectomy    . Spine surgery    . Cervical fusion    . Video bronchoscopy with endobronchial  ultrasound N/A 12/13/2013    Procedure: VIDEO BRONCHOSCOPY WITH ENDOBRONCHIAL ULTRASOUND;  Surgeon: Grace Isaac, MD;  Location: [facility];  Service: Thoracic;  Laterality: N/A;  . Portacath placement Left 12/13/2013    Procedure: INSERTION PORT-A-CATH;  Surgeon: Grace Isaac, MD;  Location: [facility];  Service: Thoracic;  Laterality: Left;    REVIEW OF SYSTEMS:  Constitutional: positive for fatigue Eyes: negative Ears, nose, mouth, throat, and face: positive for sore mouth Respiratory: positive for dyspnea on exertion Cardiovascular: negative Gastrointestinal: negative Genitourinary:negative Integument/breast: negative Hematologic/lymphatic:  negative Musculoskeletal:positive for achiness in left shoulder after Neulasta injection Neurological: negative Behavioral/Psych: negative Endocrine: negative Allergic/Immunologic: negative   PHYSICAL EXAMINATION: General appearance: alert, cooperative, appears stated age and no distress Head: Normocephalic, without obvious abnormality, atraumatic Neck: no adenopathy, no carotid bruit, no JVD, supple, symmetrical, trachea midline and thyroid not enlarged, symmetric, no tenderness/mass/nodules Lymph nodes: Cervical, supraclavicular, and axillary nodes normal. Resp: clear to auscultation bilaterally Back: symmetric, no curvature. ROM normal. No CVA tenderness. Cardio: regular rate and rhythm, S1, S2 normal, no murmur, click, rub or gallop GI: soft, non-tender; bowel sounds normal; no masses,  no organomegaly Extremities: extremities normal, atraumatic, no cyanosis or edema Neurologic: Alert and oriented X 3, normal strength and tone. Normal symmetric reflexes. Normal coordination and gait  ECOG PERFORMANCE STATUS: 1 - Symptomatic but completely ambulatory  Blood pressure 132/65, pulse 111, temperature 98.4 F (36.9 C), temperature source Oral, resp. rate 19, height 5\' 6"  (1.676 m), weight 177 lb 4.8 oz (80.423 kg).  LABORATORY DATA: Lab  Results  Component Value Date   WBC 1.9* 01/08/2014   HGB 12.3 01/08/2014   HCT 38.4 01/08/2014   MCV 89.7 01/08/2014   PLT 134* 01/08/2014      Chemistry      Component Value Date/Time   NA 139 01/08/2014 1116   NA 140 12/19/2013 0320   K 4.7 01/08/2014 1116   K 4.3 12/19/2013 0320   CL 100 12/19/2013 0320   CO2 22 01/08/2014 1116   CO2 28 12/19/2013 0320   BUN 20.9 01/08/2014 1116   BUN 21 12/19/2013 0320   CREATININE 0.8 01/08/2014 1116   CREATININE 0.78 12/19/2013 0320      Component Value Date/Time   CALCIUM 10.0 01/08/2014 1116   CALCIUM 9.7 12/19/2013 0320   ALKPHOS 60 01/08/2014 1116   ALKPHOS 54 12/19/2013 0320   AST 18 01/08/2014 1116   AST 24 12/19/2013 0320   ALT 60* 01/08/2014 1116   ALT 48* 12/19/2013 0320   BILITOT 0.50 01/08/2014 1116   BILITOT <0.2* 12/19/2013 0320       RADIOGRAPHIC STUDIES:  Dg Chest 2 View  12/13/2013   CLINICAL DATA:  Status post bronchoscopy  EXAM: CHEST  2 VIEW  COMPARISON:  12/13/2013  FINDINGS: Cardiac shadow is within normal limits. A new left-sided chest wall port is seen with the catheter tip in the mid to distal superior vena cava. No pneumothorax is noted. Persistent left hilar/ mediastinal mass lesion is seen. There is decreased inspiratory effort with atelectatic changes in the left lung likely related to obstructive change from the known mass lesion. The right lung is clear.  IMPRESSION: No pneumothorax following port placement.  Poor aeration of the left lung likely related to central compression and poor inspiratory effort.   Electronically Signed   By: Inez Catalina M.D.   On: 12/13/2013 11:28   Chest 2 View  12/13/2013   CLINICAL DATA:  Preoperative  EXAM: CHEST  2 VIEW  COMPARISON:  Prior CT from 12/09/2013  FINDINGS: Abnormal convexity along the left mediastinal border overlying the region of the U aortic knob is compatible with known left hilar/mediastinal mass within this region. Mild cardiomegaly is stable.  Lungs are otherwise clear  without focal infiltrate, pulmonary edema, or pleural effusion. No pneumothorax.  No acute osseous abnormality.  IMPRESSION: 1. Stable appearance of left hilar/ mediastinal mass as compared to recent CT from 12/09/2013. 2. No other acute cardiopulmonary abnormality identified.   Electronically Signed   By: Marland Kitchen  Jeannine Boga M.D.   On: 12/13/2013 06:19   Ct Angio Chest Pe W/cm &/or Wo Cm  12/17/2013   CLINICAL DATA:  Chest pain and shortness of breath.  Lung cancer.  EXAM: CT ANGIOGRAPHY CHEST WITH CONTRAST  TECHNIQUE: Multidetector CT imaging of the chest was performed using the standard protocol during bolus administration of intravenous contrast. Multiplanar CT image reconstructions and MIPs were obtained to evaluate the vascular anatomy.  CONTRAST:  150mL OMNIPAQUE IOHEXOL 350 MG/ML SOLN  COMPARISON:  CT chest 12/09/2013.  FINDINGS: Re-demonstrated is a large mass centered at the left hilum and invading the adjacent mediastinum including AP window. Pericardial invasion is present. 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 Small left lower lobe branches noted previously no longer fill, likely due to thrombosis. No developing left lung infarction. Slight increase left pleural effusion could be malignant or reactive. Pericardial invasion. Slight decrease pericardial effusion. No acute PE on the right. Unchanged spiculated nodule left upper lobe. No right lung infiltrates. Stable mediastinal adenopathy.  Stable left adrenal mass. Hepatic steatosis. Normal aorta. No destructive osseous lesions.  Review of the MIP images confirms the above findings.  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 No developing left lung infarction. Slight increase left pleural effusion. Pericardial invasion with Stable mediastinal adenopathy.   Electronically Signed   By: Rolla Flatten M.D.    On: 12/17/2013 08:14   Ct Angio Chest Pe W/cm &/or Wo Cm  12/09/2013   CLINICAL DATA:  Chest pain and severe cough. Pulmonary embolism in 1980. Ex-smoker.  BUN and creatinine were obtained on site at [facility] at  [address]  Results:  BUN 14 mg/dL,  Creatinine 0.6 mg/dL.  EXAM: CT ANGIOGRAPHY CHEST WITH CONTRAST  TECHNIQUE: Multidetector CT imaging of the chest was performed using the standard protocol during bolus administration of intravenous contrast. Multiplanar CT image reconstructions and MIPs were obtained to evaluate the vascular anatomy.  CONTRAST:  179mL OMNIPAQUE IOHEXOL 350 MG/ML SOLN  COMPARISON:  Chest radiographs dated 02/03/2009 and chest CT dated 05/20/2007.  FINDINGS: Large mass centered at the left hilum and invading the adjacent mediastinum, including the AP window. This mass measures 8.9 x 5.6 cm on image number 44 of series 4 and 6.8 cm in length on image number 50 of series 600. This is compressing and causing almost complete occlusion of the distal left main pulmonary artery with lack of opacification of the majority of the left pulmonary artery branches.  There is also a new spiculated nodule in the left upper lobe measuring 1.2 cm in maximum diameter on image number 27 of series 5 which is also a new. There is also a new 4 mm subpleural nodule in the left upper lobe on image number 27 of series 5.  Also noted are multiple enlarged mediastinal lymph nodes. The majority of these are in the AP window, including a node with a short axis diameter of 13.5 mm on image number 45. A proximal right hilar node has a short axis diameter of 10 mm on image number 44.  There is an interval oval, mass-like area of in the pericardium on the left, measuring 2.8 x 1.0 cm on image number 69. There is also a small pericardial effusion with a maximum thickness of 7 mm.  There are multiple areas of patchy density in the left upper lobe, lingula  and left lower lobe. There is also an interval  3 mm subpleural nodule in the left lower lobe on image number 89, at the inferior aspect of somewhat nodular patchy opacity.  A small left pleural effusion is noted. On the last image, there is a suggestion of a partially imaged left adrenal mass, measuring 1.3 x 0.9 cm. The more inferior portion of the left adrenal gland is not included and the majority of the right adrenal gland is not included.  The remainder of the lungs are mildly hyperexpanded with mildly prominent interstitial markings. Diffuse peribronchial thickening is also noted.  The pulmonary arteries on the right are normally opacified with no pulmonary emboli seen.  Mild diffuse low density of the liver is noted in the upper abdomen. Thoracic spine degenerative changes are noted.  Review of the MIP images confirms the above findings.  IMPRESSION: 1. 8.9 x 6.8 x 5.6 cm left hilar and mediastinal mass causing almost complete occlusion of the distal left main pulmonary artery with lack of opacification of the majority of the pulmonary arteries on the left. 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 2. Mediastinal and right hilar metastatic adenopathy. 3. Possible left adrenal metastasis. 4. 1.2 cm spiculated left upper lobe nodule, concerning for a primary lung carcinoma. 5. 4 mm left upper lobe subpleural nodules suspicious for a metastasis. 6. Possible left pericardial metastasis measuring 2.8 x 1.0 cm. 7. Patchy opacities most likely representing areas of pulmonary infarction in the left upper lobe, lingula and left lower lobe. An area in the left lower lobe has nodular components. 8. Small left pleural effusion. 9. Small pericardial effusion. 10. COPD. 11. Mild diffuse hepatic steatosis.  These results were called by telephone at the time of interpretation on 12/09/2013 at 12:56 PM to Dr. Antony Contras , who verbally acknowledged these results.   Electronically Signed   By: Enrique Sack M.D.   On:  12/09/2013 12:30   Mr Jeri Cos NW Contrast  12/18/2013   CLINICAL DATA:  Small cell lung cancer, evaluate for brain metastasis.  EXAM: MRI HEAD WITHOUT AND WITH CONTRAST  TECHNIQUE: Multiplanar, multiecho pulse sequences of the brain and surrounding structures were obtained without and with intravenous contrast.  CONTRAST:  76mL MULTIHANCE GADOBENATE DIMEGLUMINE 529 MG/ML IV SOLN  COMPARISON:  CT HEAD WO/W CM dated 05/19/2005  FINDINGS: At least 7 infratentorial mildly enhancing lesions with slight reduced diffusion consistent with hypercellularity consistent with metastasis, largest in the left cerebellum measuring 8 x 9 mm.  At least 19 supratentorial enhancing hypermetabolic metastasis seen, with surrounding bright FLAIR T2 hyperintense vasogenic edema, including a 8 x 9 mm lesion in right temporal lobe, tiny lesions in the right posterior frontal lobe, motor cortex. 3-4 mm enhancing lesions in the caudate bilaterally. No midline shift or mass effect. Tiny foci of susceptibility artifact associated with a few of the metastasis in the supratentorial brain. A few of the metastasis shows central hypo enhancement consistent with cystic necrosis. No reduced diffusion to suggest acute ischemia. The ventricles and sulci are overall normal for patient's age.  Additional patchy FLAIR T2 hyperintense signal in the pons, with scattered subcentimeter supratentorial T2 hyperintensity suggesting sequelae of chronic small vessel ischemic disease. Right occipital developmental venous anomaly, benign finding.  No abnormal extra-axial fluid collections. Though many of the intracranial metastasis are within the cortex/gray-white matter junction, no convincing evidence of dural or leptomeningeal metastatic disease. Normal major intracranial vascular flow voids seen  at the skull base.  Ocular globes and orbital contents are unremarkable. No paranasal sinus air-fluid levels. Mastoid air cells appear well-aerated. Of note, there is  mild deformity of the left dorsal spinal cord due to apparent C1-2 arthropathy. No abnormal sellar expansion. No cerebellar tonsillar ectopia.  IMPRESSION: At least 7 infratentorial and 19 supratentorial subcentimeter enhancing intraparenchymal lesions consistent with metastatic disease, some of which are slightly hemorrhagic and/or cystic. Associated mild vasogenic edema without significant mass effect.  Mild-to-moderate additional white matter changes suggest sequelae of chronic small vessel ischemic disease, less likely nonenhancing metastatic disease.   Electronically Signed   By: Elon Alas   On: 12/18/2013 00:23   Dg Fluoro Guide Cv Line-no Report  12/13/2013   CLINICAL DATA: inserting port   FLOURO GUIDE CV LINE  Fluoroscopy was utilized by the requesting physician.  No radiographic  interpretation.    Mm Screening Breast Tomo Bilateral  12/05/2013   CLINICAL DATA:  Screening.  EXAM: DIGITAL SCREENING BILATERAL MAMMOGRAM WITH 3D TOMO WITH CAD  COMPARISON:  Previous exam(s).  ACR Breast Density Category b: There are scattered areas of fibroglandular density.  FINDINGS: There are no findings suspicious for malignancy. Images were processed with CAD.  IMPRESSION: No mammographic evidence of malignancy. A result letter of this screening mammogram will be mailed directly to the patient.  RECOMMENDATION: Screening mammogram in one year. (Code:SM-B-01Y)  BI-RADS CATEGORY  1: Negative.   Electronically Signed   By: Lovey Newcomer M.D.   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 She is status post whole brain irradiation under the care of Dr. Sondra Come completed on 12/31/2013. The patient was discussed with also seen by Dr. Julien Nordmann. She is status post her first cycle of systemic chemotherapy with carboplatin for an AUC of 5 given on day 1 and etoposide at 120  mg per meter squared given on days 1, 2 and 3 with Neulasta given on day 4 on 01/01/2014. Prostate tolerated this first cycle relatively well with the exception of some weakness for a few days as well some achiness in her shoulder after the Neulasta injection. She would like to return to work and paperwork was completed for her office. She will continue with weekly labs as scheduled and return in 2 weeks for the start of cycle #2. She will be due a restaging CT scan after the completion of cycle #2. The patient and her family members voiced understanding. A refill for her Magic mouthwash was sent her pharmacy of record via E. scribed.     Carlton Adam, PA-C All questions were answered. The patient knows to call the clinic with any problems, questions or concerns. We can certainly see the patient much sooner if necessary.  ADDENDUM: Hematology/Oncology Attending: I had a face to face encounter with the patient. I recommended for her care plan. This is a very pleasant 58 years old white female recently diagnosed with extensive stage small cell lung cancer recently completed whole brain irradiation and currently undergoing systemic chemotherapy with carboplatin and etoposide status post 1 cycle, started last week. The patient tolerated the first week of her systemic chemotherapy fairly well with no significant adverse effects. I recommended for the patient to continue her current treatment with systemic chemotherapy as scheduled. She would come back for follow up visit in 2 weeks with the start of cycle #2. She  was advised to call immediately if she has any concerning symptoms in the interval.  Disclaimer: This note was dictated with voice recognition software. Similar sounding words can inadvertently be transcribed and may not be corrected upon review. Curt Bears, MD 01/14/2014

## 2014-01-08 NOTE — Telephone Encounter (Signed)
gv adn printed appt sched and avs for  pt for May adn June....Marland Kitchensed added tx.

## 2014-01-09 ENCOUNTER — Encounter: Payer: Self-pay | Admitting: Radiation Oncology

## 2014-01-09 NOTE — Progress Notes (Signed)
  Radiation Oncology         [phone] ________________________________  Name: ZUHA DEJONGE MRN: [id number]  Date: 01/09/2014  DOB: [birth year]  End of Treatment Note  Diagnosis:   Lung cancer   Primary site: Lung (Left)   Staging method: AJCC 7th Edition   Clinical free text: Extensive stage small cell lung cancer   Clinical: Stage IV (T3, N2, M1b)    Summary: Stage IV (T3, N2, M1b)      Indication for treatment:  Brain metastasis and breathing difficulties       Radiation treatment dates:   April 22 through May 5  Site/dose:   Whole brain 30 gray in 10 fractions                      Left central chest 30 gray in 10 fractions  Beams/energy:   Lateral fields encompassing the whole brain, 3-D conformal treating the left lung mass - 5 fields  Narrative: The patient tolerated radiation treatment relatively well.  The patient's breathing and chest pain improved.  She did develop a candida infection was placed on Diflucan for this issue. She was given a Decadron taper instructions  Plan: The patient has completed radiation treatment. The patient will return to radiation oncology clinic for routine followup in one month. I advised them to call or return sooner if they have any questions or concerns related to their recovery or treatment. She will proceed with systemic chemotherapy  -----------------------------------  Blair Promise, PhD, MD

## 2014-01-13 NOTE — Patient Instructions (Signed)
Continue labs as scheduled Followup in 2 weeks at the start of your next cycle of chemotherapy He may return to work for half to three-quarter days or as tolerated.

## 2014-01-15 ENCOUNTER — Other Ambulatory Visit (HOSPITAL_BASED_OUTPATIENT_CLINIC_OR_DEPARTMENT_OTHER): Payer: 59

## 2014-01-15 DIAGNOSIS — C3490 Malignant neoplasm of unspecified part of unspecified bronchus or lung: Secondary | ICD-10-CM

## 2014-01-15 DIAGNOSIS — C7949 Secondary malignant neoplasm of other parts of nervous system: Secondary | ICD-10-CM

## 2014-01-15 DIAGNOSIS — C7931 Secondary malignant neoplasm of brain: Secondary | ICD-10-CM

## 2014-01-15 LAB — COMPREHENSIVE METABOLIC PANEL (CC13)
ALK PHOS: 82 U/L (ref 40–150)
ALT: 90 U/L — AB (ref 0–55)
ANION GAP: 16 meq/L — AB (ref 3–11)
AST: 27 U/L (ref 5–34)
Albumin: 3.9 g/dL (ref 3.5–5.0)
BUN: 20 mg/dL (ref 7.0–26.0)
CO2: 23 meq/L (ref 22–29)
Calcium: 9.8 mg/dL (ref 8.4–10.4)
Chloride: 103 mEq/L (ref 98–109)
Creatinine: 0.8 mg/dL (ref 0.6–1.1)
Glucose: 288 mg/dl — ABNORMAL HIGH (ref 70–140)
Potassium: 4.2 mEq/L (ref 3.5–5.1)
SODIUM: 142 meq/L (ref 136–145)
TOTAL PROTEIN: 7.1 g/dL (ref 6.4–8.3)
Total Bilirubin: 0.3 mg/dL (ref 0.20–1.20)

## 2014-01-15 LAB — CBC WITH DIFFERENTIAL/PLATELET
BASO%: 0.5 % (ref 0.0–2.0)
Basophils Absolute: 0 10*3/uL (ref 0.0–0.1)
EOS%: 0.1 % (ref 0.0–7.0)
Eosinophils Absolute: 0 10*3/uL (ref 0.0–0.5)
HEMATOCRIT: 36.8 % (ref 34.8–46.6)
HGB: 12.1 g/dL (ref 11.6–15.9)
LYMPH%: 8 % — ABNORMAL LOW (ref 14.0–49.7)
MCH: 29.4 pg (ref 25.1–34.0)
MCHC: 32.8 g/dL (ref 31.5–36.0)
MCV: 89.6 fL (ref 79.5–101.0)
MONO#: 0.4 10*3/uL (ref 0.1–0.9)
MONO%: 5 % (ref 0.0–14.0)
NEUT#: 7.4 10*3/uL — ABNORMAL HIGH (ref 1.5–6.5)
NEUT%: 86.4 % — AB (ref 38.4–76.8)
RBC: 4.1 10*6/uL (ref 3.70–5.45)
RDW: 16 % — ABNORMAL HIGH (ref 11.2–14.5)
WBC: 8.6 10*3/uL (ref 3.9–10.3)
lymph#: 0.7 10*3/uL — ABNORMAL LOW (ref 0.9–3.3)

## 2014-01-22 ENCOUNTER — Other Ambulatory Visit: Payer: Self-pay | Admitting: Internal Medicine

## 2014-01-22 ENCOUNTER — Other Ambulatory Visit (HOSPITAL_BASED_OUTPATIENT_CLINIC_OR_DEPARTMENT_OTHER): Payer: 59

## 2014-01-22 ENCOUNTER — Ambulatory Visit (HOSPITAL_BASED_OUTPATIENT_CLINIC_OR_DEPARTMENT_OTHER): Payer: 59

## 2014-01-22 VITALS — BP 135/70 | HR 106 | Temp 98.6°F | Resp 18

## 2014-01-22 DIAGNOSIS — C3410 Malignant neoplasm of upper lobe, unspecified bronchus or lung: Secondary | ICD-10-CM

## 2014-01-22 DIAGNOSIS — C7949 Secondary malignant neoplasm of other parts of nervous system: Secondary | ICD-10-CM

## 2014-01-22 DIAGNOSIS — C7931 Secondary malignant neoplasm of brain: Secondary | ICD-10-CM

## 2014-01-22 DIAGNOSIS — C3490 Malignant neoplasm of unspecified part of unspecified bronchus or lung: Secondary | ICD-10-CM

## 2014-01-22 DIAGNOSIS — Z5111 Encounter for antineoplastic chemotherapy: Secondary | ICD-10-CM

## 2014-01-22 LAB — COMPREHENSIVE METABOLIC PANEL (CC13)
ALT: 62 U/L — ABNORMAL HIGH (ref 0–55)
AST: 20 U/L (ref 5–34)
Albumin: 3.9 g/dL (ref 3.5–5.0)
Alkaline Phosphatase: 73 U/L (ref 40–150)
Anion Gap: 15 mEq/L — ABNORMAL HIGH (ref 3–11)
BILIRUBIN TOTAL: 0.32 mg/dL (ref 0.20–1.20)
BUN: 21.9 mg/dL (ref 7.0–26.0)
CALCIUM: 9.5 mg/dL (ref 8.4–10.4)
CHLORIDE: 105 meq/L (ref 98–109)
CO2: 22 meq/L (ref 22–29)
CREATININE: 0.8 mg/dL (ref 0.6–1.1)
Glucose: 356 mg/dl — ABNORMAL HIGH (ref 70–140)
Potassium: 4.3 mEq/L (ref 3.5–5.1)
Sodium: 142 mEq/L (ref 136–145)
Total Protein: 6.9 g/dL (ref 6.4–8.3)

## 2014-01-22 LAB — CBC WITH DIFFERENTIAL/PLATELET
BASO%: 1.3 % (ref 0.0–2.0)
BASOS ABS: 0.1 10*3/uL (ref 0.0–0.1)
EOS%: 0.1 % (ref 0.0–7.0)
Eosinophils Absolute: 0 10*3/uL (ref 0.0–0.5)
HCT: 34.6 % — ABNORMAL LOW (ref 34.8–46.6)
HEMOGLOBIN: 11.3 g/dL — AB (ref 11.6–15.9)
LYMPH#: 1.2 10*3/uL (ref 0.9–3.3)
LYMPH%: 11.9 % — AB (ref 14.0–49.7)
MCH: 29.5 pg (ref 25.1–34.0)
MCHC: 32.7 g/dL (ref 31.5–36.0)
MCV: 90.2 fL (ref 79.5–101.0)
MONO#: 0.7 10*3/uL (ref 0.1–0.9)
MONO%: 7.1 % (ref 0.0–14.0)
NEUT#: 8.1 10*3/uL — ABNORMAL HIGH (ref 1.5–6.5)
NEUT%: 79.6 % — ABNORMAL HIGH (ref 38.4–76.8)
Platelets: 233 10*3/uL (ref 145–400)
RBC: 3.84 10*6/uL (ref 3.70–5.45)
RDW: 16.1 % — ABNORMAL HIGH (ref 11.2–14.5)
WBC: 10.2 10*3/uL (ref 3.9–10.3)

## 2014-01-22 MED ORDER — ONDANSETRON 16 MG/50ML IVPB (CHCC)
INTRAVENOUS | Status: AC
  Filled 2014-01-22: qty 16

## 2014-01-22 MED ORDER — HEPARIN SOD (PORK) LOCK FLUSH 100 UNIT/ML IV SOLN
500.0000 [IU] | Freq: Once | INTRAVENOUS | Status: AC | PRN
  Administered 2014-01-22: 500 [IU]
  Filled 2014-01-22: qty 5

## 2014-01-22 MED ORDER — ONDANSETRON 16 MG/50ML IVPB (CHCC)
16.0000 mg | Freq: Once | INTRAVENOUS | Status: AC
  Administered 2014-01-22: 16 mg via INTRAVENOUS

## 2014-01-22 MED ORDER — DEXAMETHASONE SODIUM PHOSPHATE 20 MG/5ML IJ SOLN
20.0000 mg | Freq: Once | INTRAMUSCULAR | Status: AC
  Administered 2014-01-22: 20 mg via INTRAVENOUS

## 2014-01-22 MED ORDER — SODIUM CHLORIDE 0.9 % IV SOLN
Freq: Once | INTRAVENOUS | Status: AC
  Administered 2014-01-22: 14:00:00 via INTRAVENOUS

## 2014-01-22 MED ORDER — DEXAMETHASONE SODIUM PHOSPHATE 20 MG/5ML IJ SOLN
INTRAMUSCULAR | Status: AC
  Filled 2014-01-22: qty 5

## 2014-01-22 MED ORDER — SODIUM CHLORIDE 0.9 % IJ SOLN
10.0000 mL | INTRAMUSCULAR | Status: DC | PRN
  Administered 2014-01-22: 10 mL
  Filled 2014-01-22: qty 10

## 2014-01-22 MED ORDER — SODIUM CHLORIDE 0.9 % IV SOLN
650.0000 mg | Freq: Once | INTRAVENOUS | Status: AC
  Administered 2014-01-22: 650 mg via INTRAVENOUS
  Filled 2014-01-22: qty 65

## 2014-01-22 MED ORDER — SODIUM CHLORIDE 0.9 % IV SOLN
120.0000 mg/m2 | Freq: Once | INTRAVENOUS | Status: AC
  Administered 2014-01-22: 240 mg via INTRAVENOUS
  Filled 2014-01-22: qty 12

## 2014-01-22 NOTE — Patient Instructions (Signed)
[facility] Discharge Instructions for Patients Receiving Chemotherapy  Today you received the following chemotherapy agents: Carboplatin, Etoposide   To help prevent nausea and vomiting after your treatment, we encourage you to take your nausea medication as prescribed.    If you develop nausea and vomiting that is not controlled by your nausea medication, call the clinic.   BELOW ARE SYMPTOMS THAT SHOULD BE REPORTED IMMEDIATELY:  *FEVER GREATER THAN 100.5 F  *CHILLS WITH OR WITHOUT FEVER  NAUSEA AND VOMITING THAT IS NOT CONTROLLED WITH YOUR NAUSEA MEDICATION  *UNUSUAL SHORTNESS OF BREATH  *UNUSUAL BRUISING OR BLEEDING  TENDERNESS IN MOUTH AND THROAT WITH OR WITHOUT PRESENCE OF ULCERS  *URINARY PROBLEMS  *BOWEL PROBLEMS  UNUSUAL RASH Items with * indicate a potential emergency and should be followed up as soon as possible.  Feel free to call the clinic you have any questions or concerns. The clinic phone number is [phone].

## 2014-01-23 ENCOUNTER — Telehealth: Payer: Self-pay | Admitting: Internal Medicine

## 2014-01-23 ENCOUNTER — Encounter: Payer: Self-pay | Admitting: Physician Assistant

## 2014-01-23 ENCOUNTER — Ambulatory Visit (HOSPITAL_BASED_OUTPATIENT_CLINIC_OR_DEPARTMENT_OTHER): Payer: 59 | Admitting: Physician Assistant

## 2014-01-23 ENCOUNTER — Ambulatory Visit (HOSPITAL_BASED_OUTPATIENT_CLINIC_OR_DEPARTMENT_OTHER): Payer: 59

## 2014-01-23 ENCOUNTER — Ambulatory Visit: Payer: 59

## 2014-01-23 ENCOUNTER — Encounter: Payer: Self-pay | Admitting: Oncology

## 2014-01-23 VITALS — BP 125/73 | HR 111 | Temp 99.0°F | Resp 18 | Ht 66.0 in | Wt 182.2 lb

## 2014-01-23 DIAGNOSIS — C3490 Malignant neoplasm of unspecified part of unspecified bronchus or lung: Secondary | ICD-10-CM

## 2014-01-23 DIAGNOSIS — C7931 Secondary malignant neoplasm of brain: Secondary | ICD-10-CM

## 2014-01-23 DIAGNOSIS — R5381 Other malaise: Secondary | ICD-10-CM

## 2014-01-23 DIAGNOSIS — C7949 Secondary malignant neoplasm of other parts of nervous system: Secondary | ICD-10-CM

## 2014-01-23 DIAGNOSIS — R5383 Other fatigue: Secondary | ICD-10-CM

## 2014-01-23 DIAGNOSIS — L659 Nonscarring hair loss, unspecified: Secondary | ICD-10-CM

## 2014-01-23 DIAGNOSIS — C3410 Malignant neoplasm of upper lobe, unspecified bronchus or lung: Secondary | ICD-10-CM

## 2014-01-23 DIAGNOSIS — Z5111 Encounter for antineoplastic chemotherapy: Secondary | ICD-10-CM

## 2014-01-23 MED ORDER — ONDANSETRON 8 MG/NS 50 ML IVPB
INTRAVENOUS | Status: AC
  Filled 2014-01-23: qty 8

## 2014-01-23 MED ORDER — DEXAMETHASONE SODIUM PHOSPHATE 10 MG/ML IJ SOLN
10.0000 mg | Freq: Once | INTRAMUSCULAR | Status: AC
  Administered 2014-01-23: 10 mg via INTRAVENOUS

## 2014-01-23 MED ORDER — ONDANSETRON 8 MG/50ML IVPB (CHCC)
8.0000 mg | Freq: Once | INTRAVENOUS | Status: AC
  Administered 2014-01-23: 8 mg via INTRAVENOUS

## 2014-01-23 MED ORDER — SODIUM CHLORIDE 0.9 % IV SOLN
Freq: Once | INTRAVENOUS | Status: AC
  Administered 2014-01-23: 16:00:00 via INTRAVENOUS

## 2014-01-23 MED ORDER — HEPARIN SOD (PORK) LOCK FLUSH 100 UNIT/ML IV SOLN
500.0000 [IU] | Freq: Once | INTRAVENOUS | Status: AC | PRN
  Administered 2014-01-23: 500 [IU]
  Filled 2014-01-23: qty 5

## 2014-01-23 MED ORDER — SODIUM CHLORIDE 0.9 % IJ SOLN
10.0000 mL | INTRAMUSCULAR | Status: DC | PRN
  Administered 2014-01-23: 10 mL
  Filled 2014-01-23: qty 10

## 2014-01-23 MED ORDER — DEXAMETHASONE SODIUM PHOSPHATE 10 MG/ML IJ SOLN
INTRAMUSCULAR | Status: AC
  Filled 2014-01-23: qty 1

## 2014-01-23 MED ORDER — SODIUM CHLORIDE 0.9 % IV SOLN
120.0000 mg/m2 | Freq: Once | INTRAVENOUS | Status: AC
  Administered 2014-01-23: 240 mg via INTRAVENOUS
  Filled 2014-01-23: qty 12

## 2014-01-23 NOTE — Patient Instructions (Signed)
[facility] Discharge Instructions for Patients Receiving Chemotherapy  Today you received the following chemotherapy agent: Etoposide   To help prevent nausea and vomiting after your treatment, we encourage you to take your nausea medication as prescribed.    If you develop nausea and vomiting that is not controlled by your nausea medication, call the clinic.   BELOW ARE SYMPTOMS THAT SHOULD BE REPORTED IMMEDIATELY:  *FEVER GREATER THAN 100.5 F  *CHILLS WITH OR WITHOUT FEVER  NAUSEA AND VOMITING THAT IS NOT CONTROLLED WITH YOUR NAUSEA MEDICATION  *UNUSUAL SHORTNESS OF BREATH  *UNUSUAL BRUISING OR BLEEDING  TENDERNESS IN MOUTH AND THROAT WITH OR WITHOUT PRESENCE OF ULCERS  *URINARY PROBLEMS  *BOWEL PROBLEMS  UNUSUAL RASH Items with * indicate a potential emergency and should be followed up as soon as possible.  Feel free to call the clinic you have any questions or concerns. The clinic phone number is [phone].

## 2014-01-23 NOTE — Progress Notes (Signed)
No images are attached to the encounter. No scans are attached to the encounter. No scans are attached to the encounter. Carol Bond VISIT PROGRESS NOTE  [address] [place] [address]  DIAGNOSIS: Lung cancer   Primary site: Lung (Left)   Staging method: AJCC 7th Edition   Clinical free text: Extensive stage small cell lung cancer   Clinical: Stage IV (T3, N2, M1b) signed by Curt Bears, MD on 12/17/2013  7:45 PM   Summary: Stage IV (T3, N2, M1b)  PRIOR THERAPY:Whole brain irradiation under the care of Dr. Sondra Come  CURRENT THERAPY:  Systemic chemotherapy with carboplatin for an AUC of 5 given on day 1, etoposide at 120 mg per meter square given on days 1, 2 and 3 with Neulasta support given on day 4. Systemic chemotherapy on hold until patient completes whole brain irradiation. Cycle #1  given starting on 01/01/2014  DISEASE STAGE: Lung cancer   Primary site: Lung (Left)   Staging method: AJCC 7th Edition   Clinical free text: Extensive stage small cell lung cancer   Clinical: Stage IV (T3, N2, M1b) signed by Curt Bears, MD on 12/17/2013  7:45 PM   Summary: Stage IV (T3, N2, M1b)  CHEMOTHERAPY INTENT: Palliative  CURRENT # OF CHEMOTHERAPY CYCLES: 2  CURRENT ANTIEMETICS: none  CURRENT SMOKING STATUS: Former smoker, quit July 2014  ORAL CHEMOTHERAPY AND CONSENT: n/a  CURRENT BISPHOSPHONATES USE: none  PAIN MANAGEMENT: oxycodone  NARCOTICS INDUCED CONSTIPATION:  none  LIVING WILL AND CODE STATUS:    INTERVAL HISTORY: Carol Bond 58 y.o. female returns for a symptom management visit for followup of her recently diagnosed extensive stage small cell lung cancer. She completed whole brain radiation under the care Dr. Sondra Come on 12/31/2013. She status post 1 cycle of systemic chemotherapy with carboplatin and etoposide with Neulasta support. Overall she tolerated her first cycle of chemotherapy relatively well  with the exception of some generalized weakness and some achiness in her shoulders after the Neulasta injection. Her appetite remains good and she denied any fever, chills, diarrhea or constipation. No nausea or vomiting. She has some occasional episodes of blurred vision. She is currently on 2 mg of dexamethasone every other day and will finish this dexamethasone taper within the next week. She complains of redness and irritation behind her ears on her scalp and is noticing some skin breakdown on her anterior chest. The Magic mouthwash helped her mouth ulcers and thrush and all of this has resolved. Her swallowing has improved and he no longer hurts her to eat or drink. She has return to work and is able to finish a 6 hour day with minimal fatigue.  She denied any fever, chills, nausea, vomiting, diarrhea or constipation. She voiced no other specific complaints other than those listed above.  MEDICAL HISTORY: Past Medical History  Diagnosis Date  . Pulmonary embolism   . Hypothyroid   . Diabetes   . Cancer     Lung    ALLERGIES:  is allergic to actos and vicodin.  MEDICATIONS:  Current Outpatient Prescriptions  Medication Sig Dispense Refill  . albuterol (PROVENTIL HFA;VENTOLIN HFA) 108 (90 BASE) MCG/ACT inhaler Inhale 2 puffs into the lungs every 6 (six) hours as needed for wheezing or shortness of breath.  1 Inhaler  2  . Alum & Mag Hydroxide-Simeth (MAGIC MOUTHWASH) SOLN Take 5 mLs by mouth 4 (four) times daily as needed for mouth pain.  240 mL  0  . amoxicillin (AMOXIL) 500 MG tablet       . aspirin 81 MG tablet Take 162 mg by mouth once.      Marland Kitchen atorvastatin (LIPITOR) 40 MG tablet Take 40 mg by mouth daily.      . betamethasone dipropionate (DIPROLENE) 0.05 % cream Apply 1 application topically daily as needed.      . Canagliflozin (INVOKANA) 300 MG TABS Take 1 tablet by mouth daily before breakfast.      . dexamethasone (DECADRON) 4 MG tablet Take 1 tablet (4 mg total) by mouth every 8  (eight) hours.  60 tablet  0  . emollient (BIAFINE) cream Apply topically 2 (two) times daily.      Marland Kitchen enoxaparin (LOVENOX) 40 MG/0.4ML injection Inject 0.4 mLs (40 mg total) into the skin daily.  30 Syringe  1  . escitalopram (LEXAPRO) 10 MG tablet Take 10 mg by mouth daily.      Marland Kitchen esomeprazole (NEXIUM) 40 MG capsule Take 40 mg by mouth daily at 12 noon.      . fenofibrate 160 MG tablet Take 160 mg by mouth daily.      Marland Kitchen glimepiride (AMARYL) 4 MG tablet Take 4 mg by mouth daily with breakfast.      . insulin glargine (LANTUS) 100 unit/mL SOPN Inject 0.05 mLs (5 Units total) into the skin at bedtime. Solostar Pen  15 mL  11  . levothyroxine (SYNTHROID, LEVOTHROID) 150 MCG tablet Take 150 mcg by mouth daily before breakfast.      . lidocaine-prilocaine (EMLA) cream Apply 1 application topically as needed. Apply to port a cath site 90 minutes prior to chemotherapy appointment.  30 g  0  . loratadine (CLARITIN) 10 MG tablet Take 10 mg by mouth daily.      Marland Kitchen LOSARTAN POTASSIUM PO Take 50 mg by mouth daily.      Marland Kitchen neomycin-polymyxin-hydrocortisone (CORTISPORIN) 3.5-10000-1 otic suspension       . Oxycodone HCl 10 MG TABS Take 1 tablet (10 mg total) by mouth every 8 (eight) hours as needed (Pain).  60 tablet  0  . prochlorperazine (COMPAZINE) 10 MG tablet Take 1 tablet (10 mg total) by mouth every 6 (six) hours as needed for nausea or vomiting.  30 tablet  2  . sitaGLIPtin-metformin (JANUMET) 50-1000 MG per tablet Take 1 tablet by mouth 2 (two) times daily with a meal.      . triamcinolone (NASACORT AQ) 55 MCG/ACT AERO nasal inhaler Place 1 spray into the nose daily.      . beclomethasone (QVAR) 80 MCG/ACT inhaler Inhale 2 puffs into the lungs at bedtime.      . chlorpheniramine-HYDROcodone (TUSSIONEX) 10-8 MG/5ML LQCR Take 5 mLs by mouth every 12 (twelve) hours as needed for cough.      . fexofenadine (ALLEGRA) 30 MG tablet Take 30 mg by mouth 2 (two) times daily.       No current  facility-administered medications for this visit.    SURGICAL HISTORY:  Past Surgical History  Procedure Laterality Date  . Abdominal hysterectomy    . Spine surgery    . Cervical fusion    . Video bronchoscopy with endobronchial ultrasound N/A 12/13/2013    Procedure: VIDEO BRONCHOSCOPY WITH ENDOBRONCHIAL ULTRASOUND;  Surgeon: Grace Isaac, MD;  Location: [facility];  Service: Thoracic;  Laterality: N/A;  . Portacath placement Left 12/13/2013    Procedure: INSERTION PORT-A-CATH;  Surgeon: Grace Isaac, MD;  Location: [facility];  Service:  Thoracic;  Laterality: Left;    REVIEW OF SYSTEMS:  Constitutional: positive for fatigue Eyes: positive for visual disturbance Ears, nose, mouth, throat, and face: negative Respiratory: positive for dyspnea on exertion Cardiovascular: negative Gastrointestinal: negative Genitourinary:negative Integument/breast: negative Hematologic/lymphatic: negative Musculoskeletal:positive for achiness in left shoulder after Neulasta injection Neurological: negative Behavioral/Psych: negative Endocrine: negative Allergic/Immunologic: negative   PHYSICAL EXAMINATION: General appearance: alert, cooperative, appears stated age and no distress Head: Normocephalic, without obvious abnormality, atraumatic Neck: no adenopathy, no carotid bruit, no JVD, supple, symmetrical, trachea midline and thyroid not enlarged, symmetric, no tenderness/mass/nodules Lymph nodes: Cervical, supraclavicular, and axillary nodes normal. Resp: clear to auscultation bilaterally Back: symmetric, no curvature. ROM normal. No CVA tenderness. Cardio: regular rate and rhythm, S1, S2 normal, no murmur, click, rub or gallop GI: soft, non-tender; bowel sounds normal; no masses,  no organomegaly Extremities: extremities normal, atraumatic, no cyanosis or edema Neurologic: Alert and oriented X 3, normal strength and tone. Normal symmetric reflexes. Normal coordination and gait Skin: There is  erythema and dryness behind both ears as well as similar erythematous areas on her scalp. The anterior chest skin is hyperpigmented and dry. No active oozing  ECOG PERFORMANCE STATUS: 1 - Symptomatic but completely ambulatory  Blood pressure 125/73, pulse 111, temperature 99 F (37.2 C), temperature source Oral, resp. rate 18, height 5\' 6"  (1.676 m), weight 182 lb 3.2 oz (82.645 kg).  LABORATORY DATA: Lab Results  Component Value Date   WBC 10.2 01/22/2014   HGB 11.3* 01/22/2014   HCT 34.6* 01/22/2014   MCV 90.2 01/22/2014   PLT 233 01/22/2014      Chemistry      Component Value Date/Time   NA 142 01/22/2014 1331   NA 140 12/19/2013 0320   K 4.3 01/22/2014 1331   K 4.3 12/19/2013 0320   CL 100 12/19/2013 0320   CO2 22 01/22/2014 1331   CO2 28 12/19/2013 0320   BUN 21.9 01/22/2014 1331   BUN 21 12/19/2013 0320   CREATININE 0.8 01/22/2014 1331   CREATININE 0.78 12/19/2013 0320      Component Value Date/Time   CALCIUM 9.5 01/22/2014 1331   CALCIUM 9.7 12/19/2013 0320   ALKPHOS 73 01/22/2014 1331   ALKPHOS 54 12/19/2013 0320   AST 20 01/22/2014 1331   AST 24 12/19/2013 0320   ALT 62* 01/22/2014 1331   ALT 48* 12/19/2013 0320   BILITOT 0.32 01/22/2014 1331   BILITOT <0.2* 12/19/2013 0320       RADIOGRAPHIC STUDIES:  Dg Chest 2 View  12/13/2013   CLINICAL DATA:  Status post bronchoscopy  EXAM: CHEST  2 VIEW  COMPARISON:  12/13/2013  FINDINGS: Cardiac shadow is within normal limits. A new left-sided chest wall port is seen with the catheter tip in the mid to distal superior vena cava. No pneumothorax is noted. Persistent left hilar/ mediastinal mass lesion is seen. There is decreased inspiratory effort with atelectatic changes in the left lung likely related to obstructive change from the known mass lesion. The right lung is clear.  IMPRESSION: No pneumothorax following port placement.  Poor aeration of the left lung likely related to central compression and poor inspiratory effort.   Electronically  Signed   By: Inez Catalina M.D.   On: 12/13/2013 11:28   Chest 2 View  12/13/2013   CLINICAL DATA:  Preoperative  EXAM: CHEST  2 VIEW  COMPARISON:  Prior CT from 12/09/2013  FINDINGS: Abnormal convexity along the left mediastinal border overlying the region of  the U aortic knob is compatible with known left hilar/mediastinal mass within this region. Mild cardiomegaly is stable.  Lungs are otherwise clear without focal infiltrate, pulmonary edema, or pleural effusion. No pneumothorax.  No acute osseous abnormality.  IMPRESSION: 1. Stable appearance of left hilar/ mediastinal mass as compared to recent CT from 12/09/2013. 2. No other acute cardiopulmonary abnormality identified.   Electronically Signed   By: Jeannine Boga M.D.   On: 12/13/2013 06:19   Ct Angio Chest Pe W/cm &/or Wo Cm  12/17/2013   CLINICAL DATA:  Chest pain and shortness of breath.  Lung cancer.  EXAM: CT ANGIOGRAPHY CHEST WITH CONTRAST  TECHNIQUE: Multidetector CT imaging of the chest was performed using the standard protocol during bolus administration of intravenous contrast. Multiplanar CT image reconstructions and MIPs were obtained to evaluate the vascular anatomy.  CONTRAST:  140mL OMNIPAQUE IOHEXOL 350 MG/ML SOLN  COMPARISON:  CT chest 12/09/2013.  FINDINGS: Re-demonstrated is a large mass centered at the left hilum and invading the adjacent mediastinum including AP window. Pericardial invasion is present. 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 Small left lower lobe branches noted previously no longer fill, likely due to thrombosis. No developing left lung infarction. Slight increase left pleural effusion could be malignant or reactive. Pericardial invasion. Slight decrease pericardial effusion. No acute PE on the right. Unchanged spiculated nodule left upper lobe. No right lung infiltrates. Stable mediastinal adenopathy.  Stable left adrenal mass. Hepatic steatosis. Normal  aorta. No destructive osseous lesions.  Review of the MIP images confirms the above findings.  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 No developing left lung infarction. Slight increase left pleural effusion. Pericardial invasion with Stable mediastinal adenopathy.   Electronically Signed   By: Rolla Flatten M.D.   On: 12/17/2013 08:14   Ct Angio Chest Pe W/cm &/or Wo Cm  12/09/2013   CLINICAL DATA:  Chest pain and severe cough. Pulmonary embolism in 1980. Ex-smoker.  BUN and creatinine were obtained on site at [facility] at  [address]  Results:  BUN 14 mg/dL,  Creatinine 0.6 mg/dL.  EXAM: CT ANGIOGRAPHY CHEST WITH CONTRAST  TECHNIQUE: Multidetector CT imaging of the chest was performed using the standard protocol during bolus administration of intravenous contrast. Multiplanar CT image reconstructions and MIPs were obtained to evaluate the vascular anatomy.  CONTRAST:  162mL OMNIPAQUE IOHEXOL 350 MG/ML SOLN  COMPARISON:  Chest radiographs dated 02/03/2009 and chest CT dated 05/20/2007.  FINDINGS: Large mass centered at the left hilum and invading the adjacent mediastinum, including the AP window. This mass measures 8.9 x 5.6 cm on image number 44 of series 4 and 6.8 cm in length on image number 50 of series 600. This is compressing and causing almost complete occlusion of the distal left main pulmonary artery with lack of opacification of the majority of the left pulmonary artery branches.  There is also a new spiculated nodule in the left upper lobe measuring 1.2 cm in maximum diameter on image number 27 of series 5 which is also a new. There is also a new 4 mm subpleural nodule in the left upper lobe on image number 27 of series 5.  Also noted are multiple enlarged mediastinal lymph nodes. The majority of these are in the AP window, including a node with a short axis diameter of 13.5 mm on image  number 45. A proximal right hilar node has a short axis diameter of 10 mm on image number 44.  There is an interval oval, mass-like area of in the pericardium on the left, measuring 2.8 x 1.0 cm on image number 69. There is also a small pericardial effusion with a maximum thickness of 7 mm.  There are multiple areas of patchy density in the left upper lobe, lingula and left lower lobe. There is also an interval 3 mm subpleural nodule in the left lower lobe on image number 89, at the inferior aspect of somewhat nodular patchy opacity.  A small left pleural effusion is noted. On the last image, there is a suggestion of a partially imaged left adrenal mass, measuring 1.3 x 0.9 cm. The more inferior portion of the left adrenal gland is not included and the majority of the right adrenal gland is not included.  The remainder of the lungs are mildly hyperexpanded with mildly prominent interstitial markings. Diffuse peribronchial thickening is also noted.  The pulmonary arteries on the right are normally opacified with no pulmonary emboli seen.  Mild diffuse low density of the liver is noted in the upper abdomen. Thoracic spine degenerative changes are noted.  Review of the MIP images confirms the above findings.  IMPRESSION: 1. 8.9 x 6.8 x 5.6 cm left hilar and mediastinal mass causing almost complete occlusion of the distal left main pulmonary artery with lack of opacification of the majority of the pulmonary arteries on the left. 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 2. Mediastinal and right hilar metastatic adenopathy. 3. Possible left adrenal metastasis. 4. 1.2 cm spiculated left upper lobe nodule, concerning for a primary lung carcinoma. 5. 4 mm left upper lobe subpleural nodules suspicious for a metastasis. 6. Possible left pericardial metastasis measuring 2.8 x 1.0 cm. 7. Patchy opacities most likely representing areas of pulmonary infarction in the left upper  lobe, lingula and left lower lobe. An area in the left lower lobe has nodular components. 8. Small left pleural effusion. 9. Small pericardial effusion. 10. COPD. 11. Mild diffuse hepatic steatosis.  These results were called by telephone at the time of interpretation on 12/09/2013 at 12:56 PM to Dr. Antony Contras , who verbally acknowledged these results.   Electronically Signed   By: Enrique Sack M.D.   On: 12/09/2013 12:30   Mr Jeri Cos YD Contrast  12/18/2013   CLINICAL DATA:  Small cell lung cancer, evaluate for brain metastasis.  EXAM: MRI HEAD WITHOUT AND WITH CONTRAST  TECHNIQUE: Multiplanar, multiecho pulse sequences of the brain and surrounding structures were obtained without and with intravenous contrast.  CONTRAST:  21mL MULTIHANCE GADOBENATE DIMEGLUMINE 529 MG/ML IV SOLN  COMPARISON:  CT HEAD WO/W CM dated 05/19/2005  FINDINGS: At least 7 infratentorial mildly enhancing lesions with slight reduced diffusion consistent with hypercellularity consistent with metastasis, largest in the left cerebellum measuring 8 x 9 mm.  At least 19 supratentorial enhancing hypermetabolic metastasis seen, with surrounding bright FLAIR T2 hyperintense vasogenic edema, including a 8 x 9 mm lesion in right temporal lobe, tiny lesions in the right posterior frontal lobe, motor cortex. 3-4 mm enhancing lesions in the caudate bilaterally. No midline shift or mass effect. Tiny foci of susceptibility artifact associated with a few of the metastasis in the supratentorial brain. A few of the metastasis shows central hypo enhancement consistent with cystic necrosis. No reduced diffusion to suggest acute ischemia. The ventricles and sulci are  overall normal for patient's age.  Additional patchy FLAIR T2 hyperintense signal in the pons, with scattered subcentimeter supratentorial T2 hyperintensity suggesting sequelae of chronic small vessel ischemic disease. Right occipital developmental venous anomaly, benign finding.  No abnormal  extra-axial fluid collections. Though many of the intracranial metastasis are within the cortex/gray-white matter junction, no convincing evidence of dural or leptomeningeal metastatic disease. Normal major intracranial vascular flow voids seen at the skull base.  Ocular globes and orbital contents are unremarkable. No paranasal sinus air-fluid levels. Mastoid air cells appear well-aerated. Of note, there is mild deformity of the left dorsal spinal cord due to apparent C1-2 arthropathy. No abnormal sellar expansion. No cerebellar tonsillar ectopia.  IMPRESSION: At least 7 infratentorial and 19 supratentorial subcentimeter enhancing intraparenchymal lesions consistent with metastatic disease, some of which are slightly hemorrhagic and/or cystic. Associated mild vasogenic edema without significant mass effect.  Mild-to-moderate additional white matter changes suggest sequelae of chronic small vessel ischemic disease, less likely nonenhancing metastatic disease.   Electronically Signed   By: Elon Alas   On: 12/18/2013 00:23   Dg Fluoro Guide Cv Line-no Report  12/13/2013   CLINICAL DATA: inserting port   FLOURO GUIDE CV LINE  Fluoroscopy was utilized by the requesting physician.  No radiographic  interpretation.    Mm Screening Breast Tomo Bilateral  12/05/2013   CLINICAL DATA:  Screening.  EXAM: DIGITAL SCREENING BILATERAL MAMMOGRAM WITH 3D TOMO WITH CAD  COMPARISON:  Previous exam(s).  ACR Breast Density Category b: There are scattered areas of fibroglandular density.  FINDINGS: There are no findings suspicious for malignancy. Images were processed with CAD.  IMPRESSION: No mammographic evidence of malignancy. A result letter of this screening mammogram will be mailed directly to the patient.  RECOMMENDATION: Screening mammogram in one year. (Code:SM-B-01Y)  BI-RADS CATEGORY  1: Negative.   Electronically Signed   By: Lovey Newcomer M.D.   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 She is status post whole brain irradiation under the care of Dr. Sondra Come completed on 12/31/2013. The patient was discussed with also seen by Dr. Julien Nordmann. She is status post her first cycle of systemic chemotherapy with carboplatin for an AUC of 5 given on day 1 and etoposide at 120 mg per meter squared given on days 1, 2 and 3 with Neulasta given on day 4 on 01/01/2014. Overall she tolerated this first cycle relatively well with the exception of some weakness for a few days as well some achiness in her shoulder after the Neulasta injection. She will continue with cycle #2 of her systemic chemotherapy with carboplatin and etoposide with Neulasta support as scheduled. She'll followup with Dr. Julien Nordmann in 3 weeks with a restaging CT scan of the chest, abdomen and pelvis with contrast to reevaluate her disease. She'll continue with weekly labs as scheduled. She's completed her dexamethasone taper as prescribed. She will keep [place] informed about her intermittent blurred vision. I will reach out to one of the radiation oncology nurses to take a look at her skin to see if there is any thing else they can suggest for relief. I believe that these skin changes are related to her recent radiation therapy course.     Carlton Adam, PA-C All questions were answered. The patient knows to call the clinic with any problems, questions or concerns. We can certainly see the patient  much sooner if necessary.  ADDENDUM: Hematology/Oncology Attending:  I had a face to face encounter with the patient. I recommended her care plan. This is a very pleasant 58 years old white female with extensive stage small cell lung cancer currently undergoing systemic chemotherapy with carboplatin and etoposide status post 1 cycle. She tolerated the first cycle of her treatment fairly well with no significant  adverse effects except for mild fatigue in addition to alopecia. She is currently undergoing treatment with cycle #2. I recommended for the patient to continue her chemotherapy today as scheduled. She would come back for follow up visit in 3 weeks after repeating CT scan of the chest, abdomen and pelvis for restaging of her disease. She was advised to call immediately if she has any concerning symptoms in the interval.   Disclaimer: This note was dictated with voice recognition software. Similar sounding words can inadvertently be transcribed and may not be corrected upon review. Curt Bears, MD 01/28/2014

## 2014-01-23 NOTE — Telephone Encounter (Signed)
, °

## 2014-01-23 NOTE — Progress Notes (Signed)
Patient called from infusion stating that her skin is peeling and asking for cream.  Went to see her in infusion.  She has an area of redness/peeling behind her left ear and her central chest.  She reports she has some radiaplex left and can use that.  Also gave her some Aquaphor samples to use if needed.  She also said her scalp is very dry and itchy.  Advised her to use baby shampoo.  She has an appointment with Dr. Sondra Come on June 8.

## 2014-01-23 NOTE — Patient Instructions (Signed)
Continue weekly labs as scheduled Follow with Dr. Julien Nordmann in 3 weeks with restaging CT scan of the chest, abdomen and pelvis to reevaluate your disease

## 2014-01-24 ENCOUNTER — Ambulatory Visit (HOSPITAL_BASED_OUTPATIENT_CLINIC_OR_DEPARTMENT_OTHER): Payer: 59

## 2014-01-24 ENCOUNTER — Telehealth: Payer: Self-pay | Admitting: *Deleted

## 2014-01-24 VITALS — BP 130/63 | HR 105 | Temp 97.7°F

## 2014-01-24 DIAGNOSIS — Z5111 Encounter for antineoplastic chemotherapy: Secondary | ICD-10-CM

## 2014-01-24 DIAGNOSIS — C7949 Secondary malignant neoplasm of other parts of nervous system: Secondary | ICD-10-CM

## 2014-01-24 DIAGNOSIS — C7931 Secondary malignant neoplasm of brain: Secondary | ICD-10-CM

## 2014-01-24 DIAGNOSIS — C3490 Malignant neoplasm of unspecified part of unspecified bronchus or lung: Secondary | ICD-10-CM

## 2014-01-24 DIAGNOSIS — C3410 Malignant neoplasm of upper lobe, unspecified bronchus or lung: Secondary | ICD-10-CM

## 2014-01-24 MED ORDER — DEXAMETHASONE SODIUM PHOSPHATE 10 MG/ML IJ SOLN
10.0000 mg | Freq: Once | INTRAMUSCULAR | Status: AC
  Administered 2014-01-24: 10 mg via INTRAVENOUS

## 2014-01-24 MED ORDER — SODIUM CHLORIDE 0.9 % IV SOLN
120.0000 mg/m2 | Freq: Once | INTRAVENOUS | Status: AC
  Administered 2014-01-24: 240 mg via INTRAVENOUS
  Filled 2014-01-24: qty 12

## 2014-01-24 MED ORDER — HEPARIN SOD (PORK) LOCK FLUSH 100 UNIT/ML IV SOLN
500.0000 [IU] | Freq: Once | INTRAVENOUS | Status: AC | PRN
  Administered 2014-01-24: 500 [IU]
  Filled 2014-01-24: qty 5

## 2014-01-24 MED ORDER — ONDANSETRON 8 MG/50ML IVPB (CHCC)
8.0000 mg | Freq: Once | INTRAVENOUS | Status: AC
  Administered 2014-01-24: 8 mg via INTRAVENOUS

## 2014-01-24 MED ORDER — SODIUM CHLORIDE 0.9 % IV SOLN
Freq: Once | INTRAVENOUS | Status: AC
  Administered 2014-01-24: 14:00:00 via INTRAVENOUS

## 2014-01-24 MED ORDER — DEXAMETHASONE SODIUM PHOSPHATE 10 MG/ML IJ SOLN
INTRAMUSCULAR | Status: AC
  Filled 2014-01-24: qty 1

## 2014-01-24 MED ORDER — ONDANSETRON 8 MG/NS 50 ML IVPB
INTRAVENOUS | Status: AC
  Filled 2014-01-24: qty 8

## 2014-01-24 MED ORDER — SODIUM CHLORIDE 0.9 % IJ SOLN
10.0000 mL | INTRAMUSCULAR | Status: DC | PRN
  Administered 2014-01-24: 10 mL
  Filled 2014-01-24: qty 10

## 2014-01-24 NOTE — Patient Instructions (Signed)
[facility] Discharge Instructions for Patients Receiving Chemotherapy  Today you received the following chemotherapy agents Etoposide (VP 16) To help prevent nausea and vomiting after your treatment, we encourage you to take your nausea medication as prescribed.  If you develop nausea and vomiting that is not controlled by your nausea medication, call the clinic.   BELOW ARE SYMPTOMS THAT SHOULD BE REPORTED IMMEDIATELY:  *FEVER GREATER THAN 100.5 F  *CHILLS WITH OR WITHOUT FEVER  NAUSEA AND VOMITING THAT IS NOT CONTROLLED WITH YOUR NAUSEA MEDICATION  *UNUSUAL SHORTNESS OF BREATH  *UNUSUAL BRUISING OR BLEEDING  TENDERNESS IN MOUTH AND THROAT WITH OR WITHOUT PRESENCE OF ULCERS  *URINARY PROBLEMS  *BOWEL PROBLEMS  UNUSUAL RASH Items with * indicate a potential emergency and should be followed up as soon as possible.  Feel free to call the clinic you have any questions or concerns. The clinic phone number is [phone].

## 2014-01-24 NOTE — Telephone Encounter (Signed)
Per staff message and POF I have scheduled appts.  JMW  

## 2014-01-25 ENCOUNTER — Ambulatory Visit (HOSPITAL_BASED_OUTPATIENT_CLINIC_OR_DEPARTMENT_OTHER): Payer: 59

## 2014-01-25 VITALS — BP 128/82 | HR 87 | Temp 98.4°F

## 2014-01-25 DIAGNOSIS — C7949 Secondary malignant neoplasm of other parts of nervous system: Secondary | ICD-10-CM

## 2014-01-25 DIAGNOSIS — C7931 Secondary malignant neoplasm of brain: Secondary | ICD-10-CM

## 2014-01-25 DIAGNOSIS — C3410 Malignant neoplasm of upper lobe, unspecified bronchus or lung: Secondary | ICD-10-CM

## 2014-01-25 DIAGNOSIS — Z5189 Encounter for other specified aftercare: Secondary | ICD-10-CM

## 2014-01-25 MED ORDER — PEGFILGRASTIM INJECTION 6 MG/0.6ML
6.0000 mg | Freq: Once | SUBCUTANEOUS | Status: AC
  Administered 2014-01-25: 6 mg via SUBCUTANEOUS

## 2014-01-25 NOTE — Patient Instructions (Signed)

## 2014-01-29 ENCOUNTER — Other Ambulatory Visit (HOSPITAL_BASED_OUTPATIENT_CLINIC_OR_DEPARTMENT_OTHER): Payer: 59

## 2014-01-29 ENCOUNTER — Encounter: Payer: Self-pay | Admitting: Oncology

## 2014-01-29 DIAGNOSIS — C3410 Malignant neoplasm of upper lobe, unspecified bronchus or lung: Secondary | ICD-10-CM

## 2014-01-29 DIAGNOSIS — C3490 Malignant neoplasm of unspecified part of unspecified bronchus or lung: Secondary | ICD-10-CM

## 2014-01-29 LAB — CBC WITH DIFFERENTIAL/PLATELET
BASO%: 1.4 % (ref 0.0–2.0)
Basophils Absolute: 0.1 10*3/uL (ref 0.0–0.1)
EOS%: 0.2 % (ref 0.0–7.0)
Eosinophils Absolute: 0 10*3/uL (ref 0.0–0.5)
HEMATOCRIT: 31.9 % — AB (ref 34.8–46.6)
HGB: 10.3 g/dL — ABNORMAL LOW (ref 11.6–15.9)
LYMPH%: 14.4 % (ref 14.0–49.7)
MCH: 29 pg (ref 25.1–34.0)
MCHC: 32.3 g/dL (ref 31.5–36.0)
MCV: 89.6 fL (ref 79.5–101.0)
MONO#: 0.1 10*3/uL (ref 0.1–0.9)
MONO%: 2.7 % (ref 0.0–14.0)
NEUT#: 3 10*3/uL (ref 1.5–6.5)
NEUT%: 81.3 % — AB (ref 38.4–76.8)
Platelets: 192 10*3/uL (ref 145–400)
RBC: 3.56 10*6/uL — AB (ref 3.70–5.45)
RDW: 16 % — ABNORMAL HIGH (ref 11.2–14.5)
WBC: 3.7 10*3/uL — AB (ref 3.9–10.3)
lymph#: 0.5 10*3/uL — ABNORMAL LOW (ref 0.9–3.3)

## 2014-01-29 LAB — COMPREHENSIVE METABOLIC PANEL (CC13)
ALK PHOS: 95 U/L (ref 40–150)
ALT: 67 U/L — AB (ref 0–55)
AST: 25 U/L (ref 5–34)
Albumin: 4 g/dL (ref 3.5–5.0)
Anion Gap: 18 mEq/L — ABNORMAL HIGH (ref 3–11)
BILIRUBIN TOTAL: 0.46 mg/dL (ref 0.20–1.20)
BUN: 19.4 mg/dL (ref 7.0–26.0)
CO2: 22 mEq/L (ref 22–29)
CREATININE: 0.9 mg/dL (ref 0.6–1.1)
Calcium: 9.7 mg/dL (ref 8.4–10.4)
Chloride: 104 mEq/L (ref 98–109)
Glucose: 242 mg/dl — ABNORMAL HIGH (ref 70–140)
Potassium: 4 mEq/L (ref 3.5–5.1)
SODIUM: 145 meq/L (ref 136–145)
Total Protein: 6.9 g/dL (ref 6.4–8.3)

## 2014-02-03 ENCOUNTER — Ambulatory Visit
Admission: RE | Admit: 2014-02-03 | Discharge: 2014-02-03 | Disposition: A | Discharge status: Home / Self Care | Payer: 59 | Source: Ambulatory Visit | Attending: Radiation Oncology | Admitting: Radiation Oncology

## 2014-02-03 ENCOUNTER — Encounter: Payer: Self-pay | Admitting: Radiation Oncology

## 2014-02-03 VITALS — BP 138/84 | HR 98 | Temp 98.6°F | Wt 184.7 lb

## 2014-02-03 DIAGNOSIS — C3490 Malignant neoplasm of unspecified part of unspecified bronchus or lung: Secondary | ICD-10-CM

## 2014-02-03 NOTE — Progress Notes (Signed)
Radiation Oncology         [phone] ________________________________  Name: Carol Bond MRN: [id number]  Date: 02/03/2014  DOB: March 17, 1956  Follow-Up Visit Note  CC: [name],[name], PA-C  Caren Griffins, MD  Diagnosis:   Extensive stage small cell lung cancer  Interval Since Last Radiation:  1  months , she completed treatments to the chest and brain area  Narrative:  The patient returns today for routine follow-up.  She seems to be doing reasonably well at this time. She denies any headaches. Her breathing is much better since completion of chest radiation and 2 cycles of chemotherapy. She complains of some soreness along the tip of her tongue but food overall is tasting better at this time. She was placed on Diflucan earlier but has completed this medication.                              ALLERGIES:  is allergic to actos and vicodin.  Meds: Current Outpatient Prescriptions  Medication Sig Dispense Refill  . albuterol (PROVENTIL HFA;VENTOLIN HFA) 108 (90 BASE) MCG/ACT inhaler Inhale 2 puffs into the lungs every 6 (six) hours as needed for wheezing or shortness of breath.  1 Inhaler  2  . Alum & Mag Hydroxide-Simeth (MAGIC MOUTHWASH) SOLN Take 5 mLs by mouth 4 (four) times daily as needed for mouth pain.  240 mL  0  . aspirin 81 MG tablet Take 162 mg by mouth once.      Marland Kitchen atorvastatin (LIPITOR) 40 MG tablet Take 40 mg by mouth daily.      . beclomethasone (QVAR) 80 MCG/ACT inhaler Inhale 2 puffs into the lungs at bedtime.      . betamethasone dipropionate (DIPROLENE) 0.05 % cream Apply 1 application topically daily as needed.      . Canagliflozin (INVOKANA) 300 MG TABS Take 1 tablet by mouth daily before breakfast.      . emollient (BIAFINE) cream Apply topically 2 (two) times daily.      Marland Kitchen enoxaparin (LOVENOX) 40 MG/0.4ML injection Inject 0.4 mLs (40 mg total) into the skin daily.  30 Syringe  1  . escitalopram (LEXAPRO) 10 MG tablet Take 10 mg by mouth daily.      Marland Kitchen  esomeprazole (NEXIUM) 40 MG capsule Take 40 mg by mouth daily at 12 noon.      . fenofibrate 160 MG tablet Take 160 mg by mouth daily.      . fexofenadine (ALLEGRA) 30 MG tablet Take 30 mg by mouth 2 (two) times daily.      Marland Kitchen glimepiride (AMARYL) 4 MG tablet Take 4 mg by mouth daily with breakfast.      . insulin glargine (LANTUS) 100 unit/mL SOPN Inject 0.05 mLs (5 Units total) into the skin at bedtime. Solostar Pen  15 mL  11  . levothyroxine (SYNTHROID, LEVOTHROID) 150 MCG tablet Take 150 mcg by mouth daily before breakfast.      . lidocaine-prilocaine (EMLA) cream Apply 1 application topically as needed. Apply to port a cath site 90 minutes prior to chemotherapy appointment.  30 g  0  . loratadine (CLARITIN) 10 MG tablet Take 10 mg by mouth daily.      Marland Kitchen LOSARTAN POTASSIUM PO Take 50 mg by mouth daily.      . prochlorperazine (COMPAZINE) 10 MG tablet Take 1 tablet (10 mg total) by mouth every 6 (six) hours as needed for nausea or vomiting.  30 tablet  2  . sitaGLIPtin-metformin (JANUMET) 50-1000 MG per tablet Take 1 tablet by mouth 2 (two) times daily with a meal.      . triamcinolone (NASACORT AQ) 55 MCG/ACT AERO nasal inhaler Place 1 spray into the nose daily.      Marland Kitchen dexamethasone (DECADRON) 4 MG tablet Take 1 tablet (4 mg total) by mouth every 8 (eight) hours.  60 tablet  0   No current facility-administered medications for this encounter.    Physical Findings: The patient is in no acute distress. Patient is alert and oriented.  weight is 184 lb 11.2 oz (83.779 kg). Her temperature is 98.6 F (37 C). Her blood pressure is 138/84 and her pulse is 98. Her oxygen saturation is 92%. .  The oral cavity is moist without secondary infection. No palpable supraclavicular or axillary adenopathy. Lungs are clear to auscultation. The heart has regular rhythm and rate.  Lab Findings: Lab Results  Component Value Date   WBC 3.7* 01/29/2014   HGB 10.3* 01/29/2014   HCT 31.9* 01/29/2014   MCV 89.6  01/29/2014   PLT 192 01/29/2014      Radiographic Findings: No results found.  Impression:  The patient is recovering from the effects of radiation.  Clinically improved with her 2 cycles of chemotherapy and radiation treatments  Plan:  Early next week the patient will undergo repeat imaging of the body as well as brain. When necessary followup in radiation oncology. The patient will continue close followup in medical oncology with additional chemotherapy.  ____________________________________ Blair Promise, MD

## 2014-02-03 NOTE — Progress Notes (Signed)
Patient for routine  one month follow up completion of radiation to brain and lung.Denies headache or pain.Nausea at times after completion of radiation.Has questions regarding continuing levonox.No blood levels since April.I will call to inquire about who needs to handle this.Patient has 2 day supply remaining.Doing well from radiation standpoint.Contine to apply radiaplex to posterior back, has dry peeling area.Informed patient to speak to doctor covering glyburide and invokana to hel[ monitor insulin.

## 2014-02-05 ENCOUNTER — Other Ambulatory Visit (HOSPITAL_BASED_OUTPATIENT_CLINIC_OR_DEPARTMENT_OTHER): Payer: 59

## 2014-02-05 ENCOUNTER — Other Ambulatory Visit: Payer: Self-pay | Admitting: Physician Assistant

## 2014-02-05 DIAGNOSIS — C7931 Secondary malignant neoplasm of brain: Secondary | ICD-10-CM

## 2014-02-05 DIAGNOSIS — C3490 Malignant neoplasm of unspecified part of unspecified bronchus or lung: Secondary | ICD-10-CM

## 2014-02-05 DIAGNOSIS — C3410 Malignant neoplasm of upper lobe, unspecified bronchus or lung: Secondary | ICD-10-CM

## 2014-02-05 DIAGNOSIS — C7949 Secondary malignant neoplasm of other parts of nervous system: Secondary | ICD-10-CM

## 2014-02-05 LAB — CBC WITH DIFFERENTIAL/PLATELET
BASO%: 1.4 % (ref 0.0–2.0)
Basophils Absolute: 0.2 10*3/uL — ABNORMAL HIGH (ref 0.0–0.1)
EOS%: 0.1 % (ref 0.0–7.0)
Eosinophils Absolute: 0 10*3/uL (ref 0.0–0.5)
HEMATOCRIT: 30 % — AB (ref 34.8–46.6)
HGB: 9.7 g/dL — ABNORMAL LOW (ref 11.6–15.9)
LYMPH#: 1.1 10*3/uL (ref 0.9–3.3)
LYMPH%: 7 % — ABNORMAL LOW (ref 14.0–49.7)
MCH: 28.7 pg (ref 25.1–34.0)
MCHC: 32.4 g/dL (ref 31.5–36.0)
MCV: 88.6 fL (ref 79.5–101.0)
MONO#: 0.9 10*3/uL (ref 0.1–0.9)
MONO%: 5.8 % (ref 0.0–14.0)
NEUT#: 13.2 10*3/uL — ABNORMAL HIGH (ref 1.5–6.5)
NEUT%: 85.7 % — AB (ref 38.4–76.8)
PLATELETS: 124 10*3/uL — AB (ref 145–400)
RBC: 3.39 10*6/uL — ABNORMAL LOW (ref 3.70–5.45)
RDW: 17 % — ABNORMAL HIGH (ref 11.2–14.5)
WBC: 15.4 10*3/uL — AB (ref 3.9–10.3)

## 2014-02-05 LAB — COMPREHENSIVE METABOLIC PANEL (CC13)
ALT: 55 U/L (ref 0–55)
ANION GAP: 11 meq/L (ref 3–11)
AST: 26 U/L (ref 5–34)
Albumin: 4.1 g/dL (ref 3.5–5.0)
Alkaline Phosphatase: 90 U/L (ref 40–150)
BUN: 9.7 mg/dL (ref 7.0–26.0)
CALCIUM: 9.9 mg/dL (ref 8.4–10.4)
CHLORIDE: 104 meq/L (ref 98–109)
CO2: 26 meq/L (ref 22–29)
CREATININE: 0.7 mg/dL (ref 0.6–1.1)
Glucose: 166 mg/dl — ABNORMAL HIGH (ref 70–140)
Potassium: 4.2 mEq/L (ref 3.5–5.1)
Sodium: 141 mEq/L (ref 136–145)
Total Bilirubin: 0.46 mg/dL (ref 0.20–1.20)
Total Protein: 7.1 g/dL (ref 6.4–8.3)

## 2014-02-10 ENCOUNTER — Ambulatory Visit (HOSPITAL_COMMUNITY): Payer: 59

## 2014-02-10 ENCOUNTER — Ambulatory Visit (HOSPITAL_COMMUNITY)
Admission: RE | Admit: 2014-02-10 | Discharge: 2014-02-10 | Disposition: A | Discharge status: Home / Self Care | Payer: 59 | Source: Ambulatory Visit | Attending: Physician Assistant | Admitting: Physician Assistant

## 2014-02-10 ENCOUNTER — Encounter (HOSPITAL_COMMUNITY): Payer: Self-pay

## 2014-02-10 ENCOUNTER — Ambulatory Visit (HOSPITAL_COMMUNITY)
Admission: RE | Admit: 2014-02-10 | Discharge: 2014-02-10 | Disposition: A | Discharge status: Home / Self Care | Payer: 59 | Source: Ambulatory Visit | Attending: Internal Medicine | Admitting: Internal Medicine

## 2014-02-10 DIAGNOSIS — C3490 Malignant neoplasm of unspecified part of unspecified bronchus or lung: Secondary | ICD-10-CM

## 2014-02-10 DIAGNOSIS — Z923 Personal history of irradiation: Secondary | ICD-10-CM | POA: Insufficient documentation

## 2014-02-10 DIAGNOSIS — C7931 Secondary malignant neoplasm of brain: Secondary | ICD-10-CM | POA: Insufficient documentation

## 2014-02-10 DIAGNOSIS — K5730 Diverticulosis of large intestine without perforation or abscess without bleeding: Secondary | ICD-10-CM | POA: Insufficient documentation

## 2014-02-10 DIAGNOSIS — K7689 Other specified diseases of liver: Secondary | ICD-10-CM | POA: Insufficient documentation

## 2014-02-10 DIAGNOSIS — E278 Other specified disorders of adrenal gland: Secondary | ICD-10-CM | POA: Insufficient documentation

## 2014-02-10 DIAGNOSIS — C7949 Secondary malignant neoplasm of other parts of nervous system: Secondary | ICD-10-CM

## 2014-02-10 MED ORDER — GADOBENATE DIMEGLUMINE 529 MG/ML IV SOLN
15.0000 mL | Freq: Once | INTRAVENOUS | Status: AC | PRN
  Administered 2014-02-10: 15 mL via INTRAVENOUS

## 2014-02-10 MED ORDER — IOHEXOL 300 MG/ML  SOLN
100.0000 mL | Freq: Once | INTRAMUSCULAR | Status: AC | PRN
  Administered 2014-02-10: 100 mL via INTRAVENOUS

## 2014-02-12 ENCOUNTER — Ambulatory Visit (HOSPITAL_BASED_OUTPATIENT_CLINIC_OR_DEPARTMENT_OTHER): Payer: 59 | Admitting: Internal Medicine

## 2014-02-12 ENCOUNTER — Telehealth: Payer: Self-pay | Admitting: *Deleted

## 2014-02-12 ENCOUNTER — Ambulatory Visit (HOSPITAL_BASED_OUTPATIENT_CLINIC_OR_DEPARTMENT_OTHER): Payer: 59

## 2014-02-12 ENCOUNTER — Telehealth: Payer: Self-pay | Admitting: Internal Medicine

## 2014-02-12 ENCOUNTER — Other Ambulatory Visit (HOSPITAL_BASED_OUTPATIENT_CLINIC_OR_DEPARTMENT_OTHER): Payer: 59

## 2014-02-12 ENCOUNTER — Other Ambulatory Visit: Payer: 59

## 2014-02-12 ENCOUNTER — Encounter: Payer: Self-pay | Admitting: Oncology

## 2014-02-12 ENCOUNTER — Encounter: Payer: Self-pay | Admitting: Internal Medicine

## 2014-02-12 VITALS — BP 125/83 | HR 101 | Temp 97.8°F | Resp 16 | Ht 66.0 in | Wt 182.4 lb

## 2014-02-12 DIAGNOSIS — C3490 Malignant neoplasm of unspecified part of unspecified bronchus or lung: Secondary | ICD-10-CM

## 2014-02-12 DIAGNOSIS — R7309 Other abnormal glucose: Secondary | ICD-10-CM

## 2014-02-12 DIAGNOSIS — C7931 Secondary malignant neoplasm of brain: Secondary | ICD-10-CM

## 2014-02-12 DIAGNOSIS — C7949 Secondary malignant neoplasm of other parts of nervous system: Secondary | ICD-10-CM

## 2014-02-12 DIAGNOSIS — Z5111 Encounter for antineoplastic chemotherapy: Secondary | ICD-10-CM

## 2014-02-12 DIAGNOSIS — C3410 Malignant neoplasm of upper lobe, unspecified bronchus or lung: Secondary | ICD-10-CM

## 2014-02-12 DIAGNOSIS — R739 Hyperglycemia, unspecified: Secondary | ICD-10-CM

## 2014-02-12 LAB — CBC WITH DIFFERENTIAL/PLATELET
BASO%: 2.6 % — ABNORMAL HIGH (ref 0.0–2.0)
BASOS ABS: 0.3 10*3/uL — AB (ref 0.0–0.1)
EOS ABS: 0.2 10*3/uL (ref 0.0–0.5)
EOS%: 1.4 % (ref 0.0–7.0)
HCT: 31.3 % — ABNORMAL LOW (ref 34.8–46.6)
HEMOGLOBIN: 9.8 g/dL — AB (ref 11.6–15.9)
LYMPH%: 10.7 % — ABNORMAL LOW (ref 14.0–49.7)
MCH: 29.1 pg (ref 25.1–34.0)
MCHC: 31.3 g/dL — ABNORMAL LOW (ref 31.5–36.0)
MCV: 92.9 fL (ref 79.5–101.0)
MONO#: 1.2 10*3/uL — AB (ref 0.1–0.9)
MONO%: 10.9 % (ref 0.0–14.0)
NEUT%: 74.4 % (ref 38.4–76.8)
NEUTROS ABS: 8.4 10*3/uL — AB (ref 1.5–6.5)
PLATELETS: 304 10*3/uL (ref 145–400)
RBC: 3.37 10*6/uL — ABNORMAL LOW (ref 3.70–5.45)
RDW: 19.6 % — AB (ref 11.2–14.5)
WBC: 11.3 10*3/uL — ABNORMAL HIGH (ref 3.9–10.3)
lymph#: 1.2 10*3/uL (ref 0.9–3.3)
nRBC: 5 % — ABNORMAL HIGH (ref 0–0)

## 2014-02-12 LAB — COMPREHENSIVE METABOLIC PANEL (CC13)
ALBUMIN: 4 g/dL (ref 3.5–5.0)
ALT: 45 U/L (ref 0–55)
ANION GAP: 9 meq/L (ref 3–11)
AST: 27 U/L (ref 5–34)
Alkaline Phosphatase: 86 U/L (ref 40–150)
BUN: 10.4 mg/dL (ref 7.0–26.0)
CO2: 27 meq/L (ref 22–29)
Calcium: 10 mg/dL (ref 8.4–10.4)
Chloride: 106 mEq/L (ref 98–109)
Creatinine: 0.8 mg/dL (ref 0.6–1.1)
GLUCOSE: 324 mg/dL — AB (ref 70–140)
Potassium: 4.1 mEq/L (ref 3.5–5.1)
Sodium: 142 mEq/L (ref 136–145)
Total Bilirubin: 0.53 mg/dL (ref 0.20–1.20)
Total Protein: 7.2 g/dL (ref 6.4–8.3)

## 2014-02-12 MED ORDER — ONDANSETRON 16 MG/50ML IVPB (CHCC)
INTRAVENOUS | Status: AC
  Filled 2014-02-12: qty 16

## 2014-02-12 MED ORDER — DEXAMETHASONE SODIUM PHOSPHATE 20 MG/5ML IJ SOLN
20.0000 mg | Freq: Once | INTRAMUSCULAR | Status: AC
  Administered 2014-02-12: 20 mg via INTRAVENOUS

## 2014-02-12 MED ORDER — SODIUM CHLORIDE 0.9 % IV SOLN
Freq: Once | INTRAVENOUS | Status: AC
  Administered 2014-02-12: 15:00:00 via INTRAVENOUS

## 2014-02-12 MED ORDER — SODIUM CHLORIDE 0.9 % IV SOLN
650.0000 mg | Freq: Once | INTRAVENOUS | Status: AC
  Administered 2014-02-12: 650 mg via INTRAVENOUS
  Filled 2014-02-12: qty 65

## 2014-02-12 MED ORDER — HEPARIN SOD (PORK) LOCK FLUSH 100 UNIT/ML IV SOLN
500.0000 [IU] | Freq: Once | INTRAVENOUS | Status: AC | PRN
  Administered 2014-02-12: 500 [IU]
  Filled 2014-02-12: qty 5

## 2014-02-12 MED ORDER — SODIUM CHLORIDE 0.9 % IV SOLN
120.0000 mg/m2 | Freq: Once | INTRAVENOUS | Status: AC
  Administered 2014-02-12: 240 mg via INTRAVENOUS
  Filled 2014-02-12: qty 12

## 2014-02-12 MED ORDER — SODIUM CHLORIDE 0.9 % IJ SOLN
10.0000 mL | INTRAMUSCULAR | Status: DC | PRN
Start: 2014-02-12 — End: 2014-02-12
  Administered 2014-02-12: 10 mL
  Filled 2014-02-12: qty 10

## 2014-02-12 MED ORDER — ONDANSETRON 16 MG/50ML IVPB (CHCC)
16.0000 mg | Freq: Once | INTRAVENOUS | Status: AC
  Administered 2014-02-12: 16 mg via INTRAVENOUS

## 2014-02-12 MED ORDER — INSULIN REGULAR HUMAN 100 UNIT/ML IJ SOLN
10.0000 [IU] | Freq: Once | INTRAMUSCULAR | Status: AC
  Administered 2014-02-12: 10 [IU] via SUBCUTANEOUS
  Filled 2014-02-12: qty 0.1

## 2014-02-12 MED ORDER — DEXAMETHASONE SODIUM PHOSPHATE 20 MG/5ML IJ SOLN
INTRAMUSCULAR | Status: AC
  Filled 2014-02-12: qty 5

## 2014-02-12 NOTE — Patient Instructions (Signed)
[facility] Discharge Instructions for Patients Receiving Chemotherapy  Today you received the following chemotherapy agents: Carboplatin and Etoposide   To help prevent nausea and vomiting after your treatment, we encourage you to take your nausea medication as prescribed.  You received 10 units regular insulin in the infusion room for blood glucose 324. Please resume your insulin regimen as prescribed by your doctor at home. Call our clinic or your PCP with questions.    If you develop nausea and vomiting that is not controlled by your nausea medication, call the clinic.   BELOW ARE SYMPTOMS THAT SHOULD BE REPORTED IMMEDIATELY:  *FEVER GREATER THAN 100.5 F  *CHILLS WITH OR WITHOUT FEVER  NAUSEA AND VOMITING THAT IS NOT CONTROLLED WITH YOUR NAUSEA MEDICATION  *UNUSUAL SHORTNESS OF BREATH  *UNUSUAL BRUISING OR BLEEDING  TENDERNESS IN MOUTH AND THROAT WITH OR WITHOUT PRESENCE OF ULCERS  *URINARY PROBLEMS  *BOWEL PROBLEMS  UNUSUAL RASH Items with * indicate a potential emergency and should be followed up as soon as possible.  Feel free to call the clinic you have any questions or concerns. The clinic phone number is [phone].   Hyperglycemia Hyperglycemia occurs when the glucose (sugar) in your blood is too high. Hyperglycemia can happen for many reasons, but it most often happens to people who do not know they have diabetes or are not managing their diabetes properly.  CAUSES  Whether you have diabetes or not, there are other causes of hyperglycemia. Hyperglycemia can occur when you have diabetes, but it can also occur in other situations that you might not be as aware of, such as: Diabetes  If you have diabetes and are having problems controlling your blood glucose, hyperglycemia could occur because of some of the following reasons:  Not following your meal plan.  Not taking your diabetes medications or not taking it properly.  Exercising less or  doing less activity than you normally do.  Being sick. Pre-diabetes  This cannot be ignored. Before people develop Type 2 diabetes, they almost always have "pre-diabetes." This is when your blood glucose levels are higher than normal, but not yet high enough to be diagnosed as diabetes. Research has shown that some long-term damage to the body, especially the heart and circulatory system, may already be occurring during pre-diabetes. If you take action to manage your blood glucose when you have pre-diabetes, you may delay or prevent Type 2 diabetes from developing. Stress  If you have diabetes, you may be "diet" controlled or on oral medications or insulin to control your diabetes. However, you may find that your blood glucose is higher than usual in the hospital whether you have diabetes or not. This is often referred to as "stress hyperglycemia." Stress can elevate your blood glucose. This happens because of hormones put out by the body during times of stress. If stress has been the cause of your high blood glucose, it can be followed regularly by your caregiver. That way he/she can make sure your hyperglycemia does not continue to get worse or progress to diabetes. Steroids  Steroids are medications that act on the infection fighting system (immune system) to block inflammation or infection. One side effect can be a rise in blood glucose. Most people can produce enough extra insulin to allow for this rise, but for those who cannot, steroids make blood glucose levels go even higher. It is not unusual for steroid treatments to "uncover" diabetes that is developing. It is not always possible to determine  if the hyperglycemia will go away after the steroids are stopped. A special blood test called an A1c is sometimes done to determine if your blood glucose was elevated before the steroids were started. SYMPTOMS  Thirsty.  Frequent urination.  Dry mouth.  Blurred vision.  Tired or  fatigue.  Weakness.  Sleepy.  Tingling in feet or leg. DIAGNOSIS  Diagnosis is made by monitoring blood glucose in one or all of the following ways:  A1c test. This is a chemical found in your blood.  Fingerstick blood glucose monitoring.  Laboratory results. TREATMENT  First, knowing the cause of the hyperglycemia is important before the hyperglycemia can be treated. Treatment may include, but is not be limited to:  Education.  Change or adjustment in medications.  Change or adjustment in meal plan.  Treatment for an illness, infection, etc.  More frequent blood glucose monitoring.  Change in exercise plan.  Decreasing or stopping steroids.  Lifestyle changes. HOME CARE INSTRUCTIONS   Test your blood glucose as directed.  Exercise regularly. Your caregiver will give you instructions about exercise. Pre-diabetes or diabetes which comes on with stress is helped by exercising.  Eat wholesome, balanced meals. Eat often and at regular, fixed times. Your caregiver or nutritionist will give you a meal plan to guide your sugar intake.  Being at an ideal weight is important. If needed, losing as little as 10 to 15 pounds may help improve blood glucose levels. SEEK MEDICAL CARE IF:   You have questions about medicine, activity, or diet.  You continue to have symptoms (problems such as increased thirst, urination, or weight gain). SEEK IMMEDIATE MEDICAL CARE IF:   You are vomiting or have diarrhea.  Your breath smells fruity.  You are breathing faster or slower.  You are very sleepy or incoherent.  You have numbness, tingling, or pain in your feet or hands.  You have chest pain.  Your symptoms get worse even though you have been following your caregiver's orders.  If you have any other questions or concerns. Document Released: 02/08/2001 Document Revised: 11/07/2011 Document Reviewed: 12/12/2011 [facility] Patient Information 2015 [place], [place]. This information  is not intended to replace advice given to you by your health care provider. Make sure you discuss any questions you have with your health care provider.

## 2014-02-12 NOTE — Progress Notes (Signed)
Insurance is paying neulasta 100%

## 2014-02-12 NOTE — Progress Notes (Signed)
blood glucose 324, per Dr Vista Mink, pt needs to have 10 units regular insulin and take her home medications as ordered.  SLJ

## 2014-02-12 NOTE — Progress Notes (Signed)
[facility] [phone]   [phone]  OFFICE PROGRESS NOTE  [name], [place] [address]  DIAGNOSIS: Lung cancer  Primary site: Lung (Left)  Staging method: AJCC 7th Edition  Clinical free text: Extensive stage small cell lung cancer  Clinical: Stage IV (T3, N2, M1b) signed by Curt Bears, MD on 12/17/2013 7:45 PM  Summary: Stage IV (T3, N2, M1b)   PRIOR THERAPY:Whole brain irradiation under the care of Dr. Sondra Come   CURRENT THERAPY:  Systemic chemotherapy with carboplatin for an AUC of 5 given on day 1, etoposide at 120 mg per meter square given on days 1, 2 and 3 with Neulasta support given on day 4. Status post 2 cycles.  DISEASE STAGE: Lung cancer  Primary site: Lung (Left)  Staging method: AJCC 7th Edition  Clinical free text: Extensive stage small cell lung cancer  Clinical: Stage IV (T3, N2, M1b) signed by Curt Bears, MD on 12/17/2013 7:45 PM  Summary: Stage IV (T3, N2, M1b)  CHEMOTHERAPY INTENT: Palliative  CURRENT # OF CHEMOTHERAPY CYCLES: 3 CURRENT ANTIEMETICS: none  CURRENT SMOKING STATUS: Former smoker, quit July 2014  ORAL CHEMOTHERAPY AND CONSENT: n/a  CURRENT BISPHOSPHONATES USE: none  PAIN MANAGEMENT: oxycodone  NARCOTICS INDUCED CONSTIPATION: none  LIVING WILL AND CODE STATUS:    INTERVAL HISTORY: Carol Bond 58 y.o. female returns to the clinic today for followup visit accompanied by her husband and daughter. The patient is feeling fine today with no specific complaints except for increasing fatigue with the chemotherapy in addition to intermittent nausea improved with Compazine. She tolerated the last cycle of her systemic chemotherapy with carboplatin and etoposide fairly well. She denied having any significant weight loss or night sweats. She has no fever or chills. She denied having any significant chest pain but continues to have shortness breath with exertion with  no cough or hemoptysis. She had repeat CT scan of the chest, abdomen and pelvis in addition to MRI of the brain performed recently and she is here for evaluation and discussion of her scan results.  MEDICAL HISTORY: Past Medical History  Diagnosis Date  . Pulmonary embolism   . Hypothyroid   . Diabetes   . Cancer     Lung  . Radiation 12/18/13-12/31/13    whole brain 30 gray, left central chest 30 gray    ALLERGIES:  is allergic to actos and vicodin.  MEDICATIONS:  Current Outpatient Prescriptions  Medication Sig Dispense Refill  . albuterol (PROVENTIL HFA;VENTOLIN HFA) 108 (90 BASE) MCG/ACT inhaler Inhale 2 puffs into the lungs every 6 (six) hours as needed for wheezing or shortness of breath.  1 Inhaler  2  . Alum & Mag Hydroxide-Simeth (MAGIC MOUTHWASH) SOLN Take 5 mLs by mouth 4 (four) times daily as needed for mouth pain.  240 mL  0  . aspirin 81 MG tablet Take 162 mg by mouth once.      Marland Kitchen atorvastatin (LIPITOR) 40 MG tablet Take 40 mg by mouth daily.      . beclomethasone (QVAR) 80 MCG/ACT inhaler Inhale 2 puffs into the lungs at bedtime.      . betamethasone dipropionate (DIPROLENE) 0.05 % cream Apply 1 application topically daily as needed.      . Canagliflozin (INVOKANA) 300 MG TABS Take 1 tablet by mouth daily before breakfast.      . dexamethasone (DECADRON) 4 MG tablet Take 1 tablet (4 mg total) by mouth  every 8 (eight) hours.  60 tablet  0  . emollient (BIAFINE) cream Apply topically 2 (two) times daily.      Marland Kitchen enoxaparin (LOVENOX) 40 MG/0.4ML injection Inject 0.4 mLs (40 mg total) into the skin daily.  30 Syringe  1  . escitalopram (LEXAPRO) 10 MG tablet Take 10 mg by mouth daily.      Marland Kitchen esomeprazole (NEXIUM) 40 MG capsule Take 40 mg by mouth daily at 12 noon.      . fenofibrate 160 MG tablet Take 160 mg by mouth daily.      . fexofenadine (ALLEGRA) 30 MG tablet Take 30 mg by mouth 2 (two) times daily.      Marland Kitchen glimepiride (AMARYL) 4 MG tablet Take 4 mg by mouth daily with  breakfast.      . insulin glargine (LANTUS) 100 unit/mL SOPN Inject 0.05 mLs (5 Units total) into the skin at bedtime. Solostar Pen  15 mL  11  . levothyroxine (SYNTHROID, LEVOTHROID) 150 MCG tablet Take 150 mcg by mouth daily before breakfast.      . lidocaine-prilocaine (EMLA) cream Apply 1 application topically as needed. Apply to port a cath site 90 minutes prior to chemotherapy appointment.  30 g  0  . loratadine (CLARITIN) 10 MG tablet Take 10 mg by mouth daily.      Marland Kitchen LOSARTAN POTASSIUM PO Take 50 mg by mouth daily.      . prochlorperazine (COMPAZINE) 10 MG tablet Take 1 tablet (10 mg total) by mouth every 6 (six) hours as needed for nausea or vomiting.  30 tablet  2  . sitaGLIPtin-metformin (JANUMET) 50-1000 MG per tablet Take 1 tablet by mouth 2 (two) times daily with a meal.      . triamcinolone (NASACORT AQ) 55 MCG/ACT AERO nasal inhaler Place 1 spray into the nose daily.       No current facility-administered medications for this visit.    SURGICAL HISTORY:  Past Surgical History  Procedure Laterality Date  . Abdominal hysterectomy    . Spine surgery    . Cervical fusion    . Video bronchoscopy with endobronchial ultrasound N/A 12/13/2013    Procedure: VIDEO BRONCHOSCOPY WITH ENDOBRONCHIAL ULTRASOUND;  Surgeon: Grace Isaac, MD;  Location: [facility];  Service: Thoracic;  Laterality: N/A;  . Portacath placement Left 12/13/2013    Procedure: INSERTION PORT-A-CATH;  Surgeon: Grace Isaac, MD;  Location: [facility];  Service: Thoracic;  Laterality: Left;    REVIEW OF SYSTEMS:  Constitutional: positive for fatigue Eyes: negative Ears, nose, mouth, throat, and face: negative Respiratory: positive for dyspnea on exertion Cardiovascular: negative Gastrointestinal: negative Genitourinary:negative Integument/breast: negative Hematologic/lymphatic: negative Musculoskeletal:negative Neurological: negative Behavioral/Psych: negative Endocrine: negative Allergic/Immunologic:  negative   PHYSICAL EXAMINATION: General appearance: alert, cooperative, fatigued and no distress Head: Normocephalic, without obvious abnormality, atraumatic Neck: no adenopathy, no JVD, supple, symmetrical, trachea midline and thyroid not enlarged, symmetric, no tenderness/mass/nodules Lymph nodes: Cervical, supraclavicular, and axillary nodes normal. Resp: clear to auscultation bilaterally Back: symmetric, no curvature. ROM normal. No CVA tenderness. Cardio: regular rate and rhythm, S1, S2 normal, no murmur, click, rub or gallop GI: soft, non-tender; bowel sounds normal; no masses,  no organomegaly Extremities: extremities normal, atraumatic, no cyanosis or edema Neurologic: Alert and oriented X 3, normal strength and tone. Normal symmetric reflexes. Normal coordination and gait  ECOG PERFORMANCE STATUS: 1 - Symptomatic but completely ambulatory  There were no vitals taken for this visit.  LABORATORY DATA: Lab Results  Component Value  Date   WBC 11.3* 02/12/2014   HGB 9.8* 02/12/2014   HCT 31.3* 02/12/2014   MCV 92.9 02/12/2014   PLT 304 02/12/2014      Chemistry      Component Value Date/Time   NA 141 02/05/2014 1304   NA 140 12/19/2013 0320   K 4.2 02/05/2014 1304   K 4.3 12/19/2013 0320   CL 100 12/19/2013 0320   CO2 26 02/05/2014 1304   CO2 28 12/19/2013 0320   BUN 9.7 02/05/2014 1304   BUN 21 12/19/2013 0320   CREATININE 0.7 02/05/2014 1304   CREATININE 0.78 12/19/2013 0320      Component Value Date/Time   CALCIUM 9.9 02/05/2014 1304   CALCIUM 9.7 12/19/2013 0320   ALKPHOS 90 02/05/2014 1304   ALKPHOS 54 12/19/2013 0320   AST 26 02/05/2014 1304   AST 24 12/19/2013 0320   ALT 55 02/05/2014 1304   ALT 48* 12/19/2013 0320   BILITOT 0.46 02/05/2014 1304   BILITOT <0.2* 12/19/2013 0320       RADIOGRAPHIC STUDIES: Ct Chest W Contrast  02/10/2014   CLINICAL DATA:  Restaging small cell lung cancer.  EXAM: CT CHEST, ABDOMEN, AND PELVIS WITH CONTRAST  TECHNIQUE: Multidetector CT  imaging of the chest, abdomen and pelvis was performed following the standard protocol during bolus administration of intravenous contrast.  CONTRAST:  131mL OMNIPAQUE IOHEXOL 300 MG/ML  SOLN  COMPARISON:  CT of the chest 12/17/2013. PET CT 01/02/2014. CT of the abdomen and pelvis 06/28/2013.  FINDINGS: CT CHEST FINDINGS  The previously seen left upper lobe mass has significantly improved. This currently measures 4.0 x 1.9 cm. On previous CT, this measured 9.1 x 7.4 cm. The encasement of the left pulmonary artery has improved. Continued encasement and possible thrombosis of upper lobe branches. No additional mediastinal or hilar adenopathy. No axillary adenopathy. Left Port-A-Cath remains in place, unchanged. Heart is normal size. Aorta is normal caliber.  Minimal airspace disease in the left upper lobe, likely postobstructive atelectasis or possibly related to radiation change. No suspicious new pulmonary nodules or enlarging pulmonary nodules in the lungs. No pleural effusions.  CT ABDOMEN AND PELVIS FINDINGS  16 mm enhancing area will in the far lateral left hepatic segment which wraps around the margin of the spleen. This could represent flash filling of a hemangioma. Mild diffuse fatty infiltration of the liver noted. Gallbladder, spleen, pancreas, kidneys are normal. Small nodule in the left adrenal gland is stable, measuring approximately 14 mm. Right adrenal gland is unremarkable. Hip stomach, large and small bowel are unremarkable. Descending colonic and sigmoid diverticulosis. No active diverticulitis.  Prior hysterectomy. No adnexal masses. Urinary bladder is grossly unremarkable. No free fluid, free air or adenopathy. Scattered aortic calcifications without aneurysm.  No acute bony abnormality.  IMPRESSION: Significant response of the left upper lobe mass to treatment, with 4 cm residual soft tissue mass in the left suprahilar region. Improving encasement of the left main pulmonary artery.  Probable  small flash filling hemangioma in the lateral segment of the left hepatic lobe.  14 mm left adrenal nodule, nonspecific. Recommend attention on followup imaging.  Mild diffuse fatty infiltration of the liver.   Electronically Signed   By: Rolm Baptise M.D.   On: 02/10/2014 17:33   Mr Jeri Cos OZ Contrast  02/10/2014   CLINICAL DATA:  58 year old female with small cell lung cancer and metastatic disease to the brain. Restaging status post whole brain radiation.  EXAM: MRI HEAD WITHOUT AND WITH  CONTRAST  TECHNIQUE: Multiplanar, multiecho pulse sequences of the brain and surrounding structures were obtained without and with intravenous contrast.  CONTRAST:  104mL MULTIHANCE GADOBENATE DIMEGLUMINE 529 MG/ML IV SOLN  COMPARISON:  12/17/2013.  FINDINGS: Mildly increased motion artifact on post-contrast imaging today.  Regression of all previously identified small enhancing brain metastases. Essentially, no residual enhancing brain metastasis is identified. No dural thickening or new abnormal enhancement. Right occipital lobe developmental venous anomaly (normal anatomic variant) re- identified.  Some of the treated metastases show faint decreased T2* signal (e.g. right superior frontal gyrus series 8, image 19) which may be hemosiderin.  No restricted diffusion to suggest acute infarction. No midline shift, mass effect, evidence of mass lesion, ventriculomegaly, extra-axial collection or acute intracranial hemorrhage. Cervicomedullary junction and pituitary are within normal limits. Major intracranial vascular flow voids are preserved.  Similar cerebral white matter T2 and FLAIR hyperintensity. Resolved small areas of cerebral edema previously identified.  Normal bone marrow signal. Visualized orbit soft tissues are within normal limits. Visualized paranasal sinuses and mastoids are clear. Visualized scalp soft tissues are within normal limits.  IMPRESSION: Regression and essentially complete resolution of metastatic  disease to the brain following whole brain radiation. No new intracranial abnormality.   Electronically Signed   By: Lars Pinks M.D.   On: 02/10/2014 14:36   Ct Abdomen Pelvis W Contrast  02/10/2014   CLINICAL DATA:  Restaging small cell lung cancer.  EXAM: CT CHEST, ABDOMEN, AND PELVIS WITH CONTRAST  TECHNIQUE: Multidetector CT imaging of the chest, abdomen and pelvis was performed following the standard protocol during bolus administration of intravenous contrast.  CONTRAST:  172mL OMNIPAQUE IOHEXOL 300 MG/ML  SOLN  COMPARISON:  CT of the chest 12/17/2013. PET CT 01/02/2014. CT of the abdomen and pelvis 06/28/2013.  FINDINGS: CT CHEST FINDINGS  The previously seen left upper lobe mass has significantly improved. This currently measures 4.0 x 1.9 cm. On previous CT, this measured 9.1 x 7.4 cm. The encasement of the left pulmonary artery has improved. Continued encasement and possible thrombosis of upper lobe branches. No additional mediastinal or hilar adenopathy. No axillary adenopathy. Left Port-A-Cath remains in place, unchanged. Heart is normal size. Aorta is normal caliber.  Minimal airspace disease in the left upper lobe, likely postobstructive atelectasis or possibly related to radiation change. No suspicious new pulmonary nodules or enlarging pulmonary nodules in the lungs. No pleural effusions.  CT ABDOMEN AND PELVIS FINDINGS  16 mm enhancing area will in the far lateral left hepatic segment which wraps around the margin of the spleen. This could represent flash filling of a hemangioma. Mild diffuse fatty infiltration of the liver noted. Gallbladder, spleen, pancreas, kidneys are normal. Small nodule in the left adrenal gland is stable, measuring approximately 14 mm. Right adrenal gland is unremarkable. Hip stomach, large and small bowel are unremarkable. Descending colonic and sigmoid diverticulosis. No active diverticulitis.  Prior hysterectomy. No adnexal masses. Urinary bladder is grossly  unremarkable. No free fluid, free air or adenopathy. Scattered aortic calcifications without aneurysm.  No acute bony abnormality.  IMPRESSION: Significant response of the left upper lobe mass to treatment, with 4 cm residual soft tissue mass in the left suprahilar region. Improving encasement of the left main pulmonary artery.  Probable small flash filling hemangioma in the lateral segment of the left hepatic lobe.  14 mm left adrenal nodule, nonspecific. Recommend attention on followup imaging.  Mild diffuse fatty infiltration of the liver.   Electronically Signed   By: Lennette Bihari  Dover M.D.   On: 02/10/2014 17:33    ASSESSMENT AND PLAN: This is a very pleasant 58 years old white female recently diagnosed with extensive stage small cell lung cancer status post whole brain irradiation in addition to 2 cycles of systemic chemotherapy with carboplatin and etoposide. Her recent scan showed significant improvement in the lung mass in addition to improvement in the metastatic brain lesions. I discussed the scan results and showed the images to the patient and her family. I recommended for her to proceed with systemic chemotherapy today as scheduled. She will start cycle #3 today and she would come back for followup visit in 3 weeks with the start of cycle #4. I may consider the patient for a total number of 6 cycles of systemic chemotherapy with carboplatin and etoposide depending on her response after cycle #4. She was advised to call immediately if she has any concerning symptoms in the interval. The patient voices understanding of current disease status and treatment options and is in agreement with the current care plan.  All questions were answered. The patient knows to call the clinic with any problems, questions or concerns. We can certainly see the patient much sooner if necessary.  I spent 15 minutes counseling the patient face to face. The total time spent in the appointment was 25  minutes.  Disclaimer: This note was dictated with voice recognition software. Similar sounding words can inadvertently be transcribed and may not be corrected upon review.

## 2014-02-12 NOTE — Telephone Encounter (Signed)
Per staff phone call and POF I have schedueld appts. Scheduler advised of appts.  JMW  

## 2014-02-12 NOTE — Telephone Encounter (Signed)
Gave pt appt for lab,md and chemo for july

## 2014-02-13 ENCOUNTER — Ambulatory Visit (HOSPITAL_BASED_OUTPATIENT_CLINIC_OR_DEPARTMENT_OTHER): Payer: 59

## 2014-02-13 VITALS — BP 136/65 | HR 106 | Temp 98.8°F | Resp 18

## 2014-02-13 DIAGNOSIS — C7931 Secondary malignant neoplasm of brain: Secondary | ICD-10-CM

## 2014-02-13 DIAGNOSIS — C3410 Malignant neoplasm of upper lobe, unspecified bronchus or lung: Secondary | ICD-10-CM

## 2014-02-13 DIAGNOSIS — C7949 Secondary malignant neoplasm of other parts of nervous system: Secondary | ICD-10-CM

## 2014-02-13 DIAGNOSIS — C3490 Malignant neoplasm of unspecified part of unspecified bronchus or lung: Secondary | ICD-10-CM

## 2014-02-13 DIAGNOSIS — Z5111 Encounter for antineoplastic chemotherapy: Secondary | ICD-10-CM

## 2014-02-13 MED ORDER — ONDANSETRON 8 MG/NS 50 ML IVPB
INTRAVENOUS | Status: AC
  Filled 2014-02-13: qty 8

## 2014-02-13 MED ORDER — DEXAMETHASONE SODIUM PHOSPHATE 10 MG/ML IJ SOLN
10.0000 mg | Freq: Once | INTRAMUSCULAR | Status: AC
  Administered 2014-02-13: 10 mg via INTRAVENOUS

## 2014-02-13 MED ORDER — SODIUM CHLORIDE 0.9 % IV SOLN
Freq: Once | INTRAVENOUS | Status: AC
  Administered 2014-02-13: 13:00:00 via INTRAVENOUS

## 2014-02-13 MED ORDER — SODIUM CHLORIDE 0.9 % IJ SOLN
10.0000 mL | INTRAMUSCULAR | Status: DC | PRN
  Administered 2014-02-13: 10 mL
  Filled 2014-02-13: qty 10

## 2014-02-13 MED ORDER — ONDANSETRON 8 MG/50ML IVPB (CHCC)
8.0000 mg | Freq: Once | INTRAVENOUS | Status: AC
  Administered 2014-02-13: 8 mg via INTRAVENOUS

## 2014-02-13 MED ORDER — HEPARIN SOD (PORK) LOCK FLUSH 100 UNIT/ML IV SOLN
500.0000 [IU] | Freq: Once | INTRAVENOUS | Status: AC | PRN
  Administered 2014-02-13: 500 [IU]
  Filled 2014-02-13: qty 5

## 2014-02-13 MED ORDER — SODIUM CHLORIDE 0.9 % IV SOLN
120.0000 mg/m2 | Freq: Once | INTRAVENOUS | Status: AC
  Administered 2014-02-13: 240 mg via INTRAVENOUS
  Filled 2014-02-13: qty 12

## 2014-02-13 MED ORDER — DEXAMETHASONE SODIUM PHOSPHATE 10 MG/ML IJ SOLN
INTRAMUSCULAR | Status: AC
  Filled 2014-02-13: qty 1

## 2014-02-13 NOTE — Patient Instructions (Signed)
[facility] Discharge Instructions for Patients Receiving Chemotherapy  Today you received the following chemotherapy agents: Etoposide  To help prevent nausea and vomiting after your treatment, we encourage you to take your nausea medication: Compazine 10 mg every 6 hrs as needed.    If you develop nausea and vomiting that is not controlled by your nausea medication, call the clinic.   BELOW ARE SYMPTOMS THAT SHOULD BE REPORTED IMMEDIATELY:  *FEVER GREATER THAN 100.5 F  *CHILLS WITH OR WITHOUT FEVER  NAUSEA AND VOMITING THAT IS NOT CONTROLLED WITH YOUR NAUSEA MEDICATION  *UNUSUAL SHORTNESS OF BREATH  *UNUSUAL BRUISING OR BLEEDING  TENDERNESS IN MOUTH AND THROAT WITH OR WITHOUT PRESENCE OF ULCERS  *URINARY PROBLEMS  *BOWEL PROBLEMS  UNUSUAL RASH Items with * indicate a potential emergency and should be followed up as soon as possible.  Feel free to call the clinic you have any questions or concerns. The clinic phone number is [phone].

## 2014-02-14 ENCOUNTER — Ambulatory Visit (HOSPITAL_BASED_OUTPATIENT_CLINIC_OR_DEPARTMENT_OTHER): Payer: 59

## 2014-02-14 VITALS — BP 116/69 | HR 78 | Temp 98.5°F | Resp 18

## 2014-02-14 DIAGNOSIS — C7931 Secondary malignant neoplasm of brain: Secondary | ICD-10-CM

## 2014-02-14 DIAGNOSIS — C3490 Malignant neoplasm of unspecified part of unspecified bronchus or lung: Secondary | ICD-10-CM

## 2014-02-14 DIAGNOSIS — C7949 Secondary malignant neoplasm of other parts of nervous system: Secondary | ICD-10-CM

## 2014-02-14 DIAGNOSIS — C3410 Malignant neoplasm of upper lobe, unspecified bronchus or lung: Secondary | ICD-10-CM

## 2014-02-14 DIAGNOSIS — Z5111 Encounter for antineoplastic chemotherapy: Secondary | ICD-10-CM

## 2014-02-14 MED ORDER — HEPARIN SOD (PORK) LOCK FLUSH 100 UNIT/ML IV SOLN
500.0000 [IU] | Freq: Once | INTRAVENOUS | Status: AC | PRN
  Administered 2014-02-14: 500 [IU]
  Filled 2014-02-14: qty 5

## 2014-02-14 MED ORDER — DEXAMETHASONE SODIUM PHOSPHATE 10 MG/ML IJ SOLN
10.0000 mg | Freq: Once | INTRAMUSCULAR | Status: AC
  Administered 2014-02-14: 10 mg via INTRAVENOUS

## 2014-02-14 MED ORDER — SODIUM CHLORIDE 0.9 % IV SOLN
Freq: Once | INTRAVENOUS | Status: AC
  Administered 2014-02-14: 14:00:00 via INTRAVENOUS

## 2014-02-14 MED ORDER — DEXAMETHASONE SODIUM PHOSPHATE 10 MG/ML IJ SOLN
INTRAMUSCULAR | Status: AC
  Filled 2014-02-14: qty 1

## 2014-02-14 MED ORDER — SODIUM CHLORIDE 0.9 % IV SOLN
120.0000 mg/m2 | Freq: Once | INTRAVENOUS | Status: AC
  Administered 2014-02-14: 240 mg via INTRAVENOUS
  Filled 2014-02-14: qty 12

## 2014-02-14 MED ORDER — SODIUM CHLORIDE 0.9 % IJ SOLN
10.0000 mL | INTRAMUSCULAR | Status: DC | PRN
  Administered 2014-02-14: 10 mL
  Filled 2014-02-14: qty 10

## 2014-02-14 MED ORDER — ONDANSETRON 8 MG/50ML IVPB (CHCC)
8.0000 mg | Freq: Once | INTRAVENOUS | Status: AC
  Administered 2014-02-14: 8 mg via INTRAVENOUS

## 2014-02-14 MED ORDER — ONDANSETRON 8 MG/NS 50 ML IVPB
INTRAVENOUS | Status: AC
  Filled 2014-02-14: qty 8

## 2014-02-14 NOTE — Patient Instructions (Signed)
[facility] Discharge Instructions for Patients Receiving Chemotherapy  Today you received the following chemotherapy agents: Etoposide  To help prevent nausea and vomiting after your treatment, we encourage you to take your nausea medication as prescribed.    If you develop nausea and vomiting that is not controlled by your nausea medication, call the clinic.   BELOW ARE SYMPTOMS THAT SHOULD BE REPORTED IMMEDIATELY:  *FEVER GREATER THAN 100.5 F  *CHILLS WITH OR WITHOUT FEVER  NAUSEA AND VOMITING THAT IS NOT CONTROLLED WITH YOUR NAUSEA MEDICATION  *UNUSUAL SHORTNESS OF BREATH  *UNUSUAL BRUISING OR BLEEDING  TENDERNESS IN MOUTH AND THROAT WITH OR WITHOUT PRESENCE OF ULCERS  *URINARY PROBLEMS  *BOWEL PROBLEMS  UNUSUAL RASH Items with * indicate a potential emergency and should be followed up as soon as possible.  Feel free to call the clinic you have any questions or concerns. The clinic phone number is [phone].

## 2014-02-15 ENCOUNTER — Ambulatory Visit (HOSPITAL_BASED_OUTPATIENT_CLINIC_OR_DEPARTMENT_OTHER): Payer: 59

## 2014-02-15 VITALS — BP 113/70 | HR 84 | Temp 98.6°F | Resp 18

## 2014-02-15 DIAGNOSIS — C7931 Secondary malignant neoplasm of brain: Secondary | ICD-10-CM

## 2014-02-15 DIAGNOSIS — Z5189 Encounter for other specified aftercare: Secondary | ICD-10-CM

## 2014-02-15 DIAGNOSIS — C3410 Malignant neoplasm of upper lobe, unspecified bronchus or lung: Secondary | ICD-10-CM

## 2014-02-15 DIAGNOSIS — C7949 Secondary malignant neoplasm of other parts of nervous system: Secondary | ICD-10-CM

## 2014-02-15 DIAGNOSIS — C3490 Malignant neoplasm of unspecified part of unspecified bronchus or lung: Secondary | ICD-10-CM

## 2014-02-15 MED ORDER — PEGFILGRASTIM INJECTION 6 MG/0.6ML
6.0000 mg | Freq: Once | SUBCUTANEOUS | Status: AC
  Administered 2014-02-15: 6 mg via SUBCUTANEOUS

## 2014-02-19 ENCOUNTER — Other Ambulatory Visit: Payer: 59

## 2014-02-19 ENCOUNTER — Other Ambulatory Visit (HOSPITAL_BASED_OUTPATIENT_CLINIC_OR_DEPARTMENT_OTHER): Payer: 59

## 2014-02-19 DIAGNOSIS — C7949 Secondary malignant neoplasm of other parts of nervous system: Secondary | ICD-10-CM

## 2014-02-19 DIAGNOSIS — C3490 Malignant neoplasm of unspecified part of unspecified bronchus or lung: Secondary | ICD-10-CM

## 2014-02-19 DIAGNOSIS — C7931 Secondary malignant neoplasm of brain: Secondary | ICD-10-CM

## 2014-02-19 DIAGNOSIS — C3410 Malignant neoplasm of upper lobe, unspecified bronchus or lung: Secondary | ICD-10-CM

## 2014-02-19 LAB — CBC WITH DIFFERENTIAL/PLATELET
BASO%: 1.1 % (ref 0.0–2.0)
Basophils Absolute: 0.1 10*3/uL (ref 0.0–0.1)
EOS%: 1.4 % (ref 0.0–7.0)
Eosinophils Absolute: 0.1 10*3/uL (ref 0.0–0.5)
HCT: 26.6 % — ABNORMAL LOW (ref 34.8–46.6)
HGB: 8.9 g/dL — ABNORMAL LOW (ref 11.6–15.9)
LYMPH#: 0.3 10*3/uL — AB (ref 0.9–3.3)
LYMPH%: 5.7 % — ABNORMAL LOW (ref 14.0–49.7)
MCH: 29.7 pg (ref 25.1–34.0)
MCHC: 33.4 g/dL (ref 31.5–36.0)
MCV: 89 fL (ref 79.5–101.0)
MONO#: 0.2 10*3/uL (ref 0.1–0.9)
MONO%: 2.7 % (ref 0.0–14.0)
NEUT#: 5.4 10*3/uL (ref 1.5–6.5)
NEUT%: 89.1 % — AB (ref 38.4–76.8)
Platelets: 175 10*3/uL (ref 145–400)
RBC: 2.99 10*6/uL — ABNORMAL LOW (ref 3.70–5.45)
RDW: 18 % — AB (ref 11.2–14.5)
WBC: 6 10*3/uL (ref 3.9–10.3)

## 2014-02-19 LAB — COMPREHENSIVE METABOLIC PANEL (CC13)
ALT: 48 U/L (ref 0–55)
AST: 26 U/L (ref 5–34)
Albumin: 3.9 g/dL (ref 3.5–5.0)
Alkaline Phosphatase: 101 U/L (ref 40–150)
Anion Gap: 8 mEq/L (ref 3–11)
BUN: 13.6 mg/dL (ref 7.0–26.0)
CHLORIDE: 105 meq/L (ref 98–109)
CO2: 27 mEq/L (ref 22–29)
CREATININE: 0.7 mg/dL (ref 0.6–1.1)
Calcium: 9.6 mg/dL (ref 8.4–10.4)
Glucose: 136 mg/dl (ref 70–140)
POTASSIUM: 4 meq/L (ref 3.5–5.1)
SODIUM: 141 meq/L (ref 136–145)
TOTAL PROTEIN: 6.9 g/dL (ref 6.4–8.3)
Total Bilirubin: 0.49 mg/dL (ref 0.20–1.20)

## 2014-02-21 ENCOUNTER — Encounter: Payer: Self-pay | Admitting: Internal Medicine

## 2014-02-21 NOTE — Progress Notes (Signed)
Faxed fmla form to Brenda Jones @ [phone] °

## 2014-02-25 ENCOUNTER — Encounter: Payer: Self-pay | Admitting: Physician Assistant

## 2014-02-26 ENCOUNTER — Other Ambulatory Visit (HOSPITAL_BASED_OUTPATIENT_CLINIC_OR_DEPARTMENT_OTHER): Payer: 59

## 2014-02-26 ENCOUNTER — Other Ambulatory Visit: Payer: Self-pay | Admitting: *Deleted

## 2014-02-26 DIAGNOSIS — N329 Bladder disorder, unspecified: Secondary | ICD-10-CM

## 2014-02-26 DIAGNOSIS — B370 Candidal stomatitis: Secondary | ICD-10-CM

## 2014-02-26 DIAGNOSIS — C3410 Malignant neoplasm of upper lobe, unspecified bronchus or lung: Secondary | ICD-10-CM

## 2014-02-26 DIAGNOSIS — C3490 Malignant neoplasm of unspecified part of unspecified bronchus or lung: Secondary | ICD-10-CM

## 2014-02-26 DIAGNOSIS — R300 Dysuria: Secondary | ICD-10-CM

## 2014-02-26 LAB — COMPREHENSIVE METABOLIC PANEL (CC13)
ALBUMIN: 4.2 g/dL (ref 3.5–5.0)
ALT: 54 U/L (ref 0–55)
ANION GAP: 14 meq/L — AB (ref 3–11)
AST: 26 U/L (ref 5–34)
Alkaline Phosphatase: 94 U/L (ref 40–150)
BUN: 11.8 mg/dL (ref 7.0–26.0)
CALCIUM: 10.1 mg/dL (ref 8.4–10.4)
CHLORIDE: 104 meq/L (ref 98–109)
CO2: 24 mEq/L (ref 22–29)
Creatinine: 0.8 mg/dL (ref 0.6–1.1)
GLUCOSE: 156 mg/dL — AB (ref 70–140)
Potassium: 4 mEq/L (ref 3.5–5.1)
Sodium: 142 mEq/L (ref 136–145)
Total Bilirubin: 0.5 mg/dL (ref 0.20–1.20)
Total Protein: 7.2 g/dL (ref 6.4–8.3)

## 2014-02-26 LAB — URINALYSIS, MICROSCOPIC - CHCC
BLOOD: NEGATIVE
Bilirubin (Urine): NEGATIVE
Glucose: 2000 mg/dL
Ketones: NEGATIVE mg/dL
NITRITE: POSITIVE
PH: 6 (ref 4.6–8.0)
Protein: NEGATIVE mg/dL
Specific Gravity, Urine: 1.01 (ref 1.003–1.035)
UROBILINOGEN UR: 0.2 mg/dL (ref 0.2–1)

## 2014-02-26 LAB — CBC WITH DIFFERENTIAL/PLATELET
BASO%: 1 % (ref 0.0–2.0)
BASOS ABS: 0.2 10*3/uL — AB (ref 0.0–0.1)
EOS%: 0.1 % (ref 0.0–7.0)
Eosinophils Absolute: 0 10*3/uL (ref 0.0–0.5)
HEMATOCRIT: 26.5 % — AB (ref 34.8–46.6)
HEMOGLOBIN: 8.4 g/dL — AB (ref 11.6–15.9)
LYMPH%: 5.3 % — ABNORMAL LOW (ref 14.0–49.7)
MCH: 29 pg (ref 25.1–34.0)
MCHC: 31.8 g/dL (ref 31.5–36.0)
MCV: 91.4 fL (ref 79.5–101.0)
MONO#: 1.1 10*3/uL — ABNORMAL HIGH (ref 0.1–0.9)
MONO%: 6.2 % (ref 0.0–14.0)
NEUT#: 15.3 10*3/uL — ABNORMAL HIGH (ref 1.5–6.5)
NEUT%: 87.4 % — AB (ref 38.4–76.8)
Platelets: 126 10*3/uL — ABNORMAL LOW (ref 145–400)
RBC: 2.9 10*6/uL — ABNORMAL LOW (ref 3.70–5.45)
RDW: 19.6 % — ABNORMAL HIGH (ref 11.2–14.5)
WBC: 17.5 10*3/uL — ABNORMAL HIGH (ref 3.9–10.3)
lymph#: 0.9 10*3/uL (ref 0.9–3.3)

## 2014-02-26 MED ORDER — CIPROFLOXACIN HCL 500 MG PO TABS: 500.0000 mg | ORAL_TABLET | Freq: Two times a day (BID) | ORAL | Status: DC

## 2014-02-26 MED ORDER — ONDANSETRON HCL 8 MG PO TABS: 8.0000 mg | ORAL_TABLET | Freq: Three times a day (TID) | ORAL | Status: DC | PRN

## 2014-02-26 MED ORDER — FLUCONAZOLE 100 MG PO TABS: 100.0000 mg | ORAL_TABLET | ORAL | Status: DC

## 2014-02-26 NOTE — Addendum Note (Signed)
Addended by: Britt Bottom on: 02/26/2014 01:43 PM   Modules accepted: Orders

## 2014-02-26 NOTE — Progress Notes (Signed)
Pt called c/o back pain, urinary changes (foul smelling, dark, urine), burning with urination.  She also states she has had a yeast infection for 2 weeks even with taking monistat and she is having some questionable thrush in her mouth.  Things taste very sweet and she states it is white.  She is also experiencing some increased nausea not relieved by compazine.  Per Dr Vista Mink, okay to call in zofran to take for nausea, will check UA and U Culture today with lab work today, RN will assess pt's mouth for any thrush.  Pt verbalized understanding.  SLJ

## 2014-02-26 NOTE — Progress Notes (Signed)
Per Dr Vista Mink, pt is positive for a UTI, assessed pt's tongue after she had her lab work today, it is white and looks like she does have oral thrush.  Diflucan and cipro called in.  Pt states she is going to the beach tomorrow.  Okay if she goes to the beach per Dr Vista Mink, she needs to call if her symptoms get worse or do not improve.  Also advised she be aware [place] in case she needs medical attention.

## 2014-02-28 LAB — URINE CULTURE

## 2014-03-05 ENCOUNTER — Ambulatory Visit (HOSPITAL_COMMUNITY)
Admission: RE | Admit: 2014-03-05 | Discharge: 2014-03-05 | Disposition: A | Discharge status: Home / Self Care | Payer: 59 | Source: Ambulatory Visit | Attending: Internal Medicine | Admitting: Internal Medicine

## 2014-03-05 ENCOUNTER — Telehealth: Payer: Self-pay | Admitting: *Deleted

## 2014-03-05 ENCOUNTER — Other Ambulatory Visit: Payer: Self-pay | Admitting: *Deleted

## 2014-03-05 ENCOUNTER — Telehealth: Payer: Self-pay | Admitting: Internal Medicine

## 2014-03-05 ENCOUNTER — Other Ambulatory Visit (HOSPITAL_BASED_OUTPATIENT_CLINIC_OR_DEPARTMENT_OTHER): Payer: 59

## 2014-03-05 ENCOUNTER — Ambulatory Visit (HOSPITAL_BASED_OUTPATIENT_CLINIC_OR_DEPARTMENT_OTHER): Payer: 59

## 2014-03-05 ENCOUNTER — Ambulatory Visit (HOSPITAL_BASED_OUTPATIENT_CLINIC_OR_DEPARTMENT_OTHER): Payer: 59 | Admitting: Nurse Practitioner

## 2014-03-05 VITALS — BP 113/62 | HR 87 | Temp 98.4°F | Resp 18 | Ht 66.0 in | Wt 178.9 lb

## 2014-03-05 VITALS — BP 107/70 | HR 76 | Temp 98.0°F | Resp 16

## 2014-03-05 DIAGNOSIS — T451X5A Adverse effect of antineoplastic and immunosuppressive drugs, initial encounter: Secondary | ICD-10-CM | POA: Diagnosis not present

## 2014-03-05 DIAGNOSIS — D6481 Anemia due to antineoplastic chemotherapy: Secondary | ICD-10-CM | POA: Insufficient documentation

## 2014-03-05 DIAGNOSIS — C3491 Malignant neoplasm of unspecified part of right bronchus or lung: Secondary | ICD-10-CM

## 2014-03-05 DIAGNOSIS — C7931 Secondary malignant neoplasm of brain: Secondary | ICD-10-CM

## 2014-03-05 DIAGNOSIS — C7949 Secondary malignant neoplasm of other parts of nervous system: Secondary | ICD-10-CM

## 2014-03-05 DIAGNOSIS — Z5111 Encounter for antineoplastic chemotherapy: Secondary | ICD-10-CM

## 2014-03-05 DIAGNOSIS — C3410 Malignant neoplasm of upper lobe, unspecified bronchus or lung: Secondary | ICD-10-CM

## 2014-03-05 DIAGNOSIS — C3490 Malignant neoplasm of unspecified part of unspecified bronchus or lung: Secondary | ICD-10-CM

## 2014-03-05 DIAGNOSIS — R0602 Shortness of breath: Secondary | ICD-10-CM

## 2014-03-05 DIAGNOSIS — R5381 Other malaise: Secondary | ICD-10-CM

## 2014-03-05 DIAGNOSIS — C3492 Malignant neoplasm of unspecified part of left bronchus or lung: Secondary | ICD-10-CM

## 2014-03-05 DIAGNOSIS — R110 Nausea: Secondary | ICD-10-CM

## 2014-03-05 DIAGNOSIS — D649 Anemia, unspecified: Secondary | ICD-10-CM

## 2014-03-05 DIAGNOSIS — R5383 Other fatigue: Secondary | ICD-10-CM

## 2014-03-05 LAB — COMPREHENSIVE METABOLIC PANEL (CC13)
ALBUMIN: 3.8 g/dL (ref 3.5–5.0)
ALT: 34 U/L (ref 0–55)
AST: 25 U/L (ref 5–34)
Alkaline Phosphatase: 54 U/L (ref 40–150)
Anion Gap: 9 mEq/L (ref 3–11)
BUN: 13.3 mg/dL (ref 7.0–26.0)
CALCIUM: 9.6 mg/dL (ref 8.4–10.4)
CHLORIDE: 108 meq/L (ref 98–109)
CO2: 25 mEq/L (ref 22–29)
Creatinine: 0.7 mg/dL (ref 0.6–1.1)
Glucose: 107 mg/dl (ref 70–140)
POTASSIUM: 4.1 meq/L (ref 3.5–5.1)
Sodium: 142 mEq/L (ref 136–145)
Total Bilirubin: 0.43 mg/dL (ref 0.20–1.20)
Total Protein: 7 g/dL (ref 6.4–8.3)

## 2014-03-05 LAB — PREPARE RBC (CROSSMATCH)

## 2014-03-05 LAB — CBC WITH DIFFERENTIAL/PLATELET
BASO%: 1 % (ref 0.0–2.0)
Basophils Absolute: 0.1 10*3/uL (ref 0.0–0.1)
EOS ABS: 0 10*3/uL (ref 0.0–0.5)
EOS%: 0.8 % (ref 0.0–7.0)
HCT: 26.2 % — ABNORMAL LOW (ref 34.8–46.6)
HGB: 8 g/dL — ABNORMAL LOW (ref 11.6–15.9)
LYMPH%: 13 % — ABNORMAL LOW (ref 14.0–49.7)
MCH: 29.3 pg (ref 25.1–34.0)
MCHC: 30.5 g/dL — AB (ref 31.5–36.0)
MCV: 96 fL (ref 79.5–101.0)
MONO#: 0.7 10*3/uL (ref 0.1–0.9)
MONO%: 14 % (ref 0.0–14.0)
NEUT%: 71.2 % (ref 38.4–76.8)
NEUTROS ABS: 3.5 10*3/uL (ref 1.5–6.5)
Platelets: 254 10*3/uL (ref 145–400)
RBC: 2.73 10*6/uL — ABNORMAL LOW (ref 3.70–5.45)
RDW: 21 % — ABNORMAL HIGH (ref 11.2–14.5)
WBC: 4.9 10*3/uL (ref 3.9–10.3)
lymph#: 0.6 10*3/uL — ABNORMAL LOW (ref 0.9–3.3)
nRBC: 2 % — ABNORMAL HIGH (ref 0–0)

## 2014-03-05 LAB — HOLD TUBE, BLOOD BANK

## 2014-03-05 LAB — ABO/RH: ABO/RH(D): A POS

## 2014-03-05 MED ORDER — ACETAMINOPHEN 325 MG PO TABS
650.0000 mg | ORAL_TABLET | Freq: Once | ORAL | Status: AC
  Administered 2014-03-05: 650 mg via ORAL

## 2014-03-05 MED ORDER — SODIUM CHLORIDE 0.9 % IJ SOLN
10.0000 mL | INTRAMUSCULAR | Status: DC | PRN
  Administered 2014-03-05: 10 mL
  Filled 2014-03-05: qty 10

## 2014-03-05 MED ORDER — SODIUM CHLORIDE 0.9 % IV SOLN
Freq: Once | INTRAVENOUS | Status: AC
  Administered 2014-03-05: 11:00:00 via INTRAVENOUS

## 2014-03-05 MED ORDER — HEPARIN SOD (PORK) LOCK FLUSH 100 UNIT/ML IV SOLN
500.0000 [IU] | Freq: Once | INTRAVENOUS | Status: AC | PRN
  Administered 2014-03-05: 500 [IU]
  Filled 2014-03-05: qty 5

## 2014-03-05 MED ORDER — DEXAMETHASONE SODIUM PHOSPHATE 20 MG/5ML IJ SOLN
INTRAMUSCULAR | Status: AC
  Filled 2014-03-05: qty 5

## 2014-03-05 MED ORDER — ONDANSETRON 16 MG/50ML IVPB (CHCC)
INTRAVENOUS | Status: AC
  Filled 2014-03-05: qty 16

## 2014-03-05 MED ORDER — DIPHENHYDRAMINE HCL 25 MG PO CAPS
ORAL_CAPSULE | ORAL | Status: AC
  Filled 2014-03-05: qty 1

## 2014-03-05 MED ORDER — ONDANSETRON 16 MG/50ML IVPB (CHCC)
16.0000 mg | Freq: Once | INTRAVENOUS | Status: AC
  Administered 2014-03-05: 16 mg via INTRAVENOUS

## 2014-03-05 MED ORDER — DIPHENHYDRAMINE HCL 25 MG PO CAPS
25.0000 mg | ORAL_CAPSULE | Freq: Once | ORAL | Status: AC
  Administered 2014-03-05: 25 mg via ORAL

## 2014-03-05 MED ORDER — ETOPOSIDE CHEMO INJECTION 1 GM/50ML
120.0000 mg/m2 | Freq: Once | INTRAVENOUS | Status: AC
  Administered 2014-03-05: 240 mg via INTRAVENOUS
  Filled 2014-03-05: qty 12

## 2014-03-05 MED ORDER — DEXAMETHASONE SODIUM PHOSPHATE 20 MG/5ML IJ SOLN
20.0000 mg | Freq: Once | INTRAMUSCULAR | Status: AC
  Administered 2014-03-05: 20 mg via INTRAVENOUS

## 2014-03-05 MED ORDER — ACETAMINOPHEN 325 MG PO TABS
ORAL_TABLET | ORAL | Status: AC
  Filled 2014-03-05: qty 2

## 2014-03-05 MED ORDER — SODIUM CHLORIDE 0.9 % IV SOLN
650.0000 mg | Freq: Once | INTRAVENOUS | Status: AC
  Administered 2014-03-05: 650 mg via INTRAVENOUS
  Filled 2014-03-05: qty 65

## 2014-03-05 NOTE — Progress Notes (Signed)
Hgb: 8.0. Ned Card instructed ok to treat. Will receive blood later this week.

## 2014-03-05 NOTE — Telephone Encounter (Signed)
gv and printed appt sched and avs for pt for July and Aug...sed adn MW addecd tx.

## 2014-03-05 NOTE — Telephone Encounter (Signed)
gv adn printed appt sched and avs for pt for July...sed added tx....emailed MW to add adjust tx for 7.9 and 7.10

## 2014-03-05 NOTE — Patient Instructions (Signed)
[facility] Discharge Instructions for Patients Receiving Chemotherapy  Today you received the following chemotherapy agents Carboplatin and Etoposide.   To help prevent nausea and vomiting after your treatment, we encourage you to take your nausea medication as directed.    If you develop nausea and vomiting that is not controlled by your nausea medication, call the clinic.   BELOW ARE SYMPTOMS THAT SHOULD BE REPORTED IMMEDIATELY:  *FEVER GREATER THAN 100.5 F  *CHILLS WITH OR WITHOUT FEVER  NAUSEA AND VOMITING THAT IS NOT CONTROLLED WITH YOUR NAUSEA MEDICATION  *UNUSUAL SHORTNESS OF BREATH  *UNUSUAL BRUISING OR BLEEDING  TENDERNESS IN MOUTH AND THROAT WITH OR WITHOUT PRESENCE OF ULCERS  *URINARY PROBLEMS  *BOWEL PROBLEMS  UNUSUAL RASH Items with * indicate a potential emergency and should be followed up as soon as possible.  Feel free to call the clinic you have any questions or concerns. The clinic phone number is [phone].  Blood Transfusion Information WHAT IS A BLOOD TRANSFUSION? A transfusion is the replacement of blood or some of its parts. Blood is made up of multiple cells which provide different functions.  Red blood cells carry oxygen and are used for blood loss replacement.  White blood cells fight against infection.  Platelets control bleeding.  Plasma helps clot blood.  Other blood products are available for specialized needs, such as hemophilia or other clotting disorders. BEFORE THE TRANSFUSION  Who gives blood for transfusions?   You may be able to donate blood to be used at a later date on yourself (autologous donation).  Relatives can be asked to donate blood. This is generally not any safer than if you have received blood from a stranger. The same precautions are taken to ensure safety when a relative's blood is donated.  Healthy volunteers who are fully evaluated to make sure their blood is safe. This is blood bank  blood. Transfusion therapy is the safest it has ever been in the practice of medicine. Before blood is taken from a donor, a complete history is taken to make sure that person has no history of diseases nor engages in risky social behavior (examples are intravenous drug use or sexual activity with multiple partners). The donor's travel history is screened to minimize risk of transmitting infections, such as malaria. The donated blood is tested for signs of infectious diseases, such as HIV and hepatitis. The blood is then tested to be sure it is compatible with you in order to minimize the chance of a transfusion reaction. If you or a relative donates blood, this is often done in anticipation of surgery and is not appropriate for emergency situations. It takes many days to process the donated blood. RISKS AND COMPLICATIONS Although transfusion therapy is very safe and saves many lives, the main dangers of transfusion include:   Getting an infectious disease.  Developing a transfusion reaction. This is an allergic reaction to something in the blood you were given. Every precaution is taken to prevent this. The decision to have a blood transfusion has been considered carefully by your caregiver before blood is given. Blood is not given unless the benefits outweigh the risks. AFTER THE TRANSFUSION  Right after receiving a blood transfusion, you will usually feel much better and more energetic. This is especially true if your red blood cells have gotten low (anemic). The transfusion raises the level of the red blood cells which carry oxygen, and this usually causes an energy increase.  The nurse administering the transfusion will  monitor you carefully for complications. HOME CARE INSTRUCTIONS  No special instructions are needed after a transfusion. You may find your energy is better. Speak with your caregiver about any limitations on activity for underlying diseases you may have. SEEK MEDICAL CARE IF:    Your condition is not improving after your transfusion.  You develop redness or irritation at the intravenous (IV) site. SEEK IMMEDIATE MEDICAL CARE IF:  Any of the following symptoms occur over the next 12 hours:  Shaking chills.  You have a temperature by mouth above 102 F (38.9 C), not controlled by medicine.  Chest, back, or muscle pain.  People around you feel you are not acting correctly or are confused.  Shortness of breath or difficulty breathing.  Dizziness and fainting.  You get a rash or develop hives.  You have a decrease in urine output.  Your urine turns a dark color or changes to pink, red, or brown. Any of the following symptoms occur over the next 10 days:  You have a temperature by mouth above 102 F (38.9 C), not controlled by medicine.  Shortness of breath.  Weakness after normal activity.  The white part of the eye turns yellow (jaundice).  You have a decrease in the amount of urine or are urinating less often.  Your urine turns a dark color or changes to pink, red, or brown. Document Released: 08/12/2000 Document Revised: 11/07/2011 Document Reviewed: 03/31/2008 [facility] Patient Information 2015 [place], [place]. This information is not intended to replace advice given to you by your health care provider. Make sure you discuss any questions you have with your health care provider.

## 2014-03-05 NOTE — Telephone Encounter (Signed)
Per staff message and POF I have scheduled appts. Advised scheduler of appts. JMW  

## 2014-03-05 NOTE — Progress Notes (Addendum)
[facility] OFFICE PROGRESS NOTE   Diagnosis: Extensive stage small cell lung cancer.   INTERVAL HISTORY:   Carol Bond returns as scheduled. She completed cycle 3 carboplatin/etoposide beginning 02/12/2014. She noted increased nausea on day 3. She has Zofran and Compazine to take as needed. No vomiting. She denies mouth sores. No diarrhea. No skin rash. She notes shortness of breath when she walks a long distance. She has a slight cough but notes that it is markedly improved as compared to pretreatment. No fever. Appetite is diminished due to the nausea. She thinks her weight is fairly stable. Main complaint is fatigue. She denies any bleeding. While on vacation last week she had several episodes of palpitations. No chest pain.  Objective:  Vital signs in last 24 hours:  Blood pressure 113/62, pulse 87, temperature 98.4 F (36.9 C), temperature source Oral, resp. rate 18, height 5\' 6"  (1.676 m), weight 178 lb 14.4 oz (81.149 kg).    HEENT: No thrush or ulcerations. Resp: Lungs are clear. Breath sounds are diminished at the left base. Cardio: Regular cardiac rhythm. Question faint systolic murmur. GI: Abdomen soft and nontender. No organomegaly. No mass. Vascular: No leg edema. Calves soft and nontender. Skin: No rash.  Port-A-Cath site without erythema.    Lab Results:  Lab Results  Component Value Date   WBC 4.9 03/05/2014   HGB 8.0* 03/05/2014   HCT 26.2* 03/05/2014   MCV 96.0 03/05/2014   PLT 254 03/05/2014   NEUTROABS 3.5 03/05/2014    Imaging:  No results found.  Medications: I have reviewed the patient's current medications.  Assessment/Plan: 1. Extensive stage small cell lung cancer diagnosed April 2015. Status post left central chest radiation. Treatment initiated with carboplatin/etoposide 01/01/2014. Restaging CT evaluation after 2 cycles showed improvement in the left upper lobe mass. Cycle 3 carboplatin/etoposide completed 02/12/2014. 2. Status post  whole brain radiation for brain metastases 12/18/2013 through 12/31/2013. 3. Anemia. Progressive.   Disposition: Carol Bond has completed 3 cycles of carboplatin/etoposide. Plan to proceed with cycle 4 today as scheduled. She will again receive Neulasta support.  She reports significant nausea with the previous cycles of chemotherapy. She will contact the office if Zofran and Compazine are not effective following cycle 4.  She has progressive anemia and is now symptomatic. She will be transfused 2 units of blood this week.  Dr. Julien Nordmann recommends restaging CT scans after cycle 4. The scans will be scheduled 03/24/2014. She will return for a followup visit with Dr. Julien Nordmann on 03/26/2014.  She will contact the office prior to her next visit as outlined above or with any other problems.  Patient seen with Dr. Julien Nordmann.    Ned Card ANP/GNP-BC   03/05/2014  9:48 AM  ADDENDUM:  Hematology/Oncology Attending:  I had a face to face encounter with the patient today. I recommended her care plan. This is a very pleasant 58 years old white female with extensive stage small cell lung cancer currently undergoing systemic chemotherapy with carboplatin and paclitaxel is status post 3 cycles. The patient is doing fine today with no specific complaints except for mild fatigue secondary to chemotherapy-induced anemia. I recommended for the patient to proceed with cycle number for today as scheduled. She will come back for followup visit in 3 weeks after repeating CT scan of the chest, abdomen and pelvis for restaging of her disease. For the chemotherapy-induced anemia, I will arrange for the patient received 2 units of PRBCs transfusion. She was advised to call  immediately if she has any concerning symptoms in the interval.  Disclaimer: This note was dictated with voice recognition software. Similar sounding words can inadvertently be transcribed and may not be corrected upon review. Eilleen Kempf., MD 03/05/2014

## 2014-03-06 ENCOUNTER — Ambulatory Visit (HOSPITAL_BASED_OUTPATIENT_CLINIC_OR_DEPARTMENT_OTHER): Payer: 59

## 2014-03-06 VITALS — BP 108/63 | HR 72 | Temp 98.1°F | Resp 16

## 2014-03-06 DIAGNOSIS — Z5111 Encounter for antineoplastic chemotherapy: Secondary | ICD-10-CM

## 2014-03-06 DIAGNOSIS — C3410 Malignant neoplasm of upper lobe, unspecified bronchus or lung: Secondary | ICD-10-CM

## 2014-03-06 DIAGNOSIS — D6481 Anemia due to antineoplastic chemotherapy: Secondary | ICD-10-CM

## 2014-03-06 DIAGNOSIS — C7931 Secondary malignant neoplasm of brain: Secondary | ICD-10-CM

## 2014-03-06 DIAGNOSIS — T451X5A Adverse effect of antineoplastic and immunosuppressive drugs, initial encounter: Secondary | ICD-10-CM

## 2014-03-06 DIAGNOSIS — C3491 Malignant neoplasm of unspecified part of right bronchus or lung: Secondary | ICD-10-CM

## 2014-03-06 DIAGNOSIS — C7949 Secondary malignant neoplasm of other parts of nervous system: Secondary | ICD-10-CM

## 2014-03-06 MED ORDER — DIPHENHYDRAMINE HCL 25 MG PO CAPS
ORAL_CAPSULE | ORAL | Status: AC
  Filled 2014-03-06: qty 1

## 2014-03-06 MED ORDER — HEPARIN SOD (PORK) LOCK FLUSH 100 UNIT/ML IV SOLN
500.0000 [IU] | Freq: Once | INTRAVENOUS | Status: AC | PRN
  Administered 2014-03-06: 500 [IU]
  Filled 2014-03-06: qty 5

## 2014-03-06 MED ORDER — SODIUM CHLORIDE 0.9 % IV SOLN
Freq: Once | INTRAVENOUS | Status: AC
  Administered 2014-03-06: 13:00:00 via INTRAVENOUS

## 2014-03-06 MED ORDER — SODIUM CHLORIDE 0.9 % IJ SOLN
10.0000 mL | INTRAMUSCULAR | Status: DC | PRN
  Administered 2014-03-06: 10 mL
  Filled 2014-03-06: qty 10

## 2014-03-06 MED ORDER — DIPHENHYDRAMINE HCL 25 MG PO CAPS
25.0000 mg | ORAL_CAPSULE | Freq: Once | ORAL | Status: AC
  Administered 2014-03-06: 25 mg via ORAL

## 2014-03-06 MED ORDER — DEXAMETHASONE SODIUM PHOSPHATE 10 MG/ML IJ SOLN
10.0000 mg | Freq: Once | INTRAMUSCULAR | Status: AC
Start: 2014-03-06 — End: 2014-03-06
  Administered 2014-03-06: 10 mg via INTRAVENOUS

## 2014-03-06 MED ORDER — ACETAMINOPHEN 325 MG PO TABS
650.0000 mg | ORAL_TABLET | Freq: Once | ORAL | Status: AC
  Administered 2014-03-06: 650 mg via ORAL

## 2014-03-06 MED ORDER — ONDANSETRON 8 MG/50ML IVPB (CHCC)
8.0000 mg | Freq: Once | INTRAVENOUS | Status: AC
Start: 2014-03-06 — End: 2014-03-06
  Administered 2014-03-06: 8 mg via INTRAVENOUS

## 2014-03-06 MED ORDER — SODIUM CHLORIDE 0.9 % IV SOLN
120.0000 mg/m2 | Freq: Once | INTRAVENOUS | Status: AC
  Administered 2014-03-06: 240 mg via INTRAVENOUS
  Filled 2014-03-06: qty 12

## 2014-03-06 MED ORDER — SODIUM CHLORIDE 0.9 % IV SOLN
250.0000 mL | Freq: Once | INTRAVENOUS | Status: AC
  Administered 2014-03-06: 250 mL via INTRAVENOUS

## 2014-03-06 MED ORDER — ACETAMINOPHEN 325 MG PO TABS
ORAL_TABLET | ORAL | Status: AC
  Filled 2014-03-06: qty 2

## 2014-03-06 NOTE — Patient Instructions (Signed)
[facility] Discharge Instructions for Patients Receiving Chemotherapy  Today you received the following chemotherapy agents Etoposide.  To help prevent nausea and vomiting after your treatment, we encourage you to take your nausea medication as prescribed.   If you develop nausea and vomiting that is not controlled by your nausea medication, call the clinic.   BELOW ARE SYMPTOMS THAT SHOULD BE REPORTED IMMEDIATELY:  *FEVER GREATER THAN 100.5 F  *CHILLS WITH OR WITHOUT FEVER  NAUSEA AND VOMITING THAT IS NOT CONTROLLED WITH YOUR NAUSEA MEDICATION  *UNUSUAL SHORTNESS OF BREATH  *UNUSUAL BRUISING OR BLEEDING  TENDERNESS IN MOUTH AND THROAT WITH OR WITHOUT PRESENCE OF ULCERS  *URINARY PROBLEMS  *BOWEL PROBLEMS  UNUSUAL RASH Items with * indicate a potential emergency and should be followed up as soon as possible.  Feel free to call the clinic you have any questions or concerns. The clinic phone number is [phone].    Blood Transfusion Information WHAT IS A BLOOD TRANSFUSION? A transfusion is the replacement of blood or some of its parts. Blood is made up of multiple cells which provide different functions.  Red blood cells carry oxygen and are used for blood loss replacement.  White blood cells fight against infection.  Platelets control bleeding.  Plasma helps clot blood.  Other blood products are available for specialized needs, such as hemophilia or other clotting disorders. BEFORE THE TRANSFUSION  Who gives blood for transfusions?   You may be able to donate blood to be used at a later date on yourself (autologous donation).  Relatives can be asked to donate blood. This is generally not any safer than if you have received blood from a stranger. The same precautions are taken to ensure safety when a relative's blood is donated.  Healthy volunteers who are fully evaluated to make sure their blood is safe. This is blood bank blood. Transfusion  therapy is the safest it has ever been in the practice of medicine. Before blood is taken from a donor, a complete history is taken to make sure that person has no history of diseases nor engages in risky social behavior (examples are intravenous drug use or sexual activity with multiple partners). The donor's travel history is screened to minimize risk of transmitting infections, such as malaria. The donated blood is tested for signs of infectious diseases, such as HIV and hepatitis. The blood is then tested to be sure it is compatible with you in order to minimize the chance of a transfusion reaction. If you or a relative donates blood, this is often done in anticipation of surgery and is not appropriate for emergency situations. It takes many days to process the donated blood. RISKS AND COMPLICATIONS Although transfusion therapy is very safe and saves many lives, the main dangers of transfusion include:   Getting an infectious disease.  Developing a transfusion reaction. This is an allergic reaction to something in the blood you were given. Every precaution is taken to prevent this. The decision to have a blood transfusion has been considered carefully by your caregiver before blood is given. Blood is not given unless the benefits outweigh the risks. AFTER THE TRANSFUSION  Right after receiving a blood transfusion, you will usually feel much better and more energetic. This is especially true if your red blood cells have gotten low (anemic). The transfusion raises the level of the red blood cells which carry oxygen, and this usually causes an energy increase.  The nurse administering the transfusion will monitor you  carefully for complications. HOME CARE INSTRUCTIONS  No special instructions are needed after a transfusion. You may find your energy is better. Speak with your caregiver about any limitations on activity for underlying diseases you may have. SEEK MEDICAL CARE IF:   Your condition is  not improving after your transfusion.  You develop redness or irritation at the intravenous (IV) site. SEEK IMMEDIATE MEDICAL CARE IF:  Any of the following symptoms occur over the next 12 hours:  Shaking chills.  You have a temperature by mouth above 102 F (38.9 C), not controlled by medicine.  Chest, back, or muscle pain.  People around you feel you are not acting correctly or are confused.  Shortness of breath or difficulty breathing.  Dizziness and fainting.  You get a rash or develop hives.  You have a decrease in urine output.  Your urine turns a dark color or changes to pink, red, or brown. Any of the following symptoms occur over the next 10 days:  You have a temperature by mouth above 102 F (38.9 C), not controlled by medicine.  Shortness of breath.  Weakness after normal activity.  The white part of the eye turns yellow (jaundice).  You have a decrease in the amount of urine or are urinating less often.  Your urine turns a dark color or changes to pink, red, or brown. Document Released: 08/12/2000 Document Revised: 11/07/2011 Document Reviewed: 03/31/2008 [facility] Patient Information 2015 [place], [place]. This information is not intended to replace advice given to you by your health care provider. Make sure you discuss any questions you have with your health care provider.

## 2014-03-07 ENCOUNTER — Ambulatory Visit (HOSPITAL_BASED_OUTPATIENT_CLINIC_OR_DEPARTMENT_OTHER): Payer: 59

## 2014-03-07 VITALS — BP 101/58 | HR 75 | Temp 98.0°F | Resp 16

## 2014-03-07 DIAGNOSIS — C3491 Malignant neoplasm of unspecified part of right bronchus or lung: Secondary | ICD-10-CM

## 2014-03-07 DIAGNOSIS — C3410 Malignant neoplasm of upper lobe, unspecified bronchus or lung: Secondary | ICD-10-CM

## 2014-03-07 DIAGNOSIS — C7949 Secondary malignant neoplasm of other parts of nervous system: Secondary | ICD-10-CM

## 2014-03-07 DIAGNOSIS — Z5111 Encounter for antineoplastic chemotherapy: Secondary | ICD-10-CM

## 2014-03-07 DIAGNOSIS — C7931 Secondary malignant neoplasm of brain: Secondary | ICD-10-CM

## 2014-03-07 LAB — TYPE AND SCREEN
ABO/RH(D): A POS
ANTIBODY SCREEN: NEGATIVE
UNIT DIVISION: 0
Unit division: 0

## 2014-03-07 MED ORDER — ONDANSETRON 8 MG/NS 50 ML IVPB
INTRAVENOUS | Status: AC
  Filled 2014-03-07: qty 8

## 2014-03-07 MED ORDER — DEXAMETHASONE SODIUM PHOSPHATE 10 MG/ML IJ SOLN
INTRAMUSCULAR | Status: AC
Start: 2014-03-07 — End: 2014-03-07
  Filled 2014-03-07: qty 1

## 2014-03-07 MED ORDER — ONDANSETRON 8 MG/50ML IVPB (CHCC)
8.0000 mg | Freq: Once | INTRAVENOUS | Status: AC
  Administered 2014-03-07: 8 mg via INTRAVENOUS

## 2014-03-07 MED ORDER — SODIUM CHLORIDE 0.9 % IV SOLN
120.0000 mg/m2 | Freq: Once | INTRAVENOUS | Status: AC
  Administered 2014-03-07: 240 mg via INTRAVENOUS
  Filled 2014-03-07: qty 12

## 2014-03-07 MED ORDER — SODIUM CHLORIDE 0.9 % IV SOLN
Freq: Once | INTRAVENOUS | Status: AC
  Administered 2014-03-07: 13:00:00 via INTRAVENOUS

## 2014-03-07 MED ORDER — SODIUM CHLORIDE 0.9 % IJ SOLN
10.0000 mL | INTRAMUSCULAR | Status: DC | PRN
  Administered 2014-03-07: 10 mL
  Filled 2014-03-07: qty 10

## 2014-03-07 MED ORDER — HEPARIN SOD (PORK) LOCK FLUSH 100 UNIT/ML IV SOLN
500.0000 [IU] | Freq: Once | INTRAVENOUS | Status: AC | PRN
  Administered 2014-03-07: 500 [IU]
  Filled 2014-03-07: qty 5

## 2014-03-07 MED ORDER — DEXAMETHASONE SODIUM PHOSPHATE 10 MG/ML IJ SOLN
10.0000 mg | Freq: Once | INTRAMUSCULAR | Status: AC
  Administered 2014-03-07: 10 mg via INTRAVENOUS

## 2014-03-07 NOTE — Patient Instructions (Signed)
[facility] Discharge Instructions for Patients Receiving Chemotherapy  Today you received the following chemotherapy agents etoposide or VP-16.  To help prevent nausea and vomiting after your treatment, we encourage you to take your nausea medication zofran or compazine.   If you develop nausea and vomiting that is not controlled by your nausea medication, call the clinic.   BELOW ARE SYMPTOMS THAT SHOULD BE REPORTED IMMEDIATELY:  *FEVER GREATER THAN 100.5 F  *CHILLS WITH OR WITHOUT FEVER  NAUSEA AND VOMITING THAT IS NOT CONTROLLED WITH YOUR NAUSEA MEDICATION  *UNUSUAL SHORTNESS OF BREATH  *UNUSUAL BRUISING OR BLEEDING  TENDERNESS IN MOUTH AND THROAT WITH OR WITHOUT PRESENCE OF ULCERS  *URINARY PROBLEMS  *BOWEL PROBLEMS  UNUSUAL RASH Items with * indicate a potential emergency and should be followed up as soon as possible.  Feel free to call the clinic you have any questions or concerns. The clinic phone number is [phone].

## 2014-03-08 ENCOUNTER — Ambulatory Visit (HOSPITAL_BASED_OUTPATIENT_CLINIC_OR_DEPARTMENT_OTHER): Payer: 59

## 2014-03-08 VITALS — BP 112/68 | HR 74 | Temp 98.8°F | Resp 18

## 2014-03-08 DIAGNOSIS — C7949 Secondary malignant neoplasm of other parts of nervous system: Secondary | ICD-10-CM

## 2014-03-08 DIAGNOSIS — C7931 Secondary malignant neoplasm of brain: Secondary | ICD-10-CM

## 2014-03-08 DIAGNOSIS — C3410 Malignant neoplasm of upper lobe, unspecified bronchus or lung: Secondary | ICD-10-CM

## 2014-03-08 DIAGNOSIS — Z5189 Encounter for other specified aftercare: Secondary | ICD-10-CM

## 2014-03-08 MED ORDER — PEGFILGRASTIM INJECTION 6 MG/0.6ML
6.0000 mg | Freq: Once | SUBCUTANEOUS | Status: AC
  Administered 2014-03-08: 6 mg via SUBCUTANEOUS

## 2014-03-10 ENCOUNTER — Other Ambulatory Visit: Payer: Self-pay | Admitting: *Deleted

## 2014-03-10 ENCOUNTER — Telehealth: Payer: Self-pay | Admitting: *Deleted

## 2014-03-10 DIAGNOSIS — C3491 Malignant neoplasm of unspecified part of right bronchus or lung: Secondary | ICD-10-CM

## 2014-03-10 NOTE — Telephone Encounter (Signed)
Called and informed pt that she does need to have contrast for her CT scans.  SLJ

## 2014-03-10 NOTE — Telephone Encounter (Signed)
Message copied by Britt Bottom on Mon Mar 10, 2014 10:58 AM ------      Message from: Jaci Carrel A      Created: Sat Mar 08, 2014 10:11 AM      Regarding: Contrast??? CT chest on 7/22       Patient has CT chest on 7/22; patient asking if she needs contrast prior.... Informed her that she is to be NPO 4 hours prior. Patient to return for labs on 7/15. Please inform her if she does need the contrast. Thanks       ------

## 2014-03-12 ENCOUNTER — Other Ambulatory Visit: Payer: 59

## 2014-03-12 ENCOUNTER — Other Ambulatory Visit (HOSPITAL_BASED_OUTPATIENT_CLINIC_OR_DEPARTMENT_OTHER): Payer: 59

## 2014-03-12 DIAGNOSIS — C3410 Malignant neoplasm of upper lobe, unspecified bronchus or lung: Secondary | ICD-10-CM

## 2014-03-12 DIAGNOSIS — C3490 Malignant neoplasm of unspecified part of unspecified bronchus or lung: Secondary | ICD-10-CM

## 2014-03-12 LAB — CBC WITH DIFFERENTIAL/PLATELET
BASO%: 0.6 % (ref 0.0–2.0)
BASOS ABS: 0 10*3/uL (ref 0.0–0.1)
EOS%: 0.4 % (ref 0.0–7.0)
Eosinophils Absolute: 0 10*3/uL (ref 0.0–0.5)
HEMATOCRIT: 32.1 % — AB (ref 34.8–46.6)
HGB: 10.4 g/dL — ABNORMAL LOW (ref 11.6–15.9)
LYMPH#: 0.4 10*3/uL — AB (ref 0.9–3.3)
LYMPH%: 8.4 % — AB (ref 14.0–49.7)
MCH: 29.7 pg (ref 25.1–34.0)
MCHC: 32.5 g/dL (ref 31.5–36.0)
MCV: 91.2 fL (ref 79.5–101.0)
MONO#: 0.1 10*3/uL (ref 0.1–0.9)
MONO%: 2.1 % (ref 0.0–14.0)
NEUT#: 4 10*3/uL (ref 1.5–6.5)
NEUT%: 88.5 % — AB (ref 38.4–76.8)
Platelets: 121 10*3/uL — ABNORMAL LOW (ref 145–400)
RBC: 3.52 10*6/uL — ABNORMAL LOW (ref 3.70–5.45)
RDW: 17.8 % — ABNORMAL HIGH (ref 11.2–14.5)
WBC: 4.5 10*3/uL (ref 3.9–10.3)

## 2014-03-12 LAB — COMPREHENSIVE METABOLIC PANEL (CC13)
ALT: 49 U/L (ref 0–55)
AST: 28 U/L (ref 5–34)
Albumin: 4 g/dL (ref 3.5–5.0)
Alkaline Phosphatase: 73 U/L (ref 40–150)
Anion Gap: 11 mEq/L (ref 3–11)
BILIRUBIN TOTAL: 0.52 mg/dL (ref 0.20–1.20)
BUN: 19.2 mg/dL (ref 7.0–26.0)
CALCIUM: 10.2 mg/dL (ref 8.4–10.4)
CO2: 23 mEq/L (ref 22–29)
CREATININE: 0.7 mg/dL (ref 0.6–1.1)
Chloride: 106 mEq/L (ref 98–109)
Glucose: 125 mg/dl (ref 70–140)
Potassium: 4.4 mEq/L (ref 3.5–5.1)
Sodium: 141 mEq/L (ref 136–145)
Total Protein: 7 g/dL (ref 6.4–8.3)

## 2014-03-18 ENCOUNTER — Telehealth: Payer: Self-pay | Admitting: *Deleted

## 2014-03-18 NOTE — Telephone Encounter (Signed)
Pt called stating she had has a h/a daily x 3 days, it is throbbing behind her eyes.  She has a rx for oxycodone that she has been taking for the pain and it will relieve the pain but it returns.  She takes 1-2 tabs of the oxy daily.  No visual changes, no new trouble balancing or memory changes.  Per Dr Vista Mink, pt had MRI of the brain 6/15, it was negative.  If symptoms do not go away or get worse, he may consider another MRI of the brain.  Called and left a message informing pt.  SLJ

## 2014-03-19 ENCOUNTER — Ambulatory Visit (HOSPITAL_COMMUNITY)
Admission: RE | Admit: 2014-03-19 | Discharge: 2014-03-19 | Disposition: A | Discharge status: Home / Self Care | Payer: 59 | Source: Ambulatory Visit | Attending: Nurse Practitioner | Admitting: Nurse Practitioner

## 2014-03-19 ENCOUNTER — Encounter (HOSPITAL_COMMUNITY): Payer: Self-pay

## 2014-03-19 ENCOUNTER — Other Ambulatory Visit (HOSPITAL_BASED_OUTPATIENT_CLINIC_OR_DEPARTMENT_OTHER): Payer: 59

## 2014-03-19 DIAGNOSIS — M949 Disorder of cartilage, unspecified: Secondary | ICD-10-CM

## 2014-03-19 DIAGNOSIS — M899 Disorder of bone, unspecified: Secondary | ICD-10-CM | POA: Insufficient documentation

## 2014-03-19 DIAGNOSIS — C3490 Malignant neoplasm of unspecified part of unspecified bronchus or lung: Secondary | ICD-10-CM

## 2014-03-19 DIAGNOSIS — Z9221 Personal history of antineoplastic chemotherapy: Secondary | ICD-10-CM | POA: Insufficient documentation

## 2014-03-19 DIAGNOSIS — K7689 Other specified diseases of liver: Secondary | ICD-10-CM | POA: Insufficient documentation

## 2014-03-19 DIAGNOSIS — D1809 Hemangioma of other sites: Secondary | ICD-10-CM | POA: Insufficient documentation

## 2014-03-19 DIAGNOSIS — C3492 Malignant neoplasm of unspecified part of left bronchus or lung: Secondary | ICD-10-CM

## 2014-03-19 DIAGNOSIS — C7970 Secondary malignant neoplasm of unspecified adrenal gland: Secondary | ICD-10-CM | POA: Insufficient documentation

## 2014-03-19 DIAGNOSIS — C3410 Malignant neoplasm of upper lobe, unspecified bronchus or lung: Secondary | ICD-10-CM

## 2014-03-19 DIAGNOSIS — Z923 Personal history of irradiation: Secondary | ICD-10-CM | POA: Insufficient documentation

## 2014-03-19 DIAGNOSIS — R222 Localized swelling, mass and lump, trunk: Secondary | ICD-10-CM | POA: Insufficient documentation

## 2014-03-19 LAB — COMPREHENSIVE METABOLIC PANEL (CC13)
ALBUMIN: 4.1 g/dL (ref 3.5–5.0)
ALT: 63 U/L — ABNORMAL HIGH (ref 0–55)
AST: 32 U/L (ref 5–34)
Alkaline Phosphatase: 62 U/L (ref 40–150)
Anion Gap: 10 mEq/L (ref 3–11)
BUN: 13.8 mg/dL (ref 7.0–26.0)
CALCIUM: 9.8 mg/dL (ref 8.4–10.4)
CO2: 26 mEq/L (ref 22–29)
Chloride: 108 mEq/L (ref 98–109)
Creatinine: 0.7 mg/dL (ref 0.6–1.1)
GLUCOSE: 111 mg/dL (ref 70–140)
POTASSIUM: 4.2 meq/L (ref 3.5–5.1)
SODIUM: 145 meq/L (ref 136–145)
TOTAL PROTEIN: 7 g/dL (ref 6.4–8.3)
Total Bilirubin: 0.45 mg/dL (ref 0.20–1.20)

## 2014-03-19 LAB — CBC WITH DIFFERENTIAL/PLATELET
BASO%: 0.9 % (ref 0.0–2.0)
Basophils Absolute: 0.1 10*3/uL (ref 0.0–0.1)
EOS ABS: 0 10*3/uL (ref 0.0–0.5)
EOS%: 0.4 % (ref 0.0–7.0)
HEMATOCRIT: 27.7 % — AB (ref 34.8–46.6)
HEMOGLOBIN: 9 g/dL — AB (ref 11.6–15.9)
LYMPH%: 10.6 % — ABNORMAL LOW (ref 14.0–49.7)
MCH: 29.6 pg (ref 25.1–34.0)
MCHC: 32.5 g/dL (ref 31.5–36.0)
MCV: 91.1 fL (ref 79.5–101.0)
MONO#: 0.7 10*3/uL (ref 0.1–0.9)
MONO%: 8.9 % (ref 0.0–14.0)
NEUT#: 6.1 10*3/uL (ref 1.5–6.5)
NEUT%: 79.2 % — ABNORMAL HIGH (ref 38.4–76.8)
Platelets: 38 10*3/uL — ABNORMAL LOW (ref 145–400)
RBC: 3.04 10*6/uL — ABNORMAL LOW (ref 3.70–5.45)
RDW: 16.5 % — AB (ref 11.2–14.5)
WBC: 7.8 10*3/uL (ref 3.9–10.3)
lymph#: 0.8 10*3/uL — ABNORMAL LOW (ref 0.9–3.3)
nRBC: 0 % (ref 0–0)

## 2014-03-19 MED ORDER — IOHEXOL 300 MG/ML  SOLN
100.0000 mL | Freq: Once | INTRAMUSCULAR | Status: AC | PRN
  Administered 2014-03-19: 100 mL via INTRAVENOUS

## 2014-03-19 MED ORDER — IOHEXOL 300 MG/ML  SOLN: 50.0000 mL | Freq: Once | INTRAMUSCULAR | Status: AC | PRN

## 2014-03-26 ENCOUNTER — Ambulatory Visit (HOSPITAL_BASED_OUTPATIENT_CLINIC_OR_DEPARTMENT_OTHER): Payer: 59 | Admitting: Internal Medicine

## 2014-03-26 ENCOUNTER — Other Ambulatory Visit (HOSPITAL_BASED_OUTPATIENT_CLINIC_OR_DEPARTMENT_OTHER): Payer: 59

## 2014-03-26 ENCOUNTER — Telehealth: Payer: Self-pay | Admitting: Medical Oncology

## 2014-03-26 ENCOUNTER — Encounter: Payer: Self-pay | Admitting: Internal Medicine

## 2014-03-26 ENCOUNTER — Ambulatory Visit (HOSPITAL_BASED_OUTPATIENT_CLINIC_OR_DEPARTMENT_OTHER): Payer: 59

## 2014-03-26 VITALS — BP 113/62 | HR 86 | Temp 98.0°F | Resp 18 | Ht 66.0 in | Wt 175.7 lb

## 2014-03-26 DIAGNOSIS — C7949 Secondary malignant neoplasm of other parts of nervous system: Secondary | ICD-10-CM

## 2014-03-26 DIAGNOSIS — C250 Malignant neoplasm of head of pancreas: Secondary | ICD-10-CM

## 2014-03-26 DIAGNOSIS — C7931 Secondary malignant neoplasm of brain: Secondary | ICD-10-CM

## 2014-03-26 DIAGNOSIS — C3410 Malignant neoplasm of upper lobe, unspecified bronchus or lung: Secondary | ICD-10-CM

## 2014-03-26 DIAGNOSIS — E876 Hypokalemia: Secondary | ICD-10-CM

## 2014-03-26 DIAGNOSIS — C3490 Malignant neoplasm of unspecified part of unspecified bronchus or lung: Secondary | ICD-10-CM

## 2014-03-26 DIAGNOSIS — Z5111 Encounter for antineoplastic chemotherapy: Secondary | ICD-10-CM

## 2014-03-26 DIAGNOSIS — R110 Nausea: Secondary | ICD-10-CM

## 2014-03-26 DIAGNOSIS — R82998 Other abnormal findings in urine: Secondary | ICD-10-CM

## 2014-03-26 DIAGNOSIS — C3491 Malignant neoplasm of unspecified part of right bronchus or lung: Secondary | ICD-10-CM

## 2014-03-26 DIAGNOSIS — C3482 Malignant neoplasm of overlapping sites of left bronchus and lung: Secondary | ICD-10-CM

## 2014-03-26 DIAGNOSIS — C3492 Malignant neoplasm of unspecified part of left bronchus or lung: Secondary | ICD-10-CM

## 2014-03-26 LAB — URINALYSIS, MICROSCOPIC - CHCC
Bilirubin (Urine): NEGATIVE
Blood: NEGATIVE
GLUCOSE UR CHCC: 1000 mg/dL
KETONES: NEGATIVE mg/dL
Nitrite: POSITIVE
Protein: NEGATIVE mg/dL
Specific Gravity, Urine: 1.015 (ref 1.003–1.035)
Urobilinogen, UR: 0.2 mg/dL (ref 0.2–1)
pH: 5 (ref 4.6–8.0)

## 2014-03-26 LAB — CBC WITH DIFFERENTIAL/PLATELET
BASO%: 0.7 % (ref 0.0–2.0)
BASOS ABS: 0 10*3/uL (ref 0.0–0.1)
EOS ABS: 0 10*3/uL (ref 0.0–0.5)
EOS%: 0.6 % (ref 0.0–7.0)
HCT: 28.8 % — ABNORMAL LOW (ref 34.8–46.6)
HGB: 9.3 g/dL — ABNORMAL LOW (ref 11.6–15.9)
LYMPH%: 9.1 % — ABNORMAL LOW (ref 14.0–49.7)
MCH: 30.4 pg (ref 25.1–34.0)
MCHC: 32.4 g/dL (ref 31.5–36.0)
MCV: 94 fL (ref 79.5–101.0)
MONO#: 0.7 10*3/uL (ref 0.1–0.9)
MONO%: 12.3 % (ref 0.0–14.0)
NEUT%: 77.3 % — ABNORMAL HIGH (ref 38.4–76.8)
NEUTROS ABS: 4.7 10*3/uL (ref 1.5–6.5)
PLATELETS: 224 10*3/uL (ref 145–400)
RBC: 3.06 10*6/uL — ABNORMAL LOW (ref 3.70–5.45)
RDW: 19 % — ABNORMAL HIGH (ref 11.2–14.5)
WBC: 6 10*3/uL (ref 3.9–10.3)
lymph#: 0.5 10*3/uL — ABNORMAL LOW (ref 0.9–3.3)

## 2014-03-26 LAB — COMPREHENSIVE METABOLIC PANEL (CC13)
ALK PHOS: 62 U/L (ref 40–150)
ALT: 42 U/L (ref 0–55)
AST: 24 U/L (ref 5–34)
Albumin: 4.1 g/dL (ref 3.5–5.0)
Anion Gap: 9 mEq/L (ref 3–11)
BUN: 14.2 mg/dL (ref 7.0–26.0)
CO2: 27 mEq/L (ref 22–29)
CREATININE: 0.8 mg/dL (ref 0.6–1.1)
Calcium: 10 mg/dL (ref 8.4–10.4)
Chloride: 105 mEq/L (ref 98–109)
GLUCOSE: 157 mg/dL — AB (ref 70–140)
Potassium: 4.3 mEq/L (ref 3.5–5.1)
Sodium: 141 mEq/L (ref 136–145)
Total Bilirubin: 0.45 mg/dL (ref 0.20–1.20)
Total Protein: 7.1 g/dL (ref 6.4–8.3)

## 2014-03-26 MED ORDER — ONDANSETRON 16 MG/50ML IVPB (CHCC)
INTRAVENOUS | Status: AC
  Filled 2014-03-26: qty 16

## 2014-03-26 MED ORDER — DEXAMETHASONE SODIUM PHOSPHATE 20 MG/5ML IJ SOLN
20.0000 mg | Freq: Once | INTRAMUSCULAR | Status: AC
  Administered 2014-03-26: 20 mg via INTRAVENOUS

## 2014-03-26 MED ORDER — SODIUM CHLORIDE 0.9 % IV SOLN
650.0000 mg | Freq: Once | INTRAVENOUS | Status: AC
  Administered 2014-03-26: 650 mg via INTRAVENOUS
  Filled 2014-03-26: qty 65

## 2014-03-26 MED ORDER — SODIUM CHLORIDE 0.9 % IV SOLN
Freq: Once | INTRAVENOUS | Status: AC
  Administered 2014-03-26: 13:00:00 via INTRAVENOUS

## 2014-03-26 MED ORDER — DEXAMETHASONE SODIUM PHOSPHATE 20 MG/5ML IJ SOLN
INTRAMUSCULAR | Status: AC
  Filled 2014-03-26: qty 5

## 2014-03-26 MED ORDER — SODIUM CHLORIDE 0.9 % IV SOLN
120.0000 mg/m2 | Freq: Once | INTRAVENOUS | Status: AC
  Administered 2014-03-26: 240 mg via INTRAVENOUS
  Filled 2014-03-26: qty 12

## 2014-03-26 MED ORDER — ONDANSETRON 16 MG/50ML IVPB (CHCC)
16.0000 mg | Freq: Once | INTRAVENOUS | Status: AC
  Administered 2014-03-26: 16 mg via INTRAVENOUS

## 2014-03-26 MED ORDER — SODIUM CHLORIDE 0.9 % IJ SOLN
10.0000 mL | INTRAMUSCULAR | Status: DC | PRN
  Administered 2014-03-26: 10 mL
  Filled 2014-03-26: qty 10

## 2014-03-26 MED ORDER — HEPARIN SOD (PORK) LOCK FLUSH 100 UNIT/ML IV SOLN
500.0000 [IU] | Freq: Once | INTRAVENOUS | Status: AC | PRN
  Administered 2014-03-26: 500 [IU]
  Filled 2014-03-26: qty 5

## 2014-03-26 NOTE — Patient Instructions (Signed)
[facility] Discharge Instructions for Patients Receiving Chemotherapy  Today you received the following chemotherapy agents: Alimta and Carbplatin  To help prevent nausea and vomiting after your treatment, we encourage you to take your nausea medication Compazine 10 mg every 6 hours or zofran 8mg  every 8 hours.   If you develop nausea and vomiting that is not controlled by your nausea medication, call the clinic.   BELOW ARE SYMPTOMS THAT SHOULD BE REPORTED IMMEDIATELY:  *FEVER GREATER THAN 100.5 F  *CHILLS WITH OR WITHOUT FEVER  NAUSEA AND VOMITING THAT IS NOT CONTROLLED WITH YOUR NAUSEA MEDICATION  *UNUSUAL SHORTNESS OF BREATH  *UNUSUAL BRUISING OR BLEEDING  TENDERNESS IN MOUTH AND THROAT WITH OR WITHOUT PRESENCE OF ULCERS  *URINARY PROBLEMS  *BOWEL PROBLEMS  UNUSUAL RASH Items with * indicate a potential emergency and should be followed up as soon as possible.  Feel free to call the clinic you have any questions or concerns. The clinic phone number is [phone].

## 2014-03-26 NOTE — Progress Notes (Signed)
[facility] [phone]   [phone]) [phone]  OFFICE PROGRESS NOTE  [name],[name], [place] [address]  DIAGNOSIS: Lung cancer  Primary site: Lung (Left)  Staging method: AJCC 7th Edition  Clinical free text: Extensive stage small cell lung cancer  Clinical: Stage IV (T3, N2, M1b) signed by Curt Bears, MD on 12/17/2013 7:45 PM  Summary: Stage IV (T3, N2, M1b)   PRIOR THERAPY:Whole brain irradiation under the care of Dr. Sondra Come   CURRENT THERAPY:  Systemic chemotherapy with carboplatin for an AUC of 5 given on day 1, etoposide at 120 mg per meter square given on days 1, 2 and 3 with Neulasta support given on day 4. Status post 4 cycles.  DISEASE STAGE: Lung cancer  Primary site: Lung (Left)  Staging method: AJCC 7th Edition  Clinical free text: Extensive stage small cell lung cancer  Clinical: Stage IV (T3, N2, M1b) signed by Curt Bears, MD on 12/17/2013 7:45 PM  Summary: Stage IV (T3, N2, M1b)  CHEMOTHERAPY INTENT: Palliative  CURRENT # OF CHEMOTHERAPY CYCLES: 5 CURRENT ANTIEMETICS: none  CURRENT SMOKING STATUS: Former smoker, quit July 2014  ORAL CHEMOTHERAPY AND CONSENT: n/a  CURRENT BISPHOSPHONATES USE: none  PAIN MANAGEMENT: oxycodone  NARCOTICS INDUCED CONSTIPATION: none  LIVING WILL AND CODE STATUS:    INTERVAL HISTORY: Carol Bond 58 y.o. female returns to the clinic today for followup visit accompanied by her husband and daughter. The patient is feeling fine today with no specific complaints except for increasing fatigue with the chemotherapy in addition to intermittent nausea. She is currently on Compazine and Zofran with mild improvement. She was started recently on Ativan by her primary care physician. She tolerated the last cycle of her systemic chemotherapy with carboplatin and etoposide fairly well. She denied having any significant weight loss or night sweats. She has no fever or  chills. She denied having any significant chest pain but continues to have shortness breath with exertion with no cough or hemoptysis. She had repeat CT scan of the chest, abdomen and pelvis performed recently and she is here for evaluation and discussion of her scan results.  MEDICAL HISTORY: Past Medical History  Diagnosis Date  . Pulmonary embolism   . Hypothyroid   . Diabetes   . Cancer     Lung  . Radiation 12/18/13-12/31/13    whole brain 30 gray, left central chest 30 gray    ALLERGIES:  is allergic to actos and vicodin.  MEDICATIONS:  Current Outpatient Prescriptions  Medication Sig Dispense Refill  . albuterol (PROVENTIL HFA;VENTOLIN HFA) 108 (90 BASE) MCG/ACT inhaler Inhale 2 puffs into the lungs every 6 (six) hours as needed for wheezing or shortness of breath.  1 Inhaler  2  . aspirin 81 MG tablet Take 162 mg by mouth once.      Marland Kitchen atorvastatin (LIPITOR) 40 MG tablet Take 40 mg by mouth daily.      . beclomethasone (QVAR) 80 MCG/ACT inhaler Inhale 2 puffs into the lungs at bedtime.      . betamethasone dipropionate (DIPROLENE) 0.05 % cream Apply 1 application topically daily as needed.      . Canagliflozin (INVOKANA) 300 MG TABS Take 1 tablet by mouth daily before breakfast.      . ciprofloxacin (CIPRO) 500 MG tablet Take 1 tablet (500 mg total) by mouth 2 (two) times daily. 500 mg BID x 3 days  6 tablet  0  .  emollient (BIAFINE) cream Apply topically 2 (two) times daily.      Marland Kitchen escitalopram (LEXAPRO) 10 MG tablet Take 10 mg by mouth daily.      Marland Kitchen esomeprazole (NEXIUM) 40 MG capsule Take 40 mg by mouth daily at 12 noon.      . fenofibrate 160 MG tablet Take 160 mg by mouth daily.      . fluconazole (DIFLUCAN) 100 MG tablet Take 1 tablet (100 mg total) by mouth as directed. 100mg  daily x 10 days  10 tablet  0  . glimepiride (AMARYL) 4 MG tablet Take 4 mg by mouth daily with breakfast.      . insulin glargine (LANTUS) 100 unit/mL SOPN Inject 0.05 mLs (5 Units total) into the  skin at bedtime. Solostar Pen  15 mL  11  . levothyroxine (SYNTHROID, LEVOTHROID) 150 MCG tablet Take 150 mcg by mouth daily before breakfast.      . lidocaine-prilocaine (EMLA) cream Apply 1 application topically as needed. Apply to port a cath site 90 minutes prior to chemotherapy appointment.  30 g  0  . loratadine (CLARITIN) 10 MG tablet Take 10 mg by mouth daily.      Marland Kitchen LOSARTAN POTASSIUM PO Take 50 mg by mouth daily.      . ondansetron (ZOFRAN) 8 MG tablet Take 1 tablet (8 mg total) by mouth every 8 (eight) hours as needed for nausea or vomiting.  30 tablet  1  . prochlorperazine (COMPAZINE) 10 MG tablet Take 1 tablet (10 mg total) by mouth every 6 (six) hours as needed for nausea or vomiting.  30 tablet  2  . sitaGLIPtin-metformin (JANUMET) 50-1000 MG per tablet Take 1 tablet by mouth 2 (two) times daily with a meal.      . triamcinolone (NASACORT AQ) 55 MCG/ACT AERO nasal inhaler Place 1 spray into the nose daily.       No current facility-administered medications for this visit.    SURGICAL HISTORY:  Past Surgical History  Procedure Laterality Date  . Abdominal hysterectomy    . Spine surgery    . Cervical fusion    . Video bronchoscopy with endobronchial ultrasound N/A 12/13/2013    Procedure: VIDEO BRONCHOSCOPY WITH ENDOBRONCHIAL ULTRASOUND;  Surgeon: Grace Isaac, MD;  Location: [facility];  Service: Thoracic;  Laterality: N/A;  . Portacath placement Left 12/13/2013    Procedure: INSERTION PORT-A-CATH;  Surgeon: Grace Isaac, MD;  Location: [facility];  Service: Thoracic;  Laterality: Left;    REVIEW OF SYSTEMS:  Constitutional: positive for fatigue Eyes: negative Ears, nose, mouth, throat, and face: negative Respiratory: positive for dyspnea on exertion Cardiovascular: negative Gastrointestinal: negative Genitourinary:negative Integument/breast: negative Hematologic/lymphatic: negative Musculoskeletal:negative Neurological: negative Behavioral/Psych:  negative Endocrine: negative Allergic/Immunologic: negative   PHYSICAL EXAMINATION: General appearance: alert, cooperative, fatigued and no distress Head: Normocephalic, without obvious abnormality, atraumatic Neck: no adenopathy, no JVD, supple, symmetrical, trachea midline and thyroid not enlarged, symmetric, no tenderness/mass/nodules Lymph nodes: Cervical, supraclavicular, and axillary nodes normal. Resp: clear to auscultation bilaterally Back: symmetric, no curvature. ROM normal. No CVA tenderness. Cardio: regular rate and rhythm, S1, S2 normal, no murmur, click, rub or gallop GI: soft, non-tender; bowel sounds normal; no masses,  no organomegaly Extremities: extremities normal, atraumatic, no cyanosis or edema Neurologic: Alert and oriented X 3, normal strength and tone. Normal symmetric reflexes. Normal coordination and gait  ECOG PERFORMANCE STATUS: 1 - Symptomatic but completely ambulatory  Blood pressure 113/62, pulse 86, temperature 98 F (36.7 C), temperature source  Oral, resp. rate 18, height 5\' 6"  (1.676 m), weight 175 lb 11.2 oz (79.697 kg).  LABORATORY DATA: Lab Results  Component Value Date   WBC 6.0 03/26/2014   HGB 9.3* 03/26/2014   HCT 28.8* 03/26/2014   MCV 94.0 03/26/2014   PLT 224 03/26/2014      Chemistry      Component Value Date/Time   NA 141 03/26/2014 1041   NA 140 12/19/2013 0320   K 4.3 03/26/2014 1041   K 4.3 12/19/2013 0320   CL 100 12/19/2013 0320   CO2 27 03/26/2014 1041   CO2 28 12/19/2013 0320   BUN 14.2 03/26/2014 1041   BUN 21 12/19/2013 0320   CREATININE 0.8 03/26/2014 1041   CREATININE 0.78 12/19/2013 0320      Component Value Date/Time   CALCIUM 10.0 03/26/2014 1041   CALCIUM 9.7 12/19/2013 0320   ALKPHOS 62 03/26/2014 1041   ALKPHOS 54 12/19/2013 0320   AST 24 03/26/2014 1041   AST 24 12/19/2013 0320   ALT 42 03/26/2014 1041   ALT 48* 12/19/2013 0320   BILITOT 0.45 03/26/2014 1041   BILITOT <0.2* 12/19/2013 0320       RADIOGRAPHIC  STUDIES: Ct Chest W Contrast  03/19/2014   CLINICAL DATA:  Lung cancer diagnosed April 2015. Chemotherapy in progress. Radiation complete. Subsequent treatment strategy.  EXAM: CT CHEST, ABDOMEN, AND PELVIS WITH CONTRAST  TECHNIQUE: Multidetector CT imaging of the chest, abdomen and pelvis was performed following the standard protocol during bolus administration of intravenous contrast.  CONTRAST:  1 OMNIPAQUE IOHEXOL 300 MG/ML SOLN, 171mL OMNIPAQUE IOHEXOL 300 MG/ML SOLN  COMPARISON:  CT CT 02/10/2014, PET-CT scan 01/02/2014 is  FINDINGS:   CT CHEST FINDINGS  No axillary supraclavicular lymphadenopathy. No mediastinal or hilar lymphadenopathy. No central pulmonary embolism.  There is continued interval contraction of the left upper lobe central perihilar suprahilar mass. Mass is difficult to measure. The greatest thickness is 7 mm compared to 17 mm. There is mild increase in thickening along the mediastinum in the left upper lobe. There is new left upper lobe nodularity adjacent to a linear band measuring 14 mm (image 13, series). Mild peripheral nodularity in the lingula and left lower lobe is new from prior. Right lung is clear.    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 Gallbladder, pancreas, and spleen are normal.  There is interval reduction in volume of the left adrenal metastasis measuring 12 x 13 mm compared to 15 x 12 mm on prior. Right adrenal gland is normal. Stomach, small bowel, and colon are unremarkable. Abdominal aorta is normal caliber. No retroperitoneal periportal lymphadenopathy. No free fluid the pelvis. Post hysterectomy anatomy. No pelvic lymphadenopathy. No aggressive osseous lesion. There is a well-circumscribed sclerotic lesion at L5 which was not hypermetabolic on comparison PET-CT scan. Hemangioma in lower thoracic spine (image 41 series 10)    IMPRESSION: Chest Impression:  1.  Continued contraction of the left suprahilar mass. 2. New nodularity in the left upper lobe favored to represent radiation change.  Abdomen / Pelvis Impression:  1. 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 2. No evidence of new metastatic disease in the abdomen or pelvis. 3. Stable left hepatic lobe hemangioma   Electronically Signed   By: Suzy Bouchard M.D.   On: 03/19/2014 16:04   ASSESSMENT AND PLAN: This is a very pleasant 58 years old white female recently diagnosed with  extensive stage small cell lung cancer status post whole brain irradiation in addition to 4 cycles of systemic chemotherapy with carboplatin and etoposide. Her recent scan showed continuous improvement in her disease. I discussed the scan results and showed the images to the patient and her family. I recommended for her to proceed with systemic chemotherapy today as scheduled for 2 more cycles. She will start cycle #5 today and she would come back for followup visit in 3 weeks with the start of cycle #6. For the nausea, she would continue with Compazine, Zofran and Ativan. She was advised to call immediately if she has any concerning symptoms in the interval. The patient voices understanding of current disease status and treatment options and is in agreement with the current care plan.  All questions were answered. The patient knows to call the clinic with any problems, questions or concerns. We can certainly see the patient much sooner if necessary.  I spent 15 minutes counseling the patient face to face. The total time spent in the appointment was 25 minutes.  Disclaimer: This note was dictated with voice recognition software. Similar sounding words can inadvertently be transcribed and may not be corrected upon review.

## 2014-03-26 NOTE — Progress Notes (Signed)
Handicap application given to pt.

## 2014-03-26 NOTE — Progress Notes (Signed)
Patient c/o odor and discoloration to urine-cloudy and orangish in color.  Per Dr. Julien Nordmann, pt. sent to lab for u/a. Pt. Verbalizes understanding.

## 2014-03-26 NOTE — Telephone Encounter (Signed)
Pts oxycodone rx was last filled in march 2015

## 2014-03-27 ENCOUNTER — Telehealth: Payer: Self-pay | Admitting: Internal Medicine

## 2014-03-27 ENCOUNTER — Ambulatory Visit (HOSPITAL_BASED_OUTPATIENT_CLINIC_OR_DEPARTMENT_OTHER): Payer: 59

## 2014-03-27 VITALS — BP 96/58 | HR 82 | Temp 98.2°F | Resp 18

## 2014-03-27 DIAGNOSIS — C3491 Malignant neoplasm of unspecified part of right bronchus or lung: Secondary | ICD-10-CM

## 2014-03-27 DIAGNOSIS — Z5111 Encounter for antineoplastic chemotherapy: Secondary | ICD-10-CM

## 2014-03-27 DIAGNOSIS — C3410 Malignant neoplasm of upper lobe, unspecified bronchus or lung: Secondary | ICD-10-CM

## 2014-03-27 MED ORDER — SODIUM CHLORIDE 0.9 % IJ SOLN
10.0000 mL | INTRAMUSCULAR | Status: DC | PRN
  Administered 2014-03-27: 10 mL
  Filled 2014-03-27: qty 10

## 2014-03-27 MED ORDER — ONDANSETRON 8 MG/NS 50 ML IVPB
INTRAVENOUS | Status: AC
  Filled 2014-03-27: qty 8

## 2014-03-27 MED ORDER — DEXAMETHASONE SODIUM PHOSPHATE 10 MG/ML IJ SOLN
INTRAMUSCULAR | Status: AC
  Filled 2014-03-27: qty 1

## 2014-03-27 MED ORDER — SODIUM CHLORIDE 0.9 % IV SOLN
Freq: Once | INTRAVENOUS | Status: AC
  Administered 2014-03-27: 13:00:00 via INTRAVENOUS

## 2014-03-27 MED ORDER — HEPARIN SOD (PORK) LOCK FLUSH 100 UNIT/ML IV SOLN
500.0000 [IU] | Freq: Once | INTRAVENOUS | Status: AC | PRN
Start: 2014-03-27 — End: 2014-03-27
  Administered 2014-03-27: 500 [IU]
  Filled 2014-03-27: qty 5

## 2014-03-27 MED ORDER — SODIUM CHLORIDE 0.9 % IV SOLN
120.0000 mg/m2 | Freq: Once | INTRAVENOUS | Status: AC
  Administered 2014-03-27: 240 mg via INTRAVENOUS
  Filled 2014-03-27: qty 12

## 2014-03-27 MED ORDER — DEXAMETHASONE SODIUM PHOSPHATE 10 MG/ML IJ SOLN
10.0000 mg | Freq: Once | INTRAMUSCULAR | Status: AC
  Administered 2014-03-27: 10 mg via INTRAVENOUS

## 2014-03-27 MED ORDER — ONDANSETRON 8 MG/50ML IVPB (CHCC)
8.0000 mg | Freq: Once | INTRAVENOUS | Status: AC
  Administered 2014-03-27: 8 mg via INTRAVENOUS

## 2014-03-27 NOTE — Telephone Encounter (Signed)
gv adn printed appt sched adn avs for pt fro July adn Aug

## 2014-03-27 NOTE — Patient Instructions (Signed)
[facility] Discharge Instructions for Patients Receiving Chemotherapy  Today you received the following chemotherapy agents Etoposide.   To help prevent nausea and vomiting after your treatment, we encourage you to take your nausea medication as directed.    If you develop nausea and vomiting that is not controlled by your nausea medication, call the clinic.   BELOW ARE SYMPTOMS THAT SHOULD BE REPORTED IMMEDIATELY:  *FEVER GREATER THAN 100.5 F  *CHILLS WITH OR WITHOUT FEVER  NAUSEA AND VOMITING THAT IS NOT CONTROLLED WITH YOUR NAUSEA MEDICATION  *UNUSUAL SHORTNESS OF BREATH  *UNUSUAL BRUISING OR BLEEDING  TENDERNESS IN MOUTH AND THROAT WITH OR WITHOUT PRESENCE OF ULCERS  *URINARY PROBLEMS  *BOWEL PROBLEMS  UNUSUAL RASH Items with * indicate a potential emergency and should be followed up as soon as possible.  Feel free to call the clinic you have any questions or concerns. The clinic phone number is [phone].

## 2014-03-28 ENCOUNTER — Ambulatory Visit (HOSPITAL_BASED_OUTPATIENT_CLINIC_OR_DEPARTMENT_OTHER): Payer: 59

## 2014-03-28 DIAGNOSIS — C3410 Malignant neoplasm of upper lobe, unspecified bronchus or lung: Secondary | ICD-10-CM

## 2014-03-28 DIAGNOSIS — C7931 Secondary malignant neoplasm of brain: Secondary | ICD-10-CM

## 2014-03-28 DIAGNOSIS — C3491 Malignant neoplasm of unspecified part of right bronchus or lung: Secondary | ICD-10-CM

## 2014-03-28 DIAGNOSIS — Z5111 Encounter for antineoplastic chemotherapy: Secondary | ICD-10-CM

## 2014-03-28 DIAGNOSIS — C7949 Secondary malignant neoplasm of other parts of nervous system: Secondary | ICD-10-CM

## 2014-03-28 MED ORDER — HEPARIN SOD (PORK) LOCK FLUSH 100 UNIT/ML IV SOLN
500.0000 [IU] | Freq: Once | INTRAVENOUS | Status: AC | PRN
  Administered 2014-03-28: 500 [IU]
  Filled 2014-03-28: qty 5

## 2014-03-28 MED ORDER — ONDANSETRON 8 MG/50ML IVPB (CHCC)
8.0000 mg | Freq: Once | INTRAVENOUS | Status: AC
  Administered 2014-03-28: 8 mg via INTRAVENOUS

## 2014-03-28 MED ORDER — SODIUM CHLORIDE 0.9 % IJ SOLN
10.0000 mL | INTRAMUSCULAR | Status: DC | PRN
  Administered 2014-03-28: 10 mL
  Filled 2014-03-28: qty 10

## 2014-03-28 MED ORDER — DEXAMETHASONE SODIUM PHOSPHATE 10 MG/ML IJ SOLN
10.0000 mg | Freq: Once | INTRAMUSCULAR | Status: AC
  Administered 2014-03-28: 10 mg via INTRAVENOUS

## 2014-03-28 MED ORDER — SODIUM CHLORIDE 0.9 % IV SOLN
120.0000 mg/m2 | Freq: Once | INTRAVENOUS | Status: AC
  Administered 2014-03-28: 240 mg via INTRAVENOUS
  Filled 2014-03-28: qty 12

## 2014-03-28 MED ORDER — ONDANSETRON 8 MG/NS 50 ML IVPB
INTRAVENOUS | Status: AC
  Filled 2014-03-28: qty 8

## 2014-03-28 MED ORDER — DEXAMETHASONE SODIUM PHOSPHATE 10 MG/ML IJ SOLN
INTRAMUSCULAR | Status: AC
  Filled 2014-03-28: qty 1

## 2014-03-28 MED ORDER — SODIUM CHLORIDE 0.9 % IV SOLN
Freq: Once | INTRAVENOUS | Status: AC
  Administered 2014-03-28: 14:00:00 via INTRAVENOUS

## 2014-03-28 NOTE — Patient Instructions (Signed)
Etoposide, VP-16 capsules What is this medicine? ETOPOSIDE, VP-16 (e toe POE side) is a chemotherapy drug. It is used to treat small cell lung cancer and other cancers. This medicine may be used for other purposes; ask your health care provider or pharmacist if you have questions. COMMON BRAND NAME(S): VePesid What should I tell my health care provider before I take this medicine? They need to know if you have any of these conditions: -infection -kidney disease -low blood counts, like low white cell, platelet, or red cell counts -an unusual or allergic reaction to etoposide, other chemotherapeutic agents, other medicines, foods, dyes, or preservatives -pregnant or trying to get pregnant -breast-feeding How should I use this medicine? Take this medicine by mouth with a glass of water. Follow the directions on the prescription label. Do not open, crush, or chew the capsules. It is advisable to wear gloves when handling this medicine. Take your medicine at regular intervals. Do not take it more often than directed. Do not stop taking except on your doctor's advice. Talk to your pediatrician regarding the use of this medicine in children. Special care may be needed. Overdosage: If you think you have taken too much of this medicine contact a poison control center or emergency room at once. NOTE: This medicine is only for you. Do not share this medicine with others. What if I miss a dose? If you miss a dose, take it as soon as you can. If it is almost time for your next dose, take only that dose. Do not take double or extra doses. What may interact with this medicine? -cyclosporine -medicines to increase blood counts like filgrastim, pegfilgrastim, sargramostim -vaccines This list may not describe all possible interactions. Give your health care provider a list of all the medicines, herbs, non-prescription drugs, or dietary supplements you use. Also tell them if you smoke, drink alcohol, or use  illegal drugs. Some items may interact with your medicine. What should I watch for while using this medicine? Visit your doctor for checks on your progress. This drug may make you feel generally unwell. This is not uncommon, as chemotherapy can affect healthy cells as well as cancer cells. Report any side effects. Continue your course of treatment even though you feel ill unless your doctor tells you to stop. In some cases, you may be given additional medicines to help with side effects. Follow all directions for their use. Call your doctor or health care professional for advice if you get a fever, chills or sore throat, or other symptoms of a cold or flu. Do not treat yourself. This drug decreases your body's ability to fight infections. Try to avoid being around people who are sick. This medicine may increase your risk to bruise or bleed. Call your doctor or health care professional if you notice any unusual bleeding. Be careful brushing and flossing your teeth or using a toothpick because you may get an infection or bleed more easily. If you have any dental work done, tell your dentist you are receiving this medicine. Avoid taking products that contain aspirin, acetaminophen, ibuprofen, naproxen, or ketoprofen unless instructed by your doctor. These medicines may hide a fever. Do not become pregnant while taking this medicine. Women should inform their doctor if they wish to become pregnant or think they might be pregnant. There is a potential for serious side effects to an unborn child. Talk to your health care professional or pharmacist for more information. Do not breast-feed an infant while taking this medicine.  What side effects may I notice from receiving this medicine? Side effects that you should report to your doctor or health care professional as soon as possible: -allergic reactions like skin rash, itching or hives, swelling of the face, lips, or tongue -low blood counts - this medicine may  decrease the number of white blood cells, red blood cells and platelets. You may be at increased risk for infections and bleeding. -signs of infection - fever or chills, cough, sore throat, pain or difficulty passing urine -signs of decreased platelets or bleeding - bruising, pinpoint red spots on the skin, black, tarry stools, blood in the urine -signs of decreased red blood cells - unusually weak or tired, fainting spells, lightheadedness -breathing problems -changes in vision -mouth or throat sores or ulcers -pain, tingling, numbness in the hands or feet -redness, blistering, peeling or loosening of the skin, including inside the mouth -seizures -vomiting Side effects that usually do not require medical attention (report to your doctor or health care professional if they continue or are bothersome): -change in taste -diarrhea -hair loss -nausea -stomach pain This list may not describe all possible side effects. Call your doctor for medical advice about side effects. You may report side effects to FDA at 1-800-FDA-1088. Where should I keep my medicine? Keep out of the reach of children. Store in a refrigerator between 2 and 8 degrees C (36 and 46 degrees F). Do not freeze. Throw away any unused medicine after the expiration date. NOTE: This sheet is a summary. It may not cover all possible information. If you have questions about this medicine, talk to your doctor, pharmacist, or health care provider.  2015, Elsevier/Gold Standard. (2007-12-17 17:22:30)

## 2014-03-29 ENCOUNTER — Ambulatory Visit (HOSPITAL_BASED_OUTPATIENT_CLINIC_OR_DEPARTMENT_OTHER): Payer: 59

## 2014-03-29 VITALS — BP 115/69 | HR 73 | Temp 97.8°F | Resp 17

## 2014-03-29 DIAGNOSIS — C3491 Malignant neoplasm of unspecified part of right bronchus or lung: Secondary | ICD-10-CM

## 2014-03-29 DIAGNOSIS — C7949 Secondary malignant neoplasm of other parts of nervous system: Secondary | ICD-10-CM

## 2014-03-29 DIAGNOSIS — Z5189 Encounter for other specified aftercare: Secondary | ICD-10-CM

## 2014-03-29 DIAGNOSIS — C3410 Malignant neoplasm of upper lobe, unspecified bronchus or lung: Secondary | ICD-10-CM

## 2014-03-29 DIAGNOSIS — C7931 Secondary malignant neoplasm of brain: Secondary | ICD-10-CM

## 2014-03-29 LAB — URINE CULTURE

## 2014-03-29 MED ORDER — PEGFILGRASTIM INJECTION 6 MG/0.6ML
6.0000 mg | Freq: Once | SUBCUTANEOUS | Status: AC
  Administered 2014-03-29: 6 mg via SUBCUTANEOUS

## 2014-03-29 NOTE — Patient Instructions (Signed)
Pegfilgrastim injection What is this medicine? PEGFILGRASTIM (peg fil GRA stim) is a long-acting granulocyte colony-stimulating factor that stimulates the growth of neutrophils, a type of white blood cell important in the body's fight against infection. It is used to reduce the incidence of fever and infection in patients with certain types of cancer who are receiving chemotherapy that affects the bone marrow. This medicine may be used for other purposes; ask your health care provider or pharmacist if you have questions. COMMON BRAND NAME(S): Neulasta What should I tell my health care provider before I take this medicine? They need to know if you have any of these conditions: -latex allergy -ongoing radiation therapy -sickle cell disease -skin reactions to acrylic adhesives (On-Body Injector only) -an unusual or allergic reaction to pegfilgrastim, filgrastim, other medicines, foods, dyes, or preservatives -pregnant or trying to get pregnant -breast-feeding How should I use this medicine? This medicine is for injection under the skin. If you get this medicine at home, you will be taught how to prepare and give the pre-filled syringe or how to use the On-body Injector. Refer to the patient Instructions for Use for detailed instructions. Use exactly as directed. Take your medicine at regular intervals. Do not take your medicine more often than directed. It is important that you put your used needles and syringes in a special sharps container. Do not put them in a trash can. If you do not have a sharps container, call your pharmacist or healthcare provider to get one. Talk to your pediatrician regarding the use of this medicine in children. Special care may be needed. Overdosage: If you think you have taken too much of this medicine contact a poison control center or emergency room at once. NOTE: This medicine is only for you. Do not share this medicine with others. What if I miss a dose? It is  important not to miss your dose. Call your doctor or health care professional if you miss your dose. If you miss a dose due to an On-body Injector failure or leakage, a new dose should be administered as soon as possible using a single prefilled syringe for manual use. What may interact with this medicine? Interactions have not been studied. Give your health care provider a list of all the medicines, herbs, non-prescription drugs, or dietary supplements you use. Also tell them if you smoke, drink alcohol, or use illegal drugs. Some items may interact with your medicine. This list may not describe all possible interactions. Give your health care provider a list of all the medicines, herbs, non-prescription drugs, or dietary supplements you use. Also tell them if you smoke, drink alcohol, or use illegal drugs. Some items may interact with your medicine. What should I watch for while using this medicine? You may need blood work done while you are taking this medicine. If you are going to need a MRI, CT scan, or other procedure, tell your doctor that you are using this medicine (On-Body Injector only). What side effects may I notice from receiving this medicine? Side effects that you should report to your doctor or health care professional as soon as possible: -allergic reactions like skin rash, itching or hives, swelling of the face, lips, or tongue -dizziness -fever -pain, redness, or irritation at site where injected -pinpoint red spots on the skin -shortness of breath or breathing problems -stomach or side pain, or pain at the shoulder -swelling -tiredness -trouble passing urine Side effects that usually do not require medical attention (report to your doctor   or health care professional if they continue or are bothersome): -bone pain -muscle pain This list may not describe all possible side effects. Call your doctor for medical advice about side effects. You may report side effects to FDA at  1-800-FDA-1088. Where should I keep my medicine? Keep out of the reach of children. Store pre-filled syringes in a refrigerator between 2 and 8 degrees C (36 and 46 degrees F). Do not freeze. Keep in carton to protect from light. Throw away this medicine if it is left out of the refrigerator for more than 48 hours. Throw away any unused medicine after the expiration date. NOTE: This sheet is a summary. It may not cover all possible information. If you have questions about this medicine, talk to your doctor, pharmacist, or health care provider.  2015, Elsevier/Gold Standard. (2013-11-14 16:14:05)  

## 2014-04-02 ENCOUNTER — Other Ambulatory Visit (HOSPITAL_BASED_OUTPATIENT_CLINIC_OR_DEPARTMENT_OTHER): Payer: 59

## 2014-04-02 DIAGNOSIS — C7931 Secondary malignant neoplasm of brain: Secondary | ICD-10-CM

## 2014-04-02 DIAGNOSIS — C7949 Secondary malignant neoplasm of other parts of nervous system: Secondary | ICD-10-CM

## 2014-04-02 DIAGNOSIS — C3410 Malignant neoplasm of upper lobe, unspecified bronchus or lung: Secondary | ICD-10-CM

## 2014-04-02 DIAGNOSIS — C3490 Malignant neoplasm of unspecified part of unspecified bronchus or lung: Secondary | ICD-10-CM

## 2014-04-02 LAB — COMPREHENSIVE METABOLIC PANEL (CC13)
ALT: 41 U/L (ref 0–55)
AST: 23 U/L (ref 5–34)
Albumin: 4.1 g/dL (ref 3.5–5.0)
Alkaline Phosphatase: 64 U/L (ref 40–150)
Anion Gap: 9 mEq/L (ref 3–11)
BUN: 16.3 mg/dL (ref 7.0–26.0)
CALCIUM: 10.2 mg/dL (ref 8.4–10.4)
CO2: 26 meq/L (ref 22–29)
Chloride: 108 mEq/L (ref 98–109)
Creatinine: 0.7 mg/dL (ref 0.6–1.1)
Glucose: 148 mg/dl — ABNORMAL HIGH (ref 70–140)
Potassium: 4 mEq/L (ref 3.5–5.1)
SODIUM: 142 meq/L (ref 136–145)
TOTAL PROTEIN: 7 g/dL (ref 6.4–8.3)
Total Bilirubin: 0.5 mg/dL (ref 0.20–1.20)

## 2014-04-02 LAB — CBC WITH DIFFERENTIAL/PLATELET
BASO%: 0.9 % (ref 0.0–2.0)
Basophils Absolute: 0 10*3/uL (ref 0.0–0.1)
EOS%: 0.6 % (ref 0.0–7.0)
Eosinophils Absolute: 0 10*3/uL (ref 0.0–0.5)
HCT: 24.1 % — ABNORMAL LOW (ref 34.8–46.6)
HGB: 7.8 g/dL — ABNORMAL LOW (ref 11.6–15.9)
LYMPH%: 13.6 % — AB (ref 14.0–49.7)
MCH: 30.2 pg (ref 25.1–34.0)
MCHC: 32.4 g/dL (ref 31.5–36.0)
MCV: 93 fL (ref 79.5–101.0)
MONO#: 0.1 10*3/uL (ref 0.1–0.9)
MONO%: 4.4 % (ref 0.0–14.0)
NEUT#: 1.7 10*3/uL (ref 1.5–6.5)
NEUT%: 80.5 % — ABNORMAL HIGH (ref 38.4–76.8)
PLATELETS: 155 10*3/uL (ref 145–400)
RBC: 2.59 10*6/uL — ABNORMAL LOW (ref 3.70–5.45)
RDW: 18.7 % — ABNORMAL HIGH (ref 11.2–14.5)
WBC: 2.2 10*3/uL — ABNORMAL LOW (ref 3.9–10.3)
lymph#: 0.3 10*3/uL — ABNORMAL LOW (ref 0.9–3.3)

## 2014-04-03 ENCOUNTER — Encounter: Payer: Self-pay | Admitting: *Deleted

## 2014-04-03 NOTE — Progress Notes (Signed)
Pt called and left a voice mail message to call.  I called and spoke with pt.  She stated she was questioning CT Chest scan and if she progressed.  I asked her what Dr. Julien Nordmann stated at last office visit.  She stated he did not say she had progressed and she was stable.  She re-read CT Chest and didn't understand some words.  I explained.

## 2014-04-09 ENCOUNTER — Other Ambulatory Visit (HOSPITAL_BASED_OUTPATIENT_CLINIC_OR_DEPARTMENT_OTHER): Payer: 59

## 2014-04-09 DIAGNOSIS — C3490 Malignant neoplasm of unspecified part of unspecified bronchus or lung: Secondary | ICD-10-CM

## 2014-04-09 DIAGNOSIS — C3410 Malignant neoplasm of upper lobe, unspecified bronchus or lung: Secondary | ICD-10-CM

## 2014-04-09 LAB — CBC WITH DIFFERENTIAL/PLATELET
BASO%: 0.8 % (ref 0.0–2.0)
Basophils Absolute: 0.1 10*3/uL (ref 0.0–0.1)
EOS%: 0.3 % (ref 0.0–7.0)
Eosinophils Absolute: 0 10*3/uL (ref 0.0–0.5)
HCT: 22.9 % — ABNORMAL LOW (ref 34.8–46.6)
HGB: 7.4 g/dL — ABNORMAL LOW (ref 11.6–15.9)
LYMPH#: 0.7 10*3/uL — AB (ref 0.9–3.3)
LYMPH%: 9.1 % — ABNORMAL LOW (ref 14.0–49.7)
MCH: 30.4 pg (ref 25.1–34.0)
MCHC: 32.4 g/dL (ref 31.5–36.0)
MCV: 94 fL (ref 79.5–101.0)
MONO#: 0.6 10*3/uL (ref 0.1–0.9)
MONO%: 7.7 % (ref 0.0–14.0)
NEUT#: 5.9 10*3/uL (ref 1.5–6.5)
NEUT%: 82.1 % — ABNORMAL HIGH (ref 38.4–76.8)
Platelets: 76 10*3/uL — ABNORMAL LOW (ref 145–400)
RBC: 2.44 10*6/uL — ABNORMAL LOW (ref 3.70–5.45)
RDW: 18.6 % — ABNORMAL HIGH (ref 11.2–14.5)
WBC: 7.2 10*3/uL (ref 3.9–10.3)

## 2014-04-09 LAB — COMPREHENSIVE METABOLIC PANEL (CC13)
ALT: 53 U/L (ref 0–55)
ANION GAP: 12 meq/L — AB (ref 3–11)
AST: 32 U/L (ref 5–34)
Albumin: 4.1 g/dL (ref 3.5–5.0)
Alkaline Phosphatase: 60 U/L (ref 40–150)
BILIRUBIN TOTAL: 0.4 mg/dL (ref 0.20–1.20)
BUN: 15 mg/dL (ref 7.0–26.0)
CO2: 26 meq/L (ref 22–29)
Calcium: 9.8 mg/dL (ref 8.4–10.4)
Chloride: 106 mEq/L (ref 98–109)
Creatinine: 0.8 mg/dL (ref 0.6–1.1)
Glucose: 137 mg/dl (ref 70–140)
Potassium: 4 mEq/L (ref 3.5–5.1)
SODIUM: 143 meq/L (ref 136–145)
TOTAL PROTEIN: 7.1 g/dL (ref 6.4–8.3)

## 2014-04-16 ENCOUNTER — Ambulatory Visit: Payer: 59 | Admitting: Physician Assistant

## 2014-04-16 ENCOUNTER — Ambulatory Visit (HOSPITAL_COMMUNITY)
Admission: RE | Admit: 2014-04-16 | Discharge: 2014-04-16 | Disposition: A | Discharge status: Home / Self Care | Payer: 59 | Source: Ambulatory Visit | Attending: Internal Medicine | Admitting: Internal Medicine

## 2014-04-16 ENCOUNTER — Telehealth: Payer: Self-pay | Admitting: Internal Medicine

## 2014-04-16 ENCOUNTER — Ambulatory Visit: Payer: 59

## 2014-04-16 ENCOUNTER — Ambulatory Visit (HOSPITAL_BASED_OUTPATIENT_CLINIC_OR_DEPARTMENT_OTHER): Payer: 59 | Admitting: Physician Assistant

## 2014-04-16 ENCOUNTER — Ambulatory Visit (HOSPITAL_BASED_OUTPATIENT_CLINIC_OR_DEPARTMENT_OTHER): Payer: 59

## 2014-04-16 ENCOUNTER — Other Ambulatory Visit: Payer: 59

## 2014-04-16 ENCOUNTER — Encounter: Payer: Self-pay | Admitting: Physician Assistant

## 2014-04-16 ENCOUNTER — Other Ambulatory Visit (HOSPITAL_BASED_OUTPATIENT_CLINIC_OR_DEPARTMENT_OTHER): Payer: 59

## 2014-04-16 VITALS — BP 132/72 | HR 93 | Temp 98.6°F | Resp 18 | Ht 66.0 in | Wt 177.9 lb

## 2014-04-16 DIAGNOSIS — C7949 Secondary malignant neoplasm of other parts of nervous system: Secondary | ICD-10-CM

## 2014-04-16 DIAGNOSIS — C3491 Malignant neoplasm of unspecified part of right bronchus or lung: Secondary | ICD-10-CM

## 2014-04-16 DIAGNOSIS — C3410 Malignant neoplasm of upper lobe, unspecified bronchus or lung: Secondary | ICD-10-CM

## 2014-04-16 DIAGNOSIS — C7931 Secondary malignant neoplasm of brain: Secondary | ICD-10-CM | POA: Insufficient documentation

## 2014-04-16 DIAGNOSIS — T451X5A Adverse effect of antineoplastic and immunosuppressive drugs, initial encounter: Secondary | ICD-10-CM

## 2014-04-16 DIAGNOSIS — C3490 Malignant neoplasm of unspecified part of unspecified bronchus or lung: Secondary | ICD-10-CM

## 2014-04-16 DIAGNOSIS — D6481 Anemia due to antineoplastic chemotherapy: Secondary | ICD-10-CM | POA: Insufficient documentation

## 2014-04-16 DIAGNOSIS — R5383 Other fatigue: Secondary | ICD-10-CM

## 2014-04-16 DIAGNOSIS — R0989 Other specified symptoms and signs involving the circulatory and respiratory systems: Secondary | ICD-10-CM

## 2014-04-16 DIAGNOSIS — R5381 Other malaise: Secondary | ICD-10-CM

## 2014-04-16 DIAGNOSIS — R110 Nausea: Secondary | ICD-10-CM

## 2014-04-16 DIAGNOSIS — R0609 Other forms of dyspnea: Secondary | ICD-10-CM

## 2014-04-16 DIAGNOSIS — Z5111 Encounter for antineoplastic chemotherapy: Secondary | ICD-10-CM

## 2014-04-16 LAB — COMPREHENSIVE METABOLIC PANEL (CC13)
ALK PHOS: 51 U/L (ref 40–150)
ALT: 33 U/L (ref 0–55)
AST: 28 U/L (ref 5–34)
Albumin: 4.1 g/dL (ref 3.5–5.0)
Anion Gap: 8 mEq/L (ref 3–11)
BILIRUBIN TOTAL: 0.38 mg/dL (ref 0.20–1.20)
BUN: 11 mg/dL (ref 7.0–26.0)
CO2: 26 mEq/L (ref 22–29)
CREATININE: 0.7 mg/dL (ref 0.6–1.1)
Calcium: 10.2 mg/dL (ref 8.4–10.4)
Chloride: 106 mEq/L (ref 98–109)
Glucose: 143 mg/dl — ABNORMAL HIGH (ref 70–140)
POTASSIUM: 4.3 meq/L (ref 3.5–5.1)
Sodium: 140 mEq/L (ref 136–145)
Total Protein: 7 g/dL (ref 6.4–8.3)

## 2014-04-16 LAB — CBC WITH DIFFERENTIAL/PLATELET
BASO%: 0.6 % (ref 0.0–2.0)
Basophils Absolute: 0 10*3/uL (ref 0.0–0.1)
EOS%: 1.4 % (ref 0.0–7.0)
Eosinophils Absolute: 0.1 10*3/uL (ref 0.0–0.5)
HEMATOCRIT: 24.2 % — AB (ref 34.8–46.6)
HGB: 7.5 g/dL — ABNORMAL LOW (ref 11.6–15.9)
LYMPH%: 12.8 % — AB (ref 14.0–49.7)
MCH: 30.7 pg (ref 25.1–34.0)
MCHC: 31 g/dL — AB (ref 31.5–36.0)
MCV: 99.2 fL (ref 79.5–101.0)
MONO#: 0.6 10*3/uL (ref 0.1–0.9)
MONO%: 12.4 % (ref 0.0–14.0)
NEUT#: 3.7 10*3/uL (ref 1.5–6.5)
NEUT%: 72.8 % (ref 38.4–76.8)
Platelets: 233 10*3/uL (ref 145–400)
RBC: 2.44 10*6/uL — ABNORMAL LOW (ref 3.70–5.45)
RDW: 20.9 % — ABNORMAL HIGH (ref 11.2–14.5)
WBC: 5.1 10*3/uL (ref 3.9–10.3)
lymph#: 0.7 10*3/uL — ABNORMAL LOW (ref 0.9–3.3)
nRBC: 2 % — ABNORMAL HIGH (ref 0–0)

## 2014-04-16 LAB — HOLD TUBE, BLOOD BANK

## 2014-04-16 MED ORDER — DEXAMETHASONE SODIUM PHOSPHATE 20 MG/5ML IJ SOLN
INTRAMUSCULAR | Status: AC
  Filled 2014-04-16: qty 5

## 2014-04-16 MED ORDER — DEXAMETHASONE SODIUM PHOSPHATE 20 MG/5ML IJ SOLN
20.0000 mg | Freq: Once | INTRAMUSCULAR | Status: AC
  Administered 2014-04-16: 20 mg via INTRAVENOUS

## 2014-04-16 MED ORDER — HEPARIN SOD (PORK) LOCK FLUSH 100 UNIT/ML IV SOLN
500.0000 [IU] | Freq: Once | INTRAVENOUS | Status: AC | PRN
  Administered 2014-04-16: 500 [IU]
  Filled 2014-04-16: qty 5

## 2014-04-16 MED ORDER — SODIUM CHLORIDE 0.9 % IV SOLN
120.0000 mg/m2 | Freq: Once | INTRAVENOUS | Status: AC
  Administered 2014-04-16: 240 mg via INTRAVENOUS
  Filled 2014-04-16: qty 12

## 2014-04-16 MED ORDER — SODIUM CHLORIDE 0.9 % IJ SOLN
10.0000 mL | INTRAMUSCULAR | Status: DC | PRN
  Administered 2014-04-16: 10 mL
  Filled 2014-04-16: qty 10

## 2014-04-16 MED ORDER — SODIUM CHLORIDE 0.9 % IV SOLN
Freq: Once | INTRAVENOUS | Status: AC
  Administered 2014-04-16: 14:00:00 via INTRAVENOUS

## 2014-04-16 MED ORDER — ONDANSETRON HCL 8 MG PO TABS: 8.0000 mg | ORAL_TABLET | Freq: Three times a day (TID) | ORAL | Status: DC | PRN

## 2014-04-16 MED ORDER — SODIUM CHLORIDE 0.9 % IV SOLN
650.0000 mg | Freq: Once | INTRAVENOUS | Status: AC
  Administered 2014-04-16: 650 mg via INTRAVENOUS
  Filled 2014-04-16: qty 65

## 2014-04-16 MED ORDER — ONDANSETRON 16 MG/50ML IVPB (CHCC)
INTRAVENOUS | Status: AC
  Filled 2014-04-16: qty 16

## 2014-04-16 MED ORDER — ONDANSETRON 16 MG/50ML IVPB (CHCC)
16.0000 mg | Freq: Once | INTRAVENOUS | Status: AC
  Administered 2014-04-16: 16 mg via INTRAVENOUS

## 2014-04-16 MED ORDER — PROMETHAZINE HCL 12.5 MG PO TABS: ORAL_TABLET | ORAL | Status: DC

## 2014-04-16 NOTE — Patient Instructions (Signed)
[facility] Discharge Instructions for Patients Receiving Chemotherapy  Today you received the following chemotherapy agents carboplatin and etoposide.  To help prevent nausea and vomiting after your treatment, we encourage you to take your nausea medication zofran or phenergan.   If you develop nausea and vomiting that is not controlled by your nausea medication, call the clinic.   BELOW ARE SYMPTOMS THAT SHOULD BE REPORTED IMMEDIATELY:  *FEVER GREATER THAN 100.5 F  *CHILLS WITH OR WITHOUT FEVER  NAUSEA AND VOMITING THAT IS NOT CONTROLLED WITH YOUR NAUSEA MEDICATION  *UNUSUAL SHORTNESS OF BREATH  *UNUSUAL BRUISING OR BLEEDING  TENDERNESS IN MOUTH AND THROAT WITH OR WITHOUT PRESENCE OF ULCERS  *URINARY PROBLEMS  *BOWEL PROBLEMS  UNUSUAL RASH Items with * indicate a potential emergency and should be followed up as soon as possible.  Feel free to call the clinic you have any questions or concerns. The clinic phone number is [phone].

## 2014-04-16 NOTE — Telephone Encounter (Signed)
labs/ov per 08/19 POF, Pt to pick up schedule after treatment or next schedule date 08/20.... KJ

## 2014-04-16 NOTE — Progress Notes (Addendum)
[facility] [phone]   [phone]  SHARED VISIT PROGRESS NOTE  [name],[name], [place] [address]  DIAGNOSIS: Lung cancer  Primary site: Lung (Left)  Staging method: AJCC 7th Edition  Clinical free text: Extensive stage small cell lung cancer  Clinical: Stage IV (T3, N2, M1b) signed by Curt Bears, MD on 12/17/2013 7:45 PM  Summary: Stage IV (T3, N2, M1b)   PRIOR THERAPY:Whole brain irradiation under the care of Dr. Sondra Come   CURRENT THERAPY:  Systemic chemotherapy with carboplatin for an AUC of 5 given on day 1, etoposide at 120 mg per meter square given on days 1, 2 and 3 with Neulasta support given on day 4. Status post 5 cycles.  DISEASE STAGE: Lung cancer  Primary site: Lung (Left)  Staging method: AJCC 7th Edition  Clinical free text: Extensive stage small cell lung cancer  Clinical: Stage IV (T3, N2, M1b) signed by Curt Bears, MD on 12/17/2013 7:45 PM  Summary: Stage IV (T3, N2, M1b)  CHEMOTHERAPY INTENT: Palliative  CURRENT # OF CHEMOTHERAPY CYCLES: 6 CURRENT ANTIEMETICS: none  CURRENT SMOKING STATUS: Former smoker, quit July 2014  ORAL CHEMOTHERAPY AND CONSENT: n/a  CURRENT BISPHOSPHONATES USE: none  PAIN MANAGEMENT: oxycodone  NARCOTICS INDUCED CONSTIPATION: none  LIVING WILL AND CODE STATUS:    INTERVAL HISTORY: Carol Bond 58 y.o. female returns to the clinic today for followup visit accompanied by her husband.  The patient is feeling fine today with no specific complaints except for increasing fatigue with the chemotherapy in addition to intermittent nausea. She is currently on Compazine and Zofran with mild improvement. She was started  on Ativan by her primary care physician. She states that Grayland Ormond works better for her than Compazine. She requests a prescription for this medication. She's also had a frontal headache behind her eyes and up towards the top of her head. She  denied any visual changes or disturbances. She tolerated the last cycle of her systemic chemotherapy with carboplatin and etoposide fairly well. She denied having any significant weight loss or night sweats. She has no fever or chills. She denied having any significant chest pain but continues to have shortness breath with exertion with no cough or hemoptysis. She states that she is to see her dentist for teeth cleaning.  MEDICAL HISTORY: Past Medical History  Diagnosis Date  . Pulmonary embolism   . Hypothyroid   . Diabetes   . Cancer     Lung  . Radiation 12/18/13-12/31/13    whole brain 30 gray, left central chest 30 gray    ALLERGIES:  is allergic to actos and vicodin.  MEDICATIONS:  Current Outpatient Prescriptions  Medication Sig Dispense Refill  . aspirin 81 MG tablet Take 162 mg by mouth once.      . betamethasone dipropionate (DIPROLENE) 0.05 % cream Apply 1 application topically daily as needed.      . Canagliflozin (INVOKANA) 300 MG TABS Take 1 tablet by mouth daily before breakfast.      . ciclopirox (PENLAC) 8 % solution       . emollient (BIAFINE) cream Apply topically 2 (two) times daily.      Marland Kitchen escitalopram (LEXAPRO) 10 MG tablet Take 10 mg by mouth daily.      Marland Kitchen esomeprazole (NEXIUM) 40 MG capsule Take 40 mg by mouth daily at 12 noon.      . fenofibrate 160 MG tablet Take 160 mg by  mouth daily.      Marland Kitchen glimepiride (AMARYL) 4 MG tablet Take 4 mg by mouth daily with breakfast.      . levothyroxine (SYNTHROID, LEVOTHROID) 150 MCG tablet Take 150 mcg by mouth daily before breakfast.      . lidocaine-prilocaine (EMLA) cream Apply 1 application topically as needed. Apply to port a cath site 90 minutes prior to chemotherapy appointment.  30 g  0  . loratadine (CLARITIN) 10 MG tablet Take 10 mg by mouth daily.      Marland Kitchen LORazepam (ATIVAN) 1 MG tablet Take 1 mg by mouth.      Marland Kitchen LOSARTAN POTASSIUM PO Take 50 mg by mouth daily.      . ondansetron (ZOFRAN) 8 MG tablet Take 1 tablet (8  mg total) by mouth every 8 (eight) hours as needed for nausea or vomiting.  30 tablet  1  . Oxycodone HCl 10 MG TABS Take 10 mg by mouth.      . sitaGLIPtin-metformin (JANUMET) 50-1000 MG per tablet Take 1 tablet by mouth 2 (two) times daily with a meal.      . triamcinolone (NASACORT AQ) 55 MCG/ACT AERO nasal inhaler Place 1 spray into the nose daily.      Marland Kitchen albuterol (PROVENTIL HFA;VENTOLIN HFA) 108 (90 BASE) MCG/ACT inhaler Inhale 2 puffs into the lungs every 6 (six) hours as needed for wheezing or shortness of breath.  1 Inhaler  2  . atorvastatin (LIPITOR) 40 MG tablet Take 40 mg by mouth daily.      . beclomethasone (QVAR) 80 MCG/ACT inhaler Inhale 2 puffs into the lungs at bedtime.      . insulin glargine (LANTUS) 100 unit/mL SOPN Inject 0.05 mLs (5 Units total) into the skin at bedtime. Solostar Pen  15 mL  11  . prochlorperazine (COMPAZINE) 10 MG tablet Take 1 tablet (10 mg total) by mouth every 6 (six) hours as needed for nausea or vomiting.  30 tablet  2  . promethazine (PHENERGAN) 12.5 MG tablet Take 1 or 2 tablets by mouth every 6 hours as needed for nausea  30 tablet  0   No current facility-administered medications for this visit.   Facility-Administered Medications Ordered in Other Visits  Medication Dose Route Frequency Provider Last Rate Last Dose  . heparin lock flush 100 unit/mL  500 Units Intracatheter Once PRN Ivyrose Hashman E Clevland Cork, PA-C      . sodium chloride 0.9 % injection 10 mL  10 mL Intracatheter PRN Carlton Adam, PA-C        SURGICAL HISTORY:  Past Surgical History  Procedure Laterality Date  . Abdominal hysterectomy    . Spine surgery    . Cervical fusion    . Video bronchoscopy with endobronchial ultrasound N/A 12/13/2013    Procedure: VIDEO BRONCHOSCOPY WITH ENDOBRONCHIAL ULTRASOUND;  Surgeon: Grace Isaac, MD;  Location: [facility];  Service: Thoracic;  Laterality: N/A;  . Portacath placement Left 12/13/2013    Procedure: INSERTION PORT-A-CATH;  Surgeon:  Grace Isaac, MD;  Location: [facility];  Service: Thoracic;  Laterality: Left;    REVIEW OF SYSTEMS:  Constitutional: positive for fatigue Eyes: negative Ears, nose, mouth, throat, and face: negative Respiratory: positive for dyspnea on exertion Cardiovascular: negative Gastrointestinal: positive for nausea Genitourinary:negative Integument/breast: negative Hematologic/lymphatic: negative Musculoskeletal:negative Neurological: negative Behavioral/Psych: negative Endocrine: negative Allergic/Immunologic: negative   PHYSICAL EXAMINATION: General appearance: alert, cooperative, fatigued and no distress Head: Normocephalic, without obvious abnormality, atraumatic Neck: no adenopathy, no JVD, supple, symmetrical,  trachea midline and thyroid not enlarged, symmetric, no tenderness/mass/nodules Lymph nodes: Cervical, supraclavicular, and axillary nodes normal. Resp: clear to auscultation bilaterally Back: symmetric, no curvature. ROM normal. No CVA tenderness. Cardio: regular rate and rhythm, S1, S2 normal, no murmur, click, rub or gallop GI: soft, non-tender; bowel sounds normal; no masses,  no organomegaly Extremities: extremities normal, atraumatic, no cyanosis or edema Neurologic: Alert and oriented X 3, normal strength and tone. Normal symmetric reflexes. Normal coordination and gait  ECOG PERFORMANCE STATUS: 1 - Symptomatic but completely ambulatory  Blood pressure 132/72, pulse 93, temperature 98.6 F (37 C), temperature source Oral, resp. rate 18, height 5\' 6"  (1.676 m), weight 177 lb 14.4 oz (80.695 kg).  LABORATORY DATA: Lab Results  Component Value Date   WBC 5.1 04/16/2014   HGB 7.5* 04/16/2014   HCT 24.2* 04/16/2014   MCV 99.2 04/16/2014   PLT 233 04/16/2014      Chemistry      Component Value Date/Time   NA 140 04/16/2014 1147   NA 140 12/19/2013 0320   K 4.3 04/16/2014 1147   K 4.3 12/19/2013 0320   CL 100 12/19/2013 0320   CO2 26 04/16/2014 1147   CO2 28 12/19/2013  0320   BUN 11.0 04/16/2014 1147   BUN 21 12/19/2013 0320   CREATININE 0.7 04/16/2014 1147   CREATININE 0.78 12/19/2013 0320      Component Value Date/Time   CALCIUM 10.2 04/16/2014 1147   CALCIUM 9.7 12/19/2013 0320   ALKPHOS 51 04/16/2014 1147   ALKPHOS 54 12/19/2013 0320   AST 28 04/16/2014 1147   AST 24 12/19/2013 0320   ALT 33 04/16/2014 1147   ALT 48* 12/19/2013 0320   BILITOT 0.38 04/16/2014 1147   BILITOT <0.2* 12/19/2013 0320       RADIOGRAPHIC STUDIES: Ct Chest W Contrast  03/19/2014   CLINICAL DATA:  Lung cancer diagnosed April 2015. Chemotherapy in progress. Radiation complete. Subsequent treatment strategy.  EXAM: CT CHEST, ABDOMEN, AND PELVIS WITH CONTRAST  TECHNIQUE: Multidetector CT imaging of the chest, abdomen and pelvis was performed following the standard protocol during bolus administration of intravenous contrast.  CONTRAST:  1 OMNIPAQUE IOHEXOL 300 MG/ML SOLN, 149mL OMNIPAQUE IOHEXOL 300 MG/ML SOLN  COMPARISON:  CT CT 02/10/2014, PET-CT scan 01/02/2014 is  FINDINGS:   CT CHEST FINDINGS  No axillary supraclavicular lymphadenopathy. No mediastinal or hilar lymphadenopathy. No central pulmonary embolism.  There is continued interval contraction of the left upper lobe central perihilar suprahilar mass. Mass is difficult to measure. The greatest thickness is 7 mm compared to 17 mm. There is mild increase in thickening along the mediastinum in the left upper lobe. There is new left upper lobe nodularity adjacent to a linear band measuring 14 mm (image 13, series). Mild peripheral nodularity in the lingula and left lower lobe is new from prior. Right lung is clear.    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 Gallbladder, pancreas, and spleen are normal.  There is interval reduction in volume of the left adrenal metastasis measuring 12 x 13 mm compared to 15 x 12 mm on prior. Right adrenal  gland is normal. Stomach, small bowel, and colon are unremarkable. Abdominal aorta is normal caliber. No retroperitoneal periportal lymphadenopathy. No free fluid the pelvis. Post hysterectomy anatomy. No pelvic lymphadenopathy. No aggressive osseous lesion. There is a well-circumscribed sclerotic lesion at L5 which was not hypermetabolic on comparison PET-CT  scan. Hemangioma in lower thoracic spine (image 41 series 10)    IMPRESSION: Chest Impression:  1. Continued contraction of the left suprahilar mass. 2. New nodularity in the left upper lobe favored to represent radiation change.  Abdomen / Pelvis Impression:  1. 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 2. No evidence of new metastatic disease in the abdomen or pelvis. 3. Stable left hepatic lobe hemangioma   Electronically Signed   By: Suzy Bouchard M.D.   On: 03/19/2014 16:04   ASSESSMENT AND PLAN: This is a very pleasant 58 years old white female recently diagnosed with extensive stage small cell lung cancer status post whole brain irradiation in addition to 5 cycles of systemic chemotherapy with carboplatin and etoposide. Her last scan showed continuous improvement in her disease. The patient was discussed with and also seen by Dr. Julien Nordmann. For nausea prescription for Phenergan was sent her pharmacy of record via E. scribe she will take this instead of the Compazine. Her labs been reviewed and are within trivial range. She will proceed with cycle #6 today as scheduled. She'll continue with weekly labs and return in 3 weeks with a restaging CT scan of her chest, abdomen and pelvis with contrast to reevaluate her disease. Regarding her headache this may be related to seasonal allergies as well as some residual from her whole brain radiation. We have asked her to monitor her symptoms closely and seek evaluation should they persist or worsen. She voiced understanding. Regarding scheduling her dental cleaning. We've asked her to wait and  she completes her chemotherapy.  She was advised to call immediately if she has any concerning symptoms in the interval. The patient voices understanding of current disease status and treatment options and is in agreement with the current care plan.  All questions were answered. The patient knows to call the clinic with any problems, questions or concerns. We can certainly see the patient much sooner if necessary.  Carlton Adam PA-C  ADDENDUM: Hematology/Oncology Attending: I had a face to face encounter with the patient. I recommended her care plan. This is a very pleasant 58 years old white female with extensive stage small cell lung cancer. She is currently undergoing systemic chemotherapy with carboplatin and etoposide status post 5 cycles. She is tolerating her treatment fairly well with no significant adverse effects except for mild fatigue and occasional nausea. She preferred Phenergan for nausea and instead of Compazine. We will proceed with cycle #6 today as scheduled. The patient would come back for followup visit in 3 weeks after repeating CT scan of the chest, abdomen and pelvis for restaging of her disease. She was advised to call immediately if she has any concerning symptoms in the interval.  Disclaimer: This note was dictated with voice recognition software. Similar sounding words can inadvertently be transcribed and may not be corrected upon review. Eilleen Kempf., MD 04/19/2014

## 2014-04-17 ENCOUNTER — Ambulatory Visit (HOSPITAL_BASED_OUTPATIENT_CLINIC_OR_DEPARTMENT_OTHER): Payer: 59

## 2014-04-17 ENCOUNTER — Other Ambulatory Visit: Payer: Self-pay | Admitting: *Deleted

## 2014-04-17 VITALS — BP 113/69 | HR 73 | Temp 98.3°F | Resp 18

## 2014-04-17 DIAGNOSIS — T451X5A Adverse effect of antineoplastic and immunosuppressive drugs, initial encounter: Secondary | ICD-10-CM | POA: Diagnosis not present

## 2014-04-17 DIAGNOSIS — C3491 Malignant neoplasm of unspecified part of right bronchus or lung: Secondary | ICD-10-CM

## 2014-04-17 DIAGNOSIS — C3410 Malignant neoplasm of upper lobe, unspecified bronchus or lung: Secondary | ICD-10-CM

## 2014-04-17 DIAGNOSIS — D6481 Anemia due to antineoplastic chemotherapy: Secondary | ICD-10-CM | POA: Diagnosis not present

## 2014-04-17 DIAGNOSIS — Z5111 Encounter for antineoplastic chemotherapy: Secondary | ICD-10-CM

## 2014-04-17 MED ORDER — DIPHENHYDRAMINE HCL 25 MG PO CAPS
25.0000 mg | ORAL_CAPSULE | Freq: Once | ORAL | Status: AC
  Administered 2014-04-17: 25 mg via ORAL

## 2014-04-17 MED ORDER — ONDANSETRON 8 MG/NS 50 ML IVPB
INTRAVENOUS | Status: AC
  Filled 2014-04-17: qty 8

## 2014-04-17 MED ORDER — ONDANSETRON 8 MG/50ML IVPB (CHCC)
8.0000 mg | Freq: Once | INTRAVENOUS | Status: AC
  Administered 2014-04-17: 8 mg via INTRAVENOUS

## 2014-04-17 MED ORDER — SODIUM CHLORIDE 0.9 % IJ SOLN
10.0000 mL | INTRAMUSCULAR | Status: DC | PRN
  Administered 2014-04-17: 10 mL
  Filled 2014-04-17: qty 10

## 2014-04-17 MED ORDER — DEXAMETHASONE SODIUM PHOSPHATE 10 MG/ML IJ SOLN
INTRAMUSCULAR | Status: AC
  Filled 2014-04-17: qty 1

## 2014-04-17 MED ORDER — SODIUM CHLORIDE 0.9 % IV SOLN
120.0000 mg/m2 | Freq: Once | INTRAVENOUS | Status: AC
  Administered 2014-04-17: 240 mg via INTRAVENOUS
  Filled 2014-04-17: qty 12

## 2014-04-17 MED ORDER — ACETAMINOPHEN 325 MG PO TABS
650.0000 mg | ORAL_TABLET | Freq: Once | ORAL | Status: AC
  Administered 2014-04-17: 650 mg via ORAL

## 2014-04-17 MED ORDER — DEXAMETHASONE SODIUM PHOSPHATE 10 MG/ML IJ SOLN
10.0000 mg | Freq: Once | INTRAMUSCULAR | Status: AC
  Administered 2014-04-17: 10 mg via INTRAVENOUS

## 2014-04-17 MED ORDER — DIPHENHYDRAMINE HCL 25 MG PO CAPS
ORAL_CAPSULE | ORAL | Status: AC
  Filled 2014-04-17: qty 1

## 2014-04-17 MED ORDER — HEPARIN SOD (PORK) LOCK FLUSH 100 UNIT/ML IV SOLN
500.0000 [IU] | Freq: Once | INTRAVENOUS | Status: AC | PRN
  Administered 2014-04-17: 500 [IU]
  Filled 2014-04-17: qty 5

## 2014-04-17 MED ORDER — SODIUM CHLORIDE 0.9 % IV SOLN: 250.0000 mL | Freq: Once | INTRAVENOUS | Status: DC

## 2014-04-17 MED ORDER — ACETAMINOPHEN 325 MG PO TABS
ORAL_TABLET | ORAL | Status: AC
  Filled 2014-04-17: qty 2

## 2014-04-17 MED ORDER — SODIUM CHLORIDE 0.9 % IV SOLN
Freq: Once | INTRAVENOUS | Status: AC
  Administered 2014-04-17: 14:00:00 via INTRAVENOUS

## 2014-04-17 NOTE — Patient Instructions (Addendum)
[facility] Discharge Instructions for Patients Receiving Chemotherapy  Today you received the following chemotherapy agents: VP16  To help prevent nausea and vomiting after your treatment, we encourage you to take your nausea medication: Zofran 8 mg every 8 hours as needed and compazine 10 mg every 6 hours as needed.   If you develop nausea and vomiting that is not controlled by your nausea medication, call the clinic.   BELOW ARE SYMPTOMS THAT SHOULD BE REPORTED IMMEDIATELY:  *FEVER GREATER THAN 100.5 F  *CHILLS WITH OR WITHOUT FEVER  NAUSEA AND VOMITING THAT IS NOT CONTROLLED WITH YOUR NAUSEA MEDICATION  *UNUSUAL SHORTNESS OF BREATH  *UNUSUAL BRUISING OR BLEEDING  TENDERNESS IN MOUTH AND THROAT WITH OR WITHOUT PRESENCE OF ULCERS  *URINARY PROBLEMS  *BOWEL PROBLEMS  UNUSUAL RASH Items with * indicate a potential emergency and should be followed up as soon as possible.  Feel free to call the clinic you have any questions or concerns. The clinic phone number is [phone].  Blood Transfusion Information WHAT IS A BLOOD TRANSFUSION? A transfusion is the replacement of blood or some of its parts. Blood is made up of multiple cells which provide different functions.  Red blood cells carry oxygen and are used for blood loss replacement.  White blood cells fight against infection.  Platelets control bleeding.  Plasma helps clot blood.  Other blood products are available for specialized needs, such as hemophilia or other clotting disorders. BEFORE THE TRANSFUSION  Who gives blood for transfusions?   You may be able to donate blood to be used at a later date on yourself (autologous donation).  Relatives can be asked to donate blood. This is generally not any safer than if you have received blood from a stranger. The same precautions are taken to ensure safety when a relative's blood is donated.  Healthy volunteers who are fully evaluated to make sure  their blood is safe. This is blood bank blood. Transfusion therapy is the safest it has ever been in the practice of medicine. Before blood is taken from a donor, a complete history is taken to make sure that person has no history of diseases nor engages in risky social behavior (examples are intravenous drug use or sexual activity with multiple partners). The donor's travel history is screened to minimize risk of transmitting infections, such as malaria. The donated blood is tested for signs of infectious diseases, such as HIV and hepatitis. The blood is then tested to be sure it is compatible with you in order to minimize the chance of a transfusion reaction. If you or a relative donates blood, this is often done in anticipation of surgery and is not appropriate for emergency situations. It takes many days to process the donated blood. RISKS AND COMPLICATIONS Although transfusion therapy is very safe and saves many lives, the main dangers of transfusion include:   Getting an infectious disease.  Developing a transfusion reaction. This is an allergic reaction to something in the blood you were given. Every precaution is taken to prevent this. The decision to have a blood transfusion has been considered carefully by your caregiver before blood is given. Blood is not given unless the benefits outweigh the risks. AFTER THE TRANSFUSION  Right after receiving a blood transfusion, you will usually feel much better and more energetic. This is especially true if your red blood cells have gotten low (anemic). The transfusion raises the level of the red blood cells which carry oxygen, and this usually  causes an energy increase.  The nurse administering the transfusion will monitor you carefully for complications. HOME CARE INSTRUCTIONS  No special instructions are needed after a transfusion. You may find your energy is better. Speak with your caregiver about any limitations on activity for underlying diseases  you may have. SEEK MEDICAL CARE IF:   Your condition is not improving after your transfusion.  You develop redness or irritation at the intravenous (IV) site. SEEK IMMEDIATE MEDICAL CARE IF:  Any of the following symptoms occur over the next 12 hours:  Shaking chills.  You have a temperature by mouth above 102 F (38.9 C), not controlled by medicine.  Chest, back, or muscle pain.  People around you feel you are not acting correctly or are confused.  Shortness of breath or difficulty breathing.  Dizziness and fainting.  You get a rash or develop hives.  You have a decrease in urine output.  Your urine turns a dark color or changes to pink, red, or brown. Any of the following symptoms occur over the next 10 days:  You have a temperature by mouth above 102 F (38.9 C), not controlled by medicine.  Shortness of breath.  Weakness after normal activity.  The white part of the eye turns yellow (jaundice).  You have a decrease in the amount of urine or are urinating less often.  Your urine turns a dark color or changes to pink, red, or brown. Document Released: 08/12/2000 Document Revised: 11/07/2011 Document Reviewed: 03/31/2008 [facility] Patient Information 2015 [place], [place]. This information is not intended to replace advice given to you by your health care provider. Make sure you discuss any questions you have with your health care provider.

## 2014-04-18 ENCOUNTER — Ambulatory Visit (HOSPITAL_BASED_OUTPATIENT_CLINIC_OR_DEPARTMENT_OTHER): Payer: 59

## 2014-04-18 ENCOUNTER — Other Ambulatory Visit: Payer: Self-pay | Admitting: Medical Oncology

## 2014-04-18 VITALS — BP 121/60 | HR 79 | Temp 98.0°F | Resp 18

## 2014-04-18 DIAGNOSIS — D6481 Anemia due to antineoplastic chemotherapy: Secondary | ICD-10-CM | POA: Diagnosis not present

## 2014-04-18 DIAGNOSIS — Z5111 Encounter for antineoplastic chemotherapy: Secondary | ICD-10-CM

## 2014-04-18 DIAGNOSIS — C3491 Malignant neoplasm of unspecified part of right bronchus or lung: Secondary | ICD-10-CM

## 2014-04-18 DIAGNOSIS — C3410 Malignant neoplasm of upper lobe, unspecified bronchus or lung: Secondary | ICD-10-CM

## 2014-04-18 LAB — PREPARE RBC (CROSSMATCH)

## 2014-04-18 MED ORDER — DIPHENHYDRAMINE HCL 25 MG PO CAPS
ORAL_CAPSULE | ORAL | Status: AC
  Filled 2014-04-18: qty 1

## 2014-04-18 MED ORDER — ONDANSETRON 8 MG/50ML IVPB (CHCC)
8.0000 mg | Freq: Once | INTRAVENOUS | Status: AC
  Administered 2014-04-18: 8 mg via INTRAVENOUS

## 2014-04-18 MED ORDER — SODIUM CHLORIDE 0.9 % IJ SOLN
10.0000 mL | INTRAMUSCULAR | Status: DC | PRN
  Administered 2014-04-18: 10 mL
  Filled 2014-04-18: qty 10

## 2014-04-18 MED ORDER — SODIUM CHLORIDE 0.9 % IV SOLN
120.0000 mg/m2 | Freq: Once | INTRAVENOUS | Status: AC
  Administered 2014-04-18: 240 mg via INTRAVENOUS
  Filled 2014-04-18: qty 12

## 2014-04-18 MED ORDER — ACETAMINOPHEN 325 MG PO TABS
ORAL_TABLET | ORAL | Status: AC
  Filled 2014-04-18: qty 2

## 2014-04-18 MED ORDER — ACETAMINOPHEN 325 MG PO TABS
650.0000 mg | ORAL_TABLET | Freq: Once | ORAL | Status: AC
  Administered 2014-04-18: 650 mg via ORAL

## 2014-04-18 MED ORDER — ONDANSETRON 8 MG/NS 50 ML IVPB
INTRAVENOUS | Status: AC
  Filled 2014-04-18: qty 8

## 2014-04-18 MED ORDER — DIPHENHYDRAMINE HCL 25 MG PO CAPS
25.0000 mg | ORAL_CAPSULE | Freq: Once | ORAL | Status: AC
  Administered 2014-04-18: 25 mg via ORAL

## 2014-04-18 MED ORDER — DEXAMETHASONE SODIUM PHOSPHATE 10 MG/ML IJ SOLN
10.0000 mg | Freq: Once | INTRAMUSCULAR | Status: AC
  Administered 2014-04-18: 10 mg via INTRAVENOUS

## 2014-04-18 MED ORDER — HEPARIN SOD (PORK) LOCK FLUSH 100 UNIT/ML IV SOLN
500.0000 [IU] | Freq: Once | INTRAVENOUS | Status: AC | PRN
Start: 2014-04-18 — End: 2014-04-18
  Administered 2014-04-18: 500 [IU]
  Filled 2014-04-18: qty 5

## 2014-04-18 MED ORDER — DEXAMETHASONE SODIUM PHOSPHATE 10 MG/ML IJ SOLN
INTRAMUSCULAR | Status: AC
  Filled 2014-04-18: qty 1

## 2014-04-18 MED ORDER — SODIUM CHLORIDE 0.9 % IV SOLN
Freq: Once | INTRAVENOUS | Status: AC
  Administered 2014-04-18: 16:00:00 via INTRAVENOUS

## 2014-04-18 NOTE — Patient Instructions (Signed)
Take the Phenergan instead of Compazine for your nausea in addition to the Zofran and Ativan. Continue with weekly labs as scheduled Followup in 3 weeks with restaging CT scan of the chest, abdomen and pelvis to reevaluate her disease

## 2014-04-18 NOTE — Patient Instructions (Signed)
Anemia, Nonspecific Anemia is a condition in which the concentration of red blood cells or hemoglobin in the blood is below normal. Hemoglobin is a substance in red blood cells that carries oxygen to the tissues of the body. Anemia results in not enough oxygen reaching these tissues.  CAUSES  Common causes of anemia include:   Excessive bleeding. Bleeding may be internal or external. This includes excessive bleeding from periods (in women) or from the intestine.   Poor nutrition.   Chronic kidney, thyroid, and liver disease.  Bone marrow disorders that decrease red blood cell production.  Cancer and treatments for cancer.  HIV, AIDS, and their treatments.  Spleen problems that increase red blood cell destruction.  Blood disorders.  Excess destruction of red blood cells due to infection, medicines, and autoimmune disorders. SIGNS AND SYMPTOMS   Minor weakness.   Dizziness.   Headache.  Palpitations.   Shortness of breath, especially with exercise.   Paleness.  Cold sensitivity.  Indigestion.  Nausea.  Difficulty sleeping.  Difficulty concentrating. Symptoms may occur suddenly or they may develop slowly.  DIAGNOSIS  Additional blood tests are often needed. These help your health care provider determine the best treatment. Your health care provider will check your stool for blood and look for other causes of blood loss.  TREATMENT  Treatment varies depending on the cause of the anemia. Treatment can include:   Supplements of iron, vitamin M42, or folic acid.   Hormone medicines.   A blood transfusion. This may be needed if blood loss is severe.   Hospitalization. This may be needed if there is significant continual blood loss.   Dietary changes.  Spleen removal. HOME CARE INSTRUCTIONS Keep all follow-up appointments. It often takes many weeks to correct anemia, and having your health care provider check on your condition and your response to  treatment is very important. SEEK IMMEDIATE MEDICAL CARE IF:   You develop extreme weakness, shortness of breath, or chest pain.   You become dizzy or have trouble concentrating.  You develop heavy vaginal bleeding.   You develop a rash.   You have bloody or black, tarry stools.   You faint.   You vomit up blood.   You vomit repeatedly.   You have abdominal pain.  You have a fever or persistent symptoms for more than 2-3 days.   You have a fever and your symptoms suddenly get worse.   You are dehydrated.  MAKE SURE YOU:  Understand these instructions.  Will watch your condition.  Will get help right away if you are not doing well or get worse. Document Released: 09/22/2004 Document Revised: 04/17/2013 Document Reviewed: 02/08/2013 [facility] Patient Information 2015 [place], [place]. This information is not intended to replace advice given to you by your health care provider. Make sure you discuss any questions you have with your health care provider. Etoposide, VP-16 capsules What is this medicine? ETOPOSIDE, VP-16 (e toe POE side) is a chemotherapy drug. It is used to treat small cell lung cancer and other cancers. This medicine may be used for other purposes; ask your health care provider or pharmacist if you have questions. COMMON BRAND NAME(S): VePesid What should I tell my health care provider before I take this medicine? They need to know if you have any of these conditions: -infection -kidney disease -low blood counts, like low white cell, platelet, or red cell counts -an unusual or allergic reaction to etoposide, other chemotherapeutic agents, other medicines, foods, dyes, or preservatives -pregnant or  trying to get pregnant -breast-feeding How should I use this medicine? Take this medicine by mouth with a glass of water. Follow the directions on the prescription label. Do not open, crush, or chew the capsules. It is advisable to wear gloves when  handling this medicine. Take your medicine at regular intervals. Do not take it more often than directed. Do not stop taking except on your doctor's advice. Talk to your pediatrician regarding the use of this medicine in children. Special care may be needed. Overdosage: If you think you have taken too much of this medicine contact a poison control center or emergency room at once. NOTE: This medicine is only for you. Do not share this medicine with others. What if I miss a dose? If you miss a dose, take it as soon as you can. If it is almost time for your next dose, take only that dose. Do not take double or extra doses. What may interact with this medicine? -cyclosporine -medicines to increase blood counts like filgrastim, pegfilgrastim, sargramostim -vaccines This list may not describe all possible interactions. Give your health care provider a list of all the medicines, herbs, non-prescription drugs, or dietary supplements you use. Also tell them if you smoke, drink alcohol, or use illegal drugs. Some items may interact with your medicine. What should I watch for while using this medicine? Visit your doctor for checks on your progress. This drug may make you feel generally unwell. This is not uncommon, as chemotherapy can affect healthy cells as well as cancer cells. Report any side effects. Continue your course of treatment even though you feel ill unless your doctor tells you to stop. In some cases, you may be given additional medicines to help with side effects. Follow all directions for their use. Call your doctor or health care professional for advice if you get a fever, chills or sore throat, or other symptoms of a cold or flu. Do not treat yourself. This drug decreases your body's ability to fight infections. Try to avoid being around people who are sick. This medicine may increase your risk to bruise or bleed. Call your doctor or health care professional if you notice any unusual  bleeding. Be careful brushing and flossing your teeth or using a toothpick because you may get an infection or bleed more easily. If you have any dental work done, tell your dentist you are receiving this medicine. Avoid taking products that contain aspirin, acetaminophen, ibuprofen, naproxen, or ketoprofen unless instructed by your doctor. These medicines may hide a fever. Do not become pregnant while taking this medicine. Women should inform their doctor if they wish to become pregnant or think they might be pregnant. There is a potential for serious side effects to an unborn child. Talk to your health care professional or pharmacist for more information. Do not breast-feed an infant while taking this medicine. What side effects may I notice from receiving this medicine? Side effects that you should report to your doctor or health care professional as soon as possible: -allergic reactions like skin rash, itching or hives, swelling of the face, lips, or tongue -low blood counts - this medicine may decrease the number of white blood cells, red blood cells and platelets. You may be at increased risk for infections and bleeding. -signs of infection - fever or chills, cough, sore throat, pain or difficulty passing urine -signs of decreased platelets or bleeding - bruising, pinpoint red spots on the skin, black, tarry stools, blood in the urine -signs  of decreased red blood cells - unusually weak or tired, fainting spells, lightheadedness -breathing problems -changes in vision -mouth or throat sores or ulcers -pain, tingling, numbness in the hands or feet -redness, blistering, peeling or loosening of the skin, including inside the mouth -seizures -vomiting Side effects that usually do not require medical attention (report to your doctor or health care professional if they continue or are bothersome): -change in taste -diarrhea -hair loss -nausea -stomach pain This list may not describe all  possible side effects. Call your doctor for medical advice about side effects. You may report side effects to FDA at 1-800-FDA-1088. Where should I keep my medicine? Keep out of the reach of children. Store in a refrigerator between 2 and 8 degrees C (36 and 46 degrees F). Do not freeze. Throw away any unused medicine after the expiration date. NOTE: This sheet is a summary. It may not cover all possible information. If you have questions about this medicine, talk to your doctor, pharmacist, or health care provider.  2015, [employer]vier/[employer] Standard. (2007-12-17 17:22:30)

## 2014-04-19 ENCOUNTER — Ambulatory Visit (HOSPITAL_BASED_OUTPATIENT_CLINIC_OR_DEPARTMENT_OTHER): Payer: 59

## 2014-04-19 VITALS — BP 123/68 | HR 58 | Temp 98.1°F | Resp 18

## 2014-04-19 DIAGNOSIS — Z5189 Encounter for other specified aftercare: Secondary | ICD-10-CM

## 2014-04-19 DIAGNOSIS — C7949 Secondary malignant neoplasm of other parts of nervous system: Secondary | ICD-10-CM

## 2014-04-19 DIAGNOSIS — C3491 Malignant neoplasm of unspecified part of right bronchus or lung: Secondary | ICD-10-CM

## 2014-04-19 DIAGNOSIS — C3410 Malignant neoplasm of upper lobe, unspecified bronchus or lung: Secondary | ICD-10-CM

## 2014-04-19 DIAGNOSIS — C7931 Secondary malignant neoplasm of brain: Secondary | ICD-10-CM

## 2014-04-19 LAB — TYPE AND SCREEN
ABO/RH(D): A POS
Antibody Screen: NEGATIVE
Unit division: 0
Unit division: 0

## 2014-04-19 MED ORDER — PEGFILGRASTIM INJECTION 6 MG/0.6ML
6.0000 mg | Freq: Once | SUBCUTANEOUS | Status: AC
  Administered 2014-04-19: 6 mg via SUBCUTANEOUS

## 2014-04-19 NOTE — Patient Instructions (Signed)
Pegfilgrastim injection What is this medicine? PEGFILGRASTIM (peg fil GRA stim) is a long-acting granulocyte colony-stimulating factor that stimulates the growth of neutrophils, a type of white blood cell important in the body's fight against infection. It is used to reduce the incidence of fever and infection in patients with certain types of cancer who are receiving chemotherapy that affects the bone marrow. This medicine may be used for other purposes; ask your health care provider or pharmacist if you have questions. COMMON BRAND NAME(S): Neulasta What should I tell my health care provider before I take this medicine? They need to know if you have any of these conditions: -latex allergy -ongoing radiation therapy -sickle cell disease -skin reactions to acrylic adhesives (On-Body Injector only) -an unusual or allergic reaction to pegfilgrastim, filgrastim, other medicines, foods, dyes, or preservatives -pregnant or trying to get pregnant -breast-feeding How should I use this medicine? This medicine is for injection under the skin. If you get this medicine at home, you will be taught how to prepare and give the pre-filled syringe or how to use the On-body Injector. Refer to the patient Instructions for Use for detailed instructions. Use exactly as directed. Take your medicine at regular intervals. Do not take your medicine more often than directed. It is important that you put your used needles and syringes in a special sharps container. Do not put them in a trash can. If you do not have a sharps container, call your pharmacist or healthcare provider to get one. Talk to your pediatrician regarding the use of this medicine in children. Special care may be needed. Overdosage: If you think you have taken too much of this medicine contact a poison control center or emergency room at once. NOTE: This medicine is only for you. Do not share this medicine with others. What if I miss a dose? It is  important not to miss your dose. Call your doctor or health care professional if you miss your dose. If you miss a dose due to an On-body Injector failure or leakage, a new dose should be administered as soon as possible using a single prefilled syringe for manual use. What may interact with this medicine? Interactions have not been studied. Give your health care provider a list of all the medicines, herbs, non-prescription drugs, or dietary supplements you use. Also tell them if you smoke, drink alcohol, or use illegal drugs. Some items may interact with your medicine. This list may not describe all possible interactions. Give your health care provider a list of all the medicines, herbs, non-prescription drugs, or dietary supplements you use. Also tell them if you smoke, drink alcohol, or use illegal drugs. Some items may interact with your medicine. What should I watch for while using this medicine? You may need blood work done while you are taking this medicine. If you are going to need a MRI, CT scan, or other procedure, tell your doctor that you are using this medicine (On-Body Injector only). What side effects may I notice from receiving this medicine? Side effects that you should report to your doctor or health care professional as soon as possible: -allergic reactions like skin rash, itching or hives, swelling of the face, lips, or tongue -dizziness -fever -pain, redness, or irritation at site where injected -pinpoint red spots on the skin -shortness of breath or breathing problems -stomach or side pain, or pain at the shoulder -swelling -tiredness -trouble passing urine Side effects that usually do not require medical attention (report to your doctor   or health care professional if they continue or are bothersome): -bone pain -muscle pain This list may not describe all possible side effects. Call your doctor for medical advice about side effects. You may report side effects to FDA at  1-800-FDA-1088. Where should I keep my medicine? Keep out of the reach of children. Store pre-filled syringes in a refrigerator between 2 and 8 degrees C (36 and 46 degrees F). Do not freeze. Keep in carton to protect from light. Throw away this medicine if it is left out of the refrigerator for more than 48 hours. Throw away any unused medicine after the expiration date. NOTE: This sheet is a summary. It may not cover all possible information. If you have questions about this medicine, talk to your doctor, pharmacist, or health care provider.  2015, Elsevier/Gold Standard. (2013-11-14 16:14:05)  

## 2014-04-19 NOTE — Progress Notes (Signed)
Patient declines AVS printout.

## 2014-04-23 ENCOUNTER — Other Ambulatory Visit (HOSPITAL_BASED_OUTPATIENT_CLINIC_OR_DEPARTMENT_OTHER): Payer: 59

## 2014-04-23 DIAGNOSIS — C3410 Malignant neoplasm of upper lobe, unspecified bronchus or lung: Secondary | ICD-10-CM

## 2014-04-23 DIAGNOSIS — C7931 Secondary malignant neoplasm of brain: Secondary | ICD-10-CM

## 2014-04-23 DIAGNOSIS — C3490 Malignant neoplasm of unspecified part of unspecified bronchus or lung: Secondary | ICD-10-CM

## 2014-04-23 DIAGNOSIS — C7949 Secondary malignant neoplasm of other parts of nervous system: Secondary | ICD-10-CM

## 2014-04-23 LAB — CBC WITH DIFFERENTIAL/PLATELET
BASO%: 0.9 % (ref 0.0–2.0)
BASOS ABS: 0 10*3/uL (ref 0.0–0.1)
EOS%: 0.4 % (ref 0.0–7.0)
Eosinophils Absolute: 0 10*3/uL (ref 0.0–0.5)
HEMATOCRIT: 30.5 % — AB (ref 34.8–46.6)
HEMOGLOBIN: 10 g/dL — AB (ref 11.6–15.9)
LYMPH#: 0.4 10*3/uL — AB (ref 0.9–3.3)
LYMPH%: 10.1 % — ABNORMAL LOW (ref 14.0–49.7)
MCH: 30.4 pg (ref 25.1–34.0)
MCHC: 32.8 g/dL (ref 31.5–36.0)
MCV: 92.9 fL (ref 79.5–101.0)
MONO#: 0.1 10*3/uL (ref 0.1–0.9)
MONO%: 2.8 % (ref 0.0–14.0)
NEUT#: 3.6 10*3/uL (ref 1.5–6.5)
NEUT%: 85.8 % — AB (ref 38.4–76.8)
Platelets: 154 10*3/uL (ref 145–400)
RBC: 3.29 10*6/uL — ABNORMAL LOW (ref 3.70–5.45)
RDW: 19.6 % — ABNORMAL HIGH (ref 11.2–14.5)
WBC: 4.2 10*3/uL (ref 3.9–10.3)

## 2014-04-23 LAB — COMPREHENSIVE METABOLIC PANEL (CC13)
ALT: 50 U/L (ref 0–55)
AST: 29 U/L (ref 5–34)
Albumin: 4.3 g/dL (ref 3.5–5.0)
Alkaline Phosphatase: 73 U/L (ref 40–150)
Anion Gap: 9 mEq/L (ref 3–11)
BUN: 19.9 mg/dL (ref 7.0–26.0)
CALCIUM: 9.7 mg/dL (ref 8.4–10.4)
CHLORIDE: 106 meq/L (ref 98–109)
CO2: 26 mEq/L (ref 22–29)
CREATININE: 0.7 mg/dL (ref 0.6–1.1)
Glucose: 130 mg/dl (ref 70–140)
Potassium: 4.2 mEq/L (ref 3.5–5.1)
Sodium: 141 mEq/L (ref 136–145)
Total Bilirubin: 0.54 mg/dL (ref 0.20–1.20)
Total Protein: 7.1 g/dL (ref 6.4–8.3)

## 2014-05-01 ENCOUNTER — Telehealth: Payer: Self-pay | Admitting: Medical Oncology

## 2014-05-01 NOTE — Telephone Encounter (Signed)
I was told by Hinton Dyer that pt called reportign trouble with some swelling around her eyes. I called pts phone and left voice message to cal me back.

## 2014-05-02 ENCOUNTER — Telehealth: Payer: Self-pay | Admitting: *Deleted

## 2014-05-02 ENCOUNTER — Ambulatory Visit (HOSPITAL_COMMUNITY)
Admission: RE | Admit: 2014-05-02 | Discharge: 2014-05-02 | Disposition: A | Discharge status: Home / Self Care | Payer: 59 | Source: Ambulatory Visit | Attending: Diagnostic Radiology | Admitting: Diagnostic Radiology

## 2014-05-02 ENCOUNTER — Encounter (HOSPITAL_COMMUNITY): Payer: Self-pay

## 2014-05-02 ENCOUNTER — Ambulatory Visit (HOSPITAL_BASED_OUTPATIENT_CLINIC_OR_DEPARTMENT_OTHER): Payer: 59 | Admitting: Nurse Practitioner

## 2014-05-02 ENCOUNTER — Telehealth: Payer: Self-pay | Admitting: Internal Medicine

## 2014-05-02 VITALS — BP 136/81 | HR 79 | Temp 98.6°F | Resp 18 | Ht 66.0 in | Wt 178.7 lb

## 2014-05-02 DIAGNOSIS — R220 Localized swelling, mass and lump, head: Secondary | ICD-10-CM

## 2014-05-02 DIAGNOSIS — C3490 Malignant neoplasm of unspecified part of unspecified bronchus or lung: Secondary | ICD-10-CM | POA: Diagnosis not present

## 2014-05-02 DIAGNOSIS — C7949 Secondary malignant neoplasm of other parts of nervous system: Secondary | ICD-10-CM

## 2014-05-02 DIAGNOSIS — C7970 Secondary malignant neoplasm of unspecified adrenal gland: Secondary | ICD-10-CM | POA: Diagnosis not present

## 2014-05-02 DIAGNOSIS — C3410 Malignant neoplasm of upper lobe, unspecified bronchus or lung: Secondary | ICD-10-CM

## 2014-05-02 DIAGNOSIS — R221 Localized swelling, mass and lump, neck: Secondary | ICD-10-CM

## 2014-05-02 DIAGNOSIS — C7931 Secondary malignant neoplasm of brain: Secondary | ICD-10-CM

## 2014-05-02 MED ORDER — IOHEXOL 300 MG/ML  SOLN
100.0000 mL | Freq: Once | INTRAMUSCULAR | Status: AC | PRN
  Administered 2014-05-02: 100 mL via INTRAVENOUS

## 2014-05-02 NOTE — Telephone Encounter (Signed)
Nelsa request pt to be added as walkin. for CB.done.Marland KitchenMarland Kitchen

## 2014-05-02 NOTE — Telephone Encounter (Signed)
   Provider input needed: FACIAL SWELLING   Reason for call: PT. Carol Bond AFTER HER CT BODY SCAN WITH FACIAL SWELLING.  Eyes: positive for SWELLING SINCE 04/26/14. IT IS WORSE IN THE MORNING. ALSO SWELLING CHEEK AREAS. PT. HAS HAD PAIN AND SORENESS OVER HER LEFT EYE.    ALLERGIES:  is allergic to actos and vicodin.  Patient last received chemotherapy/ treatment on 04/16/14.  Patient was last seen in the office on 04/16/14.  Next appt is 05/07/14 WITH DR.MOHAMED.  Is patient having fevers greater than 100.5?  no   Is patient having uncontrolled pain, or new pain?NEW PAIN    Is patient having new back pain that changes with position (worsens or eases when laying down?)  N/A   Is patient able to eat and drink? yes    Is patient able to pass stool without difficulty?   N/A     Is patient having uncontrolled nausea?  N/A    patient calls 05/02/2014 with complaint of  AS ABOVE   Summary Based on the above information advised PT. TO WAIT TO BE SEEN BY CINDEE BACON,NP.    Waverly Ferrari M  05/02/2014, 2:10 PM   Background Info  Carol Bond   DOB: [birth year]   MR#: [id number]   CSN#   [id number] 05/02/2014

## 2014-05-07 ENCOUNTER — Other Ambulatory Visit (HOSPITAL_BASED_OUTPATIENT_CLINIC_OR_DEPARTMENT_OTHER): Payer: 59

## 2014-05-07 ENCOUNTER — Ambulatory Visit (HOSPITAL_BASED_OUTPATIENT_CLINIC_OR_DEPARTMENT_OTHER): Payer: 59 | Admitting: Internal Medicine

## 2014-05-07 ENCOUNTER — Encounter: Payer: Self-pay | Admitting: Nurse Practitioner

## 2014-05-07 ENCOUNTER — Other Ambulatory Visit: Payer: Self-pay | Admitting: Medical Oncology

## 2014-05-07 ENCOUNTER — Telehealth: Payer: Self-pay | Admitting: Internal Medicine

## 2014-05-07 ENCOUNTER — Encounter: Payer: Self-pay | Admitting: Internal Medicine

## 2014-05-07 VITALS — BP 129/66 | HR 80 | Temp 98.0°F | Resp 18 | Ht 66.0 in | Wt 177.0 lb

## 2014-05-07 DIAGNOSIS — R221 Localized swelling, mass and lump, neck: Secondary | ICD-10-CM

## 2014-05-07 DIAGNOSIS — C3490 Malignant neoplasm of unspecified part of unspecified bronchus or lung: Secondary | ICD-10-CM

## 2014-05-07 DIAGNOSIS — R220 Localized swelling, mass and lump, head: Secondary | ICD-10-CM | POA: Insufficient documentation

## 2014-05-07 DIAGNOSIS — C3410 Malignant neoplasm of upper lobe, unspecified bronchus or lung: Secondary | ICD-10-CM

## 2014-05-07 DIAGNOSIS — Z23 Encounter for immunization: Secondary | ICD-10-CM

## 2014-05-07 DIAGNOSIS — C7949 Secondary malignant neoplasm of other parts of nervous system: Secondary | ICD-10-CM

## 2014-05-07 DIAGNOSIS — C7931 Secondary malignant neoplasm of brain: Secondary | ICD-10-CM

## 2014-05-07 DIAGNOSIS — R609 Edema, unspecified: Secondary | ICD-10-CM

## 2014-05-07 LAB — CBC WITH DIFFERENTIAL/PLATELET
BASO%: 0.8 % (ref 0.0–2.0)
Basophils Absolute: 0.1 10*3/uL (ref 0.0–0.1)
EOS%: 0.5 % (ref 0.0–7.0)
Eosinophils Absolute: 0 10*3/uL (ref 0.0–0.5)
HEMATOCRIT: 26.9 % — AB (ref 34.8–46.6)
HGB: 8.7 g/dL — ABNORMAL LOW (ref 11.6–15.9)
LYMPH%: 8.3 % — ABNORMAL LOW (ref 14.0–49.7)
MCH: 30.9 pg (ref 25.1–34.0)
MCHC: 32.3 g/dL (ref 31.5–36.0)
MCV: 95.7 fL (ref 79.5–101.0)
MONO#: 0.7 10*3/uL (ref 0.1–0.9)
MONO%: 10.8 % (ref 0.0–14.0)
NEUT#: 5.5 10*3/uL (ref 1.5–6.5)
NEUT%: 79.6 % — AB (ref 38.4–76.8)
Platelets: 223 10*3/uL (ref 145–400)
RBC: 2.81 10*6/uL — ABNORMAL LOW (ref 3.70–5.45)
RDW: 21 % — ABNORMAL HIGH (ref 11.2–14.5)
WBC: 6.9 10*3/uL (ref 3.9–10.3)
lymph#: 0.6 10*3/uL — ABNORMAL LOW (ref 0.9–3.3)

## 2014-05-07 LAB — COMPREHENSIVE METABOLIC PANEL (CC13)
ALT: 41 U/L (ref 0–55)
ANION GAP: 11 meq/L (ref 3–11)
AST: 26 U/L (ref 5–34)
Albumin: 4.1 g/dL (ref 3.5–5.0)
Alkaline Phosphatase: 61 U/L (ref 40–150)
BUN: 12.3 mg/dL (ref 7.0–26.0)
CALCIUM: 9.8 mg/dL (ref 8.4–10.4)
CHLORIDE: 106 meq/L (ref 98–109)
CO2: 24 mEq/L (ref 22–29)
Creatinine: 0.7 mg/dL (ref 0.6–1.1)
Glucose: 134 mg/dl (ref 70–140)
Potassium: 4 mEq/L (ref 3.5–5.1)
Sodium: 141 mEq/L (ref 136–145)
TOTAL PROTEIN: 7.1 g/dL (ref 6.4–8.3)
Total Bilirubin: 0.43 mg/dL (ref 0.20–1.20)

## 2014-05-07 MED ORDER — FUROSEMIDE 20 MG PO TABS: 20.0000 mg | ORAL_TABLET | Freq: Every day | ORAL | Status: DC | PRN

## 2014-05-07 MED ORDER — INFLUENZA VAC SPLIT QUAD 0.5 ML IM SUSY
0.5000 mL | PREFILLED_SYRINGE | Freq: Once | INTRAMUSCULAR | Status: AC
  Administered 2014-05-07: 0.5 mL via INTRAMUSCULAR
  Filled 2014-05-07: qty 0.5

## 2014-05-07 NOTE — Telephone Encounter (Signed)
Pt confirmed labs/ov per 09/09 POF, gave pt AVS, gave pt barium......KJ

## 2014-05-07 NOTE — Progress Notes (Signed)
[facility] [phone]   [phone]  OFFICE PROGRESS NOTE  [name],[name], [place] [address]  DIAGNOSIS: Lung cancer  Primary site: Lung (Left)  Staging method: AJCC 7th Edition  Clinical free text: Extensive stage small cell lung cancer  Clinical: Stage IV (T3, N2, M1b) signed by Curt Bears, MD on 12/17/2013 7:45 PM  Summary: Stage IV (T3, N2, M1b)   PRIOR THERAPY:Whole brain irradiation under the care of Dr. Sondra Come   CURRENT THERAPY:  Systemic chemotherapy with carboplatin for an AUC of 5 given on day 1, etoposide at 120 mg per meter square given on days 1, 2 and 3 with Neulasta support given on day 4. Status post 6 cycles.  DISEASE STAGE: Lung cancer  Primary site: Lung (Left)  Staging method: AJCC 7th Edition  Clinical free text: Extensive stage small cell lung cancer  Clinical: Stage IV (T3, N2, M1b) signed by Curt Bears, MD on 12/17/2013 7:45 PM  Summary: Stage IV (T3, N2, M1b)  CHEMOTHERAPY INTENT: Palliative  CURRENT # OF CHEMOTHERAPY CYCLES: 6 CURRENT ANTIEMETICS: none  CURRENT SMOKING STATUS: Former smoker, quit July 2014  ORAL CHEMOTHERAPY AND CONSENT: n/a  CURRENT BISPHOSPHONATES USE: none  PAIN MANAGEMENT: oxycodone  NARCOTICS INDUCED CONSTIPATION: none  LIVING WILL AND CODE STATUS:    INTERVAL HISTORY: Carol Bond 58 y.o. female returns to the clinic today for followup visit accompanied by her husband and daughter. She completed 6 cycles of her systemic chemotherapy with carboplatin and etoposide and tolerated the treatment fairly well except for fatigue. She continues to have occasional nausea and she is currently on several anti-emetics including Compazine, Phenergan, Zofran and Ativan. She also has some puffiness of her eyelids especially in the morning hours started a few days ago. She denied having any significant weight loss or night sweats. She has no fever or chills.  She denied having any significant chest pain, shortness of breath, cough or hemoptysis. She had repeat CT scan of the chest, abdomen and pelvis performed recently and she is here for evaluation and discussion of her scan results.  MEDICAL HISTORY: Past Medical History  Diagnosis Date  . Pulmonary embolism   . Hypothyroid   . Diabetes   . Cancer     Lung  . Radiation 12/18/13-12/31/13    whole brain 30 gray, left central chest 30 gray    ALLERGIES:  is allergic to actos and vicodin.  MEDICATIONS:  Current Outpatient Prescriptions  Medication Sig Dispense Refill  . albuterol (PROVENTIL HFA;VENTOLIN HFA) 108 (90 BASE) MCG/ACT inhaler Inhale 2 puffs into the lungs every 6 (six) hours as needed for wheezing or shortness of breath.  1 Inhaler  2  . aspirin 81 MG tablet Take 162 mg by mouth once.      Marland Kitchen atorvastatin (LIPITOR) 40 MG tablet Take 40 mg by mouth daily.      . beclomethasone (QVAR) 80 MCG/ACT inhaler Inhale 2 puffs into the lungs at bedtime.      . betamethasone dipropionate (DIPROLENE) 0.05 % cream Apply 1 application topically daily as needed.      . Canagliflozin (INVOKANA) 300 MG TABS Take 1 tablet by mouth daily before breakfast.      . ciclopirox (PENLAC) 8 % solution       . emollient (BIAFINE) cream Apply topically 2 (two) times daily.      Marland Kitchen escitalopram (LEXAPRO) 10 MG tablet Take 10 mg by mouth  daily.      . esomeprazole (NEXIUM) 40 MG capsule Take 40 mg by mouth daily at 12 noon.      . fenofibrate 160 MG tablet Take 160 mg by mouth daily.      Marland Kitchen glimepiride (AMARYL) 4 MG tablet Take 4 mg by mouth daily with breakfast.      . insulin glargine (LANTUS) 100 unit/mL SOPN Inject 0.05 mLs (5 Units total) into the skin at bedtime. Solostar Pen  15 mL  11  . levothyroxine (SYNTHROID, LEVOTHROID) 150 MCG tablet Take 150 mcg by mouth daily before breakfast.      . lidocaine-prilocaine (EMLA) cream Apply 1 application topically as needed. Apply to port a cath site 90 minutes  prior to chemotherapy appointment.  30 g  0  . loratadine (CLARITIN) 10 MG tablet Take 10 mg by mouth daily.      Marland Kitchen LORazepam (ATIVAN) 1 MG tablet Take 1 mg by mouth.      Marland Kitchen LOSARTAN POTASSIUM PO Take 50 mg by mouth daily.      . ondansetron (ZOFRAN) 8 MG tablet Take 1 tablet (8 mg total) by mouth every 8 (eight) hours as needed for nausea or vomiting.  30 tablet  1  . Oxycodone HCl 10 MG TABS Take 10 mg by mouth.      . prochlorperazine (COMPAZINE) 10 MG tablet Take 1 tablet (10 mg total) by mouth every 6 (six) hours as needed for nausea or vomiting.  30 tablet  2  . promethazine (PHENERGAN) 12.5 MG tablet Take 1 or 2 tablets by mouth every 6 hours as needed for nausea  30 tablet  0  . sitaGLIPtin-metformin (JANUMET) 50-1000 MG per tablet Take 1 tablet by mouth 2 (two) times daily with a meal.      . triamcinolone (NASACORT AQ) 55 MCG/ACT AERO nasal inhaler Place 1 spray into the nose daily.       No current facility-administered medications for this visit.    SURGICAL HISTORY:  Past Surgical History  Procedure Laterality Date  . Abdominal hysterectomy    . Spine surgery    . Cervical fusion    . Video bronchoscopy with endobronchial ultrasound N/A 12/13/2013    Procedure: VIDEO BRONCHOSCOPY WITH ENDOBRONCHIAL ULTRASOUND;  Surgeon: Grace Isaac, MD;  Location: [facility];  Service: Thoracic;  Laterality: N/A;  . Portacath placement Left 12/13/2013    Procedure: INSERTION PORT-A-CATH;  Surgeon: Grace Isaac, MD;  Location: [facility];  Service: Thoracic;  Laterality: Left;    REVIEW OF SYSTEMS:  Constitutional: positive for fatigue Eyes: negative Ears, nose, mouth, throat, and face: negative Respiratory: positive for dyspnea on exertion Cardiovascular: negative Gastrointestinal: negative Genitourinary:negative Integument/breast: negative Hematologic/lymphatic: negative Musculoskeletal:negative Neurological: negative Behavioral/Psych: negative Endocrine:  negative Allergic/Immunologic: negative   PHYSICAL EXAMINATION: General appearance: alert, cooperative, fatigued and no distress Head: Normocephalic, without obvious abnormality, atraumatic Neck: no adenopathy, no JVD, supple, symmetrical, trachea midline and thyroid not enlarged, symmetric, no tenderness/mass/nodules Lymph nodes: Cervical, supraclavicular, and axillary nodes normal. Resp: clear to auscultation bilaterally Back: symmetric, no curvature. ROM normal. No CVA tenderness. Cardio: regular rate and rhythm, S1, S2 normal, no murmur, click, rub or gallop GI: soft, non-tender; bowel sounds normal; no masses,  no organomegaly Extremities: extremities normal, atraumatic, no cyanosis or edema Neurologic: Alert and oriented X 3, normal strength and tone. Normal symmetric reflexes. Normal coordination and gait  ECOG PERFORMANCE STATUS: 1 - Symptomatic but completely ambulatory  Blood pressure 129/66, pulse 80, temperature  23 F (36.7 C), temperature source Oral, resp. rate 18, height 5\' 6"  (1.676 m), weight 177 lb (80.287 kg).  LABORATORY DATA: Lab Results  Component Value Date   WBC 6.9 05/07/2014   HGB 8.7* 05/07/2014   HCT 26.9* 05/07/2014   MCV 95.7 05/07/2014   PLT 223 05/07/2014      Chemistry      Component Value Date/Time   NA 141 04/23/2014 1305   NA 140 12/19/2013 0320   K 4.2 04/23/2014 1305   K 4.3 12/19/2013 0320   CL 100 12/19/2013 0320   CO2 26 04/23/2014 1305   CO2 28 12/19/2013 0320   BUN 19.9 04/23/2014 1305   BUN 21 12/19/2013 0320   CREATININE 0.7 04/23/2014 1305   CREATININE 0.78 12/19/2013 0320      Component Value Date/Time   CALCIUM 9.7 04/23/2014 1305   CALCIUM 9.7 12/19/2013 0320   ALKPHOS 73 04/23/2014 1305   ALKPHOS 54 12/19/2013 0320   AST 29 04/23/2014 1305   AST 24 12/19/2013 0320   ALT 50 04/23/2014 1305   ALT 48* 12/19/2013 0320   BILITOT 0.54 04/23/2014 1305   BILITOT <0.2* 12/19/2013 0320       RADIOGRAPHIC STUDIES: Ct Chest W Contrast  05/02/2014    CLINICAL DATA:  Small cell lung cancer.  EXAM: CT CHEST, ABDOMEN, AND PELVIS WITH CONTRAST  TECHNIQUE: Multidetector CT imaging of the chest, abdomen and pelvis was performed following the standard protocol during bolus administration of intravenous contrast.  CONTRAST:  164mL OMNIPAQUE IOHEXOL 300 MG/ML  SOLN  COMPARISON:  03/19/2014  FINDINGS: CT CHEST FINDINGS  There is no pleural effusion. Changes of external beam radiation identified within the left lung no measurable disease identified within the left lung. There is no suspicious pulmonary nodule or mass within the right lung.  The heart size is normal. Small pericardial effusion noted. Similar appearance of residual left perihilar soft tissue measuring 9 mm, image 19/series 2. No enlarged mediastinal or hilar lymph nodes. There is no axillary or supraclavicular adenopathy.  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 No additional liver abnormalities identified. The gallbladder appears normal. No biliary dilatation. Normal appearance of the pancreas. The gallbladder appears normal. No biliary dilatation. Normal appearance of the pancreas and the spleen.  Previous index lesion within the left adrenal gland measures 1.2 x 0.7 cm, image 53/series 2. The kidneys are both unremarkable. The urinary bladder appears normal. Previous hysterectomy.  Calcified atherosclerotic disease involves the normal caliber abdominal aorta. There is no retroperitoneal adenopathy. No mesenteric adenopathy. There is no pelvic or inguinal adenopathy. No free fluid or fluid collections noted.  The stomach is normal. The small bowel loops have a normal course and caliber. No obstruction. Normal appearance of the proximal colon. There is multiple distal colonic diverticula without acute inflammation.  Review of the visualized osseous structures is negative for aggressive lytic or sclerotic  bone lesion.  IMPRESSION: 1. No acute findings within the chest, abdomen or pelvis. 2. Stable appearance of left perihilar soft tissue, which may reflect treated tumor. 3. 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 4. No new or progressive disease identified within the chest abdomen or pelvis.   Electronically Signed   By: Kerby Moors M.D.   On: 05/02/2014 14:34   Ct Abdomen Pelvis W Contrast  05/02/2014   CLINICAL DATA:  Small cell lung cancer.  EXAM: CT CHEST, ABDOMEN, AND PELVIS WITH CONTRAST  TECHNIQUE: Multidetector CT imaging of the chest, abdomen and pelvis was performed following the standard protocol during bolus administration of intravenous contrast.  CONTRAST:  140mL OMNIPAQUE IOHEXOL 300 MG/ML  SOLN  COMPARISON:  03/19/2014  FINDINGS: CT CHEST FINDINGS  There is no pleural effusion. Changes of external beam radiation identified within the left lung no measurable disease identified within the left lung. There is no suspicious pulmonary nodule or mass within the right lung.  The heart size is normal. Small pericardial effusion noted. Similar appearance of residual left perihilar soft tissue measuring 9 mm, image 19/series 2. No enlarged mediastinal or hilar lymph nodes. There is no axillary or supraclavicular adenopathy.  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 No additional liver abnormalities identified. The gallbladder appears normal. No biliary dilatation. Normal appearance of the pancreas. The gallbladder appears normal. No biliary dilatation. Normal appearance of the pancreas and the spleen.  Previous index lesion within the left adrenal gland measures 1.2 x 0.7 cm, image 53/series 2. The kidneys are both unremarkable. The urinary bladder appears normal. Previous hysterectomy.  Calcified atherosclerotic disease involves the normal caliber abdominal aorta. There is no  retroperitoneal adenopathy. No mesenteric adenopathy. There is no pelvic or inguinal adenopathy. No free fluid or fluid collections noted.  The stomach is normal. The small bowel loops have a normal course and caliber. No obstruction. Normal appearance of the proximal colon. There is multiple distal colonic diverticula without acute inflammation.  Review of the visualized osseous structures is negative for aggressive lytic or sclerotic bone lesion.  IMPRESSION: 1. No acute findings within the chest, abdomen or pelvis. 2. Stable appearance of left perihilar soft tissue, which may reflect treated tumor. 3. 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 4. No new or progressive disease identified within the chest abdomen or pelvis.   Electronically Signed   By: Kerby Moors M.D.   On: 05/02/2014 14:34   ASSESSMENT AND PLAN: This is a very pleasant 58 years old white female recently diagnosed with extensive stage small cell lung cancer status post whole brain irradiation in addition to 6 cycles of systemic chemotherapy with carboplatin and etoposide. She continues to have improvement in her disease.  I discussed the scan results and showed the images to the patient and her family. I recommended for her to continue on observation for now with repeat CT scan of the head, chest, abdomen and pelvis in 2 months for reevaluation of her disease. For the nausea, she would continue with Compazine, Zofran and Ativan. For the puffiness of her eyelids, I will start the patient on Lasix 20 mg by mouth daily as needed for swelling and puffiness of her eyes. She was advised to call immediately if she has any concerning symptoms in the interval. The patient voices understanding of current disease status and treatment options and is in agreement with the current care plan.  All questions were answered. The patient knows to call the clinic with any problems, questions or concerns. We can certainly see the patient  much sooner if necessary.  I spent 15 minutes counseling the patient face to face. The total time spent in the appointment was 25 minutes.  Disclaimer: This note was dictated with voice recognition software. Similar sounding words can inadvertently be transcribed and may not be corrected upon review.

## 2014-05-07 NOTE — Progress Notes (Signed)
[name]   Chief Complaint  Patient presents with  . Facial Swelling    HPI: Carol Bond 58 y.o. female diagnosed with lung cancer; with brain metastasis.  Recently completed 6 cycles of carboplatin/etoposide chemotherapy regimen.  Also is status post whole brain irradiation completed 12/31/2013.  The patient presented as a walk-in this morning to the symptom management clinic; complaining of approximately days of facial swelling.  She states the swelling is much worse when she first wakes up in the morning.  She denies any headaches or vision changes.  She also denies any throat swelling or airway difficulties.  She denies any sinus pain or pressure; chills denies any ear pain.  She denies any recent fevers or chills.  She does note that she has one area directly above her left eyebrow that is occasionally tender to palpation only.  She denies any specific headaches at this time.  She states that she has not taken any medications to prevent the swelling.  She states it is slowly improving on a daily basis.   HPI  CURRENT THERAPY: Upcoming Treatment Dates - LUNG SMALL CELL Carboplatin D1 / Etoposide D1-3 q21d Days with orders from any treatment category:  No upcoming days in selected categories.    ROS  Past Medical History  Diagnosis Date  . Pulmonary embolism   . Hypothyroid   . Diabetes   . Cancer     Lung  . Radiation 12/18/13-12/31/13    whole brain 30 gray, left central chest 30 gray    Past Surgical History  Procedure Laterality Date  . Abdominal hysterectomy    . Spine surgery    . Cervical fusion    . Video bronchoscopy with endobronchial ultrasound N/A 12/13/2013    Procedure: VIDEO BRONCHOSCOPY WITH ENDOBRONCHIAL ULTRASOUND;  Surgeon: Grace Isaac, MD;  Location: [facility];  Service: Thoracic;  Laterality: N/A;  . Portacath placement Left 12/13/2013    Procedure: INSERTION PORT-A-CATH;  Surgeon: Grace Isaac, MD;  Location: [facility];   Service: Thoracic;  Laterality: Left;    has Lung cancer; Pulmonary embolism; Hypothyroid; Diabetes; Pulmonary embolus; DOE (dyspnea on exertion); PE (pulmonary embolism); Abnormal finding on EKG - subtle inferior ST elevations; Brain metastases; Pulmonary artery thrombosis; and Facial swelling on her problem list.     is allergic to actos and vicodin.    Medication List       This list is accurate as of: 05/02/14 11:59 PM.  Always use your most recent med list.               albuterol 108 (90 BASE) MCG/ACT inhaler  Commonly known as:  PROVENTIL HFA;VENTOLIN HFA  Inhale 2 puffs into the lungs every 6 (six) hours as needed for wheezing or shortness of breath.     aspirin 81 MG tablet  Take 162 mg by mouth once.     atorvastatin 40 MG tablet  Commonly known as:  LIPITOR  Take 40 mg by mouth daily.     beclomethasone 80 MCG/ACT inhaler  Commonly known as:  QVAR  Inhale 2 puffs into the lungs at bedtime.     betamethasone dipropionate 0.05 % cream  Commonly known as:  DIPROLENE  Apply 1 application topically daily as needed.     ciclopirox 8 % solution  Commonly known as:  PENLAC     emollient cream  Commonly known as:  BIAFINE  Apply topically 2 (two) times daily.  escitalopram 10 MG tablet  Commonly known as:  LEXAPRO  Take 10 mg by mouth daily.     esomeprazole 40 MG capsule  Commonly known as:  NEXIUM  Take 40 mg by mouth daily at 12 noon.     fenofibrate 160 MG tablet  Take 160 mg by mouth daily.     glimepiride 4 MG tablet  Commonly known as:  AMARYL  Take 4 mg by mouth daily with breakfast.     insulin glargine 100 unit/mL Sopn  Commonly known as:  LANTUS  Inject 0.05 mLs (5 Units total) into the skin at bedtime. Solostar Pen     INVOKANA 300 MG Tabs  Generic drug:  Canagliflozin  Take 1 tablet by mouth daily before breakfast.     levothyroxine 150 MCG tablet  Commonly known as:  SYNTHROID, LEVOTHROID  Take 150 mcg by mouth daily before  breakfast.     lidocaine-prilocaine cream  Commonly known as:  EMLA  Apply 1 application topically as needed. Apply to port a cath site 90 minutes prior to chemotherapy appointment.     loratadine 10 MG tablet  Commonly known as:  CLARITIN  Take 10 mg by mouth daily.     LORazepam 1 MG tablet  Commonly known as:  ATIVAN  Take 1 mg by mouth.     LOSARTAN POTASSIUM PO  Take 50 mg by mouth daily.     NASACORT AQ 55 MCG/ACT Aero nasal inhaler  Generic drug:  triamcinolone  Place 1 spray into the nose daily.     ondansetron 8 MG tablet  Commonly known as:  ZOFRAN  Take 1 tablet (8 mg total) by mouth every 8 (eight) hours as needed for nausea or vomiting.     Oxycodone HCl 10 MG Tabs  Take 10 mg by mouth.     prochlorperazine 10 MG tablet  Commonly known as:  COMPAZINE  Take 1 tablet (10 mg total) by mouth every 6 (six) hours as needed for nausea or vomiting.     promethazine 12.5 MG tablet  Commonly known as:  PHENERGAN  Take 1 or 2 tablets by mouth every 6 hours as needed for nausea     sitaGLIPtin-metformin 50-1000 MG per tablet  Commonly known as:  JANUMET  Take 1 tablet by mouth 2 (two) times daily with a meal.         PHYSICAL EXAMINATION  Blood pressure 136/81, pulse 79, temperature 98.6 F (37 C), temperature source Oral, resp. rate 18, height 5\' 6"  (1.676 m), weight 178 lb 11.2 oz (81.058 kg), SpO2 98.00%.  Physical Exam  Nursing note and vitals reviewed. Constitutional: She is oriented to person, place, and time and well-developed, well-nourished, and in no distress. No distress.  HENT:  Head: Normocephalic and atraumatic.  Right Ear: External ear normal.  Left Ear: External ear normal.  Nose: Nose normal.  Mouth/Throat: Oropharynx is clear and moist.  Patient is noted to have some moderate facial swelling.  Oral mucosa with no lesions.  No erythema to throat.  Managing all secretions with no issues.  Eyes: Conjunctivae and EOM are normal. Pupils are  equal, round, and reactive to light. Right eye exhibits no discharge. Left eye exhibits no discharge. No scleral icterus.  Neck: Normal range of motion. Neck supple.  Cardiovascular: Normal rate, regular rhythm and intact distal pulses.  Exam reveals no friction rub.   No murmur heard. Pulmonary/Chest: Effort normal and breath sounds normal. No respiratory distress.  Abdominal: Soft.  Bowel sounds are normal. She exhibits no distension. There is no tenderness. There is no rebound.  Musculoskeletal: Normal range of motion. She exhibits no edema and no tenderness.  Lymphadenopathy:    She has no cervical adenopathy.  Neurological: She is alert and oriented to person, place, and time. Gait normal.  Skin: Skin is warm and dry. No rash noted. No erythema.  Psychiatric: Affect normal.   he Mohamed ASSESSMENT/PLAN:    Lung cancer  Assessment & Plan Patient is status post 6 cycles of carboplatin/etoposide chemotherapy regimen.  Patient just returned earlier this morning up from a restaging CT of the chest/abdomen/pelvis.  Patient has plans to meet with Dr. Julien Nordmann next week for followup of her scan results.   Facial swelling  Assessment & Plan Patient has received a total of 6 cycles of carboplatin/etoposide chemotherapy.  She completed brain radiation 12/31/2013.  Is fairly unlikely that she is just now exhibiting hypersensitivity symptoms to her chemotherapy.  The patient denies any new foods, perfumes, lotions, et Ronney Asters.  Patient denies any difficulty swallowing or airway issues.  Patient was advised to take Benadryl 25 mg every 6 hours and Pepcid 20 mg every 12 hours.  Patient was also advised to call or go directly to the emergency department if she develops any new or worsening symptoms whatsoever.   Patient stated understanding of all instructions; and was in agreement with this plan of care. The patient knows to call the clinic with any problems, questions or concerns.    Review/collaboration with Dr. Julien Nordmann regarding all aspects of patient's visit today.   Total time spent with patient was 25 minutes;  with greater than 75 percent of that time spent in face to face counseling regarding her symptoms, and coordination of care and follow up.  Disclaimer: This note was dictated with voice recognition software. Similar sounding words can inadvertently be transcribed and may not be corrected upon review.   Drue Second, NP 05/07/2014

## 2014-05-07 NOTE — Assessment & Plan Note (Signed)
Patient has received a total of 6 cycles of carboplatin/etoposide chemotherapy.  She completed brain radiation 12/31/2013.  Is fairly unlikely that she is just now exhibiting hypersensitivity symptoms to her chemotherapy.  The patient denies any new foods, perfumes, lotions, et Ronney Asters.  Patient denies any difficulty swallowing or airway issues.  Patient was advised to take Benadryl 25 mg every 6 hours and Pepcid 20 mg every 12 hours.  Patient was also advised to call or go directly to the emergency department if she develops any new or worsening symptoms whatsoever.

## 2014-05-07 NOTE — Assessment & Plan Note (Signed)
Patient is status post 6 cycles of carboplatin/etoposide chemotherapy regimen.  Patient just returned earlier this morning up from a restaging CT of the chest/abdomen/pelvis.  Patient has plans to meet with Dr. Julien Nordmann next week for followup of her scan results.

## 2014-05-21 ENCOUNTER — Telehealth: Payer: Self-pay | Admitting: *Deleted

## 2014-05-21 NOTE — Telephone Encounter (Signed)
Pt called stating she "has a cold".  Coughing up green sputum.  No fever.  Symptoms started about 4 days ago.  Per Dr Vista Mink, pt can go to PCP to be evaluated.  If she has any issues getting in with PCP, we can have her see Selena Lesser.  Called pt back with instructions.  No answer, left instructions on vm.

## 2014-06-04 ENCOUNTER — Other Ambulatory Visit: Payer: Self-pay | Admitting: Family Medicine

## 2014-06-04 DIAGNOSIS — R519 Headache, unspecified: Secondary | ICD-10-CM

## 2014-06-04 DIAGNOSIS — R51 Headache: Principal | ICD-10-CM

## 2014-06-10 ENCOUNTER — Ambulatory Visit
Admission: RE | Admit: 2014-06-10 | Discharge: 2014-06-10 | Disposition: A | Discharge status: Home / Self Care | Payer: 59 | Source: Ambulatory Visit | Attending: Family Medicine | Admitting: Family Medicine

## 2014-06-10 DIAGNOSIS — R519 Headache, unspecified: Secondary | ICD-10-CM

## 2014-06-10 DIAGNOSIS — R51 Headache: Principal | ICD-10-CM

## 2014-06-10 MED ORDER — GADOBENATE DIMEGLUMINE 529 MG/ML IV SOLN
16.0000 mL | Freq: Once | INTRAVENOUS | Status: AC | PRN
  Administered 2014-06-10: 16 mL via INTRAVENOUS

## 2014-06-11 ENCOUNTER — Encounter: Payer: Self-pay | Admitting: Radiation Oncology

## 2014-06-11 NOTE — Progress Notes (Signed)
Location/Histology of Brain Tumor: extensive stage small cell lung cancer now with Interval development of small bilateral parietal lobe enhancing lesions and anterior right temporal lobe enhancing lesion suspicious for a metastatic focus and possibility of metastatic lesion posterior lateral left frontal lobe however, this may represent a vessel as noted on axial and sagittal imaging.  Patient presented with symptoms of:  Fatigue and occasional nausea but, progression found on routine MRI   Past or anticipated interventions, if any, per neurosurgery: no  Past or anticipated interventions, if any, per medical oncology: yes, Systemic chemotherapy with carboplatin for an AUC of 5 given on day 1, etoposide at 120 mg per meter square given on days 1, 2 and 3 with Neulasta support given on day 4. Status post 6 cycles  Dose of Decadron, if applicable: no  Recent neurologic symptoms, if any:   Seizures: no  Headaches: no  Nausea: yes  Dizziness/ataxia: no  Difficulty with hand coordination: no  Focal numbness/weakness: no  Visual deficits/changes: yes, puffiness of eyelids  Confusion/Memory deficits: no  Painful bone metastases at present, if any: no  SAFETY ISSUES:  Prior radiation? Yes; whole brain irradiation under the care of Dr. Sondra Come  Pacemaker/ICD? no  Possible current pregnancy? no  Is the patient on methotrexate? no  Additional Complaints / other details: 58 year old female. Married with a daughter.

## 2014-06-12 ENCOUNTER — Ambulatory Visit
Admission: RE | Admit: 2014-06-12 | Discharge: 2014-06-12 | Disposition: A | Discharge status: Home / Self Care | Payer: 59 | Source: Ambulatory Visit

## 2014-06-12 ENCOUNTER — Encounter: Payer: Self-pay | Admitting: *Deleted

## 2014-06-12 ENCOUNTER — Other Ambulatory Visit: Payer: Self-pay | Admitting: Radiation Therapy

## 2014-06-12 ENCOUNTER — Ambulatory Visit
Admission: RE | Admit: 2014-06-12 | Discharge: 2014-06-12 | Disposition: A | Discharge status: Home / Self Care | Payer: 59 | Source: Ambulatory Visit | Attending: Radiation Oncology | Admitting: Radiation Oncology

## 2014-06-12 ENCOUNTER — Encounter: Payer: Self-pay | Admitting: Radiation Oncology

## 2014-06-12 ENCOUNTER — Telehealth: Payer: Self-pay | Admitting: *Deleted

## 2014-06-12 VITALS — BP 115/77 | HR 77 | Temp 97.7°F | Resp 12 | Wt 172.0 lb

## 2014-06-12 DIAGNOSIS — C7931 Secondary malignant neoplasm of brain: Secondary | ICD-10-CM | POA: Insufficient documentation

## 2014-06-12 DIAGNOSIS — C7949 Secondary malignant neoplasm of other parts of nervous system: Principal | ICD-10-CM

## 2014-06-12 DIAGNOSIS — Z510 Encounter for antineoplastic radiation therapy: Secondary | ICD-10-CM | POA: Diagnosis present

## 2014-06-12 DIAGNOSIS — C3490 Malignant neoplasm of unspecified part of unspecified bronchus or lung: Secondary | ICD-10-CM | POA: Diagnosis not present

## 2014-06-12 NOTE — Progress Notes (Addendum)
Location/Histology of Brain Tumor: extensive stage small cell lung cancer now with Interval development of small bilateral parietal lobe enhancing lesions and anterior right temporal lobe enhancing lesion suspicious for a metastatic focus and possibility of metastatic lesion posterior lateral left frontal lobe however, this may represent a vessel as noted on axial and sagittal imaging.  Patient presented with symptoms of:  Fatigue and occasional nausea but, progression found on routine MRI   Past or anticipated interventions, if any, per neurosurgery: no  Past or anticipated interventions, if any, per medical oncology: yes, Systemic chemotherapy with carboplatin for an AUC of 5 given on day 1, etoposide at 120 mg per meter square given on days 1, 2 and 3 with Neulasta support given on day 4. Status post 6 cycles  Dose of Decadron, if applicable: no  Recent neurologic symptoms, if any:   Seizures: no  Headaches: reports having headaches everyday but takes Naproxen 500mg  bid which has helped.  She reports headaches would wake her from sleep.  Location of headaches are over the frontal sinus area.    Nausea: yes  Dizziness/ataxia: no  Difficulty with hand coordination: no- upper and lower extremities strong and equal  Focal numbness/weakness: no  Visual deficits/changes: yes, puffiness of eyelids reports some pain over the left eye, no visual blurring.  Confusion/Memory deficits: yes, reports she gets confused and has moments of anomic aphasia  Painful bone metastases at present, if any: no  SAFETY ISSUES:  Prior radiation? Yes; whole brain irradiation under the care of Dr. Sondra Come  Pacemaker/ICD? no  Possible current pregnancy? no  Is the patient on methotrexate? no  Additional Complaints / other details: 58 year old female. Married with a daughter.

## 2014-06-12 NOTE — Telephone Encounter (Signed)
Spoke with Dr. Julien Nordmann today regarding patient's concerns.  He stated he is aware of new brain lesions and patient needs brain radiation.  Pt is set up.  He stated keep follow up CT Chest scan and appt the same.  I called patient back and gave her an update.  She understood.  She stated her kids are having a hard time with recent scans.  I told patient they could call me if needed.

## 2014-06-12 NOTE — Progress Notes (Signed)
Radiation Oncology         [phone] ________________________________  Name: STALEY BUDZINSKI MRN: [id number]  Date: 06/12/2014  DOB: 20-Jun-1956  Follow-Up Visit Note  CC: [name], MD  Curt Bears, MD  Diagnosis:   58 year old woman with 3 isolated recurrent brain metastases status post previous whole brain irradiation from primary small cell carcinoma lung cancer    ICD-9-CM ICD-10-CM  1. Brain metastases 198.3 C79.31    Interval Since Last Radiation:  5 months  Narrative:  The patient returns today to discuss possible treatment options for recurrent brain metastases..  The patient was diagnosed with extensive stage small cell lung cancer with 26 brain metastases in April 2015.  From April 22 through May 5 she received radiation to the Whole brain to a total dose of 30 gray in 10 fractions while simultaneously receiving radiation to the Left central chest 30 gray in 10 fractions the patient tolerated radiation treatment well and followup brain MRI in June 2015 showed resolution of brain metastases.  She also received a course of 6 cycles of systemic chemotherapy with carboplatin and etoposide. Through August 2015.  Recently, she's been experiencing headaches and underwent brain MRI.  That study performed on October 13 revealed interval development of small bilateral parietal lobe enhancing lesions consistent with metastatic disease with mild surrounding vasogenic edema. Grouping of left parietal metastatic lesions (at least four with largest measuring 8 mm) with solitary right parietal 6 mm enhancing lesion. Anterior right temporal lobe 6 x 3 x 2 mm enhancing lesion (series 10, image 32) does not appear to be pulsation artifact and is suspicious for a metastatic focus.  Involvement appears to represent 3 distinct sites in the brain, potentially amenable to stereotactic radiosurgery.                      ALLERGIES:  is allergic to actos and vicodin.  Meds: Current  Outpatient Prescriptions  Medication Sig Dispense Refill  . albuterol (PROVENTIL HFA;VENTOLIN HFA) 108 (90 BASE) MCG/ACT inhaler Inhale 2 puffs into the lungs every 6 (six) hours as needed for wheezing or shortness of breath.  1 Inhaler  2  . aspirin 81 MG tablet Take 162 mg by mouth once.      . betamethasone dipropionate (DIPROLENE) 0.05 % cream Apply 1 application topically daily as needed.      . Canagliflozin (INVOKANA) 300 MG TABS Take 1 tablet by mouth daily before breakfast.      . escitalopram (LEXAPRO) 10 MG tablet Take 10 mg by mouth daily.      Marland Kitchen esomeprazole (NEXIUM) 40 MG capsule Take 40 mg by mouth daily at 12 noon.      . fenofibrate 160 MG tablet Take 160 mg by mouth daily.      . furosemide (LASIX) 20 MG tablet Take 1 tablet (20 mg total) by mouth daily as needed.  15 tablet  0  . glimepiride (AMARYL) 4 MG tablet Take 4 mg by mouth daily with breakfast.      . levothyroxine (SYNTHROID, LEVOTHROID) 150 MCG tablet Take 150 mcg by mouth daily before breakfast.      . loratadine (CLARITIN) 10 MG tablet Take 10 mg by mouth daily.      Marland Kitchen LORazepam (ATIVAN) 1 MG tablet Take 1 mg by mouth.      Marland Kitchen LOSARTAN POTASSIUM PO Take 50 mg by mouth daily.      . naproxen (NAPROSYN) 500 MG tablet Take 500 mg  by mouth 2 (two) times daily with a meal.      . ondansetron (ZOFRAN) 8 MG tablet Take 1 tablet (8 mg total) by mouth every 8 (eight) hours as needed for nausea or vomiting.  30 tablet  1  . prochlorperazine (COMPAZINE) 10 MG tablet Take 1 tablet (10 mg total) by mouth every 6 (six) hours as needed for nausea or vomiting.  30 tablet  2  . promethazine (PHENERGAN) 12.5 MG tablet Take 1 or 2 tablets by mouth every 6 hours as needed for nausea  30 tablet  0  . sitaGLIPtin-metformin (JANUMET) 50-1000 MG per tablet Take 1 tablet by mouth 2 (two) times daily with a meal.      . triamcinolone (NASACORT AQ) 55 MCG/ACT AERO nasal inhaler Place 1 spray into the nose daily.      Marland Kitchen atorvastatin (LIPITOR)  40 MG tablet Take 40 mg by mouth daily.      . beclomethasone (QVAR) 80 MCG/ACT inhaler Inhale 2 puffs into the lungs at bedtime.      . diphenhydrAMINE (SOMINEX) 25 MG tablet Take 25 mg by mouth at bedtime as needed for sleep.      . famotidine (PEPCID) 20 MG tablet Take 20 mg by mouth 2 (two) times daily.      . insulin glargine (LANTUS) 100 unit/mL SOPN Inject 0.05 mLs (5 Units total) into the skin at bedtime. Solostar Pen  15 mL  11  . lidocaine-prilocaine (EMLA) cream Apply 1 application topically as needed. Apply to port a cath site 90 minutes prior to chemotherapy appointment.  30 g  0  . Oxycodone HCl 10 MG TABS Take 10 mg by mouth.       No current facility-administered medications for this encounter.    Physical Findings: The patient is in no acute distress. Patient is alert and oriented.  weight is 172 lb (78.019 kg). Her oral temperature is 97.7 F (36.5 C). Her blood pressure is 115/77 and her pulse is 77. Her respiration is 12 and oxygen saturation is 100%. Marland Kitchen Her head was normocephalic and atraumatic. She has limited hair regrowth on the scalp. Cranial nerves are grossly intact. Motor strength is intact in upper and lower extremities. Coordination and gait were normal. Speech was fluent articulate. Mood and affect are appropriate..    Lab Findings: Lab Results  Component Value Date   WBC 6.9 05/07/2014   HGB 8.7* 05/07/2014   HCT 26.9* 05/07/2014   MCV 95.7 05/07/2014   PLT 223 05/07/2014    @LASTCHEM @  Radiographic Findings: Mr Kizzie Fantasia Contrast  06/10/2014   ADDENDUM REPORT: 06/10/2014 12:08  ADDENDUM: Right occipital lobe developmental venous anomaly once again noted and felt to be an incidental finding.   Electronically Signed   By: Chauncey Cruel M.D.   On: 06/10/2014 12:08   06/10/2014   CLINICAL DATA:  58 year old female with history of small cell lung cancer and metastatic disease to brain. Post whole-brain radiation therapy and 6 cycles of chemotherapy. Subsequent  encounter.  EXAM: MRI HEAD WITHOUT AND WITH CONTRAST  TECHNIQUE: Multiplanar, multiecho pulse sequences of the brain and surrounding structures were obtained without and with intravenous contrast.  CONTRAST:  94mL MULTIHANCE GADOBENATE DIMEGLUMINE 529 MG/ML IV SOLN  COMPARISON:  02/10/2014 and 12/17/2013 brain MR.  FINDINGS: Interval development of small bilateral parietal lobe enhancing lesions consistent with metastatic disease with mild surrounding vasogenic edema. Grouping of left parietal metastatic lesions (at least four with largest measuring 8 mm)  with solitary right parietal 6 mm enhancing lesion. Anterior right temporal lobe 6 x 3 x 2 mm enhancing lesion (series 10, image 32) does not appear to be pulsation artifact and is suspicious for a metastatic focus.  Series 11, image 16 raises possibility of metastatic lesion posterior lateral left frontal lobe however, this may represent a vessel as noted on axial and sagittal imaging.  Tiny region are blood breakdown products posterior medial right frontal lobe consistent with result of treated metastatic disease.  Significant progression of white matter type changes supratentorial and infratentorial position most likely result of treatment of tumor.  No acute thrombotic infarct.  Bilateral mastoid air cell partial opacification without obstructing lesion of the eustachian tube noted.  Mild exophthalmos.  Bony overgrowth left aspect C1-2 articulation with impression upon the left posterior lateral aspect of the cord unchanged from 2006 postmyelogram CT.  Major intracranial vascular structures are patent.  Minimal paranasal sinus mucosal thickening.  IMPRESSION: Interval development of small bilateral parietal lobe enhancing lesions and anterior right temporal lobe enhancing lesion suspicious for a metastatic focus as detailed above.  Series 11, image 16 raises possibility of metastatic lesion posterior lateral left frontal lobe however, this may represent a  vessel as noted on axial and sagittal imaging.  Significant progression of white matter type changes supratentorial and infratentorial position most likely result of treatment of tumor.  No acute thrombotic infarct.  Bilateral mastoid air cell partial opacification without obstructing lesion of the eustachian tube noted.  These results will be called to the ordering clinician or representative by the Radiologist Assistant, and communication documented in the PACS or zVision Dashboard.  Electronically Signed: By: Chauncey Cruel M.D. On: 06/10/2014 11:34    Impression:  The patient has 3 isolated recurrent brain metastases status post previous whole brain radiation.  At this point, the patient would potentially benefit from radiotherapy. The options include repeat whole brain irradiation versus stereotactic radiosurgery. There are pros and cons associated with each of these potential treatment options. Repeat whole brain radiotherapy would treat the known metastatic deposits and help provide some reduction of risk for future brain metastases. However, repeat whole brain radiotherapy carries heightened potential risks including hair loss, subacute somnolence, and more substantial neurocognitive changes including a possible reduction in short-term memory. Whole brain radiotherapy also may carry a lower likelihood of long-term tumor control at the treatment sites because of the low-dose used. Stereotactic radiosurgery carries a higher likelihood for local tumor control at the targeted sites with lower associated risk for neurocognitive changes such as memory loss. However, the use of stereotactic radiosurgery in this setting may leave the patient at increased risk for new brain metastases elsewhere in the brain as high as 50-60%. Accordingly, patients who receive stereotactic radiosurgery in this setting should undergo ongoing surveillance imaging with brain MRI more frequently in order to identify and treat new small  brain metastases before they become symptomatic. Stereotactic radiosurgery does carry some different risks, including a risk of radionecrosis.  PLAN: Today, I reviewed the findings and workup thus far with the patient. We discussed the dilemma regarding repeat whole brain radiotherapy versus stereotactic radiosurgery. We discussed the pros and cons of each. We also discussed the logistics and delivery of each. We reviewed the results associated with each of the treatments described above. The patient seems to understand the treatment options and would like to proceed with stereotactic radiosurgery.  I spent 60 minutes minutes face to face with the patient and more than  50% of that time was spent in counseling and/or coordination of care.    _____________________________________  [name]. Tammi Klippel, M.D.

## 2014-06-12 NOTE — [facility] Oncology Navigator Note (Unsigned)
Patient called.  She stated she has three new lesions in her brain.  She is wondering what to do about the lungs and if she needs more chemo.  I will notify Dr. Julien Nordmann and desk nurse

## 2014-06-16 ENCOUNTER — Telehealth: Payer: Self-pay | Admitting: Radiation Oncology

## 2014-06-16 ENCOUNTER — Ambulatory Visit
Admission: RE | Admit: 2014-06-16 | Discharge: 2014-06-16 | Disposition: A | Discharge status: Home / Self Care | Payer: 59 | Source: Ambulatory Visit | Attending: Radiation Oncology | Admitting: Radiation Oncology

## 2014-06-16 DIAGNOSIS — C7931 Secondary malignant neoplasm of brain: Secondary | ICD-10-CM | POA: Diagnosis not present

## 2014-06-16 DIAGNOSIS — C7949 Secondary malignant neoplasm of other parts of nervous system: Secondary | ICD-10-CM

## 2014-06-16 LAB — BUN AND CREATININE (CC13)
BUN: 22 mg/dL (ref 7.0–26.0)
Creatinine: 0.8 mg/dL (ref 0.6–1.1)

## 2014-06-16 NOTE — Telephone Encounter (Signed)
Per Dr. Johny Shears order called in EMLA cream to [facility]. Phoned patient at home and left message this was done.

## 2014-06-18 ENCOUNTER — Ambulatory Visit
Admission: RE | Admit: 2014-06-18 | Discharge: 2014-06-18 | Disposition: A | Discharge status: Home / Self Care | Payer: 59 | Source: Ambulatory Visit | Attending: Radiation Oncology | Admitting: Radiation Oncology

## 2014-06-18 DIAGNOSIS — C7931 Secondary malignant neoplasm of brain: Secondary | ICD-10-CM

## 2014-06-18 DIAGNOSIS — C7949 Secondary malignant neoplasm of other parts of nervous system: Principal | ICD-10-CM

## 2014-06-18 MED ORDER — GADOBENATE DIMEGLUMINE 529 MG/ML IV SOLN
16.0000 mL | Freq: Once | INTRAVENOUS | Status: AC | PRN
  Administered 2014-06-18: 16 mL via INTRAVENOUS

## 2014-06-19 ENCOUNTER — Ambulatory Visit
Admission: RE | Admit: 2014-06-19 | Discharge: 2014-06-19 | Disposition: A | Discharge status: Home / Self Care | Payer: 59 | Source: Ambulatory Visit | Attending: Radiation Oncology | Admitting: Radiation Oncology

## 2014-06-19 ENCOUNTER — Other Ambulatory Visit: Payer: Self-pay | Admitting: Radiation Therapy

## 2014-06-19 ENCOUNTER — Telehealth: Payer: Self-pay | Admitting: *Deleted

## 2014-06-19 ENCOUNTER — Other Ambulatory Visit: Payer: Self-pay | Admitting: *Deleted

## 2014-06-19 VITALS — BP 121/75 | HR 95 | Resp 16 | Wt 174.5 lb

## 2014-06-19 DIAGNOSIS — C7931 Secondary malignant neoplasm of brain: Secondary | ICD-10-CM

## 2014-06-19 DIAGNOSIS — Z510 Encounter for antineoplastic radiation therapy: Secondary | ICD-10-CM | POA: Diagnosis not present

## 2014-06-19 MED ORDER — HEPARIN SOD (PORK) LOCK FLUSH 100 UNIT/ML IV SOLN
500.0000 [IU] | Freq: Once | INTRAVENOUS | Status: AC
  Administered 2014-06-19: 500 [IU] via INTRAVENOUS

## 2014-06-19 MED ORDER — SODIUM CHLORIDE 0.9 % IJ SOLN
10.0000 mL | Freq: Once | INTRAMUSCULAR | Status: AC
  Administered 2014-06-19: 10 mL via INTRAVENOUS

## 2014-06-19 NOTE — Progress Notes (Signed)
Flushed left chest power port with saline and heparin per protocol. Removed access. Applied bandaid over old access site. Patient tolerated well.

## 2014-06-19 NOTE — Progress Notes (Addendum)
Weight and vitals stable. Patient denies pain. BUN 22 and creatinine 0.8. Patient reports she allergic to actos. Denies having a pacemaker. Denies taking Januva. Accessed left upper chest power port on the first attempt. Excellent blood return. Flushed without difficulty. Patient tolerated well. Patient escorted to CT/Simulation.

## 2014-06-19 NOTE — Progress Notes (Signed)
  Radiation Oncology         (336) [phone] ________________________________  Name: Carol Bond MRN: [id number]  Date: 06/19/2014  DOB: 04/19/1956  SIMULATION AND TREATMENT PLANNING NOTE    ICD-9-CM ICD-10-CM  1. Brain metastases 198.3 C79.31   DIAGNOSIS:  58 year old woman with 3 isolated recurrent brain metastases status post previous whole brain irradiation from primary small cell carcinoma lung cancer  NARRATIVE:  The patient was brought to the [facility].  Identity was confirmed.  All relevant records and images related to the planned course of therapy were reviewed.    We reviewed her recent targeting MRI, which showed two additional punctate lesions.  It is not clear whether these may have previously been there on her lower resolution scans, representing stable findings, or if they are in fact new.  In light of that, we felt comfortable planning to treat the 3 new lesions and follow for others on subsequent MRI  The patient freely provided informed written consent to proceed with treatment after reviewing the details related to the planned course of therapy. The consent form was witnessed and verified by the simulation staff. Intravenous access was established for contrast administration. Then, the patient was set-up in a stable reproducible supine position for radiation therapy.  A relocatable thermoplastic stereotactic head frame was fabricated for precise immobilization.  CT images were obtained.  Surface markings were placed.  The CT images were loaded into the planning software and fused with the patient's targeting MRI scan.  Then the target and avoidance structures were contoured.  Treatment planning then occurred.  The radiation prescription was entered and confirmed.  I have requested 3D planning  I have requested a DVH of the following structures: Brain stem, brain, left eye, right eye, lenses, optic chiasm, target volumes, uninvolved brain, and normal tissue.      PLAN:  The patient will receive 20 Gy in one fraction.  ________________________________  [name] Tammi Klippel, M.D.

## 2014-06-19 NOTE — Telephone Encounter (Signed)
error 

## 2014-06-19 NOTE — Progress Notes (Signed)
Patient has a power port. Port is not being flushed routinely. Port flushed today. Left message for Dr. Worthy Flank nurse to please reach out to patient and arrange routine flush appointments.

## 2014-06-20 ENCOUNTER — Telehealth: Payer: Self-pay | Admitting: Internal Medicine

## 2014-06-20 DIAGNOSIS — Z510 Encounter for antineoplastic radiation therapy: Secondary | ICD-10-CM | POA: Diagnosis not present

## 2014-06-20 NOTE — Telephone Encounter (Signed)
, °

## 2014-06-21 DIAGNOSIS — Z510 Encounter for antineoplastic radiation therapy: Secondary | ICD-10-CM | POA: Diagnosis not present

## 2014-06-21 LAB — CREATININE, SERUM
Creatinine, Ser: 0.67 mg/dL (ref 0.50–1.10)
GFR calc non Af Amer: >90 mL/min (ref >90)

## 2014-06-21 LAB — BUN: BUN: 16 mg/dL (ref 6–23)

## 2014-06-23 ENCOUNTER — Telehealth: Payer: Self-pay | Admitting: Radiation Oncology

## 2014-06-23 NOTE — Telephone Encounter (Signed)
Phoned patient's home, cell, and work number. No answer. Left messages requesting return call. Patient's BUN and creatinine drawn Saturday, 06/21/2014 are WDL. Calling to inform patient she is "OK" to resume Junamet.

## 2014-06-24 ENCOUNTER — Encounter: Payer: Self-pay | Admitting: Radiation Oncology

## 2014-06-24 DIAGNOSIS — Z510 Encounter for antineoplastic radiation therapy: Secondary | ICD-10-CM | POA: Diagnosis not present

## 2014-06-24 NOTE — Progress Notes (Signed)
10.27.15:  10.15.15 progress note faxed to [facility] - ok'd by Dr. Tammi Klippel.  Faxed to Christel Mormon, RN Case Manager [phone]

## 2014-06-27 ENCOUNTER — Encounter: Payer: Self-pay | Admitting: Radiation Oncology

## 2014-06-27 ENCOUNTER — Ambulatory Visit
Admission: RE | Admit: 2014-06-27 | Discharge: 2014-06-27 | Disposition: A | Discharge status: Home / Self Care | Payer: 59 | Source: Ambulatory Visit | Attending: Radiation Oncology | Admitting: Radiation Oncology

## 2014-06-27 VITALS — BP 120/84 | HR 91 | Temp 97.9°F | Resp 16

## 2014-06-27 DIAGNOSIS — C7931 Secondary malignant neoplasm of brain: Secondary | ICD-10-CM

## 2014-06-27 DIAGNOSIS — Z510 Encounter for antineoplastic radiation therapy: Secondary | ICD-10-CM | POA: Diagnosis not present

## 2014-06-27 NOTE — Op Note (Signed)
  Name: Carol Bond  MRN: [id number]  Date: 06/27/2014   DOB: 12-13-1955  Stereotactic Radiosurgery Operative Note  PRE-OPERATIVE DIAGNOSIS:  Multiple Brain Metastases  POST-OPERATIVE DIAGNOSIS:  Multiple Brain Metastases  PROCEDURE:  Stereotactic Radiosurgery  SURGEON:  Eustace Moore, MD  NARRATIVE: The patient underwent a radiation treatment planning session in the radiation oncology simulation suite under the care of the radiation oncology physician and physicist.  I participated closely in the radiation treatment planning afterwards. The patient underwent planning CT which was fused to 3T high resolution MRI with 1 mm axial slices.  These images were fused on the planning system.  We contoured the gross target volumes and subsequently expanded this to yield the Planning Target Volume. I actively participated in the planning process.  I helped to define and review the target contours and also the contours of the optic pathway, eyes, brainstem and selected nearby organs at risk.  All the dose constraints for critical structures were reviewed and compared to AAPM Task Group 101.  The prescription dose conformity was reviewed.  I approved the plan electronically.    Accordingly, Carol Bond was brought to the TrueBeam stereotactic radiation treatment linac and placed in the custom immobilization mask.  The patient was aligned according to the IR fiducial markers with BrainLab Exactrac, then orthogonal x-rays were used in ExacTrac with the 6DOF robotic table and the shifts were made to align the patient  Carol Bond received stereotactic radiosurgery uneventfully.    Lesions treated:  3   Complex lesions treated:  0 (>3.5 cm, <47mm of optic path, or within the brainstem)   The detailed description of the procedure is recorded in the radiation oncology procedure note.  I was present for the duration of the procedure.  DISPOSITION:  Following delivery, the patient was transported to  nursing in stable condition and monitored for possible acute effects to be discharged to home in stable condition with follow-up in one month.  Eustace Moore, MD 06/27/2014 1:17 PM

## 2014-06-27 NOTE — Progress Notes (Signed)
  Radiation Oncology         (336) [phone] ________________________________  Stereotactic Treatment Procedure Note  Name: Carol Bond MRN: [id number]  Date: 06/27/2014  DOB: July 02, 1956    SPECIAL TREATMENT PROCEDURE    ICD-9-CM ICD-10-CM  1. Brain metastases 198.3 C79.31    3D TREATMENT PLANNING AND DOSIMETRY:  The patient's radiation plan was reviewed and approved by neurosurgery and radiation oncology prior to treatment.  It showed 3-dimensional radiation distributions overlaid onto the planning CT/MRI image set.  [facility] for the target structures as well as the organs at risk were reviewed. The documentation of the 3D plan and dosimetry are filed in the radiation oncology EMR.  NARRATIVE:  Carol Bond was brought to the TrueBeam stereotactic radiation treatment machine and placed supine on the CT couch. The head frame was applied, and the patient was set up for stereotactic radiosurgery.  Neurosurgery was present for the set-up and delivery  SIMULATION VERIFICATION:  In the couch zero-angle position, the patient underwent Exactrac imaging using the Brainlab system with orthogonal KV images.  These were carefully aligned and repeated to confirm treatment position for each of the isocenters.  The Exactrac snap film verification was repeated at each couch angle.  SPECIAL TREATMENT PROCEDURE: Carol Bond received stereotactic radiosurgery to the following targets: Left parietal 20 mm target was treated using 5 Dynamic Conformal Arcs to a prescription dose of 20 Gy.  ExacTrac registration was performed for each couch angle.  The 79.4% isodose line was prescribed. Right Parietal 6 mm target was treated using 3 Dynamic Conformal Arcs to a prescription dose of 20 Gy.  ExacTrac registration was performed for each couch angle.  The 78.7% isodose line was prescribed. Right Temporal 6 mm target was treated using 3 Dynamic Conformal Arcs to a prescription dose of 20 Gy.  ExacTrac  registration was performed for each couch angle.  The 81.3% isodose line was prescribed.  STEREOTACTIC TREATMENT MANAGEMENT:  Following delivery, the patient was transported to nursing in stable condition and monitored for possible acute effects.  Vital signs were recorded BP 120/84  Pulse 91  Temp(Src) 97.9 F (36.6 C) (Oral)  Resp 16  SpO2 100%. The patient tolerated treatment without significant acute effects, and was discharged to home in stable condition.    PLAN: Follow-up in one month.  ________________________________  [name]. Tammi Klippel, M.D.

## 2014-06-27 NOTE — Progress Notes (Signed)
Reports headache is better. Denies nausea, vomiting, diplopia, ringing in the ears or dizziness. Understands to call with needs. Provided follow up appointment card. Ambulated out with Manuela Schwartz.

## 2014-06-29 NOTE — Progress Notes (Signed)
  Radiation Oncology         (336) [phone] ________________________________  Name: Carol Bond MRN: [id number]  Date: 06/27/2014  DOB: 23-Mar-1956  End of Treatment Note    ICD-9-CM ICD-10-CM  1. Brain metastases 198.3 C79.31   Diagnosis: 58 year old woman with 3 isolated recurrent brain metastases status post previous whole brain irradiation from primary small cell carcinoma lung cancer  Indication for treatment:  Palliation       Radiation treatment dates:   06/27/2014  Site/dose:   Carol Bond received stereotactic radiosurgery to the following targets:  Left parietal 20 mm target was treated using 5 Dynamic Conformal Arcs to a prescription dose of 20 Gy. ExacTrac registration was performed for each couch angle. The 79.4% isodose line was prescribed.  Right Parietal 6 mm target was treated using 3 Dynamic Conformal Arcs to a prescription dose of 20 Gy. ExacTrac registration was performed for each couch angle. The 78.7% isodose line was prescribed.  Right Temporal 6 mm target was treated using 3 Dynamic Conformal Arcs to a prescription dose of 20 Gy. ExacTrac registration was performed for each couch angle. The 81.3% isodose line was prescribed.  Beams/energy:   6 MV X-rays were delivered in the flattening filter free mode.  Narrative: The patient tolerated radiation treatment relatively well.     Plan: The patient has completed radiation treatment. The patient will return to radiation oncology clinic for routine followup in one month. I advised them to call or return sooner if they have any questions or concerns related to their recovery or treatment. ________________________________  [name]. Tammi Klippel, M.D.

## 2014-07-07 ENCOUNTER — Other Ambulatory Visit: Payer: 59

## 2014-07-09 ENCOUNTER — Encounter (HOSPITAL_COMMUNITY): Payer: Self-pay

## 2014-07-09 ENCOUNTER — Other Ambulatory Visit (HOSPITAL_BASED_OUTPATIENT_CLINIC_OR_DEPARTMENT_OTHER): Payer: 59

## 2014-07-09 ENCOUNTER — Ambulatory Visit (HOSPITAL_COMMUNITY)
Admission: RE | Admit: 2014-07-09 | Discharge: 2014-07-09 | Disposition: A | Discharge status: Home / Self Care | Payer: 59 | Source: Ambulatory Visit | Attending: Internal Medicine | Admitting: Internal Medicine

## 2014-07-09 ENCOUNTER — Other Ambulatory Visit: Payer: Self-pay | Admitting: Internal Medicine

## 2014-07-09 DIAGNOSIS — J929 Pleural plaque without asbestos: Secondary | ICD-10-CM | POA: Diagnosis not present

## 2014-07-09 DIAGNOSIS — C7951 Secondary malignant neoplasm of bone: Secondary | ICD-10-CM | POA: Diagnosis not present

## 2014-07-09 DIAGNOSIS — C3490 Malignant neoplasm of unspecified part of unspecified bronchus or lung: Secondary | ICD-10-CM

## 2014-07-09 DIAGNOSIS — C7972 Secondary malignant neoplasm of left adrenal gland: Secondary | ICD-10-CM | POA: Diagnosis not present

## 2014-07-09 DIAGNOSIS — J181 Lobar pneumonia, unspecified organism: Secondary | ICD-10-CM | POA: Diagnosis not present

## 2014-07-09 DIAGNOSIS — C7931 Secondary malignant neoplasm of brain: Secondary | ICD-10-CM | POA: Diagnosis not present

## 2014-07-09 DIAGNOSIS — K689 Other disorders of retroperitoneum: Secondary | ICD-10-CM | POA: Diagnosis not present

## 2014-07-09 DIAGNOSIS — K769 Liver disease, unspecified: Secondary | ICD-10-CM | POA: Diagnosis not present

## 2014-07-09 DIAGNOSIS — C3412 Malignant neoplasm of upper lobe, left bronchus or lung: Secondary | ICD-10-CM

## 2014-07-09 LAB — CBC WITH DIFFERENTIAL/PLATELET
BASO%: 0.8 % (ref 0.0–2.0)
Basophils Absolute: 0 10*3/uL (ref 0.0–0.1)
EOS ABS: 0 10*3/uL (ref 0.0–0.5)
EOS%: 1.6 % (ref 0.0–7.0)
HCT: 37.3 % (ref 34.8–46.6)
HGB: 12 g/dL (ref 11.6–15.9)
LYMPH#: 0.7 10*3/uL — AB (ref 0.9–3.3)
LYMPH%: 26.5 % (ref 14.0–49.7)
MCH: 29.7 pg (ref 25.1–34.0)
MCHC: 32.2 g/dL (ref 31.5–36.0)
MCV: 92.3 fL (ref 79.5–101.0)
MONO#: 0.3 10*3/uL (ref 0.1–0.9)
MONO%: 10.9 % (ref 0.0–14.0)
NEUT%: 60.2 % (ref 38.4–76.8)
NEUTROS ABS: 1.6 10*3/uL (ref 1.5–6.5)
PLATELETS: 214 10*3/uL (ref 145–400)
RBC: 4.04 10*6/uL (ref 3.70–5.45)
RDW: 15.1 % — ABNORMAL HIGH (ref 11.2–14.5)
WBC: 2.7 10*3/uL — AB (ref 3.9–10.3)

## 2014-07-09 LAB — COMPREHENSIVE METABOLIC PANEL (CC13)
ALT: 37 U/L (ref 0–55)
ANION GAP: 7 meq/L (ref 3–11)
AST: 21 U/L (ref 5–34)
Albumin: 4.1 g/dL (ref 3.5–5.0)
Alkaline Phosphatase: 57 U/L (ref 40–150)
BUN: 16.3 mg/dL (ref 7.0–26.0)
CALCIUM: 10.4 mg/dL (ref 8.4–10.4)
CHLORIDE: 104 meq/L (ref 98–109)
CO2: 30 meq/L — AB (ref 22–29)
Creatinine: 0.8 mg/dL (ref 0.6–1.1)
GLUCOSE: 134 mg/dL (ref 70–140)
Potassium: 4.3 mEq/L (ref 3.5–5.1)
Sodium: 141 mEq/L (ref 136–145)
TOTAL PROTEIN: 7.4 g/dL (ref 6.4–8.3)
Total Bilirubin: 0.22 mg/dL (ref 0.20–1.20)

## 2014-07-09 MED ORDER — IOHEXOL 300 MG/ML  SOLN
100.0000 mL | Freq: Once | INTRAMUSCULAR | Status: AC | PRN
  Administered 2014-07-09: 100 mL via INTRAVENOUS

## 2014-07-11 ENCOUNTER — Emergency Department (HOSPITAL_COMMUNITY): Payer: 59

## 2014-07-11 ENCOUNTER — Encounter (HOSPITAL_COMMUNITY): Payer: Self-pay | Admitting: Emergency Medicine

## 2014-07-11 ENCOUNTER — Other Ambulatory Visit: Payer: Self-pay

## 2014-07-11 ENCOUNTER — Emergency Department (HOSPITAL_COMMUNITY)
Admission: EM | Admit: 2014-07-11 | Discharge: 2014-07-11 | Disposition: A | Discharge status: Home / Self Care | Payer: 59 | Attending: Emergency Medicine | Admitting: Emergency Medicine

## 2014-07-11 DIAGNOSIS — Z794 Long term (current) use of insulin: Secondary | ICD-10-CM | POA: Insufficient documentation

## 2014-07-11 DIAGNOSIS — Z85841 Personal history of malignant neoplasm of brain: Secondary | ICD-10-CM | POA: Diagnosis not present

## 2014-07-11 DIAGNOSIS — Z79899 Other long term (current) drug therapy: Secondary | ICD-10-CM | POA: Insufficient documentation

## 2014-07-11 DIAGNOSIS — Z923 Personal history of irradiation: Secondary | ICD-10-CM | POA: Insufficient documentation

## 2014-07-11 DIAGNOSIS — Z85118 Personal history of other malignant neoplasm of bronchus and lung: Secondary | ICD-10-CM | POA: Insufficient documentation

## 2014-07-11 DIAGNOSIS — R000 Tachycardia, unspecified: Secondary | ICD-10-CM | POA: Diagnosis not present

## 2014-07-11 DIAGNOSIS — R0602 Shortness of breath: Secondary | ICD-10-CM | POA: Diagnosis not present

## 2014-07-11 DIAGNOSIS — I313 Pericardial effusion (noninflammatory): Secondary | ICD-10-CM | POA: Diagnosis not present

## 2014-07-11 DIAGNOSIS — E119 Type 2 diabetes mellitus without complications: Secondary | ICD-10-CM | POA: Diagnosis not present

## 2014-07-11 DIAGNOSIS — Z87891 Personal history of nicotine dependence: Secondary | ICD-10-CM | POA: Diagnosis not present

## 2014-07-11 DIAGNOSIS — Z86711 Personal history of pulmonary embolism: Secondary | ICD-10-CM | POA: Insufficient documentation

## 2014-07-11 DIAGNOSIS — I3139 Other pericardial effusion (noninflammatory): Secondary | ICD-10-CM

## 2014-07-11 DIAGNOSIS — E039 Hypothyroidism, unspecified: Secondary | ICD-10-CM | POA: Diagnosis not present

## 2014-07-11 DIAGNOSIS — R079 Chest pain, unspecified: Secondary | ICD-10-CM

## 2014-07-11 LAB — CBC
HCT: 36.4 % (ref 36.0–46.0)
HEMOGLOBIN: 11.8 g/dL — AB (ref 12.0–15.0)
MCH: 30 pg (ref 26.0–34.0)
MCHC: 32.4 g/dL (ref 30.0–36.0)
MCV: 92.6 fL (ref 78.0–100.0)
Platelets: 159 10*3/uL (ref 150–400)
RBC: 3.93 MIL/uL (ref 3.87–5.11)
RDW: 13.8 % (ref 11.5–15.5)
WBC: 3.4 10*3/uL — ABNORMAL LOW (ref 4.0–10.5)

## 2014-07-11 LAB — BASIC METABOLIC PANEL
Anion gap: 16 — ABNORMAL HIGH (ref 5–15)
BUN: 15 mg/dL (ref 6–23)
CO2: 24 meq/L (ref 19–32)
Calcium: 10.3 mg/dL (ref 8.4–10.5)
Chloride: 97 mEq/L (ref 96–112)
Creatinine, Ser: 0.6 mg/dL (ref 0.50–1.10)
GFR calc Af Amer: >90 mL/min (ref >90)
GFR calc non Af Amer: >90 mL/min (ref >90)
GLUCOSE: 137 mg/dL — AB (ref 70–99)
Potassium: 4.1 mEq/L (ref 3.7–5.3)
SODIUM: 137 meq/L (ref 137–147)

## 2014-07-11 LAB — I-STAT TROPONIN, ED
TROPONIN I, POC: 0 ng/mL (ref 0.00–0.08)
TROPONIN I, POC: 0 ng/mL (ref 0.00–0.08)

## 2014-07-11 LAB — PRO B NATRIURETIC PEPTIDE: PRO B NATRI PEPTIDE: 40.6 pg/mL (ref 0–125)

## 2014-07-11 MED ORDER — MORPHINE SULFATE 4 MG/ML IJ SOLN
4.0000 mg | Freq: Once | INTRAMUSCULAR | Status: AC
  Administered 2014-07-11: 4 mg via INTRAVENOUS
  Filled 2014-07-11: qty 1

## 2014-07-11 MED ORDER — IOHEXOL 300 MG/ML  SOLN
100.0000 mL | Freq: Once | INTRAMUSCULAR | Status: AC | PRN
  Administered 2014-07-11: 100 mL via INTRAVENOUS

## 2014-07-11 MED ORDER — OXYCODONE-ACETAMINOPHEN 5-325 MG PO TABS
1.0000 | ORAL_TABLET | Freq: Once | ORAL | Status: AC
  Administered 2014-07-11: 1 via ORAL
  Filled 2014-07-11: qty 1

## 2014-07-11 MED ORDER — ASPIRIN 81 MG PO CHEW
CHEWABLE_TABLET | ORAL | Status: AC
  Administered 2014-07-11: 324 mg
  Filled 2014-07-11: qty 4

## 2014-07-11 MED ORDER — OXYCODONE-ACETAMINOPHEN 5-325 MG PO TABS: 1.0000 | ORAL_TABLET | ORAL | Status: DC | PRN

## 2014-07-11 MED ORDER — ASPIRIN 325 MG PO TABS: 325.0000 mg | ORAL_TABLET | ORAL | Status: AC

## 2014-07-11 NOTE — ED Provider Notes (Signed)
CSN: [id number]     Arrival date & time 07/11/14  0809 History   First MD Initiated Contact with Patient 07/11/14 [phone]     Chief Complaint  Patient presents with  . Chest Pain     (Consider location/radiation/quality/duration/timing/severity/associated sxs/prior Treatment) HPI Comments: 58 year old female with history of lung cancer, radiation, pulmonary embolism not on blood thinners, brain metastasis presents with intermittent left-sided chest pain with mild radiation to the neck. Mild pleuritic component but overall comes on randomly, no exertional symptoms. Possibly similar to her blood clot in the past.patient has minimal shortness of breath, mild left upper back pain, aspirin given. No recent surgeries or hemoptysis. Patient says her lung cancer size has improved significantly, patient has stage IV. Patient follows locally with oncology. Symptoms last a few minutes at a time and then resolve, multiple episodes the past 3 days.  Patient is a 58 y.o. female presenting with chest pain. The history is provided by the patient.  Chest Pain Associated symptoms: cough (chronic) and shortness of breath   Associated symptoms: no abdominal pain, no back pain, no fever, no headache and not vomiting     Past Medical History  Diagnosis Date  . Pulmonary embolism   . Hypothyroid   . Radiation 12/18/13-12/31/13    whole brain 30 gray, left central chest 30 gray  . 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 Brain cancer   . Diabetes     metformin   Past Surgical History  Procedure Laterality Date  . Abdominal hysterectomy    . Spine surgery    . Cervical fusion    . Video bronchoscopy with endobronchial ultrasound N/A 12/13/2013    Procedure: VIDEO BRONCHOSCOPY WITH ENDOBRONCHIAL ULTRASOUND;  Surgeon: Grace Isaac, MD;  Location: [facility];  Service: Thoracic;  Laterality: N/A;  . Portacath placement Left 12/13/2013    Procedure: INSERTION PORT-A-CATH;  Surgeon:  Grace Isaac, MD;  Location: [facility] OR;  Service: Thoracic;  Laterality: Left;   Family History  Problem Relation Age of Onset  . Colon cancer Mother   . Liver cancer Father    History  Substance Use Topics  . Smoking status: Former Smoker -- 0.50 packs/day for 25 years    Types: Cigarettes  . Smokeless tobacco: Never Used  . Alcohol Use: No   OB History    No data available     Review of Systems  Constitutional: Negative for fever and chills.  HENT: Negative for congestion.   Eyes: Negative for visual disturbance.  Respiratory: Positive for cough (chronic) and shortness of breath.   Cardiovascular: Positive for chest pain. Negative for leg swelling.  Gastrointestinal: Negative for vomiting and abdominal pain.  Genitourinary: Negative for dysuria and flank pain.  Musculoskeletal: Negative for back pain, neck pain and neck stiffness.  Skin: Negative for rash.  Neurological: Negative for light-headedness and headaches.      Allergies  Actos and Vicodin  Home Medications   Prior to Admission medications   Medication Sig Start Date End Date Taking? Authorizing Provider  albuterol (PROVENTIL HFA;VENTOLIN HFA) 108 (90 BASE) MCG/ACT inhaler Inhale 2 puffs into the lungs every 6 (six) hours as needed for wheezing or shortness of breath. 12/20/13  Yes Costin Karlyne Greenspan, MD  Canagliflozin (INVOKANA) 300 MG TABS Take 1 tablet by mouth daily before breakfast.   Yes Historical Provider, MD  diphenhydrAMINE (SOMINEX) 25 MG tablet Take 25 mg by mouth every 6 (  six) hours as needed for allergies or sleep.    Yes Historical Provider, MD  escitalopram (LEXAPRO) 10 MG tablet Take 10 mg by mouth at bedtime.    Yes Historical Provider, MD  esomeprazole (NEXIUM) 20 MG capsule Take 20 mg by mouth daily as needed (for acid reflex).   Yes Historical Provider, MD  fenofibrate 160 MG tablet Take 160 mg by mouth at bedtime.    Yes Historical Provider, MD  furosemide (LASIX) 20 MG tablet Take 1 tablet  (20 mg total) by mouth daily as needed. 05/07/14  Yes Curt Bears, MD  glimepiride (AMARYL) 4 MG tablet Take 4 mg by mouth daily with breakfast.   Yes Historical Provider, MD  levothyroxine (SYNTHROID, LEVOTHROID) 150 MCG tablet Take 150 mcg by mouth daily before breakfast.   Yes Historical Provider, MD  lidocaine-prilocaine (EMLA) cream Apply 1 application topically as needed. Apply to port a cath site 90 minutes prior to chemotherapy appointment. 01/01/14  Yes Curt Bears, MD  LORazepam (ATIVAN) 1 MG tablet Take 0.5-1 mg by mouth 2 (two) times daily.  03/12/14  Yes Historical Provider, MD  losartan (COZAAR) 50 MG tablet Take 50 mg by mouth at bedtime.    Yes Historical Provider, MD  naproxen sodium (ANAPROX) 220 MG tablet Take 660 mg by mouth 2 (two) times daily with a meal.   Yes Historical Provider, MD  sitaGLIPtin-metformin (JANUMET) 50-1000 MG per tablet Take 1 tablet by mouth 2 (two) times daily with a meal.   Yes Historical Provider, MD  vitamin B-12 (CYANOCOBALAMIN) 100 MCG tablet Take 100 mcg by mouth daily.   Yes Historical Provider, MD  atorvastatin (LIPITOR) 40 MG tablet Take 40 mg by mouth daily.    Historical Provider, MD  insulin glargine (LANTUS) 100 unit/mL SOPN Inject 0.05 mLs (5 Units total) into the skin at bedtime. Solostar Pen 12/20/13   Costin Karlyne Greenspan, MD   BP 100/56 mmHg  Pulse 84  Temp(Src) 97.9 F (36.6 C) (Oral)  Resp 18  SpO2 98% Physical Exam  Constitutional: She is oriented to person, place, and time. She appears well-developed and well-nourished.  HENT:  Head: Normocephalic and atraumatic.  Eyes: Conjunctivae are normal. Right eye exhibits no discharge. Left eye exhibits no discharge.  Neck: Normal range of motion. Neck supple. No tracheal deviation present.  Cardiovascular: Regular rhythm.  Tachycardia present.   Pulmonary/Chest: Effort normal and breath sounds normal.  Abdominal: Soft. She exhibits no distension. There is no tenderness. There is no  guarding.  Musculoskeletal: She exhibits no edema.  Neurological: She is alert and oriented to person, place, and time.  Skin: Skin is warm. No rash noted.  Psychiatric: She has a normal mood and affect.  Nursing note and vitals reviewed.   ED Course  Procedures (including critical care time)   EMERGENCY DEPARTMENT [place] CARDIAC EXAM "Study: Limited Ultrasound of the heart and pericardium"  INDICATIONS:Tachycardia and Dyspnea Multiple views of the heart and pericardium were obtained in real-time with a multi-frequency probe.  PERFORMED VO:HYWVPX  IMAGES ARCHIVED?: Yes  FINDINGS: Small effusion  LIMITATIONS:  Body habitus  VIEWS USED: Subcostal 4 chamber, Parasternal long axis, Parasternal short axis, Apical 4 chamber  and Inferior Vena Cava  INTERPRETATION: Cardiac activity present, Pericardial effusion present, Cardiac tamponade absent, Probable elevated CVP and Normal contractility   Labs Review Labs Reviewed  CBC - Abnormal; Notable for the following:    WBC 3.4 (*)    Hemoglobin 11.8 (*)    All other components  within normal limits  BASIC METABOLIC PANEL - Abnormal; Notable for the following:    Glucose, Bld 137 (*)    Anion gap 16 (*)    All other components within normal limits  PRO B NATRIURETIC PEPTIDE  I-STAT TROPOININ, ED  I-STAT TROPOININ, ED    Imaging Review Ct Angio Chest Pe W/cm &/or Wo Cm  07/11/2014   CLINICAL DATA:  Chest pain since this morning. Patient does have a history of lung cancer.  EXAM: CT ANGIOGRAPHY CHEST WITH CONTRAST  TECHNIQUE: Multidetector CT imaging of the chest was performed using the standard protocol during bolus administration of intravenous contrast. Multiplanar CT image reconstructions and MIPs were obtained to evaluate the vascular anatomy.  CONTRAST:  140mL OMNIPAQUE IOHEXOL 300 MG/ML  SOLN  COMPARISON:  Multiple prior chest CTs. The most recent is 07/09/2014  FINDINGS: Chest wall: No supraclavicular or axillary mass or  adenopathy. The left-sided Port-A-Cath is stable. No obvious breast masses. The bony thorax is intact. No destructive bone lesions or spinal canal compromise.  Mediastinum: The heart is normal in size and stable. There is a small pericardial effusion. Stable epicardial masses. Stable left-sided mediastinal tumor. Stable borderline enlarged mediastinal lymph nodes. The aorta is normal in caliber. No dissection. Stable coronary artery calcifications. The pulmonary arterial tree is fairly well opacified. No filling defects to suggest pulmonary emboli.  Lungs: Stable radiation changes involving the left upper lobe. No new/acute pulmonary findings. Small left pleural effusion. No metastatic pulmonary nodules.  Upper abdomen: Stable left adrenal gland metastasis. No liver lesions.  Review of the MIP images confirms the above findings.  IMPRESSION: No CT findings for pulmonary embolism.  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  No new/acute pulmonary findings.   Electronically Signed   By: Kalman Jewels M.D.   On: 07/11/2014 10:37   Dg Chest Port 1 View  07/11/2014   CLINICAL DATA:  Left-sided chest pain, shortness of breath and history of lung carcinoma.  EXAM: PORTABLE CHEST - 1 VIEW  COMPARISON:  CT of the chest on 07/09/2014  FINDINGS: Stable positioning of Port-A-Cath with the tip in the SVC. Density at the left lung base is associated with elevation of the left hemidiaphragm. This may represent atelectasis versus infiltrate. No pulmonary edema, pneumothorax or pleural fluid identified. The heart size and mediastinal contours are normal.  IMPRESSION: Left lower lung atelectasis versus infiltrate with volume loss.   Electronically Signed   By: Aletta Edouard M.D.   On: 07/11/2014 08:54     EKG Interpretation None      EKG reviewed heart rate 105, sinus tachycardia, no acute ST elevation, similar to previous, mild prolonged QT MDM   Final diagnoses:  Chest pain  Pericardial effusion    Patient with intermittent brief left-sided chest pain, history of blood clots, low risk cardiac. Bedside ultrasound performed with history of cancer and chest pain, small pericardial effusion noted. Daughter looked up last echo and patient had a small effusion at that times well. Plan for blood work, cardiac screen and CT angina the chest to look for pulmonary embolism, patient has a moderate to high pretest probability.  EKG reviewed similar to previous, subtle elevation leads 3 and aVF, flattening lateral, Q waves V1 V2, heart rate 105 sinus tachycardia, mild prolonged QT corrected  Patient's symptoms improved in the ER. CT angiogram no no acute findings, no blood clot reviewed results. Patient's vitals stable in the ER. Discussed observation in the hospital for echo  and serial troponins versus close follow-up outpatient. Patient family prefer close follow-up outpatient she has an appointment Monday, patient improved. Discussed differential diagnosis for intermittent chest pain possibly related to cancer in that left side. Delta troponin negative.  Results and differential diagnosis were discussed with the patient/parent/guardian. Close follow up outpatient was discussed, comfortable with the plan.   Medications  aspirin tablet 325 mg (0 mg Oral Duplicate 85/92/92 4462)  aspirin 81 MG chewable tablet (324 mg  Given 07/11/14 0834)  iohexol (OMNIPAQUE) 300 MG/ML solution 100 mL (100 mLs Intravenous Contrast Given 07/11/14 0958)  morphine 4 MG/ML injection 4 mg (4 mg Intravenous Given 07/11/14 1029)  oxyCODONE-acetaminophen (PERCOCET/ROXICET) 5-325 MG per tablet 1 tablet (1 tablet Oral Given 07/11/14 1138)    Filed Vitals:   07/11/14 0820 07/11/14 0900 07/11/14 0930 07/11/14 1034  BP: 101/71 104/68 112/74 100/56  Pulse: 107  87 84  Temp: 97.9 F (36.6 C)     TempSrc: Oral     Resp: 18     SpO2: 96%  95% 98%    Final diagnoses:  Chest pain  Pericardial effusion      .     Mariea Clonts, MD 07/11/14 [phone]

## 2014-07-11 NOTE — ED Notes (Signed)
Dr. Reather Converse in to see patient

## 2014-07-11 NOTE — ED Notes (Signed)
Pt states that she has intermittent left chest pain that started couple hours ago while she was sleeping. Pt states that she does have little shob, back pain and left arm pain.  Pt has lung, adrenal and brain cancer.

## 2014-07-11 NOTE — Discharge Instructions (Signed)
If you were given medicines take as directed.  If you are on coumadin or contraceptives realize their levels and effectiveness is altered by many different medicines.  If you have any reaction (rash, tongues swelling, other) to the medicines stop taking and see a physician.   Please follow up as directed and return to the ER or see a physician for new or worsening symptoms.  Thank you. Filed Vitals:   07/11/14 0820 07/11/14 0900 07/11/14 0930 07/11/14 1034  BP: 101/71 104/68 112/74 100/56  Pulse: 107  87 84  Temp: 97.9 F (36.6 C)     TempSrc: Oral     Resp: 18     SpO2: 96%  95% 98%    Chest Pain (Nonspecific) It is often hard to give a specific diagnosis for the cause of chest pain. There is always a chance that your pain could be related to something serious, such as a heart attack or a blood clot in the lungs. You need to follow up with your health care provider for further evaluation. CAUSES   Heartburn.  Pneumonia or bronchitis.  Anxiety or stress.  Inflammation around your heart (pericarditis) or lung (pleuritis or pleurisy).  A blood clot in the lung.  A collapsed lung (pneumothorax). It can develop suddenly on its own (spontaneous pneumothorax) or from trauma to the chest.  Shingles infection (herpes zoster virus). The chest wall is composed of bones, muscles, and cartilage. Any of these can be the source of the pain.  The bones can be bruised by injury.  The muscles or cartilage can be strained by coughing or overwork.  The cartilage can be affected by inflammation and become sore (costochondritis). DIAGNOSIS  Lab tests or other studies may be needed to find the cause of your pain. Your health care provider may have you take a test called an ambulatory electrocardiogram (ECG). An ECG records your heartbeat patterns over a 24-hour period. You may also have other tests, such as:  Transthoracic echocardiogram (TTE). During echocardiography, sound waves are used to  evaluate how blood flows through your heart.  Transesophageal echocardiogram (TEE).  Cardiac monitoring. This allows your health care provider to monitor your heart rate and rhythm in real time.  Holter monitor. This is a portable device that records your heartbeat and can help diagnose heart arrhythmias. It allows your health care provider to track your heart activity for several days, if needed.  Stress tests by exercise or by giving medicine that makes the heart beat faster. TREATMENT   Treatment depends on what may be causing your chest pain. Treatment may include:  Acid blockers for heartburn.  Anti-inflammatory medicine.  Pain medicine for inflammatory conditions.  Antibiotics if an infection is present.  You may be advised to change lifestyle habits. This includes stopping smoking and avoiding alcohol, caffeine, and chocolate.  You may be advised to keep your head raised (elevated) when sleeping. This reduces the chance of acid going backward from your stomach into your esophagus. Most of the time, nonspecific chest pain will improve within 2-3 days with rest and mild pain medicine.  HOME CARE INSTRUCTIONS   If antibiotics were prescribed, take them as directed. Finish them even if you start to feel better.  For the next few days, avoid physical activities that bring on chest pain. Continue physical activities as directed.  Do not use any tobacco products, including cigarettes, chewing tobacco, or electronic cigarettes.  Avoid drinking alcohol.  Only take medicine as directed by your health  care provider.  Follow your health care provider's suggestions for further testing if your chest pain does not go away.  Keep any follow-up appointments you made. If you do not go to an appointment, you could develop lasting (chronic) problems with pain. If there is any problem keeping an appointment, call to reschedule. SEEK MEDICAL CARE IF:   Your chest pain does not go away,  even after treatment.  You have a rash with blisters on your chest.  You have a fever. SEEK IMMEDIATE MEDICAL CARE IF:   You have increased chest pain or pain that spreads to your arm, neck, jaw, back, or abdomen.  You have shortness of breath.  You have an increasing cough, or you cough up blood.  You have severe back or abdominal pain.  You feel nauseous or vomit.  You have severe weakness.  You faint.  You have chills. This is an emergency. Do not wait to see if the pain will go away. Get medical help at once. Call your local emergency services (911 in [place] Do not drive yourself to the hospital. MAKE SURE YOU:   Understand these instructions.  Will watch your condition.  Will get help right away if you are not doing well or get worse. Document Released: 05/25/2005 Document Revised: 08/20/2013 Document Reviewed: 03/20/2008 [facility] Patient Information 2015 [place], [place]. This information is not intended to replace advice given to you by your health care provider. Make sure you discuss any questions you have with your health care provider.

## 2014-07-14 ENCOUNTER — Ambulatory Visit (HOSPITAL_BASED_OUTPATIENT_CLINIC_OR_DEPARTMENT_OTHER): Payer: 59 | Admitting: Internal Medicine

## 2014-07-14 ENCOUNTER — Encounter: Payer: Self-pay | Admitting: Internal Medicine

## 2014-07-14 ENCOUNTER — Telehealth: Payer: Self-pay | Admitting: Internal Medicine

## 2014-07-14 VITALS — BP 137/82 | HR 73 | Temp 98.4°F | Resp 18 | Ht 66.0 in | Wt 169.2 lb

## 2014-07-14 DIAGNOSIS — C3490 Malignant neoplasm of unspecified part of unspecified bronchus or lung: Secondary | ICD-10-CM | POA: Insufficient documentation

## 2014-07-14 DIAGNOSIS — C3412 Malignant neoplasm of upper lobe, left bronchus or lung: Secondary | ICD-10-CM

## 2014-07-14 DIAGNOSIS — C7931 Secondary malignant neoplasm of brain: Secondary | ICD-10-CM

## 2014-07-14 DIAGNOSIS — C3492 Malignant neoplasm of unspecified part of left bronchus or lung: Secondary | ICD-10-CM

## 2014-07-14 MED ORDER — DEXAMETHASONE 4 MG PO TABS: 4.0000 mg | ORAL_TABLET | Freq: Four times a day (QID) | ORAL | Status: DC

## 2014-07-14 MED ORDER — OXYCODONE-ACETAMINOPHEN 5-325 MG PO TABS: 1.0000 | ORAL_TABLET | ORAL | Status: DC | PRN

## 2014-07-14 NOTE — Progress Notes (Signed)
[place] [phone]   Fax:([phone]) Cole Camp, MD [address], [address] [place] [address]  DIAGNOSIS: Lung cancer  Primary site: Lung (Left)  Staging method: AJCC 7th Edition  Clinical free text: Extensive stage small cell lung cancer  Clinical: Stage IV (T3, N2, M1b) signed by Curt Bears, MD on 12/17/2013 7:45 PM  Summary: Stage IV (T3, N2, M1b)   PRIOR THERAPY:Whole brain irradiation under the care of Dr. Sondra Come   CURRENT THERAPY:  Systemic chemotherapy with carboplatin for an AUC of 5 given on day 1, etoposide at 120 mg per meter square given on days 1, 2 and 3 with Neulasta support given on day 4. Status post 6 cycles.  DISEASE STAGE: Lung cancer  Primary site: Lung (Left)  Staging method: AJCC 7th Edition  Clinical free text: Extensive stage small cell lung cancer  Clinical: Stage IV (T3, N2, M1b) signed by Curt Bears, MD on 12/17/2013 7:45 PM  Summary: Stage IV (T3, N2, M1b)  CHEMOTHERAPY INTENT: Palliative  CURRENT # OF CHEMOTHERAPY CYCLES: 6 CURRENT ANTIEMETICS: none  CURRENT SMOKING STATUS: Former smoker, quit July 2014  ORAL CHEMOTHERAPY AND CONSENT: n/a  CURRENT BISPHOSPHONATES USE: none  PAIN MANAGEMENT: oxycodone  NARCOTICS INDUCED CONSTIPATION: none  LIVING WILL AND CODE STATUS:    INTERVAL HISTORY: MALEEYAH MCCAUGHEY 58 y.o. female returns to the clinic today for followup visit accompanied by her husband and 2 daughters. She has been on observation for the last 2 months. She was recently found to have recurrent lesions in the brain and she underwent a stereotactic radiotherapy under the care of Dr. Tammi Klippel. She continues to complain of headache. She is not currently on steroids. She denied having any significant weight loss or night sweats. She has no fever or chills. She denied having any significant shortness of breath, cough or hemoptysis. She was seen recently at the  emergency Department complaining of persistent chest pain. She had CT angiogram of the chest performed at that time that showed no evidence for pulmonary embolism and showed stable disease compared to the prior exam a week before. She had repeat CT scan of the chest, abdomen and pelvis performed recently and she is here for evaluation and discussion of her scan results.  MEDICAL HISTORY: Past Medical History  Diagnosis Date  . Pulmonary embolism   . Hypothyroid   . Radiation 12/18/13-12/31/13    whole brain 30 gray, left central chest 30 gray  . 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 Brain cancer   . Diabetes     metformin    ALLERGIES:  is allergic to actos and vicodin.  MEDICATIONS:  Current Outpatient Prescriptions  Medication Sig Dispense Refill  . albuterol (PROVENTIL HFA;VENTOLIN HFA) 108 (90 BASE) MCG/ACT inhaler Inhale 2 puffs into the lungs every 6 (six) hours as needed for wheezing or shortness of breath. 1 Inhaler 2  . atorvastatin (LIPITOR) 40 MG tablet Take 40 mg by mouth daily.    . Canagliflozin (INVOKANA) 300 MG TABS Take 1 tablet by mouth daily before breakfast.    . diphenhydrAMINE (SOMINEX) 25 MG tablet Take 25 mg by mouth every 6 (six) hours as needed for allergies or sleep.     Marland Kitchen escitalopram (LEXAPRO) 10 MG tablet Take 10 mg by mouth at bedtime.     Marland Kitchen esomeprazole (NEXIUM) 20 MG capsule Take 20  mg by mouth daily as needed (for acid reflex).    . fenofibrate 160 MG tablet Take 160 mg by mouth at bedtime.     . furosemide (LASIX) 20 MG tablet Take 1 tablet (20 mg total) by mouth daily as needed. 15 tablet 0  . glimepiride (AMARYL) 4 MG tablet Take 4 mg by mouth daily with breakfast.    . insulin glargine (LANTUS) 100 unit/mL SOPN Inject 0.05 mLs (5 Units total) into the skin at bedtime. Solostar Pen 15 mL 11  . levothyroxine (SYNTHROID, LEVOTHROID) 150 MCG tablet Take 150 mcg by mouth daily before breakfast.    . lidocaine-prilocaine  (EMLA) cream Apply 1 application topically as needed. Apply to port a cath site 90 minutes prior to chemotherapy appointment. 30 g 0  . LORazepam (ATIVAN) 1 MG tablet Take 0.5-1 mg by mouth 2 (two) times daily.     Marland Kitchen losartan (COZAAR) 50 MG tablet Take 50 mg by mouth at bedtime.     . naproxen sodium (ANAPROX) 220 MG tablet Take 660 mg by mouth 2 (two) times daily with a meal.    . oxyCODONE-acetaminophen (PERCOCET) 5-325 MG per tablet Take 1-2 tablets by mouth every 4 (four) hours as needed. 10 tablet 0  . sitaGLIPtin-metformin (JANUMET) 50-1000 MG per tablet Take 1 tablet by mouth 2 (two) times daily with a meal.    . vitamin B-12 (CYANOCOBALAMIN) 100 MCG tablet Take 100 mcg by mouth daily.     No current facility-administered medications for this visit.    SURGICAL HISTORY:  Past Surgical History  Procedure Laterality Date  . Abdominal hysterectomy    . Spine surgery    . Cervical fusion    . Video bronchoscopy with endobronchial ultrasound N/A 12/13/2013    Procedure: VIDEO BRONCHOSCOPY WITH ENDOBRONCHIAL ULTRASOUND;  Surgeon: Grace Isaac, MD;  Location: [facility];  Service: Thoracic;  Laterality: N/A;  . Portacath placement Left 12/13/2013    Procedure: INSERTION PORT-A-CATH;  Surgeon: Grace Isaac, MD;  Location: [facility];  Service: Thoracic;  Laterality: Left;    REVIEW OF SYSTEMS:  Constitutional: positive for fatigue Eyes: negative Ears, nose, mouth, throat, and face: negative Respiratory: positive for dyspnea on exertion and pleurisy/chest pain Cardiovascular: negative Gastrointestinal: negative Genitourinary:negative Integument/breast: negative Hematologic/lymphatic: negative Musculoskeletal:negative Neurological: positive for headaches Behavioral/Psych: negative Endocrine: negative Allergic/Immunologic: negative   PHYSICAL EXAMINATION: General appearance: alert, cooperative, fatigued and no distress Head: Normocephalic, without obvious abnormality,  atraumatic Neck: no adenopathy, no JVD, supple, symmetrical, trachea midline and thyroid not enlarged, symmetric, no tenderness/mass/nodules Lymph nodes: Cervical, supraclavicular, and axillary nodes normal. Resp: clear to auscultation bilaterally Back: symmetric, no curvature. ROM normal. No CVA tenderness. Cardio: regular rate and rhythm, S1, S2 normal, no murmur, click, rub or gallop GI: soft, non-tender; bowel sounds normal; no masses,  no organomegaly Extremities: extremities normal, atraumatic, no cyanosis or edema Neurologic: Alert and oriented X 3, normal strength and tone. Normal symmetric reflexes. Normal coordination and gait  ECOG PERFORMANCE STATUS: 1 - Symptomatic but completely ambulatory  There were no vitals taken for this visit.  LABORATORY DATA: Lab Results  Component Value Date   WBC 3.4* 07/11/2014   HGB 11.8* 07/11/2014   HCT 36.4 07/11/2014   MCV 92.6 07/11/2014   PLT 159 07/11/2014      Chemistry      Component Value Date/Time   NA 137 07/11/2014 0840   NA 141 07/09/2014 1047   K 4.1 07/11/2014 0840   K 4.3 07/09/2014  1047   CL 97 07/11/2014 0840   CO2 24 07/11/2014 0840   CO2 30* 07/09/2014 1047   BUN 15 07/11/2014 0840   BUN 16.3 07/09/2014 1047   CREATININE 0.60 07/11/2014 0840   CREATININE 0.8 07/09/2014 1047      Component Value Date/Time   CALCIUM 10.3 07/11/2014 0840   CALCIUM 10.4 07/09/2014 1047   ALKPHOS 57 07/09/2014 1047   ALKPHOS 54 12/19/2013 0320   AST 21 07/09/2014 1047   AST 24 12/19/2013 0320   ALT 37 07/09/2014 1047   ALT 48* 12/19/2013 0320   BILITOT 0.22 07/09/2014 1047   BILITOT <0.2* 12/19/2013 0320       RADIOGRAPHIC STUDIES: Ct Chest W Contrast  07/09/2014   CLINICAL DATA:  Restaging lung carcinoma diagnosed April 2015. Bone metastasis previously diagnosed. Radiation therapy and chemotherapy complete. Additional history of brain metastasis.  EXAM: CT CHEST, ABDOMEN, AND PELVIS WITH CONTRAST  TECHNIQUE:  Multidetector CT imaging of the chest, abdomen and pelvis was performed following the standard protocol during bolus administration of intravenous contrast.  CONTRAST:  16mL OMNIPAQUE IOHEXOL 300 MG/ML  SOLN  COMPARISON:  CT 05/02/2014, PET-CT 01/01/2014  FINDINGS: CT CHEST FINDINGS  No axillary supraclavicular lymphadenopathy. There is a port in the medial left chest wall. No mediastinal lymphadenopathy. No pericardial fluid.  There is nodular thickening along the pericardial surface of the upper left heart. This nodularity is increased compared to prior with a discrete nodule measuring 14 mm x 12 mm on image 29, series 2 where previously there was ill-defined 5 mm thickening. There is a second focus of nodular thickening along the pericardial surface of the left lower lobe measuring 15 mm (image 35, series 2). No lesion was seen at this site on comparison exam  There is a consolidative pattern in the superior left lung on image 19, series 4 which is increased compared to prior. This may relate radiation change. There is small effusion in the posterior left hemi thorax superiorly which is increased compared prior. Evaluation of the right lung demonstrates no nodularity.  There is no central pulmonary embolism.  CT ABDOMEN AND PELVIS FINDINGS  Hepatobiliary: Hypervascular lesions in the lateral left hepatic lobe measuring 10 mm is not changed. No additional lesions.  Pancreas: Pancreas is normal. No duct dilatation. No pancreatic inflammation.  Spleen: Normal spleen  Adrenals/urinary tract: Nodule enlargement of the left adrenal gland to 2 cm is unchanged. Minimal enlargement of the medial limb of the right adrenal gland is also noted and not significantly changed. No enhancing renal lesion. No ureteral or bladder abnormality.  Stomach/Bowel: Stomach, small bowel, and colon are unremarkable. Diverticular disease of the colon.  Vascular/Lymphatic: Abdominal aorta is normal caliber. There is no retroperitoneal or  periportal lymphadenopathy. No pelvic lymphadenopathy.  Reproductive: Post hysterectomy.  No adnexal abnormality.  Musculoskeletal: No aggressive osseous lesion.  Other: Within the retroperitoneum adjacent to the left psoas muscle there is a new 6 mm nodule on image 94, series 2.  IMPRESSION: Chest Impression:  1. New nodular pleural thickening along the pericardial border is consistent with recurrence of pleural metastatic lung disease. 2. New consolidation in the superior left lung may represent radiation change but cannot exclude local recurrence.  Abdomen / Pelvis Impression:  1. New retroperitoneal nodule along the left psoas muscle is consistent with disease recurrence. This is site of prior metastasis on PET-CT scan. 2. Stable left adrenal metastasis 3. Stable hypervascular lesion within the left hepatic lobe is likely benign.  Electronically Signed   By: Suzy Bouchard M.D.   On: 07/09/2014 15:06   Ct Angio Chest Pe W/cm &/or Wo Cm  07/11/2014   CLINICAL DATA:  Chest pain since this morning. Patient does have a history of lung cancer.  EXAM: CT ANGIOGRAPHY CHEST WITH CONTRAST  TECHNIQUE: Multidetector CT imaging of the chest was performed using the standard protocol during bolus administration of intravenous contrast. Multiplanar CT image reconstructions and MIPs were obtained to evaluate the vascular anatomy.  CONTRAST:  146mL OMNIPAQUE IOHEXOL 300 MG/ML  SOLN  COMPARISON:  Multiple prior chest CTs. The most recent is 07/09/2014  FINDINGS: Chest wall: No supraclavicular or axillary mass or adenopathy. The left-sided Port-A-Cath is stable. No obvious breast masses. The bony thorax is intact. No destructive bone lesions or spinal canal compromise.  Mediastinum: The heart is normal in size and stable. There is a small pericardial effusion. Stable epicardial masses. Stable left-sided mediastinal tumor. Stable borderline enlarged mediastinal lymph nodes. The aorta is normal in caliber. No dissection.  Stable coronary artery calcifications. The pulmonary arterial tree is fairly well opacified. No filling defects to suggest pulmonary emboli.  Lungs: Stable radiation changes involving the left upper lobe. No new/acute pulmonary findings. Small left pleural effusion. No metastatic pulmonary nodules.  Upper abdomen: Stable left adrenal gland metastasis. No liver lesions.  Review of the MIP images confirms the above findings.  IMPRESSION: No CT findings for pulmonary embolism.  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  No new/acute pulmonary findings.   Electronically Signed   By: Kalman Jewels M.D.   On: 07/11/2014 10:37   Mr Jeri Cos DU Contrast  06/18/2014   CLINICAL DATA:  Metastatic lung cancer to the brain and adrenal glands. S RS restaging. Lung cancer previously located in the left lung.  EXAM: MRI HEAD WITHOUT AND WITH CONTRAST  TECHNIQUE: Multiplanar, multiecho pulse sequences of the brain and surrounding structures were obtained without and with intravenous contrast.  CONTRAST:  40mL MULTIHANCE GADOBENATE DIMEGLUMINE 529 MG/ML IV SOLN  COMPARISON:  06/10/2014.  02/10/2014.  12/17/2013.  FINDINGS: Since the study of 8 days ago, there has been some disease progression.  Diffuse brain T2 and FLAIR signal is again demonstrated consistent with previous radiation.  No residual or recurrent cerebellar disease is seen. On the axillae exams, there is punctate bright signal in the right cerebellum that is not confirmed on the coronal or sagittal imaging and therefore is probably artifactual. No brainstem lesion.  Within the right cerebral hemisphere, there is no change in the right temporal metastasis, image 66. 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 Punctate focus of enhancement in the right caudate head, image 87, is unchanged. There is a 3 x 4 mm metastasis along the surface of the brain in the left posterior frontal region, a image 113. There is a small cluster of metastasis  in the left posterior medial parietal lobe which has grown slightly since the last study. The largest component measures 6.8 mm as opposed to 6 mm previously. There is mild developing edema in that region. Small metastasis of the right posterior parietal vertex has enlarged, measuring 5.5 mm as opposed to 5 mm previously.  IMPRESSION: Stable foci of enhancement in the right temporal lobe and right caudate head.  Slight enlargement of metastatic lesions in the right posterior parietal vertex and the left posterior parietal vertex. Slight enlargement of a surface metastasis at the left posterior frontal region.   Electronically Signed  By: Nelson Chimes M.D.   On: 06/18/2014 10:55   Ct Abdomen Pelvis W Contrast  07/09/2014   CLINICAL DATA:  Restaging lung carcinoma diagnosed April 2015. Bone metastasis previously diagnosed. Radiation therapy and chemotherapy complete. Additional history of brain metastasis.  EXAM: CT CHEST, ABDOMEN, AND PELVIS WITH CONTRAST  TECHNIQUE: Multidetector CT imaging of the chest, abdomen and pelvis was performed following the standard protocol during bolus administration of intravenous contrast.  CONTRAST:  188mL OMNIPAQUE IOHEXOL 300 MG/ML  SOLN  COMPARISON:  CT 05/02/2014, PET-CT 01/01/2014  FINDINGS: CT CHEST FINDINGS  No axillary supraclavicular lymphadenopathy. There is a port in the medial left chest wall. No mediastinal lymphadenopathy. No pericardial fluid.  There is nodular thickening along the pericardial surface of the upper left heart. This nodularity is increased compared to prior with a discrete nodule measuring 14 mm x 12 mm on image 29, series 2 where previously there was ill-defined 5 mm thickening. There is a second focus of nodular thickening along the pericardial surface of the left lower lobe measuring 15 mm (image 35, series 2). No lesion was seen at this site on comparison exam  There is a consolidative pattern in the superior left lung on image 19, series 4 which  is increased compared to prior. This may relate radiation change. There is small effusion in the posterior left hemi thorax superiorly which is increased compared prior. Evaluation of the right lung demonstrates no nodularity.  There is no central pulmonary embolism.  CT ABDOMEN AND PELVIS FINDINGS  Hepatobiliary: Hypervascular lesions in the lateral left hepatic lobe measuring 10 mm is not changed. No additional lesions.  Pancreas: Pancreas is normal. No duct dilatation. No pancreatic inflammation.  Spleen: Normal spleen  Adrenals/urinary tract: Nodule enlargement of the left adrenal gland to 2 cm is unchanged. Minimal enlargement of the medial limb of the right adrenal gland is also noted and not significantly changed. No enhancing renal lesion. No ureteral or bladder abnormality.  Stomach/Bowel: Stomach, small bowel, and colon are unremarkable. Diverticular disease of the colon.  Vascular/Lymphatic: Abdominal aorta is normal caliber. There is no retroperitoneal or periportal lymphadenopathy. No pelvic lymphadenopathy.  Reproductive: Post hysterectomy.  No adnexal abnormality.  Musculoskeletal: No aggressive osseous lesion.  Other: Within the retroperitoneum adjacent to the left psoas muscle there is a new 6 mm nodule on image 94, series 2.  IMPRESSION: Chest Impression:  1. New nodular pleural thickening along the pericardial border is consistent with recurrence of pleural metastatic lung disease. 2. New consolidation in the superior left lung may represent radiation change but cannot exclude local recurrence.  Abdomen / Pelvis Impression:  1. New retroperitoneal nodule along the left psoas muscle is consistent with disease recurrence. This is site of prior metastasis on PET-CT scan. 2. Stable left adrenal metastasis 3. Stable hypervascular lesion within the left hepatic lobe is likely benign.   Electronically Signed   By: Suzy Bouchard M.D.   On: 07/09/2014 15:06   Dg Chest Port 1 View  07/11/2014    CLINICAL DATA:  Left-sided chest pain, shortness of breath and history of lung carcinoma.  EXAM: PORTABLE CHEST - 1 VIEW  COMPARISON:  CT of the chest on 07/09/2014  FINDINGS: Stable positioning of Port-A-Cath with the tip in the SVC. Density at the left lung base is associated with elevation of the left hemidiaphragm. This may represent atelectasis versus infiltrate. No pulmonary edema, pneumothorax or pleural fluid identified. The heart size and mediastinal contours are normal.  IMPRESSION:  Left lower lung atelectasis versus infiltrate with volume loss.   Electronically Signed   By: Aletta Edouard M.D.   On: 07/11/2014 08:54    ASSESSMENT AND PLAN: This is a very pleasant 58 years old white female recently diagnosed with extensive stage small cell lung cancer status post whole brain irradiation in addition to 6 cycles of systemic chemotherapy with carboplatin and etoposide. Unfortunately his recent CT scan of the chest, abdomen and pelvis showed evidence for disease recurrence with no retroperitoneal nodule along the left psoas muscle in addition to nodular pleural thickening along the pericardial border consistent with disease recurrence. I discussed the scan results and showed the images to the patient and her family. I recommended for her second line systemic chemotherapy with cisplatin 30 MG/M2 and irinotecan 65 MG/M2 on days 1 and 8 every 3 weeks. I discussed with the patient and her family the adverse effect of this treatment including but not limited to alopecia, myelosuppression, nausea and vomiting, peripheral neuropathy, liver or renal dysfunction. I had a lengthy discussion with the patient and her family regarding her poor prognosis and they have full understanding of the current situation and she would like to proceed with the chemotherapy.  She is expected to start the first cycle of this treatment later this week. She will come back for follow-up visit in 4 weeks with the start of cycle  #2. For the persistent headache this could be secondary to the recent brain lesion and treatment effect, I will start the patient on Decadron 4 mg by mouth twice a day. She was advised to call immediately if she has any concerning symptoms in the interval. The patient voices understanding of current disease status and treatment options and is in agreement with the current care plan.  All questions were answered. The patient knows to call the clinic with any problems, questions or concerns. We can certainly see the patient much sooner if necessary.  I spent 20 minutes counseling the patient face to face. The total time spent in the appointment was 30 minutes.  Disclaimer: This note was dictated with voice recognition software. Similar sounding words can inadvertently be transcribed and may not be corrected upon review.

## 2014-07-14 NOTE — Telephone Encounter (Signed)
Pt confirmed labs/ov per 11/12 POF, sent msg to add chemo, chemo added and gave pt AVS.... KJ

## 2014-07-14 NOTE — Progress Notes (Signed)
Put daughter's fmla form on nurse's desk

## 2014-07-15 ENCOUNTER — Encounter: Payer: Self-pay | Admitting: Internal Medicine

## 2014-07-15 NOTE — Progress Notes (Signed)
Faxed daughter's fmla form to [phone]

## 2014-07-17 ENCOUNTER — Other Ambulatory Visit (HOSPITAL_BASED_OUTPATIENT_CLINIC_OR_DEPARTMENT_OTHER): Payer: 59

## 2014-07-17 ENCOUNTER — Ambulatory Visit (HOSPITAL_BASED_OUTPATIENT_CLINIC_OR_DEPARTMENT_OTHER): Payer: 59

## 2014-07-17 ENCOUNTER — Other Ambulatory Visit: Payer: Self-pay | Admitting: Emergency Medicine

## 2014-07-17 ENCOUNTER — Encounter: Payer: Self-pay | Admitting: Internal Medicine

## 2014-07-17 DIAGNOSIS — C3412 Malignant neoplasm of upper lobe, left bronchus or lung: Secondary | ICD-10-CM

## 2014-07-17 DIAGNOSIS — Z95828 Presence of other vascular implants and grafts: Secondary | ICD-10-CM

## 2014-07-17 DIAGNOSIS — R609 Edema, unspecified: Secondary | ICD-10-CM

## 2014-07-17 DIAGNOSIS — Z5111 Encounter for antineoplastic chemotherapy: Secondary | ICD-10-CM

## 2014-07-17 DIAGNOSIS — C3490 Malignant neoplasm of unspecified part of unspecified bronchus or lung: Secondary | ICD-10-CM

## 2014-07-17 LAB — CBC WITH DIFFERENTIAL/PLATELET
BASO%: 0.5 % (ref 0.0–2.0)
Basophils Absolute: 0 10*3/uL (ref 0.0–0.1)
EOS%: 0.2 % (ref 0.0–7.0)
Eosinophils Absolute: 0 10*3/uL (ref 0.0–0.5)
HCT: 37.6 % (ref 34.8–46.6)
HEMOGLOBIN: 12.2 g/dL (ref 11.6–15.9)
LYMPH%: 19.3 % (ref 14.0–49.7)
MCH: 29.7 pg (ref 25.1–34.0)
MCHC: 32.4 g/dL (ref 31.5–36.0)
MCV: 91.6 fL (ref 79.5–101.0)
MONO#: 0.5 10*3/uL (ref 0.1–0.9)
MONO%: 9.5 % (ref 0.0–14.0)
NEUT#: 3.4 10*3/uL (ref 1.5–6.5)
NEUT%: 70.5 % (ref 38.4–76.8)
Platelets: 232 10*3/uL (ref 145–400)
RBC: 4.1 10*6/uL (ref 3.70–5.45)
RDW: 15.1 % — AB (ref 11.2–14.5)
WBC: 4.8 10*3/uL (ref 3.9–10.3)
lymph#: 0.9 10*3/uL (ref 0.9–3.3)

## 2014-07-17 LAB — COMPREHENSIVE METABOLIC PANEL (CC13)
ALBUMIN: 4.3 g/dL (ref 3.5–5.0)
ALK PHOS: 62 U/L (ref 40–150)
ALT: 42 U/L (ref 0–55)
AST: 14 U/L (ref 5–34)
Anion Gap: 13 mEq/L — ABNORMAL HIGH (ref 3–11)
BUN: 18.4 mg/dL (ref 7.0–26.0)
CO2: 27 mEq/L (ref 22–29)
Calcium: 10.7 mg/dL — ABNORMAL HIGH (ref 8.4–10.4)
Chloride: 102 mEq/L (ref 98–109)
Creatinine: 0.8 mg/dL (ref 0.6–1.1)
Glucose: 136 mg/dl (ref 70–140)
POTASSIUM: 3.8 meq/L (ref 3.5–5.1)
Sodium: 142 mEq/L (ref 136–145)
Total Bilirubin: 0.27 mg/dL (ref 0.20–1.20)
Total Protein: 7.7 g/dL (ref 6.4–8.3)

## 2014-07-17 MED ORDER — PALONOSETRON HCL INJECTION 0.25 MG/5ML
INTRAVENOUS | Status: AC
  Filled 2014-07-17: qty 5

## 2014-07-17 MED ORDER — PALONOSETRON HCL INJECTION 0.25 MG/5ML
0.2500 mg | Freq: Once | INTRAVENOUS | Status: AC
  Administered 2014-07-17: 0.25 mg via INTRAVENOUS

## 2014-07-17 MED ORDER — DEXAMETHASONE SODIUM PHOSPHATE 20 MG/5ML IJ SOLN
12.0000 mg | Freq: Once | INTRAMUSCULAR | Status: AC
  Administered 2014-07-17: 12 mg via INTRAVENOUS

## 2014-07-17 MED ORDER — DEXTROSE-NACL 5-0.45 % IV SOLN
Freq: Once | INTRAVENOUS | Status: AC
  Administered 2014-07-17: 09:00:00 via INTRAVENOUS
  Filled 2014-07-17: qty 10

## 2014-07-17 MED ORDER — ATROPINE SULFATE 1 MG/ML IJ SOLN
0.5000 mg | Freq: Once | INTRAMUSCULAR | Status: AC | PRN
  Administered 2014-07-17: 0.5 mg via INTRAVENOUS

## 2014-07-17 MED ORDER — DEXAMETHASONE SODIUM PHOSPHATE 20 MG/5ML IJ SOLN
INTRAMUSCULAR | Status: AC
  Filled 2014-07-17: qty 5

## 2014-07-17 MED ORDER — SODIUM CHLORIDE 0.9 % IJ SOLN
10.0000 mL | INTRAMUSCULAR | Status: DC | PRN
  Administered 2014-07-17: 10 mL
  Filled 2014-07-17: qty 10

## 2014-07-17 MED ORDER — FUROSEMIDE 20 MG PO TABS: 20.0000 mg | ORAL_TABLET | Freq: Every day | ORAL | Status: DC | PRN

## 2014-07-17 MED ORDER — HEPARIN SOD (PORK) LOCK FLUSH 100 UNIT/ML IV SOLN
500.0000 [IU] | Freq: Once | INTRAVENOUS | Status: AC | PRN
  Administered 2014-07-17: 500 [IU]
  Filled 2014-07-17: qty 5

## 2014-07-17 MED ORDER — SODIUM CHLORIDE 0.9 % IV SOLN
150.0000 mg | Freq: Once | INTRAVENOUS | Status: AC
  Administered 2014-07-17: 150 mg via INTRAVENOUS
  Filled 2014-07-17: qty 5

## 2014-07-17 MED ORDER — SODIUM CHLORIDE 0.9 % IV SOLN
Freq: Once | INTRAVENOUS | Status: AC
  Administered 2014-07-17: 09:00:00 via INTRAVENOUS

## 2014-07-17 MED ORDER — IRINOTECAN HCL CHEMO INJECTION 100 MG/5ML
65.0000 mg/m2 | Freq: Once | INTRAVENOUS | Status: AC
  Administered 2014-07-17: 122 mg via INTRAVENOUS
  Filled 2014-07-17: qty 6.1

## 2014-07-17 MED ORDER — SODIUM CHLORIDE 0.9 % IV SOLN
30.0000 mg/m2 | Freq: Once | INTRAVENOUS | Status: AC
  Administered 2014-07-17: 57 mg via INTRAVENOUS
  Filled 2014-07-17: qty 57

## 2014-07-17 MED ORDER — ATROPINE SULFATE 1 MG/ML IJ SOLN
INTRAMUSCULAR | Status: AC
  Filled 2014-07-17: qty 1

## 2014-07-17 MED ORDER — LIDOCAINE-PRILOCAINE 2.5-2.5 % EX CREA: 1.0000 "application " | TOPICAL_CREAM | CUTANEOUS | Status: DC | PRN

## 2014-07-17 NOTE — Progress Notes (Signed)
Put fmla form on nurse's desk °

## 2014-07-17 NOTE — Patient Instructions (Signed)
Cisplatin injection What is this medicine? CISPLATIN (SIS pla tin) is a chemotherapy drug. It targets fast dividing cells, like cancer cells, and causes these cells to die. This medicine is used to treat many types of cancer like bladder, ovarian, and testicular cancers. This medicine may be used for other purposes; ask your health care provider or pharmacist if you have questions. COMMON BRAND NAME(S): Platinol, Platinol -AQ What should I tell my health care provider before I take this medicine? They need to know if you have any of these conditions: -blood disorders -hearing problems -kidney disease -recent or ongoing radiation therapy -an unusual or allergic reaction to cisplatin, carboplatin, other chemotherapy, other medicines, foods, dyes, or preservatives -pregnant or trying to get pregnant -breast-feeding How should I use this medicine? This drug is given as an infusion into a vein. It is administered in a hospital or clinic by a specially trained health care professional. Talk to your pediatrician regarding the use of this medicine in children. Special care may be needed. Overdosage: If you think you have taken too much of this medicine contact a poison control center or emergency room at once. NOTE: This medicine is only for you. Do not share this medicine with others. What if I miss a dose? It is important not to miss a dose. Call your doctor or health care professional if you are unable to keep an appointment. What may interact with this medicine? -dofetilide -foscarnet -medicines for seizures -medicines to increase blood counts like filgrastim, pegfilgrastim, sargramostim -probenecid -pyridoxine used with altretamine -rituximab -some antibiotics like amikacin, gentamicin, neomycin, polymyxin B, streptomycin, tobramycin -sulfinpyrazone -vaccines -zalcitabine Talk to your doctor or health care professional before taking any of these  medicines: -acetaminophen -aspirin -ibuprofen -ketoprofen -naproxen This list may not describe all possible interactions. Give your health care provider a list of all the medicines, herbs, non-prescription drugs, or dietary supplements you use. Also tell them if you smoke, drink alcohol, or use illegal drugs. Some items may interact with your medicine. What should I watch for while using this medicine? Your condition will be monitored carefully while you are receiving this medicine. You will need important blood work done while you are taking this medicine. This drug may make you feel generally unwell. This is not uncommon, as chemotherapy can affect healthy cells as well as cancer cells. Report any side effects. Continue your course of treatment even though you feel ill unless your doctor tells you to stop. In some cases, you may be given additional medicines to help with side effects. Follow all directions for their use. Call your doctor or health care professional for advice if you get a fever, chills or sore throat, or other symptoms of a cold or flu. Do not treat yourself. This drug decreases your body's ability to fight infections. Try to avoid being around people who are sick. This medicine may increase your risk to bruise or bleed. Call your doctor or health care professional if you notice any unusual bleeding. Be careful brushing and flossing your teeth or using a toothpick because you may get an infection or bleed more easily. If you have any dental work done, tell your dentist you are receiving this medicine. Avoid taking products that contain aspirin, acetaminophen, ibuprofen, naproxen, or ketoprofen unless instructed by your doctor. These medicines may hide a fever. Do not become pregnant while taking this medicine. Women should inform their doctor if they wish to become pregnant or think they might be pregnant. There is a   potential for serious side effects to an unborn child. Talk to  your health care professional or pharmacist for more information. Do not breast-feed an infant while taking this medicine. Drink fluids as directed while you are taking this medicine. This will help protect your kidneys. Call your doctor or health care professional if you get diarrhea. Do not treat yourself. What side effects may I notice from receiving this medicine? Side effects that you should report to your doctor or health care professional as soon as possible: -allergic reactions like skin rash, itching or hives, swelling of the face, lips, or tongue -signs of infection - fever or chills, cough, sore throat, pain or difficulty passing urine -signs of decreased platelets or bleeding - bruising, pinpoint red spots on the skin, black, tarry stools, nosebleeds -signs of decreased red blood cells - unusually weak or tired, fainting spells, lightheadedness -breathing problems -changes in hearing -gout pain -low blood counts - This drug may decrease the number of white blood cells, red blood cells and platelets. You may be at increased risk for infections and bleeding. -nausea and vomiting -pain, swelling, redness or irritation at the injection site -pain, tingling, numbness in the hands or feet -problems with balance, movement -trouble passing urine or change in the amount of urine Side effects that usually do not require medical attention (report to your doctor or health care professional if they continue or are bothersome): -changes in vision -loss of appetite -metallic taste in the mouth or changes in taste This list may not describe all possible side effects. Call your doctor for medical advice about side effects. You may report side effects to FDA at 1-800-FDA-1088. Where should I keep my medicine? This drug is given in a hospital or clinic and will not be stored at home. NOTE: This sheet is a summary. It may not cover all possible information. If you have questions about this medicine,  talk to your doctor, pharmacist, or health care provider.  2015, [employer]vier/Gold Standard. (2007-11-20 14:40:54)  Irinotecan injection What is this medicine? IRINOTECAN (ir in oh TEE kan ) is a chemotherapy drug. It is used to treat colon and rectal cancer. This medicine may be used for other purposes; ask your health care provider or pharmacist if you have questions. COMMON BRAND NAME(S): Camptosar What should I tell my health care provider before I take this medicine? They need to know if you have any of these conditions: -blood disorders -dehydration -diarrhea -infection (especially a virus infection such as chickenpox, cold sores, or herpes) -liver disease -low blood counts, like low white cell, platelet, or red cell counts -recent or ongoing radiation therapy -an unusual or allergic reaction to irinotecan, sorbitol, other chemotherapy, other medicines, foods, dyes, or preservatives -pregnant or trying to get pregnant -breast-feeding How should I use this medicine? This drug is given as an infusion into a vein. It is administered in a hospital or clinic by a specially trained health care professional. Talk to your pediatrician regarding the use of this medicine in children. Special care may be needed. Overdosage: If you think you have taken too much of this medicine contact a poison control center or emergency room at once. NOTE: This medicine is only for you. Do not share this medicine with others. What if I miss a dose? It is important not to miss your dose. Call your doctor or health care professional if you are unable to keep an appointment. What may interact with this medicine? Do not take this medicine with  any of the following medications: -atazanavir -certain medicines for fungal infections like itraconazole and ketoconazole -St. John's Wort This medicine may also interact with the following medications: -dexamethasone -diuretics -laxatives -medicines for seizures like  carbamazepine, mephobarbital, phenobarbital, phenytoin, primidone -medicines to increase blood counts like filgrastim, pegfilgrastim, sargramostim -prochlorperazine -vaccines This list may not describe all possible interactions. Give your health care provider a list of all the medicines, herbs, non-prescription drugs, or dietary supplements you use. Also tell them if you smoke, drink alcohol, or use illegal drugs. Some items may interact with your medicine. What should I watch for while using this medicine? Your condition will be monitored carefully while you are receiving this medicine. You will need important blood work done while you are taking this medicine. This drug may make you feel generally unwell. This is not uncommon, as chemotherapy can affect healthy cells as well as cancer cells. Report any side effects. Continue your course of treatment even though you feel ill unless your doctor tells you to stop. In some cases, you may be given additional medicines to help with side effects. Follow all directions for their use. You may get drowsy or dizzy. Do not drive, use machinery, or do anything that needs mental alertness until you know how this medicine affects you. Do not stand or sit up quickly, especially if you are an older patient. This reduces the risk of dizzy or fainting spells. Call your doctor or health care professional for advice if you get a fever, chills or sore throat, or other symptoms of a cold or flu. Do not treat yourself. This drug decreases your body's ability to fight infections. Try to avoid being around people who are sick. This medicine may increase your risk to bruise or bleed. Call your doctor or health care professional if you notice any unusual bleeding. Be careful brushing and flossing your teeth or using a toothpick because you may get an infection or bleed more easily. If you have any dental work done, tell your dentist you are receiving this medicine. Avoid taking  products that contain aspirin, acetaminophen, ibuprofen, naproxen, or ketoprofen unless instructed by your doctor. These medicines may hide a fever. Do not become pregnant while taking this medicine. Women should inform their doctor if they wish to become pregnant or think they might be pregnant. There is a potential for serious side effects to an unborn child. Talk to your health care professional or pharmacist for more information. Do not breast-feed an infant while taking this medicine. What side effects may I notice from receiving this medicine? Side effects that you should report to your doctor or health care professional as soon as possible: -allergic reactions like skin rash, itching or hives, swelling of the face, lips, or tongue -low blood counts - this medicine may decrease the number of white blood cells, red blood cells and platelets. You may be at increased risk for infections and bleeding. -signs of infection - fever or chills, cough, sore throat, pain or difficulty passing urine -signs of decreased platelets or bleeding - bruising, pinpoint red spots on the skin, black, tarry stools, blood in the urine -signs of decreased red blood cells - unusually weak or tired, fainting spells, lightheadedness -breathing problems -chest pain -diarrhea -feeling faint or lightheaded, falls -flushing, runny nose, sweating during infusion -mouth sores or pain -pain, swelling, redness or irritation where injected -pain, swelling, warmth in the leg -pain, tingling, numbness in the hands or feet -problems with balance, talking, walking -stomach cramps,  pain -trouble passing urine or change in the amount of urine -vomiting as to be unable to hold down drinks or food -yellowing of the eyes or skin Side effects that usually do not require medical attention (report to your doctor or health care professional if they continue or are bothersome): -constipation -hair loss -headache -loss of  appetite -nausea, vomiting -stomach upset This list may not describe all possible side effects. Call your doctor for medical advice about side effects. You may report side effects to FDA at 1-800-FDA-1088. Where should I keep my medicine? This drug is given in a hospital or clinic and will not be stored at home. NOTE: This sheet is a summary. It may not cover all possible information. If you have questions about this medicine, talk to your doctor, pharmacist, or health care provider.  2015, Elsevier/Gold Standard. (2013-02-11 16:29:32)  [facility] Discharge Instructions for Patients Receiving Chemotherapy  Today you received the following chemotherapy agents Camptosar and Cisplatin.  To help prevent nausea and vomiting after your treatment, we encourage you to take your nausea medication.   If you develop nausea and vomiting that is not controlled by your nausea medication, call the clinic.   BELOW ARE SYMPTOMS THAT SHOULD BE REPORTED IMMEDIATELY:  *FEVER GREATER THAN 100.5 F  *CHILLS WITH OR WITHOUT FEVER  NAUSEA AND VOMITING THAT IS NOT CONTROLLED WITH YOUR NAUSEA MEDICATION  *UNUSUAL SHORTNESS OF BREATH  *UNUSUAL BRUISING OR BLEEDING  TENDERNESS IN MOUTH AND THROAT WITH OR WITHOUT PRESENCE OF ULCERS  *URINARY PROBLEMS  *BOWEL PROBLEMS  UNUSUAL RASH Items with * indicate a potential emergency and should be followed up as soon as possible.  Feel free to call the clinic you have any questions or concerns. The clinic phone number is [phone].

## 2014-07-18 ENCOUNTER — Telehealth: Payer: Self-pay | Admitting: *Deleted

## 2014-07-18 NOTE — Telephone Encounter (Signed)
No adverse effect from chemo. Confirmed she has Imodium AD on hand if diarrhea begins. Eating well and pushing fluids

## 2014-07-21 ENCOUNTER — Encounter: Payer: Self-pay | Admitting: Internal Medicine

## 2014-07-21 NOTE — Progress Notes (Signed)
Put [employer] disability form on nurse's desk

## 2014-07-23 ENCOUNTER — Other Ambulatory Visit: Payer: Self-pay | Admitting: *Deleted

## 2014-07-23 ENCOUNTER — Encounter: Payer: Self-pay | Admitting: Internal Medicine

## 2014-07-23 DIAGNOSIS — C3490 Malignant neoplasm of unspecified part of unspecified bronchus or lung: Secondary | ICD-10-CM

## 2014-07-23 NOTE — Progress Notes (Signed)
Faxed disability form to [facility] @ [phone]

## 2014-07-25 ENCOUNTER — Other Ambulatory Visit (HOSPITAL_BASED_OUTPATIENT_CLINIC_OR_DEPARTMENT_OTHER): Payer: 59

## 2014-07-25 ENCOUNTER — Ambulatory Visit (HOSPITAL_BASED_OUTPATIENT_CLINIC_OR_DEPARTMENT_OTHER): Payer: 59

## 2014-07-25 ENCOUNTER — Encounter: Payer: Self-pay | Admitting: Internal Medicine

## 2014-07-25 DIAGNOSIS — C3412 Malignant neoplasm of upper lobe, left bronchus or lung: Secondary | ICD-10-CM

## 2014-07-25 DIAGNOSIS — C7931 Secondary malignant neoplasm of brain: Secondary | ICD-10-CM

## 2014-07-25 DIAGNOSIS — Z5111 Encounter for antineoplastic chemotherapy: Secondary | ICD-10-CM

## 2014-07-25 DIAGNOSIS — C3490 Malignant neoplasm of unspecified part of unspecified bronchus or lung: Secondary | ICD-10-CM

## 2014-07-25 LAB — COMPREHENSIVE METABOLIC PANEL (CC13)
ALK PHOS: 54 U/L (ref 40–150)
ALT: 38 U/L (ref 0–55)
AST: 12 U/L (ref 5–34)
Albumin: 3.7 g/dL (ref 3.5–5.0)
Anion Gap: 13 mEq/L — ABNORMAL HIGH (ref 3–11)
BUN: 23.7 mg/dL (ref 7.0–26.0)
CO2: 26 mEq/L (ref 22–29)
CREATININE: 0.7 mg/dL (ref 0.6–1.1)
Calcium: 10.1 mg/dL (ref 8.4–10.4)
Chloride: 101 mEq/L (ref 98–109)
Glucose: 120 mg/dl (ref 70–140)
Potassium: 4.2 mEq/L (ref 3.5–5.1)
Sodium: 141 mEq/L (ref 136–145)
Total Bilirubin: <0.2 mg/dL (ref 0.20–1.20)
Total Protein: 7 g/dL (ref 6.4–8.3)

## 2014-07-25 LAB — CBC WITH DIFFERENTIAL/PLATELET
BASO%: 0.2 % (ref 0.0–2.0)
BASOS ABS: 0 10*3/uL (ref 0.0–0.1)
EOS ABS: 0 10*3/uL (ref 0.0–0.5)
EOS%: 0.5 % (ref 0.0–7.0)
HCT: 36.4 % (ref 34.8–46.6)
HGB: 11.9 g/dL (ref 11.6–15.9)
LYMPH%: 18.2 % (ref 14.0–49.7)
MCH: 30 pg (ref 25.1–34.0)
MCHC: 32.7 g/dL (ref 31.5–36.0)
MCV: 91.7 fL (ref 79.5–101.0)
MONO#: 0.4 10*3/uL (ref 0.1–0.9)
MONO%: 8.2 % (ref 0.0–14.0)
NEUT%: 72.9 % (ref 38.4–76.8)
NEUTROS ABS: 3.1 10*3/uL (ref 1.5–6.5)
PLATELETS: 211 10*3/uL (ref 145–400)
RBC: 3.97 10*6/uL (ref 3.70–5.45)
RDW: 13.7 % (ref 11.2–14.5)
WBC: 4.3 10*3/uL (ref 3.9–10.3)
lymph#: 0.8 10*3/uL — ABNORMAL LOW (ref 0.9–3.3)

## 2014-07-25 LAB — MAGNESIUM (CC13): Magnesium: 2.2 mg/dl (ref 1.5–2.5)

## 2014-07-25 MED ORDER — ATROPINE SULFATE 1 MG/ML IJ SOLN
INTRAMUSCULAR | Status: AC
  Filled 2014-07-25: qty 1

## 2014-07-25 MED ORDER — IRINOTECAN HCL CHEMO INJECTION 100 MG/5ML
65.0000 mg/m2 | Freq: Once | INTRAVENOUS | Status: AC
  Administered 2014-07-25: 122 mg via INTRAVENOUS
  Filled 2014-07-25: qty 6.1

## 2014-07-25 MED ORDER — POTASSIUM CHLORIDE 2 MEQ/ML IV SOLN
Freq: Once | INTRAVENOUS | Status: AC
  Administered 2014-07-25: 10:00:00 via INTRAVENOUS
  Filled 2014-07-25: qty 10

## 2014-07-25 MED ORDER — HEPARIN SOD (PORK) LOCK FLUSH 100 UNIT/ML IV SOLN
500.0000 [IU] | Freq: Once | INTRAVENOUS | Status: AC | PRN
  Administered 2014-07-25: 500 [IU]
  Filled 2014-07-25: qty 5

## 2014-07-25 MED ORDER — DEXAMETHASONE SODIUM PHOSPHATE 20 MG/5ML IJ SOLN
12.0000 mg | Freq: Once | INTRAMUSCULAR | Status: AC
  Administered 2014-07-25: 12 mg via INTRAVENOUS

## 2014-07-25 MED ORDER — PALONOSETRON HCL INJECTION 0.25 MG/5ML
0.2500 mg | Freq: Once | INTRAVENOUS | Status: AC
Start: 2014-07-25 — End: 2014-07-25
  Administered 2014-07-25: 0.25 mg via INTRAVENOUS

## 2014-07-25 MED ORDER — DEXAMETHASONE SODIUM PHOSPHATE 20 MG/5ML IJ SOLN
INTRAMUSCULAR | Status: AC
  Filled 2014-07-25: qty 5

## 2014-07-25 MED ORDER — SODIUM CHLORIDE 0.9 % IV SOLN
Freq: Once | INTRAVENOUS | Status: AC
  Administered 2014-07-25: 10:00:00 via INTRAVENOUS

## 2014-07-25 MED ORDER — PALONOSETRON HCL INJECTION 0.25 MG/5ML
INTRAVENOUS | Status: AC
  Filled 2014-07-25: qty 5

## 2014-07-25 MED ORDER — SODIUM CHLORIDE 0.9 % IV SOLN
150.0000 mg | Freq: Once | INTRAVENOUS | Status: AC
  Administered 2014-07-25: 150 mg via INTRAVENOUS
  Filled 2014-07-25: qty 5

## 2014-07-25 MED ORDER — ATROPINE SULFATE 1 MG/ML IJ SOLN
0.5000 mg | Freq: Once | INTRAMUSCULAR | Status: AC | PRN
  Administered 2014-07-25: 0.5 mg via INTRAVENOUS

## 2014-07-25 MED ORDER — SODIUM CHLORIDE 0.9 % IV SOLN
30.0000 mg/m2 | Freq: Once | INTRAVENOUS | Status: AC
  Administered 2014-07-25: 57 mg via INTRAVENOUS
  Filled 2014-07-25: qty 57

## 2014-07-25 MED ORDER — SODIUM CHLORIDE 0.9 % IJ SOLN
10.0000 mL | INTRAMUSCULAR | Status: DC | PRN
  Administered 2014-07-25: 10 mL
  Filled 2014-07-25: qty 10

## 2014-07-25 NOTE — Progress Notes (Signed)
Faxed disability form to [facility] @ [phone]

## 2014-07-25 NOTE — Patient Instructions (Signed)
[facility] Discharge Instructions for Patients Receiving Chemotherapy  Today you received the following chemotherapy agents Cisplatin.  To help prevent nausea and vomiting after your treatment, we encourage you to take your nausea medication as directed.    If you develop nausea and vomiting that is not controlled by your nausea medication, call the clinic.   BELOW ARE SYMPTOMS THAT SHOULD BE REPORTED IMMEDIATELY:  *FEVER GREATER THAN 100.5 F  *CHILLS WITH OR WITHOUT FEVER  NAUSEA AND VOMITING THAT IS NOT CONTROLLED WITH YOUR NAUSEA MEDICATION  *UNUSUAL SHORTNESS OF BREATH  *UNUSUAL BRUISING OR BLEEDING  TENDERNESS IN MOUTH AND THROAT WITH OR WITHOUT PRESENCE OF ULCERS  *URINARY PROBLEMS  *BOWEL PROBLEMS  UNUSUAL RASH Items with * indicate a potential emergency and should be followed up as soon as possible.  Feel free to call the clinic you have any questions or concerns. The clinic phone number is [phone].

## 2014-08-03 NOTE — Progress Notes (Signed)
Radiation Oncology         [phone]   Name: Carol Bond   Date: 08/04/2014   MRN: [id number]  DOB: 1956/03/14    Multidisciplinary [facility] Sp[facility] Oncology Clinic Follow-Up Visit Note  CC: [name], MD  [name], *    ICD-9-CM ICD-10-CM   1. Brain metastases 198.3 C79.31 fluconazole (DIFLUCAN) 100 MG tablet    Diagnosis:   58 year old woman with 3 isolated recurrent brain metastases status post previous whole brain irradiation from primary small cell carcinoma lung cancer  Interval Since Last Radiation:  4  weeks  Narrative:  The patient returns today for routine follow-up.  The recent films were presented in our multidisciplinary conference with neuroradiology just prior to the clinic.  She is essentially asymptomatic. However, she does suffer with some thrush symptoms.                              ALLERGIES:  is allergic to actos and vicodin.  Meds: Current Outpatient Prescriptions  Medication Sig Dispense Refill  . albuterol (PROVENTIL HFA;VENTOLIN HFA) 108 (90 BASE) MCG/ACT inhaler Inhale 2 puffs into the lungs every 6 (six) hours as needed for wheezing or shortness of breath. 1 Inhaler 2  . Canagliflozin (INVOKANA) 300 MG TABS Take 1 tablet by mouth daily before breakfast.    . dexamethasone (DECADRON) 4 MG tablet Take 1 tablet (4 mg total) by mouth 4 (four) times daily. 40 tablet 0  . diphenhydrAMINE (SOMINEX) 25 MG tablet Take 25 mg by mouth every 6 (six) hours as needed for allergies or sleep.     Marland Kitchen escitalopram (LEXAPRO) 10 MG tablet Take 10 mg by mouth at bedtime.     Marland Kitchen esomeprazole (NEXIUM) 20 MG capsule Take 20 mg by mouth daily as needed (for acid reflex).    . fenofibrate 160 MG tablet Take 160 mg by mouth at bedtime.     . furosemide (LASIX) 20 MG tablet Take 1 tablet (20 mg total) by mouth daily as needed. 30 tablet 0  . glimepiride (AMARYL) 4 MG tablet Take 4 mg by mouth daily with breakfast.    . levothyroxine (SYNTHROID,  LEVOTHROID) 150 MCG tablet Take 150 mcg by mouth daily before breakfast.    . lidocaine-prilocaine (EMLA) cream Apply 1 application topically as needed. Apply to port a cath site 90 minutes prior to chemotherapy appointment. 30 g 0  . LORazepam (ATIVAN) 1 MG tablet Take 0.5-1 mg by mouth 2 (two) times daily.     Marland Kitchen losartan (COZAAR) 50 MG tablet Take 50 mg by mouth at bedtime.     Marland Kitchen oxyCODONE-acetaminophen (PERCOCET) 5-325 MG per tablet Take 1-2 tablets by mouth every 4 (four) hours as needed. 40 tablet 0  . sitaGLIPtin-metformin (JANUMET) 50-1000 MG per tablet Take 1 tablet by mouth 2 (two) times daily with a meal.    . vitamin B-12 (CYANOCOBALAMIN) 100 MCG tablet Take 100 mcg by mouth daily.    . fluconazole (DIFLUCAN) 100 MG tablet Take 1 tablet (100 mg total) by mouth daily. Take 2 on first day, stop after 10 days 11 tablet 0  . ondansetron (ZOFRAN) 8 MG tablet Take 8 mg by mouth every 8 (eight) hours as needed.     No current facility-administered medications for this encounter.    Physical Findings: The patient is in no acute distress. Patient is alert and oriented.  weight is 174 lb 4.8 oz (  79.062 kg). Her blood pressure is 105/68 and her pulse is 100. Her respiration is 16. .  Patient has minimal oral thrush.  No significant changes.  Lab Findings: Lab Results  Component Value Date   WBC 4.3 07/25/2014   HGB 11.9 07/25/2014   HCT 36.4 07/25/2014   MCV 91.7 07/25/2014   PLT 211 07/25/2014    @LASTCHEM @  Radiographic Findings: Ct Chest W Contrast  07/09/2014   CLINICAL DATA:  Restaging lung carcinoma diagnosed April 2015. Bone metastasis previously diagnosed. Radiation therapy and chemotherapy complete. Additional history of brain metastasis.  EXAM: CT CHEST, ABDOMEN, AND PELVIS WITH CONTRAST  TECHNIQUE: Multidetector CT imaging of the chest, abdomen and pelvis was performed following the standard protocol during bolus administration of intravenous contrast.  CONTRAST:  116mL  OMNIPAQUE IOHEXOL 300 MG/ML  SOLN  COMPARISON:  CT 05/02/2014, PET-CT 01/01/2014  FINDINGS: CT CHEST FINDINGS  No axillary supraclavicular lymphadenopathy. There is a port in the medial left chest wall. No mediastinal lymphadenopathy. No pericardial fluid.  There is nodular thickening along the pericardial surface of the upper left heart. This nodularity is increased compared to prior with a discrete nodule measuring 14 mm x 12 mm on image 29, series 2 where previously there was ill-defined 5 mm thickening. There is a second focus of nodular thickening along the pericardial surface of the left lower lobe measuring 15 mm (image 35, series 2). No lesion was seen at this site on comparison exam  There is a consolidative pattern in the superior left lung on image 19, series 4 which is increased compared to prior. This may relate radiation change. There is small effusion in the posterior left hemi thorax superiorly which is increased compared prior. Evaluation of the right lung demonstrates no nodularity.  There is no central pulmonary embolism.  CT ABDOMEN AND PELVIS FINDINGS  Hepatobiliary: Hypervascular lesions in the lateral left hepatic lobe measuring 10 mm is not changed. No additional lesions.  Pancreas: Pancreas is normal. No duct dilatation. No pancreatic inflammation.  Spleen: Normal spleen  Adrenals/urinary tract: Nodule enlargement of the left adrenal gland to 2 cm is unchanged. Minimal enlargement of the medial limb of the right adrenal gland is also noted and not significantly changed. No enhancing renal lesion. No ureteral or bladder abnormality.  Stomach/Bowel: Stomach, small bowel, and colon are unremarkable. Diverticular disease of the colon.  Vascular/Lymphatic: Abdominal aorta is normal caliber. There is no retroperitoneal or periportal lymphadenopathy. No pelvic lymphadenopathy.  Reproductive: Post hysterectomy.  No adnexal abnormality.  Musculoskeletal: No aggressive osseous lesion.  Other: Within  the retroperitoneum adjacent to the left psoas muscle there is a new 6 mm nodule on image 94, series 2.  IMPRESSION: Chest Impression:  1. New nodular pleural thickening along the pericardial border is consistent with recurrence of pleural metastatic lung disease. 2. New consolidation in the superior left lung may represent radiation change but cannot exclude local recurrence.  Abdomen / Pelvis Impression:  1. New retroperitoneal nodule along the left psoas muscle is consistent with disease recurrence. This is site of prior metastasis on PET-CT scan. 2. Stable left adrenal metastasis 3. Stable hypervascular lesion within the left hepatic lobe is likely benign.   Electronically Signed   By: Suzy Bouchard M.D.   On: 07/09/2014 15:06   Ct Angio Chest Pe W/cm &/or Wo Cm  07/11/2014   CLINICAL DATA:  Chest pain since this morning. Patient does have a history of lung cancer.  EXAM: CT ANGIOGRAPHY CHEST WITH  CONTRAST  TECHNIQUE: Multidetector CT imaging of the chest was performed using the standard protocol during bolus administration of intravenous contrast. Multiplanar CT image reconstructions and MIPs were obtained to evaluate the vascular anatomy.  CONTRAST:  180mL OMNIPAQUE IOHEXOL 300 MG/ML  SOLN  COMPARISON:  Multiple prior chest CTs. The most recent is 07/09/2014  FINDINGS: Chest wall: No supraclavicular or axillary mass or adenopathy. The left-sided Port-A-Cath is stable. No obvious breast masses. The bony thorax is intact. No destructive bone lesions or spinal canal compromise.  Mediastinum: The heart is normal in size and stable. There is a small pericardial effusion. Stable epicardial masses. Stable left-sided mediastinal tumor. Stable borderline enlarged mediastinal lymph nodes. The aorta is normal in caliber. No dissection. Stable coronary artery calcifications. The pulmonary arterial tree is fairly well opacified. No filling defects to suggest pulmonary emboli.  Lungs: Stable radiation changes  involving the left upper lobe. No new/acute pulmonary findings. Small left pleural effusion. No metastatic pulmonary nodules.  Upper abdomen: Stable left adrenal gland metastasis. No liver lesions.  Review of the MIP images confirms the above findings.  IMPRESSION: No CT findings for pulmonary embolism.  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  No new/acute pulmonary findings.   Electronically Signed   By: Kalman Jewels M.D.   On: 07/11/2014 10:37   Ct Abdomen Pelvis W Contrast  07/09/2014   CLINICAL DATA:  Restaging lung carcinoma diagnosed April 2015. Bone metastasis previously diagnosed. Radiation therapy and chemotherapy complete. Additional history of brain metastasis.  EXAM: CT CHEST, ABDOMEN, AND PELVIS WITH CONTRAST  TECHNIQUE: Multidetector CT imaging of the chest, abdomen and pelvis was performed following the standard protocol during bolus administration of intravenous contrast.  CONTRAST:  182mL OMNIPAQUE IOHEXOL 300 MG/ML  SOLN  COMPARISON:  CT 05/02/2014, PET-CT 01/01/2014  FINDINGS: CT CHEST FINDINGS  No axillary supraclavicular lymphadenopathy. There is a port in the medial left chest wall. No mediastinal lymphadenopathy. No pericardial fluid.  There is nodular thickening along the pericardial surface of the upper left heart. This nodularity is increased compared to prior with a discrete nodule measuring 14 mm x 12 mm on image 29, series 2 where previously there was ill-defined 5 mm thickening. There is a second focus of nodular thickening along the pericardial surface of the left lower lobe measuring 15 mm (image 35, series 2). No lesion was seen at this site on comparison exam  There is a consolidative pattern in the superior left lung on image 19, series 4 which is increased compared to prior. This may relate radiation change. There is small effusion in the posterior left hemi thorax superiorly which is increased compared prior. Evaluation of the right lung demonstrates no  nodularity.  There is no central pulmonary embolism.  CT ABDOMEN AND PELVIS FINDINGS  Hepatobiliary: Hypervascular lesions in the lateral left hepatic lobe measuring 10 mm is not changed. No additional lesions.  Pancreas: Pancreas is normal. No duct dilatation. No pancreatic inflammation.  Spleen: Normal spleen  Adrenals/urinary tract: Nodule enlargement of the left adrenal gland to 2 cm is unchanged. Minimal enlargement of the medial limb of the right adrenal gland is also noted and not significantly changed. No enhancing renal lesion. No ureteral or bladder abnormality.  Stomach/Bowel: Stomach, small bowel, and colon are unremarkable. Diverticular disease of the colon.  Vascular/Lymphatic: Abdominal aorta is normal caliber. There is no retroperitoneal or periportal lymphadenopathy. No pelvic lymphadenopathy.  Reproductive: Post hysterectomy.  No adnexal abnormality.  Musculoskeletal: No aggressive osseous  lesion.  Other: Within the retroperitoneum adjacent to the left psoas muscle there is a new 6 mm nodule on image 94, series 2.  IMPRESSION: Chest Impression:  1. New nodular pleural thickening along the pericardial border is consistent with recurrence of pleural metastatic lung disease. 2. New consolidation in the superior left lung may represent radiation change but cannot exclude local recurrence.  Abdomen / Pelvis Impression:  1. New retroperitoneal nodule along the left psoas muscle is consistent with disease recurrence. This is site of prior metastasis on PET-CT scan. 2. Stable left adrenal metastasis 3. Stable hypervascular lesion within the left hepatic lobe is likely benign.   Electronically Signed   By: Suzy Bouchard M.D.   On: 07/09/2014 15:06   Dg Chest Port 1 View  07/11/2014   CLINICAL DATA:  Left-sided chest pain, shortness of breath and history of lung carcinoma.  EXAM: PORTABLE CHEST - 1 VIEW  COMPARISON:  CT of the chest on 07/09/2014  FINDINGS: Stable positioning of Port-A-Cath with the  tip in the SVC. Density at the left lung base is associated with elevation of the left hemidiaphragm. This may represent atelectasis versus infiltrate. No pulmonary edema, pneumothorax or pleural fluid identified. The heart size and mediastinal contours are normal.  IMPRESSION: Left lower lung atelectasis versus infiltrate with volume loss.   Electronically Signed   By: Aletta Edouard M.D.   On: 07/11/2014 08:54    Impression:  The patient is recovering from the effects of radiation.    Plan:  Given Diflucan for 10 days. Given steroid taper over the next 2 weeks. Follow-up after a three-month MRI  _____________________________________  [name]. Tammi Klippel, M.D.

## 2014-08-04 ENCOUNTER — Encounter: Payer: Self-pay | Admitting: Radiation Oncology

## 2014-08-04 ENCOUNTER — Ambulatory Visit
Admission: RE | Admit: 2014-08-04 | Discharge: 2014-08-04 | Disposition: A | Discharge status: Home / Self Care | Payer: 59 | Source: Ambulatory Visit | Attending: Radiation Oncology | Admitting: Radiation Oncology

## 2014-08-04 VITALS — BP 105/68 | HR 100 | Resp 16 | Wt 174.3 lb

## 2014-08-04 DIAGNOSIS — C7931 Secondary malignant neoplasm of brain: Secondary | ICD-10-CM

## 2014-08-04 MED ORDER — FLUCONAZOLE 100 MG PO TABS: 100.0000 mg | ORAL_TABLET | Freq: Every day | ORAL | Status: DC

## 2014-08-04 NOTE — Progress Notes (Signed)
Cut dexamethasone 4 mg back to taking 1/2 pill (2 mg) twice daily for one week, Then cut back to 1/2 pill (2 mg) once daily for one week, Then stop

## 2014-08-04 NOTE — Progress Notes (Signed)
Radiation Oncology         [phone]   Name: PERCY COMP   Date: 08/04/2014   MRN: [id number]  DOB: 07/11/1956    Multidisciplinary Brain [facility] Spine Oncology Clinic Follow-Up Visit Note  CC: [name], MD  [name], *    ICD-9-CM ICD-10-CM   1. Brain metastases 198.3 C79.31    Diagnosis:   58 year old woman with 3 isolated recurrent brain metastases status post previous whole brain irradiation from primary small cell carcinoma lung cancer  Interval Since Last Radiation:  4  weeks  Narrative:  The patient returns today for routine follow-up.  The recent films were presented in our multidisciplinary conference with neuroradiology just prior to the clinic.  Some headaches.                              ALLERGIES:  is allergic to actos and vicodin.  Meds: Current Outpatient Prescriptions  Medication Sig Dispense Refill  . albuterol (PROVENTIL HFA;VENTOLIN HFA) 108 (90 BASE) MCG/ACT inhaler Inhale 2 puffs into the lungs every 6 (six) hours as needed for wheezing or shortness of breath. 1 Inhaler 2  . Canagliflozin (INVOKANA) 300 MG TABS Take 1 tablet by mouth daily before breakfast.    . dexamethasone (DECADRON) 4 MG tablet Take 1 tablet (4 mg total) by mouth 4 (four) times daily. 40 tablet 0  . diphenhydrAMINE (SOMINEX) 25 MG tablet Take 25 mg by mouth every 6 (six) hours as needed for allergies or sleep.     Marland Kitchen escitalopram (LEXAPRO) 10 MG tablet Take 10 mg by mouth at bedtime.     Marland Kitchen esomeprazole (NEXIUM) 20 MG capsule Take 20 mg by mouth daily as needed (for acid reflex).    . fenofibrate 160 MG tablet Take 160 mg by mouth at bedtime.     . furosemide (LASIX) 20 MG tablet Take 1 tablet (20 mg total) by mouth daily as needed. 30 tablet 0  . glimepiride (AMARYL) 4 MG tablet Take 4 mg by mouth daily with breakfast.    . levothyroxine (SYNTHROID, LEVOTHROID) 150 MCG tablet Take 150 mcg by mouth daily before breakfast.    . lidocaine-prilocaine (EMLA) cream  Apply 1 application topically as needed. Apply to port a cath site 90 minutes prior to chemotherapy appointment. 30 g 0  . LORazepam (ATIVAN) 1 MG tablet Take 0.5-1 mg by mouth 2 (two) times daily.     Marland Kitchen losartan (COZAAR) 50 MG tablet Take 50 mg by mouth at bedtime.     Marland Kitchen oxyCODONE-acetaminophen (PERCOCET) 5-325 MG per tablet Take 1-2 tablets by mouth every 4 (four) hours as needed. 40 tablet 0  . sitaGLIPtin-metformin (JANUMET) 50-1000 MG per tablet Take 1 tablet by mouth 2 (two) times daily with a meal.    . vitamin B-12 (CYANOCOBALAMIN) 100 MCG tablet Take 100 mcg by mouth daily.    . ondansetron (ZOFRAN) 8 MG tablet Take 8 mg by mouth every 8 (eight) hours as needed.     No current facility-administered medications for this encounter.    Physical Findings: The patient is in no acute distress. Patient is alert and oriented.  weight is 174 lb 4.8 oz (79.062 kg). Her blood pressure is 105/68 and her pulse is 100. Her respiration is 16. .  No significant changes.  Lab Findings: Lab Results  Component Value Date   WBC 4.3 07/25/2014   HGB 11.9 07/25/2014   HCT  36.4 07/25/2014   MCV 91.7 07/25/2014   PLT 211 07/25/2014    @LASTCHEM @  Radiographic Findings: Ct Chest W Contrast  07/09/2014   CLINICAL DATA:  Restaging lung carcinoma diagnosed April 2015. Bone metastasis previously diagnosed. Radiation therapy and chemotherapy complete. Additional history of brain metastasis.  EXAM: CT CHEST, ABDOMEN, AND PELVIS WITH CONTRAST  TECHNIQUE: Multidetector CT imaging of the chest, abdomen and pelvis was performed following the standard protocol during bolus administration of intravenous contrast.  CONTRAST:  123mL OMNIPAQUE IOHEXOL 300 MG/ML  SOLN  COMPARISON:  CT 05/02/2014, PET-CT 01/01/2014  FINDINGS: CT CHEST FINDINGS  No axillary supraclavicular lymphadenopathy. There is a port in the medial left chest wall. No mediastinal lymphadenopathy. No pericardial fluid.  There is nodular thickening  along the pericardial surface of the upper left heart. This nodularity is increased compared to prior with a discrete nodule measuring 14 mm x 12 mm on image 29, series 2 where previously there was ill-defined 5 mm thickening. There is a second focus of nodular thickening along the pericardial surface of the left lower lobe measuring 15 mm (image 35, series 2). No lesion was seen at this site on comparison exam  There is a consolidative pattern in the superior left lung on image 19, series 4 which is increased compared to prior. This may relate radiation change. There is small effusion in the posterior left hemi thorax superiorly which is increased compared prior. Evaluation of the right lung demonstrates no nodularity.  There is no central pulmonary embolism.  CT ABDOMEN AND PELVIS FINDINGS  Hepatobiliary: Hypervascular lesions in the lateral left hepatic lobe measuring 10 mm is not changed. No additional lesions.  Pancreas: Pancreas is normal. No duct dilatation. No pancreatic inflammation.  Spleen: Normal spleen  Adrenals/urinary tract: Nodule enlargement of the left adrenal gland to 2 cm is unchanged. Minimal enlargement of the medial limb of the right adrenal gland is also noted and not significantly changed. No enhancing renal lesion. No ureteral or bladder abnormality.  Stomach/Bowel: Stomach, small bowel, and colon are unremarkable. Diverticular disease of the colon.  Vascular/Lymphatic: Abdominal aorta is normal caliber. There is no retroperitoneal or periportal lymphadenopathy. No pelvic lymphadenopathy.  Reproductive: Post hysterectomy.  No adnexal abnormality.  Musculoskeletal: No aggressive osseous lesion.  Other: Within the retroperitoneum adjacent to the left psoas muscle there is a new 6 mm nodule on image 94, series 2.  IMPRESSION: Chest Impression:  1. New nodular pleural thickening along the pericardial border is consistent with recurrence of pleural metastatic lung disease. 2. New consolidation  in the superior left lung may represent radiation change but cannot exclude local recurrence.  Abdomen / Pelvis Impression:  1. New retroperitoneal nodule along the left psoas muscle is consistent with disease recurrence. This is site of prior metastasis on PET-CT scan. 2. Stable left adrenal metastasis 3. Stable hypervascular lesion within the left hepatic lobe is likely benign.   Electronically Signed   By: Suzy Bouchard M.D.   On: 07/09/2014 15:06   Ct Angio Chest Pe W/cm &/or Wo Cm  07/11/2014   CLINICAL DATA:  Chest pain since this morning. Patient does have a history of lung cancer.  EXAM: CT ANGIOGRAPHY CHEST WITH CONTRAST  TECHNIQUE: Multidetector CT imaging of the chest was performed using the standard protocol during bolus administration of intravenous contrast. Multiplanar CT image reconstructions and MIPs were obtained to evaluate the vascular anatomy.  CONTRAST:  140mL OMNIPAQUE IOHEXOL 300 MG/ML  SOLN  COMPARISON:  Multiple prior  chest CTs. The most recent is 07/09/2014  FINDINGS: Chest wall: No supraclavicular or axillary mass or adenopathy. The left-sided Port-A-Cath is stable. No obvious breast masses. The bony thorax is intact. No destructive bone lesions or spinal canal compromise.  Mediastinum: The heart is normal in size and stable. There is a small pericardial effusion. Stable epicardial masses. Stable left-sided mediastinal tumor. Stable borderline enlarged mediastinal lymph nodes. The aorta is normal in caliber. No dissection. Stable coronary artery calcifications. The pulmonary arterial tree is fairly well opacified. No filling defects to suggest pulmonary emboli.  Lungs: Stable radiation changes involving the left upper lobe. No new/acute pulmonary findings. Small left pleural effusion. No metastatic pulmonary nodules.  Upper abdomen: Stable left adrenal gland metastasis. No liver lesions.  Review of the MIP images confirms the above findings.  IMPRESSION: No CT findings for  pulmonary embolism.  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  No new/acute pulmonary findings.   Electronically Signed   By: Kalman Jewels M.D.   On: 07/11/2014 10:37   Ct Abdomen Pelvis W Contrast  07/09/2014   CLINICAL DATA:  Restaging lung carcinoma diagnosed April 2015. Bone metastasis previously diagnosed. Radiation therapy and chemotherapy complete. Additional history of brain metastasis.  EXAM: CT CHEST, ABDOMEN, AND PELVIS WITH CONTRAST  TECHNIQUE: Multidetector CT imaging of the chest, abdomen and pelvis was performed following the standard protocol during bolus administration of intravenous contrast.  CONTRAST:  165mL OMNIPAQUE IOHEXOL 300 MG/ML  SOLN  COMPARISON:  CT 05/02/2014, PET-CT 01/01/2014  FINDINGS: CT CHEST FINDINGS  No axillary supraclavicular lymphadenopathy. There is a port in the medial left chest wall. No mediastinal lymphadenopathy. No pericardial fluid.  There is nodular thickening along the pericardial surface of the upper left heart. This nodularity is increased compared to prior with a discrete nodule measuring 14 mm x 12 mm on image 29, series 2 where previously there was ill-defined 5 mm thickening. There is a second focus of nodular thickening along the pericardial surface of the left lower lobe measuring 15 mm (image 35, series 2). No lesion was seen at this site on comparison exam  There is a consolidative pattern in the superior left lung on image 19, series 4 which is increased compared to prior. This may relate radiation change. There is small effusion in the posterior left hemi thorax superiorly which is increased compared prior. Evaluation of the right lung demonstrates no nodularity.  There is no central pulmonary embolism.  CT ABDOMEN AND PELVIS FINDINGS  Hepatobiliary: Hypervascular lesions in the lateral left hepatic lobe measuring 10 mm is not changed. No additional lesions.  Pancreas: Pancreas is normal. No duct dilatation. No pancreatic inflammation.   Spleen: Normal spleen  Adrenals/urinary tract: Nodule enlargement of the left adrenal gland to 2 cm is unchanged. Minimal enlargement of the medial limb of the right adrenal gland is also noted and not significantly changed. No enhancing renal lesion. No ureteral or bladder abnormality.  Stomach/Bowel: Stomach, small bowel, and colon are unremarkable. Diverticular disease of the colon.  Vascular/Lymphatic: Abdominal aorta is normal caliber. There is no retroperitoneal or periportal lymphadenopathy. No pelvic lymphadenopathy.  Reproductive: Post hysterectomy.  No adnexal abnormality.  Musculoskeletal: No aggressive osseous lesion.  Other: Within the retroperitoneum adjacent to the left psoas muscle there is a new 6 mm nodule on image 94, series 2.  IMPRESSION: Chest Impression:  1. New nodular pleural thickening along the pericardial border is consistent with recurrence of pleural metastatic lung disease. 2. New  consolidation in the superior left lung may represent radiation change but cannot exclude local recurrence.  Abdomen / Pelvis Impression:  1. New retroperitoneal nodule along the left psoas muscle is consistent with disease recurrence. This is site of prior metastasis on PET-CT scan. 2. Stable left adrenal metastasis 3. Stable hypervascular lesion within the left hepatic lobe is likely benign.   Electronically Signed   By: Suzy Bouchard M.D.   On: 07/09/2014 15:06   Dg Chest Port 1 View  07/11/2014   CLINICAL DATA:  Left-sided chest pain, shortness of breath and history of lung carcinoma.  EXAM: PORTABLE CHEST - 1 VIEW  COMPARISON:  CT of the chest on 07/09/2014  FINDINGS: Stable positioning of Port-A-Cath with the tip in the SVC. Density at the left lung base is associated with elevation of the left hemidiaphragm. This may represent atelectasis versus infiltrate. No pulmonary edema, pneumothorax or pleural fluid identified. The heart size and mediastinal contours are normal.  IMPRESSION: Left lower  lung atelectasis versus infiltrate with volume loss.   Electronically Signed   By: Aletta Edouard M.D.   On: 07/11/2014 08:54    Impression:  The patient is recovering from the effects of radiation.    Plan:  Follow-up after three-month MRI  _____________________________________  [name]. Tammi Klippel, M.D.

## 2014-08-04 NOTE — Progress Notes (Signed)
Weight and vitals stable. Continues to take decadron 4 mg bid. Reports burning sensation on her tongue x 1 week unresolved with magic mouthwash. Requesting diflucan script. Difficulty ambulating since April. Reports daily headache at her crown but, less intense. Denies diplopia, ringing in the ears, nausea or vomiting. Reports mild SOB. Reports occasional dry cough. Reports difficulty sleeping. Seen in the ED 11/13 for chest pain.

## 2014-08-07 ENCOUNTER — Telehealth: Payer: Self-pay | Admitting: Internal Medicine

## 2014-08-07 ENCOUNTER — Encounter: Payer: Self-pay | Admitting: Physician Assistant

## 2014-08-07 ENCOUNTER — Ambulatory Visit: Payer: 59

## 2014-08-07 ENCOUNTER — Other Ambulatory Visit (HOSPITAL_BASED_OUTPATIENT_CLINIC_OR_DEPARTMENT_OTHER): Payer: 59

## 2014-08-07 ENCOUNTER — Ambulatory Visit (HOSPITAL_BASED_OUTPATIENT_CLINIC_OR_DEPARTMENT_OTHER): Payer: 59 | Admitting: Physician Assistant

## 2014-08-07 VITALS — BP 116/68 | HR 84 | Temp 98.4°F | Resp 18 | Ht 66.0 in | Wt 174.8 lb

## 2014-08-07 DIAGNOSIS — C3492 Malignant neoplasm of unspecified part of left bronchus or lung: Secondary | ICD-10-CM

## 2014-08-07 DIAGNOSIS — C7931 Secondary malignant neoplasm of brain: Secondary | ICD-10-CM

## 2014-08-07 DIAGNOSIS — T451X5A Adverse effect of antineoplastic and immunosuppressive drugs, initial encounter: Secondary | ICD-10-CM

## 2014-08-07 DIAGNOSIS — C3490 Malignant neoplasm of unspecified part of unspecified bronchus or lung: Secondary | ICD-10-CM

## 2014-08-07 DIAGNOSIS — D701 Agranulocytosis secondary to cancer chemotherapy: Secondary | ICD-10-CM

## 2014-08-07 DIAGNOSIS — D709 Neutropenia, unspecified: Secondary | ICD-10-CM

## 2014-08-07 LAB — CBC WITH DIFFERENTIAL/PLATELET
BASO%: 0.5 % (ref 0.0–2.0)
Basophils Absolute: 0 10*3/uL (ref 0.0–0.1)
EOS ABS: 0 10*3/uL (ref 0.0–0.5)
EOS%: 1.6 % (ref 0.0–7.0)
HCT: 36.1 % (ref 34.8–46.6)
HGB: 11.4 g/dL — ABNORMAL LOW (ref 11.6–15.9)
LYMPH#: 0.5 10*3/uL — AB (ref 0.9–3.3)
LYMPH%: 26.5 % (ref 14.0–49.7)
MCH: 29.5 pg (ref 25.1–34.0)
MCHC: 31.6 g/dL (ref 31.5–36.0)
MCV: 93.5 fL (ref 79.5–101.0)
MONO#: 0.2 10*3/uL (ref 0.1–0.9)
MONO%: 10.6 % (ref 0.0–14.0)
NEUT%: 60.8 % (ref 38.4–76.8)
NEUTROS ABS: 1.2 10*3/uL — AB (ref 1.5–6.5)
PLATELETS: 133 10*3/uL — AB (ref 145–400)
RBC: 3.86 10*6/uL (ref 3.70–5.45)
RDW: 15.1 % — AB (ref 11.2–14.5)
WBC: 1.9 10*3/uL — ABNORMAL LOW (ref 3.9–10.3)

## 2014-08-07 LAB — COMPREHENSIVE METABOLIC PANEL (CC13)
ALBUMIN: 3.8 g/dL (ref 3.5–5.0)
ALT: 43 U/L (ref 0–55)
ANION GAP: 12 meq/L — AB (ref 3–11)
AST: 16 U/L (ref 5–34)
Alkaline Phosphatase: 46 U/L (ref 40–150)
BUN: 19.7 mg/dL (ref 7.0–26.0)
CHLORIDE: 105 meq/L (ref 98–109)
CO2: 25 meq/L (ref 22–29)
Calcium: 9.6 mg/dL (ref 8.4–10.4)
Creatinine: 0.8 mg/dL (ref 0.6–1.1)
EGFR: 82 mL/min/{1.73_m2} — AB (ref >90)
GLUCOSE: 236 mg/dL — AB (ref 70–140)
POTASSIUM: 4.4 meq/L (ref 3.5–5.1)
Sodium: 142 mEq/L (ref 136–145)
TOTAL PROTEIN: 6.5 g/dL (ref 6.4–8.3)
Total Bilirubin: 0.2 mg/dL (ref 0.20–1.20)

## 2014-08-07 MED ORDER — TBO-FILGRASTIM 480 MCG/0.8ML ~~LOC~~ SOSY
480.0000 ug | PREFILLED_SYRINGE | Freq: Once | SUBCUTANEOUS | Status: AC
  Administered 2014-08-07: 480 ug via SUBCUTANEOUS
  Filled 2014-08-07: qty 0.8

## 2014-08-07 NOTE — Patient Instructions (Addendum)

## 2014-08-07 NOTE — Progress Notes (Addendum)
[place] [phone]   Fax:([phone]) [place], [place], [address] [place] [address]  DIAGNOSIS: Lung cancer  Primary site: Lung (Left)  Staging method: AJCC 7th Edition  Clinical free text: Extensive stage small cell lung cancer  Clinical: Stage IV (T3, N2, M1b) signed by Curt Bears, MD on 12/17/2013 7:45 PM  Summary: Stage IV (T3, N2, M1b)   PRIOR THERAPY: 1. Whole brain irradiation under the care of Dr. Sondra Come  2. Systemic chemotherapy with carboplatin for an AUC of 5 given on day 1, etoposide at 120 mg per meter square given on days 1, 2 and 3 with Neulasta support given on day 4. Status post 6 cycles.   CURRENT THERAPY:  Systemic chemotherapy with cisplatin at 30 mg/m and irinotecan 65 mg meter squared given on days 1 and 8 every 3 weeks. Status post 1 cycle.  DISEASE STAGE: Lung cancer  Primary site: Lung (Left)  Staging method: AJCC 7th Edition  Clinical free text: Extensive stage small cell lung cancer  Clinical: Stage IV (T3, N2, M1b) signed by Curt Bears, MD on 12/17/2013 7:45 PM  Summary: Stage IV (T3, N2, M1b)  CHEMOTHERAPY INTENT: Palliative  CURRENT # OF CHEMOTHERAPY CYCLES: 6 CURRENT ANTIEMETICS: none  CURRENT SMOKING STATUS: Former smoker, quit July 2014  ORAL CHEMOTHERAPY AND CONSENT: n/a  CURRENT BISPHOSPHONATES USE: none  PAIN MANAGEMENT: oxycodone  NARCOTICS INDUCED CONSTIPATION: none  LIVING WILL AND CODE STATUS:    INTERVAL HISTORY: Carol Bond 58 y.o. female returns to the clinic today for followup visit accompanied by her daughter. She presents for symptom management visit prior to starting cycle #2 of her systemic chemotherapy with cisplatin and irinotecan. She states that she tolerated this chemotherapy significantly better than her previous treatment with carboplatin and etoposide with Neulasta support. She had some mild diarrhea but this has  resolved. She reports that her husband is currently at home sick with a cold. She voiced no other specific complaints today. S She denied having any significant weight loss or night sweats. She has no fever or chills. She denied having any significant shortness of breath, cough or hemoptysis.   MEDICAL HISTORY: Past Medical History  Diagnosis Date  . Pulmonary embolism   . Hypothyroid   . Radiation 12/18/13-12/31/13    whole brain 30 gray, left central chest 30 gray  . 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 Brain cancer   . Diabetes     metformin    ALLERGIES:  is allergic to actos and vicodin.  MEDICATIONS:  Current Outpatient Prescriptions  Medication Sig Dispense Refill  . albuterol (PROVENTIL HFA;VENTOLIN HFA) 108 (90 BASE) MCG/ACT inhaler Inhale 2 puffs into the lungs every 6 (six) hours as needed for wheezing or shortness of breath. 1 Inhaler 2  . Canagliflozin (INVOKANA) 300 MG TABS Take 1 tablet by mouth daily before breakfast.    . dexamethasone (DECADRON) 4 MG tablet Take 1 tablet (4 mg total) by mouth 4 (four) times daily. 40 tablet 0  . diphenhydrAMINE (SOMINEX) 25 MG tablet Take 25 mg by mouth every 6 (six) hours as needed for allergies or sleep.     Marland Kitchen escitalopram (LEXAPRO) 10 MG tablet Take 10 mg by mouth at bedtime.     Marland Kitchen esomeprazole (NEXIUM) 20 MG capsule Take 20 mg by mouth daily as needed (for acid reflex).    Marland Kitchen  fenofibrate 160 MG tablet Take 160 mg by mouth at bedtime.     . fluconazole (DIFLUCAN) 100 MG tablet Take 1 tablet (100 mg total) by mouth daily. Take 2 on first day, stop after 10 days 11 tablet 0  . furosemide (LASIX) 20 MG tablet Take 1 tablet (20 mg total) by mouth daily as needed. 30 tablet 0  . glimepiride (AMARYL) 4 MG tablet Take 4 mg by mouth daily with breakfast.    . levothyroxine (SYNTHROID, LEVOTHROID) 150 MCG tablet Take 150 mcg by mouth daily before breakfast.    . lidocaine-prilocaine (EMLA) cream Apply 1  application topically as needed. Apply to port a cath site 90 minutes prior to chemotherapy appointment. 30 g 0  . LORazepam (ATIVAN) 1 MG tablet Take 0.5-1 mg by mouth 2 (two) times daily.     Marland Kitchen losartan (COZAAR) 50 MG tablet Take 50 mg by mouth at bedtime.     Marland Kitchen oxyCODONE-acetaminophen (PERCOCET) 5-325 MG per tablet Take 1-2 tablets by mouth every 4 (four) hours as needed. 40 tablet 0  . sitaGLIPtin-metformin (JANUMET) 50-1000 MG per tablet Take 1 tablet by mouth 2 (two) times daily with a meal.    . vitamin B-12 (CYANOCOBALAMIN) 100 MCG tablet Take 100 mcg by mouth daily.    . ondansetron (ZOFRAN) 8 MG tablet Take 8 mg by mouth every 8 (eight) hours as needed.     No current facility-administered medications for this visit.    SURGICAL HISTORY:  Past Surgical History  Procedure Laterality Date  . Abdominal hysterectomy    . Spine surgery    . Cervical fusion    . Video bronchoscopy with endobronchial ultrasound N/A 12/13/2013    Procedure: VIDEO BRONCHOSCOPY WITH ENDOBRONCHIAL ULTRASOUND;  Surgeon: Grace Isaac, MD;  Location: [facility];  Service: Thoracic;  Laterality: N/A;  . Portacath placement Left 12/13/2013    Procedure: INSERTION PORT-A-CATH;  Surgeon: Grace Isaac, MD;  Location: [facility];  Service: Thoracic;  Laterality: Left;    REVIEW OF SYSTEMS:  Constitutional: positive for fatigue Eyes: negative Ears, nose, mouth, throat, and face: negative Respiratory: positive for dyspnea on exertion and pleurisy/chest pain Cardiovascular: negative Gastrointestinal: positive for diarrhea Genitourinary:negative Integument/breast: negative Hematologic/lymphatic: negative Musculoskeletal:negative Neurological: negative Behavioral/Psych: negative Endocrine: negative Allergic/Immunologic: negative   PHYSICAL EXAMINATION: General appearance: alert, cooperative, fatigued and no distress Head: Normocephalic, without obvious abnormality, atraumatic Neck: no adenopathy, no JVD,  supple, symmetrical, trachea midline and thyroid not enlarged, symmetric, no tenderness/mass/nodules Lymph nodes: Cervical, supraclavicular, and axillary nodes normal. Resp: clear to auscultation bilaterally Back: symmetric, no curvature. ROM normal. No CVA tenderness. Cardio: regular rate and rhythm, S1, S2 normal, no murmur, click, rub or gallop GI: soft, non-tender; bowel sounds normal; no masses,  no organomegaly Extremities: extremities normal, atraumatic, no cyanosis or edema Neurologic: Alert and oriented X 3, normal strength and tone. Normal symmetric reflexes. Normal coordination and gait  ECOG PERFORMANCE STATUS: 1 - Symptomatic but completely ambulatory  Blood pressure 116/68, pulse 84, temperature 98.4 F (36.9 C), temperature source Oral, resp. rate 18, height 5\' 6"  (1.676 m), weight 174 lb 12.8 oz (79.289 kg), SpO2 98 %.  LABORATORY DATA: Lab Results  Component Value Date   WBC 1.9* 08/07/2014   HGB 11.4* 08/07/2014   HCT 36.1 08/07/2014   MCV 93.5 08/07/2014   PLT 133* 08/07/2014      Chemistry      Component Value Date/Time   NA 142 08/07/2014 0839   NA 137 07/11/2014  0840   K 4.4 08/07/2014 0839   K 4.1 07/11/2014 0840   CL 97 07/11/2014 0840   CO2 25 08/07/2014 0839   CO2 24 07/11/2014 0840   BUN 19.7 08/07/2014 0839   BUN 15 07/11/2014 0840   CREATININE 0.8 08/07/2014 0839   CREATININE 0.60 07/11/2014 0840      Component Value Date/Time   CALCIUM 9.6 08/07/2014 0839   CALCIUM 10.3 07/11/2014 0840   ALKPHOS 46 08/07/2014 0839   ALKPHOS 54 12/19/2013 0320   AST 16 08/07/2014 0839   AST 24 12/19/2013 0320   ALT 43 08/07/2014 0839   ALT 48* 12/19/2013 0320   BILITOT 0.20 08/07/2014 0839   BILITOT <0.2* 12/19/2013 0320       RADIOGRAPHIC STUDIES: Ct Chest W Contrast  07/09/2014   CLINICAL DATA:  Restaging lung carcinoma diagnosed April 2015. Bone metastasis previously diagnosed. Radiation therapy and chemotherapy complete. Additional history of  brain metastasis.  EXAM: CT CHEST, ABDOMEN, AND PELVIS WITH CONTRAST  TECHNIQUE: Multidetector CT imaging of the chest, abdomen and pelvis was performed following the standard protocol during bolus administration of intravenous contrast.  CONTRAST:  173mL OMNIPAQUE IOHEXOL 300 MG/ML  SOLN  COMPARISON:  CT 05/02/2014, PET-CT 01/01/2014  FINDINGS: CT CHEST FINDINGS  No axillary supraclavicular lymphadenopathy. There is a port in the medial left chest wall. No mediastinal lymphadenopathy. No pericardial fluid.  There is nodular thickening along the pericardial surface of the upper left heart. This nodularity is increased compared to prior with a discrete nodule measuring 14 mm x 12 mm on image 29, series 2 where previously there was ill-defined 5 mm thickening. There is a second focus of nodular thickening along the pericardial surface of the left lower lobe measuring 15 mm (image 35, series 2). No lesion was seen at this site on comparison exam  There is a consolidative pattern in the superior left lung on image 19, series 4 which is increased compared to prior. This may relate radiation change. There is small effusion in the posterior left hemi thorax superiorly which is increased compared prior. Evaluation of the right lung demonstrates no nodularity.  There is no central pulmonary embolism.  CT ABDOMEN AND PELVIS FINDINGS  Hepatobiliary: Hypervascular lesions in the lateral left hepatic lobe measuring 10 mm is not changed. No additional lesions.  Pancreas: Pancreas is normal. No duct dilatation. No pancreatic inflammation.  Spleen: Normal spleen  Adrenals/urinary tract: Nodule enlargement of the left adrenal gland to 2 cm is unchanged. Minimal enlargement of the medial limb of the right adrenal gland is also noted and not significantly changed. No enhancing renal lesion. No ureteral or bladder abnormality.  Stomach/Bowel: Stomach, small bowel, and colon are unremarkable. Diverticular disease of the colon.   Vascular/Lymphatic: Abdominal aorta is normal caliber. There is no retroperitoneal or periportal lymphadenopathy. No pelvic lymphadenopathy.  Reproductive: Post hysterectomy.  No adnexal abnormality.  Musculoskeletal: No aggressive osseous lesion.  Other: Within the retroperitoneum adjacent to the left psoas muscle there is a new 6 mm nodule on image 94, series 2.  IMPRESSION: Chest Impression:  1. New nodular pleural thickening along the pericardial border is consistent with recurrence of pleural metastatic lung disease. 2. New consolidation in the superior left lung may represent radiation change but cannot exclude local recurrence.  Abdomen / Pelvis Impression:  1. New retroperitoneal nodule along the left psoas muscle is consistent with disease recurrence. This is site of prior metastasis on PET-CT scan. 2. Stable left adrenal metastasis 3.  Stable hypervascular lesion within the left hepatic lobe is likely benign.   Electronically Signed   By: Suzy Bouchard M.D.   On: 07/09/2014 15:06   Ct Angio Chest Pe W/cm &/or Wo Cm  07/11/2014   CLINICAL DATA:  Chest pain since this morning. Patient does have a history of lung cancer.  EXAM: CT ANGIOGRAPHY CHEST WITH CONTRAST  TECHNIQUE: Multidetector CT imaging of the chest was performed using the standard protocol during bolus administration of intravenous contrast. Multiplanar CT image reconstructions and MIPs were obtained to evaluate the vascular anatomy.  CONTRAST:  168mL OMNIPAQUE IOHEXOL 300 MG/ML  SOLN  COMPARISON:  Multiple prior chest CTs. The most recent is 07/09/2014  FINDINGS: Chest wall: No supraclavicular or axillary mass or adenopathy. The left-sided Port-A-Cath is stable. No obvious breast masses. The bony thorax is intact. No destructive bone lesions or spinal canal compromise.  Mediastinum: The heart is normal in size and stable. There is a small pericardial effusion. Stable epicardial masses. Stable left-sided mediastinal tumor. Stable borderline  enlarged mediastinal lymph nodes. The aorta is normal in caliber. No dissection. Stable coronary artery calcifications. The pulmonary arterial tree is fairly well opacified. No filling defects to suggest pulmonary emboli.  Lungs: Stable radiation changes involving the left upper lobe. No new/acute pulmonary findings. Small left pleural effusion. No metastatic pulmonary nodules.  Upper abdomen: Stable left adrenal gland metastasis. No liver lesions.  Review of the MIP images confirms the above findings.  IMPRESSION: No CT findings for pulmonary embolism.  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  No new/acute pulmonary findings.   Electronically Signed   By: Kalman Jewels M.D.   On: 07/11/2014 10:37   Ct Abdomen Pelvis W Contrast  07/09/2014   CLINICAL DATA:  Restaging lung carcinoma diagnosed April 2015. Bone metastasis previously diagnosed. Radiation therapy and chemotherapy complete. Additional history of brain metastasis.  EXAM: CT CHEST, ABDOMEN, AND PELVIS WITH CONTRAST  TECHNIQUE: Multidetector CT imaging of the chest, abdomen and pelvis was performed following the standard protocol during bolus administration of intravenous contrast.  CONTRAST:  151mL OMNIPAQUE IOHEXOL 300 MG/ML  SOLN  COMPARISON:  CT 05/02/2014, PET-CT 01/01/2014  FINDINGS: CT CHEST FINDINGS  No axillary supraclavicular lymphadenopathy. There is a port in the medial left chest wall. No mediastinal lymphadenopathy. No pericardial fluid.  There is nodular thickening along the pericardial surface of the upper left heart. This nodularity is increased compared to prior with a discrete nodule measuring 14 mm x 12 mm on image 29, series 2 where previously there was ill-defined 5 mm thickening. There is a second focus of nodular thickening along the pericardial surface of the left lower lobe measuring 15 mm (image 35, series 2). No lesion was seen at this site on comparison exam  There is a consolidative pattern in the superior  left lung on image 19, series 4 which is increased compared to prior. This may relate radiation change. There is small effusion in the posterior left hemi thorax superiorly which is increased compared prior. Evaluation of the right lung demonstrates no nodularity.  There is no central pulmonary embolism.  CT ABDOMEN AND PELVIS FINDINGS  Hepatobiliary: Hypervascular lesions in the lateral left hepatic lobe measuring 10 mm is not changed. No additional lesions.  Pancreas: Pancreas is normal. No duct dilatation. No pancreatic inflammation.  Spleen: Normal spleen  Adrenals/urinary tract: Nodule enlargement of the left adrenal gland to 2 cm is unchanged. Minimal enlargement of the medial limb of the  right adrenal gland is also noted and not significantly changed. No enhancing renal lesion. No ureteral or bladder abnormality.  Stomach/Bowel: Stomach, small bowel, and colon are unremarkable. Diverticular disease of the colon.  Vascular/Lymphatic: Abdominal aorta is normal caliber. There is no retroperitoneal or periportal lymphadenopathy. No pelvic lymphadenopathy.  Reproductive: Post hysterectomy.  No adnexal abnormality.  Musculoskeletal: No aggressive osseous lesion.  Other: Within the retroperitoneum adjacent to the left psoas muscle there is a new 6 mm nodule on image 94, series 2.  IMPRESSION: Chest Impression:  1. New nodular pleural thickening along the pericardial border is consistent with recurrence of pleural metastatic lung disease. 2. New consolidation in the superior left lung may represent radiation change but cannot exclude local recurrence.  Abdomen / Pelvis Impression:  1. New retroperitoneal nodule along the left psoas muscle is consistent with disease recurrence. This is site of prior metastasis on PET-CT scan. 2. Stable left adrenal metastasis 3. Stable hypervascular lesion within the left hepatic lobe is likely benign.   Electronically Signed   By: Suzy Bouchard M.D.   On: 07/09/2014 15:06   Dg  Chest Port 1 View  07/11/2014   CLINICAL DATA:  Left-sided chest pain, shortness of breath and history of lung carcinoma.  EXAM: PORTABLE CHEST - 1 VIEW  COMPARISON:  CT of the chest on 07/09/2014  FINDINGS: Stable positioning of Port-A-Cath with the tip in the SVC. Density at the left lung base is associated with elevation of the left hemidiaphragm. This may represent atelectasis versus infiltrate. No pulmonary edema, pneumothorax or pleural fluid identified. The heart size and mediastinal contours are normal.  IMPRESSION: Left lower lung atelectasis versus infiltrate with volume loss.   Electronically Signed   By: Aletta Edouard M.D.   On: 07/11/2014 08:54    ASSESSMENT AND PLAN: This is a very pleasant 58 years old white female recently diagnosed with extensive stage small cell lung cancer status post whole brain irradiation in addition to 6 cycles of systemic chemotherapy with carboplatin and etoposide. Unfortunately his recent CT scan of the chest, abdomen and pelvis showed evidence for disease recurrence with new retroperitoneal nodule along the left psoas muscle in addition to nodular pleural thickening along the pericardial border consistent with disease recurrence. She is now being treated with second line systemic chemotherapy with cisplatin 30 MG/M2 and irinotecan 65 MG/M2 on days 1 and 8 every 3 weeks. She is now status post one cycle. Overall she tolerated this first cycle relatively well with only mild diarrhea that was self-limiting. Patient was discussed with and also seen by Dr. Julien Nordmann. She is slightly neutropenic today with an ANC of 1.2. We have decided to postpone chemotherapy by 1 week and we'll treat her with Granix 480 g subcutaneously for 2 days for neutrophil support. Her chemotherapy will be rescheduled to 08/12/2014 and 08/19/2014. We will schedule a restaging CT scan of the chest, abdomen and pelvis with contrast after she completes this second cycle of second line chemotherapy  to reevaluate her disease. This is currently expected approximately 08/26/2014 and she will follow-up in 3 weeks prior to the start of cycle #3 of second line chemotherapy with cisplatin and irinotecan on 09/04/2014.Marland Kitchen  She was advised to call immediately if she has any concerning symptoms in the interval. The patient voices understanding of current disease status and treatment options and is in agreement with the current care plan.  All questions were answered. The patient knows to call the clinic with any problems, questions  or concerns. We can certainly see the patient much sooner if necessary.  Carlton Adam, PA-C 08/07/2014  ADDENDUM: Hematology/Oncology Attending: I had a face to face encounter with the patient. I recommended her care plan. This is a very pleasant 58 years old white female with extensive stage small cell lung cancer currently undergoing second line systemic chemotherapy with cisplatin and etoposide status post 1 cycle and tolerated the first cycle of her treatment fairly well. She was supposed to start cycle #2 today but her absolute neutrophil count is low. I will start the patient on treatment with Granix for the next 2 days to improve her blood count before starting the next cycle of her treatment next week. She will come back for follow-up visit in 4 weeks for reevaluation after repeating CT scan of the chest, abdomen and pelvis for restaging of her disease. She was advised to call immediately if she has any concerning symptoms in the interval.  Disclaimer: This note was dictated with voice recognition software. Similar sounding words can inadvertently be transcribed and may not be corrected upon review. Eilleen Kempf., MD 08/10/2014

## 2014-08-07 NOTE — Telephone Encounter (Signed)
Pt confirmed labs/ov per 12/10 POF, gave pt AVS..... KJ, sent msg to add chemo and radiology called sch CT's and lft msg w/pt confirming labs/CT on 12/29.... KJ

## 2014-08-08 ENCOUNTER — Ambulatory Visit (HOSPITAL_BASED_OUTPATIENT_CLINIC_OR_DEPARTMENT_OTHER): Payer: 59

## 2014-08-08 VITALS — BP 132/70 | HR 81 | Temp 98.5°F

## 2014-08-08 DIAGNOSIS — D701 Agranulocytosis secondary to cancer chemotherapy: Secondary | ICD-10-CM

## 2014-08-08 DIAGNOSIS — C3490 Malignant neoplasm of unspecified part of unspecified bronchus or lung: Secondary | ICD-10-CM

## 2014-08-08 DIAGNOSIS — D709 Neutropenia, unspecified: Secondary | ICD-10-CM

## 2014-08-08 DIAGNOSIS — T451X5A Adverse effect of antineoplastic and immunosuppressive drugs, initial encounter: Principal | ICD-10-CM

## 2014-08-08 MED ORDER — TBO-FILGRASTIM 480 MCG/0.8ML ~~LOC~~ SOSY
480.0000 ug | PREFILLED_SYRINGE | Freq: Once | SUBCUTANEOUS | Status: AC
  Administered 2014-08-08: 480 ug via SUBCUTANEOUS
  Filled 2014-08-08: qty 0.8

## 2014-08-08 NOTE — Patient Instructions (Signed)

## 2014-08-12 ENCOUNTER — Telehealth: Payer: Self-pay | Admitting: Internal Medicine

## 2014-08-12 ENCOUNTER — Other Ambulatory Visit (HOSPITAL_BASED_OUTPATIENT_CLINIC_OR_DEPARTMENT_OTHER): Payer: 59

## 2014-08-12 ENCOUNTER — Telehealth: Payer: Self-pay | Admitting: *Deleted

## 2014-08-12 ENCOUNTER — Emergency Department (HOSPITAL_COMMUNITY): Payer: 59

## 2014-08-12 ENCOUNTER — Other Ambulatory Visit: Payer: Self-pay | Admitting: Nurse Practitioner

## 2014-08-12 ENCOUNTER — Ambulatory Visit (HOSPITAL_BASED_OUTPATIENT_CLINIC_OR_DEPARTMENT_OTHER): Payer: 59 | Admitting: Nurse Practitioner

## 2014-08-12 ENCOUNTER — Encounter (HOSPITAL_COMMUNITY): Payer: Self-pay | Admitting: Emergency Medicine

## 2014-08-12 ENCOUNTER — Other Ambulatory Visit: Payer: Self-pay | Admitting: *Deleted

## 2014-08-12 ENCOUNTER — Encounter: Payer: Self-pay | Admitting: *Deleted

## 2014-08-12 ENCOUNTER — Emergency Department (HOSPITAL_COMMUNITY)
Admission: EM | Admit: 2014-08-12 | Discharge: 2014-08-12 | Disposition: A | Discharge status: Home / Self Care | Payer: 59 | Attending: Emergency Medicine | Admitting: Emergency Medicine

## 2014-08-12 ENCOUNTER — Ambulatory Visit (HOSPITAL_BASED_OUTPATIENT_CLINIC_OR_DEPARTMENT_OTHER): Payer: 59

## 2014-08-12 ENCOUNTER — Encounter: Payer: Self-pay | Admitting: Nurse Practitioner

## 2014-08-12 DIAGNOSIS — C7931 Secondary malignant neoplasm of brain: Secondary | ICD-10-CM | POA: Insufficient documentation

## 2014-08-12 DIAGNOSIS — R42 Dizziness and giddiness: Secondary | ICD-10-CM | POA: Diagnosis not present

## 2014-08-12 DIAGNOSIS — Z86711 Personal history of pulmonary embolism: Secondary | ICD-10-CM | POA: Insufficient documentation

## 2014-08-12 DIAGNOSIS — C3490 Malignant neoplasm of unspecified part of unspecified bronchus or lung: Secondary | ICD-10-CM

## 2014-08-12 DIAGNOSIS — R410 Disorientation, unspecified: Secondary | ICD-10-CM | POA: Diagnosis not present

## 2014-08-12 DIAGNOSIS — Z5112 Encounter for antineoplastic immunotherapy: Secondary | ICD-10-CM

## 2014-08-12 DIAGNOSIS — R5383 Other fatigue: Secondary | ICD-10-CM | POA: Insufficient documentation

## 2014-08-12 DIAGNOSIS — E039 Hypothyroidism, unspecified: Secondary | ICD-10-CM | POA: Insufficient documentation

## 2014-08-12 DIAGNOSIS — Z79899 Other long term (current) drug therapy: Secondary | ICD-10-CM | POA: Insufficient documentation

## 2014-08-12 DIAGNOSIS — C3412 Malignant neoplasm of upper lobe, left bronchus or lung: Secondary | ICD-10-CM

## 2014-08-12 DIAGNOSIS — Z87891 Personal history of nicotine dependence: Secondary | ICD-10-CM | POA: Diagnosis not present

## 2014-08-12 DIAGNOSIS — R51 Headache: Secondary | ICD-10-CM | POA: Diagnosis not present

## 2014-08-12 DIAGNOSIS — N390 Urinary tract infection, site not specified: Secondary | ICD-10-CM | POA: Insufficient documentation

## 2014-08-12 DIAGNOSIS — R4182 Altered mental status, unspecified: Secondary | ICD-10-CM | POA: Diagnosis present

## 2014-08-12 DIAGNOSIS — E119 Type 2 diabetes mellitus without complications: Secondary | ICD-10-CM | POA: Insufficient documentation

## 2014-08-12 DIAGNOSIS — R531 Weakness: Secondary | ICD-10-CM | POA: Insufficient documentation

## 2014-08-12 DIAGNOSIS — H5509 Other forms of nystagmus: Secondary | ICD-10-CM | POA: Insufficient documentation

## 2014-08-12 DIAGNOSIS — Z7952 Long term (current) use of systemic steroids: Secondary | ICD-10-CM | POA: Insufficient documentation

## 2014-08-12 LAB — COMPREHENSIVE METABOLIC PANEL (CC13)
ALBUMIN: 4.1 g/dL (ref 3.5–5.0)
ALT: 35 U/L (ref 0–55)
ANION GAP: 14 meq/L — AB (ref 3–11)
AST: 15 U/L (ref 5–34)
Alkaline Phosphatase: 59 U/L (ref 40–150)
BUN: 26.2 mg/dL — AB (ref 7.0–26.0)
CO2: 27 meq/L (ref 22–29)
Calcium: 9.5 mg/dL (ref 8.4–10.4)
Chloride: 102 mEq/L (ref 98–109)
Creatinine: 0.9 mg/dL (ref 0.6–1.1)
EGFR: 74 mL/min/{1.73_m2} — AB (ref >90)
GLUCOSE: 160 mg/dL — AB (ref 70–140)
POTASSIUM: 4.3 meq/L (ref 3.5–5.1)
Sodium: 143 mEq/L (ref 136–145)
Total Bilirubin: 0.23 mg/dL (ref 0.20–1.20)
Total Protein: 6.9 g/dL (ref 6.4–8.3)

## 2014-08-12 LAB — CBC WITH DIFFERENTIAL/PLATELET
BASO%: 0.8 % (ref 0.0–2.0)
BASOS ABS: 0 10*3/uL (ref 0.0–0.1)
EOS ABS: 0 10*3/uL (ref 0.0–0.5)
EOS%: 1.1 % (ref 0.0–7.0)
HCT: 39.3 % (ref 34.8–46.6)
HEMOGLOBIN: 12.4 g/dL (ref 11.6–15.9)
LYMPH%: 20.8 % (ref 14.0–49.7)
MCH: 29.5 pg (ref 25.1–34.0)
MCHC: 31.6 g/dL (ref 31.5–36.0)
MCV: 93.6 fL (ref 79.5–101.0)
MONO#: 0.6 10*3/uL (ref 0.1–0.9)
MONO%: 15.5 % — AB (ref 0.0–14.0)
NEUT#: 2.2 10*3/uL (ref 1.5–6.5)
NEUT%: 61.8 % (ref 38.4–76.8)
Platelets: 114 10*3/uL — ABNORMAL LOW (ref 145–400)
RBC: 4.2 10*6/uL (ref 3.70–5.45)
RDW: 15.9 % — AB (ref 11.2–14.5)
WBC: 3.6 10*3/uL — ABNORMAL LOW (ref 3.9–10.3)
lymph#: 0.7 10*3/uL — ABNORMAL LOW (ref 0.9–3.3)

## 2014-08-12 LAB — URINE MICROSCOPIC-ADD ON

## 2014-08-12 LAB — URINALYSIS, ROUTINE W REFLEX MICROSCOPIC
Bilirubin Urine: NEGATIVE
Glucose, UA: >1000 mg/dL — AB
Hgb urine dipstick: NEGATIVE
Ketones, ur: NEGATIVE mg/dL
Leukocytes, UA: NEGATIVE
Nitrite: POSITIVE — AB
Protein, ur: NEGATIVE mg/dL
Specific Gravity, Urine: 1.028 (ref 1.005–1.030)
Urobilinogen, UA: 0.2 mg/dL (ref 0.0–1.0)
pH: 5 (ref 5.0–8.0)

## 2014-08-12 LAB — CBG MONITORING, ED: Glucose-Capillary: 167 mg/dL — ABNORMAL HIGH (ref 70–99)

## 2014-08-12 LAB — MAGNESIUM (CC13): MAGNESIUM: 2.5 mg/dL (ref 1.5–2.5)

## 2014-08-12 MED ORDER — SODIUM CHLORIDE 0.9 % IJ SOLN
10.0000 mL | INTRAMUSCULAR | Status: DC | PRN
  Administered 2014-08-12: 10 mL via INTRAVENOUS
  Filled 2014-08-12: qty 10

## 2014-08-12 MED ORDER — CEFTRIAXONE SODIUM 1 G IJ SOLR
1.0000 g | Freq: Once | INTRAMUSCULAR | Status: AC
  Administered 2014-08-12: 1 g via INTRAVENOUS
  Filled 2014-08-12: qty 10

## 2014-08-12 MED ORDER — POTASSIUM CHLORIDE 2 MEQ/ML IV SOLN
Freq: Once | INTRAVENOUS | Status: DC
  Filled 2014-08-12: qty 10

## 2014-08-12 MED ORDER — HEPARIN SOD (PORK) LOCK FLUSH 100 UNIT/ML IV SOLN
500.0000 [IU] | Freq: Once | INTRAVENOUS | Status: AC
  Administered 2014-08-12: 500 [IU]
  Filled 2014-08-12: qty 5

## 2014-08-12 MED ORDER — GADOBENATE DIMEGLUMINE 529 MG/ML IV SOLN
15.0000 mL | Freq: Once | INTRAVENOUS | Status: AC | PRN
  Administered 2014-08-12: 15 mL via INTRAVENOUS

## 2014-08-12 MED ORDER — SODIUM CHLORIDE 0.9 % IV SOLN
Freq: Once | INTRAVENOUS | Status: AC
  Administered 2014-08-12: 10:00:00 via INTRAVENOUS

## 2014-08-12 MED ORDER — CEPHALEXIN 500 MG PO CAPS: 500.0000 mg | ORAL_CAPSULE | Freq: Two times a day (BID) | ORAL | Status: DC

## 2014-08-12 NOTE — Discharge Instructions (Signed)
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
## 2014-08-12 NOTE — Progress Notes (Signed)
Daughter's FMLA papers completed and faxed

## 2014-08-12 NOTE — ED Notes (Signed)
Patient transported to MRI 

## 2014-08-12 NOTE — ED Notes (Signed)
Patient transported to CT 

## 2014-08-12 NOTE — Progress Notes (Signed)
will   SYMPTOM MANAGEMENT CLINIC   HPI: Carol Bond 58 y.o. female diagnosed with lung cancer; with brain metastasis.  Patient is status post whole brain irradiation.  Currently undergoing cisplatin/irinotecan chemotherapy regimen.  Patient presented to the [facility] today to receive cycle 2, day 1 of her cisplatin/irinotecan chemotherapy regimen.  She states that she has been slowly tapering her dexamethasone as directed.  She was initially taken dexamethasone 4 mg 4 times per day; but is currently taking dexamethasone 2 mg twice daily.  She was further instructed to decrease the dexamethasone down to 2 mg on a daily basis for a total of one week; and then to completely discontinue the dexamethasone after that.  Also, patient received Granix 480 g subcutaneously on both the summer 10th 2015 and 08/08/2014 for neutropenia.  Patient awoke this morning with some significant neurological changes.  She states she felt fairly dizzy; with worsening dizziness when she would lie down flat.  She is also complaining of increased drowsiness today.  She is complaining of some forgetfulness as well; and appears confused regarding certain events of today.  She actually drove herself to the [facility] today; and stated that she almost fell asleep several times on the drive here.  Patient denies any vision changes.  She denies any headache.  I she had no other neuro deficit complaints whatsoever.  She denies any other new symptoms whatsoever she denies any recent fevers or chills.  HPI  CURRENT THERAPY: Upcoming Treatment Dates - LUNG SMALL CELL Cisplatin / Irinotecan D1,8 q21d Days with orders from any treatment category:  08/19/2014      SCHEDULING COMMUNICATION      CBC with Differential      Comprehensive metabolic panel      Magnesium      fosaprepitant (EMEND) 150 mg in sodium chloride 0.9 % 145 mL IVPB      palonosetron (ALOXI) injection 0.25 mg      Dexamethasone Sodium Phosphate  (DECADRON) injection 12 mg      irinotecan (CAMPTOSAR) 122 mg in dextrose 5 % 500 mL chemo infusion      CISplatin (PLATINOL) 57 mg in sodium chloride 0.9 % 250 mL chemo infusion      atropine injection 0.5 mg      sodium chloride 0.9 % injection 10 mL      heparin lock flush 100 unit/mL      heparin lock flush 100 unit/mL      alteplase (CATHFLO ACTIVASE) injection 2 mg      sodium chloride 0.9 % injection 3 mL      0.9 %  sodium chloride infusion      dextrose 5 % and 0.45% NaCl 1,000 mL with potassium chloride 20 mEq, magnesium sulfate 12 mEq infusion      TREATMENT CONDITIONS 08/26/2014      SCHEDULING COMMUNICATION      CBC with Differential      Comprehensive metabolic panel      Magnesium      fosaprepitant (EMEND) 150 mg in sodium chloride 0.9 % 145 mL IVPB      palonosetron (ALOXI) injection 0.25 mg      Dexamethasone Sodium Phosphate (DECADRON) injection 12 mg      irinotecan (CAMPTOSAR) 122 mg in dextrose 5 % 500 mL chemo infusion      CISplatin (PLATINOL) 57 mg in sodium chloride 0.9 % 250 mL chemo infusion      atropine injection 0.5 mg  sodium chloride 0.9 % injection 10 mL      heparin lock flush 100 unit/mL      heparin lock flush 100 unit/mL      alteplase (CATHFLO ACTIVASE) injection 2 mg      sodium chloride 0.9 % injection 3 mL      0.9 %  sodium chloride infusion      dextrose 5 % and 0.45% NaCl 1,000 mL with potassium chloride 20 mEq, magnesium sulfate 12 mEq infusion      TREATMENT CONDITIONS 09/09/2014      SCHEDULING COMMUNICATION      CBC with Differential      Comprehensive metabolic panel      Magnesium      fosaprepitant (EMEND) 150 mg in sodium chloride 0.9 % 145 mL IVPB      palonosetron (ALOXI) injection 0.25 mg      Dexamethasone Sodium Phosphate (DECADRON) injection 12 mg      irinotecan (CAMPTOSAR) 122 mg in dextrose 5 % 500 mL chemo infusion      CISplatin (PLATINOL) 57 mg in sodium chloride 0.9 % 250 mL chemo infusion      atropine  injection 0.5 mg      sodium chloride 0.9 % injection 10 mL      heparin lock flush 100 unit/mL      heparin lock flush 100 unit/mL      alteplase (CATHFLO ACTIVASE) injection 2 mg      sodium chloride 0.9 % injection 3 mL      0.9 %  sodium chloride infusion      dextrose 5 % and 0.45% NaCl 1,000 mL with potassium chloride 20 mEq, magnesium sulfate 12 mEq infusion      TREATMENT CONDITIONS    ROS  Past Medical History  Diagnosis Date  . Pulmonary embolism   . Hypothyroid   . Radiation 12/18/13-12/31/13    whole brain 30 gray, left central chest 30 gray  . 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 Brain cancer   . Diabetes     metformin    Past Surgical History  Procedure Laterality Date  . Abdominal hysterectomy    . Spine surgery    . Cervical fusion    . Video bronchoscopy with endobronchial ultrasound N/A 12/13/2013    Procedure: VIDEO BRONCHOSCOPY WITH ENDOBRONCHIAL ULTRASOUND;  Surgeon: Grace Isaac, MD;  Location: [facility];  Service: Thoracic;  Laterality: N/A;  . Portacath placement Left 12/13/2013    Procedure: INSERTION PORT-A-CATH;  Surgeon: Grace Isaac, MD;  Location: [facility];  Service: Thoracic;  Laterality: Left;    has Primary cancer of left upper lobe of lung; Pulmonary embolism; Hypothyroid; Diabetes; Pulmonary embolus; DOE (dyspnea on exertion); PE (pulmonary embolism); Abnormal finding on EKG - subtle inferior ST elevations; Brain metastases; Pulmonary artery thrombosis; Facial swelling; and Small cell lung cancer on her problem list.     is allergic to actos and vicodin.    Medication List       This list is accurate as of: 08/12/14 11:26 AM.  Always use your most recent med list.               albuterol 108 (90 BASE) MCG/ACT inhaler  Commonly known as:  PROVENTIL HFA;VENTOLIN HFA  Inhale 2 puffs into the lungs every 6 (six) hours as needed for wheezing or shortness of breath.     dexamethasone 4 MG tablet  Commonly known as:  DECADRON  Take 1 tablet (4 mg total) by mouth 4 (four) times daily.     diphenhydrAMINE 25 MG tablet  Commonly known as:  SOMINEX  Take 25 mg by mouth every 6 (six) hours as needed for allergies or sleep.     escitalopram 10 MG tablet  Commonly known as:  LEXAPRO  Take 10 mg by mouth at bedtime.     esomeprazole 20 MG capsule  Commonly known as:  NEXIUM  Take 20 mg by mouth daily as needed (for acid reflex).     fenofibrate 160 MG tablet  Take 160 mg by mouth at bedtime.     fluconazole 100 MG tablet  Commonly known as:  DIFLUCAN  Take 1 tablet (100 mg total) by mouth daily. Take 2 on first day, stop after 10 days     furosemide 20 MG tablet  Commonly known as:  LASIX  Take 1 tablet (20 mg total) by mouth daily as needed.     glimepiride 4 MG tablet  Commonly known as:  AMARYL  Take 4 mg by mouth daily with breakfast.     INVOKANA 300 MG Tabs tablet  Generic drug:  canagliflozin  Take 1 tablet by mouth daily before breakfast.     levothyroxine 150 MCG tablet  Commonly known as:  SYNTHROID, LEVOTHROID  Take 150 mcg by mouth daily before breakfast.     lidocaine-prilocaine cream  Commonly known as:  EMLA  Apply 1 application topically as needed. Apply to port a cath site 90 minutes prior to chemotherapy appointment.     LORazepam 1 MG tablet  Commonly known as:  ATIVAN  Take 0.5-1 mg by mouth 2 (two) times daily.     losartan 50 MG tablet  Commonly known as:  COZAAR  Take 50 mg by mouth at bedtime.     ondansetron 8 MG tablet  Commonly known as:  ZOFRAN  Take 8 mg by mouth every 8 (eight) hours as needed for nausea or vomiting.     oxyCODONE-acetaminophen 5-325 MG per tablet  Commonly known as:  PERCOCET  Take 1-2 tablets by mouth every 4 (four) hours as needed.     sitaGLIPtin-metformin 50-1000 MG per tablet  Commonly known as:  JANUMET  Take 1 tablet by mouth 2 (two) times daily with a meal.     vitamin B-12 100 MCG tablet  Commonly  known as:  CYANOCOBALAMIN  Take 100 mcg by mouth daily.         PHYSICAL EXAMINATION  Vitals: 106/67, HR 103, temp 97.0  Physical Exam  Constitutional: She is oriented to person, place, and time. Vital signs are normal. She appears unhealthy.  HENT:  Head: Normocephalic and atraumatic.  Mouth/Throat: Oropharynx is clear and moist.  Eyes: Conjunctivae and EOM are normal. Pupils are equal, round, and reactive to light. Right eye exhibits no discharge. Left eye exhibits no discharge. No scleral icterus.  Neck: Normal range of motion. Neck supple. No JVD present. No tracheal deviation present. No thyromegaly present.  Cardiovascular: Normal rate, regular rhythm, normal heart sounds and intact distal pulses.   Pulmonary/Chest: Effort normal. No respiratory distress. She has no wheezes. She has no rales. She exhibits no tenderness.  Abdominal: Soft. Bowel sounds are normal. She exhibits no distension and no mass. There is no tenderness. There is no rebound and no guarding.  Musculoskeletal: Normal range of motion. She exhibits no edema or tenderness.  Lymphadenopathy:    She has  no cervical adenopathy.  Neurological: She is oriented to person, place, and time.  Patient appeared drowsy throughout exam today.  She appeared slightly confused regarding her morning so far.  She was able to follow simple commands and answer simple questions.  No other deficits noted on exam.  Skin: Skin is warm and dry. No rash noted. No erythema.  Psychiatric: Affect normal.  Nursing note and vitals reviewed.   LABORATORY DATA:. Appointment on 08/12/2014  Component Date Value Ref Range Status  . WBC 08/12/2014 3.6* 3.9 - 10.3 10e3/uL Final  . NEUT# 08/12/2014 2.2  1.5 - 6.5 10e3/uL Final  . HGB 08/12/2014 12.4  11.6 - 15.9 g/dL Final  . HCT 08/12/2014 39.3  34.8 - 46.6 % Final  . Platelets 08/12/2014 114* 145 - 400 10e3/uL Final  . MCV 08/12/2014 93.6  79.5 - 101.0 fL Final  . MCH 08/12/2014 29.5  25.1 -  34.0 pg Final  . MCHC 08/12/2014 31.6  31.5 - 36.0 g/dL Final  . RBC 08/12/2014 4.20  3.70 - 5.45 10e6/uL Final  . RDW 08/12/2014 15.9* 11.2 - 14.5 % Final  . lymph# 08/12/2014 0.7* 0.9 - 3.3 10e3/uL Final  . MONO# 08/12/2014 0.6  0.1 - 0.9 10e3/uL Final  . Eosinophils Absolute 08/12/2014 0.0  0.0 - 0.5 10e3/uL Final  . Basophils Absolute 08/12/2014 0.0  0.0 - 0.1 10e3/uL Final  . NEUT% 08/12/2014 61.8  38.4 - 76.8 % Final  . LYMPH% 08/12/2014 20.8  14.0 - 49.7 % Final  . MONO% 08/12/2014 15.5* 0.0 - 14.0 % Final  . EOS% 08/12/2014 1.1  0.0 - 7.0 % Final  . BASO% 08/12/2014 0.8  0.0 - 2.0 % Final  . Sodium 08/12/2014 143  136 - 145 mEq/L Final  . Potassium 08/12/2014 4.3  3.5 - 5.1 mEq/L Final  . Chloride 08/12/2014 102  98 - 109 mEq/L Final  . CO2 08/12/2014 27  22 - 29 mEq/L Final  . Glucose 08/12/2014 160* 70 - 140 mg/dl Final  . BUN 08/12/2014 26.2* 7.0 - 26.0 mg/dL Final  . Creatinine 08/12/2014 0.9  0.6 - 1.1 mg/dL Final  . Total Bilirubin 08/12/2014 0.23  0.20 - 1.20 mg/dL Final  . Alkaline Phosphatase 08/12/2014 59  40 - 150 U/L Final  . AST 08/12/2014 15  5 - 34 U/L Final  . ALT 08/12/2014 35  0 - 55 U/L Final  . Total Protein 08/12/2014 6.9  6.4 - 8.3 g/dL Final  . Albumin 08/12/2014 4.1  3.5 - 5.0 g/dL Final  . Calcium 08/12/2014 9.5  8.4 - 10.4 mg/dL Final  . Anion Gap 08/12/2014 14* 3 - 11 mEq/L Final  . EGFR 08/12/2014 74* >90 ml/min/1.73 m2 Final   eGFR is calculated using the CKD-EPI Creatinine Equation (2009)  . Magnesium 08/12/2014 2.5  1.5 - 2.5 mg/dl Final     RADIOGRAPHIC STUDIES: No results found.  ASSESSMENT/PLAN:    Brain metastases Patient has been diagnosed with brain metastasis.  She has completed whole brain irradiation per Dr. Sondra Come.  She reports that she was initially taking dexamethasone 4 mg 4 times per day; but has been slowly tapering down on her dexamethasone.  Currently she is taking dexamethasone 2 mg twice daily.  She was instructed to  decrease to dexamethasone 2 mg on a daily basis 1 week; and then to completely discontinue the dexamethasone.    Patient presented to the [facility] today to receive cycle 2, day 1 of her chemotherapy regimen.  Patient reports that she woke up this morning with new onset dizziness and sleepiness.  She drove herself to the hospital; but found it difficult to stay awake while driving.  She is unclear regarding certain aspects of her drive to the [facility] today; stating that she thinks she had somebody in the car with her; but then is unclear if she actually came alone.  Neurological exam with no other deficits noted.  Will transport patient to the emergency department for further evaluation.  Brief report and history called to the emergency department charge nurse.  Advised emergency department charge nurse that patient would most likely need a brain MRI stat.  Primary cancer of left upper lobe of lung Patient presented to the [facility] today to receive cycle 2, day 1 of her cisplatin/irinotecan chemotherapy regimen.  However, patient woke up this morning with neurological changes which consisted of dizziness, increased drowsiness, and forgetfulness.  She denies any other neurological changes whatsoever.   Patient received Granix 480 g on both December 10 and 08/08/2014. Patient reports that she has decreased her dexamethasone to 2 mg twice daily.  She was instructed to continue taper of dexamethasone until she has discontinued it completely.  Decision was made to hold chemotherapy today; and transport patient to the emergency department for further evaluation of her new onset neurological deficits.  **Patient has a brain MRI scheduled for Wednesday, 08/13/2014 at 2 PM.  Have called MRI front desk back to inform them of patient's transfer to the emergency department today for further evaluation.  Was instructed to leave the brain MRI on the schedule book for now; dependent on outcome of  today's visit in the emergency department.  Will plan on rescheduling of cycle 2 of her chemotherapy for as soon as possible.  May very well have to wait until next Tuesday, 08/19/2014-depending on outcome of today's emergency department visit and diagnosis.    Patient already has a restaging CT with contrast of the chest/abdomen/pelvis scheduled for the summer 29th 2015.   Patient stated understanding of all instructions; and was in agreement with this plan of care. The patient knows to call the clinic with any problems, questions or concerns.   Review/collaboration with Dr. Julien Nordmann regarding all aspects of patient's visit today.   Total time spent with patient was 40 minutes;  with greater than 75 percent of that time spent in face to face counseling regarding her symptoms, and coordination of care and follow up.  Disclaimer: This note was dictated with voice recognition software. Similar sounding words can inadvertently be transcribed and may not be corrected upon review.   Drue Second, NP 08/12/2014

## 2014-08-12 NOTE — Assessment & Plan Note (Addendum)
Patient presented to the [facility] today to receive cycle 2, day 1 of her cisplatin/irinotecan chemotherapy regimen.  However, patient woke up this morning with neurological changes which consisted of dizziness, increased drowsiness, and forgetfulness.  She denies any other neurological changes whatsoever.   Patient received Granix 480 g on both December 10 and 08/08/2014. Patient reports that she has decreased her dexamethasone to 2 mg twice daily.  She was instructed to continue taper of dexamethasone until she has discontinued it completely.  Decision was made to hold chemotherapy today; and transport patient to the emergency department for further evaluation of her new onset neurological deficits.  **Patient has a brain MRI scheduled for Wednesday, 08/13/2014 at 2 PM.  Have called MRI front desk back to inform them of patient's transfer to the emergency department today for further evaluation.  Was instructed to leave the brain MRI on the schedule book for now; dependent on outcome of today's visit in the emergency department.  Will plan on rescheduling of cycle 2 of her chemotherapy for as soon as possible.  May very well have to wait until next Tuesday, 08/19/2014-depending on outcome of today's emergency department visit and diagnosis.    Patient already has a restaging CT with contrast of the chest/abdomen/pelvis scheduled for the summer 29th 2015.

## 2014-08-12 NOTE — Telephone Encounter (Signed)
s.w pt husband regarding to Monday adn Tuesday appt ok and aware

## 2014-08-12 NOTE — Assessment & Plan Note (Signed)
Patient has been diagnosed with brain metastasis.  She has completed whole brain irradiation per Dr. Sondra Come.  She reports that she was initially taking dexamethasone 4 mg 4 times per day; but has been slowly tapering down on her dexamethasone.  Currently she is taking dexamethasone 2 mg twice daily.  She was instructed to decrease to dexamethasone 2 mg on a daily basis 1 week; and then to completely discontinue the dexamethasone.    Patient presented to the [facility] today to receive cycle 2, day 1 of her chemotherapy regimen.  Patient reports that she woke up this morning with new onset dizziness and sleepiness.  She drove herself to the hospital; but found it difficult to stay awake while driving.  She is unclear regarding certain aspects of her drive to the [facility] today; stating that she thinks she had somebody in the car with her; but then is unclear if she actually came alone.  Neurological exam with no other deficits noted.  Will transport patient to the emergency department for further evaluation.  Brief report and history called to the emergency department charge nurse.  Advised emergency department charge nurse that patient would most likely need a brain MRI stat.

## 2014-08-12 NOTE — Progress Notes (Signed)
Patient arrived to infusion room via wheelchair stating she feels "out of it" citing lightheadedness and dizziness since waking up this morning. She also states she has not felt well this weekend but today is different. Patient appears sluggish and falling asleep during vital signs but easily arousable to voice. Patient denies changes in her daily regimen and has not taken any new medication. BP 106/67, HR 103, afebrile. Patient reports nausea without vomiting and has not medicated this morning. Patient reportedly drove herself here today, is here alone, and feels that she was "swerving all over the road." she does have family nearby who can come pick her up today. Selena Lesser, NP at chairside to assess.   OK to treat with chemotherapy per Selena Lesser, NP after discussing patient's status with Dr. Julien Nordmann.   Patient displaying confusion and increased lethargy, falling asleep while talking with RN and forgetful as to whether she came to infusion with someone today or alone. Np notified, will discuss with MD.   Plan to hold chemo and obtain MRI. Patient to ED at this time. 1116. Pt to room 13 per Cyndee Bacon,NP. RN informed patient's husband [name] of situation and to meet patient in ED at this time.  RN transported patient to rm 13 ED via wheelchair; met with RN and NT at bedside.

## 2014-08-12 NOTE — Telephone Encounter (Signed)
Called and left daughter vm message papers have been faxed

## 2014-08-12 NOTE — ED Notes (Signed)
Bed: XM46 Expected date:  Expected time:  Means of arrival:  Comments: [facility] from [facility]

## 2014-08-12 NOTE — ED Notes (Addendum)
Pt, being sent by [facility], c/o increased fatigue and memory impairment starting this morning.  Pt's decadron recently decreased from 4mg  BID to 2mg  BID.  Being sent to r/o brain edema.  Hx of lung CA w/ Brain mets.   Pt is followed by Julien Nordmann MD.

## 2014-08-12 NOTE — ED Provider Notes (Signed)
Patient care transferred to me. No AMS currently, acting normal. MRI benign, lesions are smaller, no infarct. Will treat UTI and encourage close f/u with oncology.  Ephraim Hamburger, MD 08/12/14 [phone]

## 2014-08-12 NOTE — Telephone Encounter (Signed)
s.w. pt husband and advised on 12.16 appt....ok and aware

## 2014-08-13 ENCOUNTER — Ambulatory Visit (HOSPITAL_BASED_OUTPATIENT_CLINIC_OR_DEPARTMENT_OTHER): Payer: 59

## 2014-08-13 ENCOUNTER — Other Ambulatory Visit: Payer: Self-pay | Admitting: *Deleted

## 2014-08-13 ENCOUNTER — Ambulatory Visit (HOSPITAL_COMMUNITY): Payer: 59

## 2014-08-13 DIAGNOSIS — C3412 Malignant neoplasm of upper lobe, left bronchus or lung: Secondary | ICD-10-CM

## 2014-08-13 DIAGNOSIS — Z5111 Encounter for antineoplastic chemotherapy: Secondary | ICD-10-CM

## 2014-08-13 MED ORDER — ATROPINE SULFATE 1 MG/ML IJ SOLN
0.5000 mg | Freq: Once | INTRAMUSCULAR | Status: AC | PRN
  Administered 2014-08-13: 0.5 mg via INTRAVENOUS

## 2014-08-13 MED ORDER — HEPARIN SOD (PORK) LOCK FLUSH 100 UNIT/ML IV SOLN
500.0000 [IU] | Freq: Once | INTRAVENOUS | Status: AC | PRN
  Administered 2014-08-13: 500 [IU]
  Filled 2014-08-13: qty 5

## 2014-08-13 MED ORDER — SODIUM CHLORIDE 0.9 % IV SOLN
Freq: Once | INTRAVENOUS | Status: AC
  Administered 2014-08-13: 09:00:00 via INTRAVENOUS

## 2014-08-13 MED ORDER — CISPLATIN CHEMO INJECTION 100MG/100ML
30.0000 mg/m2 | Freq: Once | INTRAVENOUS | Status: AC
  Administered 2014-08-13: 57 mg via INTRAVENOUS
  Filled 2014-08-13: qty 57

## 2014-08-13 MED ORDER — PALONOSETRON HCL INJECTION 0.25 MG/5ML
INTRAVENOUS | Status: AC
  Filled 2014-08-13: qty 5

## 2014-08-13 MED ORDER — ATROPINE SULFATE 1 MG/ML IJ SOLN
INTRAMUSCULAR | Status: AC
  Filled 2014-08-13: qty 1

## 2014-08-13 MED ORDER — FOSAPREPITANT DIMEGLUMINE INJECTION 150 MG
150.0000 mg | Freq: Once | INTRAVENOUS | Status: AC
  Administered 2014-08-13: 150 mg via INTRAVENOUS
  Filled 2014-08-13: qty 5

## 2014-08-13 MED ORDER — DEXAMETHASONE SODIUM PHOSPHATE 20 MG/5ML IJ SOLN
12.0000 mg | Freq: Once | INTRAMUSCULAR | Status: AC
  Administered 2014-08-13: 12 mg via INTRAVENOUS

## 2014-08-13 MED ORDER — PALONOSETRON HCL INJECTION 0.25 MG/5ML
0.2500 mg | Freq: Once | INTRAVENOUS | Status: AC
  Administered 2014-08-13: 0.25 mg via INTRAVENOUS

## 2014-08-13 MED ORDER — IRINOTECAN HCL CHEMO INJECTION 100 MG/5ML
65.0000 mg/m2 | Freq: Once | INTRAVENOUS | Status: AC
  Administered 2014-08-13: 122 mg via INTRAVENOUS
  Filled 2014-08-13: qty 6.1

## 2014-08-13 MED ORDER — POTASSIUM CHLORIDE 2 MEQ/ML IV SOLN
Freq: Once | INTRAVENOUS | Status: AC
  Administered 2014-08-13: 10:00:00 via INTRAVENOUS
  Filled 2014-08-13: qty 10

## 2014-08-13 MED ORDER — DEXAMETHASONE SODIUM PHOSPHATE 20 MG/5ML IJ SOLN
INTRAMUSCULAR | Status: AC
  Filled 2014-08-13: qty 5

## 2014-08-13 MED ORDER — SODIUM CHLORIDE 0.9 % IJ SOLN
10.0000 mL | INTRAMUSCULAR | Status: DC | PRN
  Administered 2014-08-13: 10 mL
  Filled 2014-08-13: qty 10

## 2014-08-13 NOTE — Patient Instructions (Signed)
[facility] Discharge Instructions for Patients Receiving Chemotherapy  Today you received the following chemotherapy agents Cisplatin/Irinotecan.  To help prevent nausea and vomiting after your treatment, we encourage you to take your nausea medication as prescribed.   If you develop nausea and vomiting that is not controlled by your nausea medication, call the clinic.   BELOW ARE SYMPTOMS THAT SHOULD BE REPORTED IMMEDIATELY:  *FEVER GREATER THAN 100.5 F  *CHILLS WITH OR WITHOUT FEVER  NAUSEA AND VOMITING THAT IS NOT CONTROLLED WITH YOUR NAUSEA MEDICATION  *UNUSUAL SHORTNESS OF BREATH  *UNUSUAL BRUISING OR BLEEDING  TENDERNESS IN MOUTH AND THROAT WITH OR WITHOUT PRESENCE OF ULCERS  *URINARY PROBLEMS  *BOWEL PROBLEMS  UNUSUAL RASH Items with * indicate a potential emergency and should be followed up as soon as possible.  Feel free to call the clinic you have any questions or concerns. The clinic phone number is [phone].

## 2014-08-14 ENCOUNTER — Ambulatory Visit: Payer: 59

## 2014-08-14 ENCOUNTER — Other Ambulatory Visit: Payer: 59

## 2014-08-15 LAB — URINE CULTURE: Colony Count: >=100000

## 2014-08-17 ENCOUNTER — Telehealth (HOSPITAL_COMMUNITY): Payer: Self-pay

## 2014-08-17 NOTE — Telephone Encounter (Signed)
Post ED Visit - Positive Culture Follow-up  Culture report reviewed by antimicrobial stewardship pharmacist: [x]  Sundra Aland, Pharm.D., BCPS []  Heide Guile, Pharm.D., BCPS []  Alycia Rossetti, Pharm.D., BCPS []  [place], [place].D., BCPS, AAHIVP []  Legrand Como, Pharm.D., BCPS, AAHIVP []  Isac Sarna, Pharm.D., BCPS  Positive Urine culture, >/= 100,000 colonies -> Klebsiella Pneumoniae Treated with Cephalexin, organism sensitive to the same and no further patient follow-up is required at this time.  Dortha Kern 08/17/2014, 9:32 PM

## 2014-08-18 ENCOUNTER — Ambulatory Visit (HOSPITAL_BASED_OUTPATIENT_CLINIC_OR_DEPARTMENT_OTHER): Payer: 59 | Admitting: Lab

## 2014-08-18 ENCOUNTER — Encounter: Payer: Self-pay | Admitting: Physician Assistant

## 2014-08-18 ENCOUNTER — Ambulatory Visit (HOSPITAL_BASED_OUTPATIENT_CLINIC_OR_DEPARTMENT_OTHER): Payer: 59 | Admitting: Physician Assistant

## 2014-08-18 VITALS — BP 127/77 | HR 82 | Temp 98.0°F | Resp 18 | Ht 66.0 in | Wt 177.3 lb

## 2014-08-18 DIAGNOSIS — C3492 Malignant neoplasm of unspecified part of left bronchus or lung: Secondary | ICD-10-CM

## 2014-08-18 DIAGNOSIS — C3490 Malignant neoplasm of unspecified part of unspecified bronchus or lung: Secondary | ICD-10-CM

## 2014-08-18 DIAGNOSIS — C786 Secondary malignant neoplasm of retroperitoneum and peritoneum: Secondary | ICD-10-CM

## 2014-08-18 DIAGNOSIS — C3412 Malignant neoplasm of upper lobe, left bronchus or lung: Secondary | ICD-10-CM

## 2014-08-18 DIAGNOSIS — C779 Secondary and unspecified malignant neoplasm of lymph node, unspecified: Secondary | ICD-10-CM

## 2014-08-18 DIAGNOSIS — C7931 Secondary malignant neoplasm of brain: Secondary | ICD-10-CM

## 2014-08-18 LAB — COMPREHENSIVE METABOLIC PANEL (CC13)
ALBUMIN: 3.9 g/dL (ref 3.5–5.0)
ALT: 39 U/L (ref 0–55)
ANION GAP: 10 meq/L (ref 3–11)
AST: 15 U/L (ref 5–34)
Alkaline Phosphatase: 48 U/L (ref 40–150)
BUN: 18.6 mg/dL (ref 7.0–26.0)
CO2: 30 meq/L — AB (ref 22–29)
Calcium: 9.5 mg/dL (ref 8.4–10.4)
Chloride: 102 mEq/L (ref 98–109)
Creatinine: 0.7 mg/dL (ref 0.6–1.1)
EGFR: >90 mL/min/{1.73_m2} (ref >90)
Glucose: 80 mg/dl (ref 70–140)
Potassium: 4.4 mEq/L (ref 3.5–5.1)
SODIUM: 142 meq/L (ref 136–145)
Total Bilirubin: 0.22 mg/dL (ref 0.20–1.20)
Total Protein: 6.6 g/dL (ref 6.4–8.3)

## 2014-08-18 LAB — CBC WITH DIFFERENTIAL/PLATELET
BASO%: 0.5 % (ref 0.0–2.0)
Basophils Absolute: 0 10*3/uL (ref 0.0–0.1)
EOS ABS: 0 10*3/uL (ref 0.0–0.5)
EOS%: 0.9 % (ref 0.0–7.0)
HCT: 36.4 % (ref 34.8–46.6)
HGB: 11.8 g/dL (ref 11.6–15.9)
LYMPH%: 33.5 % (ref 14.0–49.7)
MCH: 29.6 pg (ref 25.1–34.0)
MCHC: 32.4 g/dL (ref 31.5–36.0)
MCV: 91.5 fL (ref 79.5–101.0)
MONO#: 0.1 10*3/uL (ref 0.1–0.9)
MONO%: 6 % (ref 0.0–14.0)
NEUT#: 1.3 10*3/uL — ABNORMAL LOW (ref 1.5–6.5)
NEUT%: 59.1 % (ref 38.4–76.8)
Platelets: 106 10*3/uL — ABNORMAL LOW (ref 145–400)
RBC: 3.98 10*6/uL (ref 3.70–5.45)
RDW: 15.2 % — ABNORMAL HIGH (ref 11.2–14.5)
WBC: 2.2 10*3/uL — ABNORMAL LOW (ref 3.9–10.3)
lymph#: 0.7 10*3/uL — ABNORMAL LOW (ref 0.9–3.3)

## 2014-08-18 NOTE — Progress Notes (Addendum)
[place] [phone]   Fax:(336) [place], MD [place], [address] [place] [address]  DIAGNOSIS: Lung cancer  Primary site: Lung (Left)  Staging method: AJCC 7th Edition  Clinical free text: Extensive stage small cell lung cancer  Clinical: Stage IV (T3, N2, M1b) signed by Curt Bears, MD on 12/17/2013 7:45 PM  Summary: Stage IV (T3, N2, M1b)   PRIOR THERAPY: 1. Whole brain irradiation under the care of Dr. Sondra Come  2. Systemic chemotherapy with carboplatin for an AUC of 5 given on day 1, etoposide at 120 mg per meter square given on days 1, 2 and 3 with Neulasta support given on day 4. Status post 6 cycles.   CURRENT THERAPY:  Systemic chemotherapy with cisplatin at 30 mg/m and irinotecan 65 mg meter squared given on days 1 and 8 every 3 weeks. Status post 1 cycle.  DISEASE STAGE: Lung cancer  Primary site: Lung (Left)  Staging method: AJCC 7th Edition  Clinical free text: Extensive stage small cell lung cancer  Clinical: Stage IV (T3, N2, M1b) signed by Curt Bears, MD on 12/17/2013 7:45 PM  Summary: Stage IV (T3, N2, M1b)  CHEMOTHERAPY INTENT: Palliative  CURRENT # OF CHEMOTHERAPY CYCLES: 6 CURRENT ANTIEMETICS: none  CURRENT SMOKING STATUS: Former smoker, quit July 2014  ORAL CHEMOTHERAPY AND CONSENT: n/a  CURRENT BISPHOSPHONATES USE: none  PAIN MANAGEMENT: oxycodone  NARCOTICS INDUCED CONSTIPATION: none  LIVING WILL AND CODE STATUS:    INTERVAL HISTORY: [name] 58 y.o. female returns to the clinic today for followup visit accompanied by her daughter. She presents for symptom management visit prior to day 8 of cycle #2 of her systemic chemotherapy with cisplatin and irinotecan. She states that she tolerated this chemotherapy significantly better than her previous treatment with carboplatin and etoposide with Neulasta support. She had a episode of significant mental  status change and was found to have a Klebsiella urinary tract infection. She was evaluated in the emergency room and placed on a course of Keflex. She reports she has approximately 2 or 3 days remaining on that antibiotic course. She feels significantly better and has had no further changes in her mental status. She denies any urinary symptomatology today. She voiced no other specific complaints today. She denied having any significant weight loss or night sweats. She has no fever or chills. She denied having any significant shortness of breath, cough or hemoptysis.   MEDICAL HISTORY: Past Medical History  Diagnosis Date  . Pulmonary embolism   . Hypothyroid   . Radiation 12/18/13-12/31/13    whole brain 30 gray, left central chest 30 gray  . 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 Brain cancer   . Diabetes     metformin    ALLERGIES:  is allergic to actos and vicodin.  MEDICATIONS:  Current Outpatient Prescriptions  Medication Sig Dispense Refill  . albuterol (PROVENTIL HFA;VENTOLIN HFA) 108 (90 BASE) MCG/ACT inhaler Inhale 2 puffs into the lungs every 6 (six) hours as needed for wheezing or shortness of breath. 1 Inhaler 2  . Canagliflozin (INVOKANA) 300 MG TABS Take 1 tablet by mouth daily before breakfast.    . cephALEXin (KEFLEX) 500 MG capsule Take 1 capsule (500 mg total) by mouth 2 (two) times daily. 14 capsule 0  . diphenhydrAMINE (SOMINEX) 25 MG tablet Take 25 mg by mouth every 6 (  six) hours as needed for allergies or sleep.     Marland Kitchen escitalopram (LEXAPRO) 10 MG tablet Take 10 mg by mouth at bedtime.     Marland Kitchen esomeprazole (NEXIUM) 20 MG capsule Take 20 mg by mouth daily as needed (for acid reflex).    . fenofibrate 160 MG tablet Take 160 mg by mouth at bedtime.     . fluconazole (DIFLUCAN) 100 MG tablet Take 1 tablet (100 mg total) by mouth daily. Take 2 on first day, stop after 10 days 11 tablet 0  . furosemide (LASIX) 20 MG tablet Take 1 tablet (20 mg  total) by mouth daily as needed. 30 tablet 0  . glimepiride (AMARYL) 4 MG tablet Take 4 mg by mouth daily with breakfast.    . levothyroxine (SYNTHROID, LEVOTHROID) 150 MCG tablet Take 150 mcg by mouth daily before breakfast.    . lidocaine-prilocaine (EMLA) cream Apply 1 application topically as needed. Apply to port a cath site 90 minutes prior to chemotherapy appointment. 30 g 0  . LORazepam (ATIVAN) 1 MG tablet Take 0.5-1 mg by mouth 2 (two) times daily.     Marland Kitchen losartan (COZAAR) 50 MG tablet Take 50 mg by mouth at bedtime.     . ondansetron (ZOFRAN) 8 MG tablet Take 8 mg by mouth every 8 (eight) hours as needed for nausea or vomiting.     Marland Kitchen oxyCODONE-acetaminophen (PERCOCET) 5-325 MG per tablet Take 1-2 tablets by mouth every 4 (four) hours as needed. 40 tablet 0  . sitaGLIPtin-metformin (JANUMET) 50-1000 MG per tablet Take 1 tablet by mouth 2 (two) times daily with a meal.    . vitamin B-12 (CYANOCOBALAMIN) 100 MCG tablet Take 100 mcg by mouth daily.    Marland Kitchen dexamethasone (DECADRON) 4 MG tablet Take 1 tablet (4 mg total) by mouth 4 (four) times daily. (Patient not taking: Reported on 08/18/2014) 40 tablet 0   No current facility-administered medications for this visit.    SURGICAL HISTORY:  Past Surgical History  Procedure Laterality Date  . Abdominal hysterectomy    . Spine surgery    . Cervical fusion    . Video bronchoscopy with endobronchial ultrasound N/A 12/13/2013    Procedure: VIDEO BRONCHOSCOPY WITH ENDOBRONCHIAL ULTRASOUND;  Surgeon: Grace Isaac, MD;  Location: [facility];  Service: Thoracic;  Laterality: N/A;  . Portacath placement Left 12/13/2013    Procedure: INSERTION PORT-A-CATH;  Surgeon: Grace Isaac, MD;  Location: [facility];  Service: Thoracic;  Laterality: Left;    REVIEW OF SYSTEMS:  Constitutional: positive for fatigue Eyes: negative Ears, nose, mouth, throat, and face: negative Respiratory: positive for dyspnea on exertion and pleurisy/chest pain Cardiovascular:  negative Gastrointestinal: positive for diarrhea Genitourinary:negative Integument/breast: negative Hematologic/lymphatic: negative Musculoskeletal:negative Neurological: negative Behavioral/Psych: negative Endocrine: negative Allergic/Immunologic: negative   PHYSICAL EXAMINATION: General appearance: alert, cooperative, fatigued and no distress Head: Normocephalic, without obvious abnormality, atraumatic Neck: no adenopathy, no JVD, supple, symmetrical, trachea midline and thyroid not enlarged, symmetric, no tenderness/mass/nodules Lymph nodes: Cervical, supraclavicular, and axillary nodes normal. Resp: clear to auscultation bilaterally Back: symmetric, no curvature. ROM normal. No CVA tenderness. Cardio: regular rate and rhythm, S1, S2 normal, no murmur, click, rub or gallop GI: soft, non-tender; bowel sounds normal; no masses,  no organomegaly Extremities: extremities normal, atraumatic, no cyanosis or edema Neurologic: Alert and oriented X 3, normal strength and tone. Normal symmetric reflexes. Normal coordination and gait  ECOG PERFORMANCE STATUS: 1 - Symptomatic but completely ambulatory  Blood pressure 127/77, pulse 82, temperature 98  F (36.7 C), temperature source Oral, resp. rate 18, height 5\' 6"  (1.676 m), weight 177 lb 4.8 oz (80.423 kg), SpO2 99 %.  LABORATORY DATA: Lab Results  Component Value Date   WBC 2.2* 08/18/2014   HGB 11.8 08/18/2014   HCT 36.4 08/18/2014   MCV 91.5 08/18/2014   PLT 106* 08/18/2014      Chemistry      Component Value Date/Time   NA 142 08/18/2014 1425   NA 137 07/11/2014 0840   K 4.4 08/18/2014 1425   K 4.1 07/11/2014 0840   CL 97 07/11/2014 0840   CO2 30* 08/18/2014 1425   CO2 24 07/11/2014 0840   BUN 18.6 08/18/2014 1425   BUN 15 07/11/2014 0840   CREATININE 0.7 08/18/2014 1425   CREATININE 0.60 07/11/2014 0840      Component Value Date/Time   CALCIUM 9.5 08/18/2014 1425   CALCIUM 10.3 07/11/2014 0840   ALKPHOS 48  08/18/2014 1425   ALKPHOS 54 12/19/2013 0320   AST 15 08/18/2014 1425   AST 24 12/19/2013 0320   ALT 39 08/18/2014 1425   ALT 48* 12/19/2013 0320   BILITOT 0.22 08/18/2014 1425   BILITOT <0.2* 12/19/2013 0320       RADIOGRAPHIC STUDIES: Ct Head Wo Contrast  08/12/2014   CLINICAL DATA:  Dizziness. Drowsiness. Forgetfulness. Metastatic lung cancer to the brain.  EXAM: CT HEAD WITHOUT CONTRAST  TECHNIQUE: Contiguous axial images were obtained from the base of the skull through the vertex without intravenous contrast.  COMPARISON:  06/18/2014  FINDINGS: The patient has known metastatic lesions anteriorly in the right temporal lobe, along the right caudate head, in both upper parietal lobes, and along the left posterior frontal lobe. These are essentially an apparent on today' s CT ; IV contrast was not administered and MRI does enjoy greater sensitivity for such small lesions compared to CT.  Periventricular white matter and corona radiata hypodensities favor chronic ischemic microvascular white matter disease. Faint calcifications in the globus pallidus nuclei bilaterally, likely physiologic.  Otherwise, the brainstem, cerebellum, cerebral peduncles, thalamus, basal ganglia, basilar cisterns, and ventricular system appear within normal limits. No intracranial hemorrhage, mass lesion, or acute CVA.  IMPRESSION: 1. The previously visualized small metastatic lesions scattered in the brain on prior MRI are not readily apparent on today's noncontrast CT scan. This does not necessarily indicate that they have changed or improved given the considerably higher sensitivity of contrast-enhanced MRI for such lesions. I do not see any new lesions today. No acute intracranial findings. 2. Periventricular white matter and corona radiata hypodensities favor chronic ischemic microvascular white matter disease.   Electronically Signed   By: Sherryl Barters M.D.   On: 08/12/2014 13:10   Mr Jeri Cos YW  Contrast  08/12/2014   CLINICAL DATA:  Dizziness. Altered mental status. Metastatic lung cancer.  EXAM: MRI HEAD WITHOUT AND WITH CONTRAST  TECHNIQUE: Multiplanar, multiecho pulse sequences of the brain and surrounding structures were obtained without and with intravenous contrast.  CONTRAST:  11mL MULTIHANCE GADOBENATE DIMEGLUMINE 529 MG/ML IV SOLN  COMPARISON:  CT head 08/12/2014.  MRI 06/18/2014  FINDINGS: The ventricle size is normal. Hypodensity throughout the cerebral white matter bilaterally is stable and likely related to chemo and radiation affect. Hyperintensity in the pons also unchanged and may be related to above or chronic microvascular ischemic change.  Negative for acute infarct.  Multiple enhancing metastatic deposits were identified on the prior study which show interval improvement. Today study is degraded by  motion however the lesions appear improved.  Right lateral inferior temporal lobe lesion no longer visualized.  2 mm enhancing lesion right caudate head unchanged  Superficial enhancing lesion in the left posterior frontal lobe measuring approximately 3 x 4 mm unchanged. This is adjacent to a vessel.  Cluster of enhancing lesions in the left occipital parietal lobe have nearly completely resolved. Small area of enhancement is present in this area measuring approximately 3 mm.  Enhancing lesion in the high right parietal lobe has resolved. Small amount of associated hemorrhage again noted.  No new metastatic deposits are identified. No mass effect or midline shift.  IMPRESSION: Negative for acute infarct.  Multiple enhancing metastatic deposits with overall improvement since 06/18/2014. No new lesions  Extensive signal change throughout the cerebral white matter and brainstem likely related to chemo and radiation.   Electronically Signed   By: Franchot Gallo M.D.   On: 08/12/2014 18:10    ASSESSMENT AND PLAN: This is a very pleasant 58 years old white female recently diagnosed with  extensive stage small cell lung cancer status post whole brain irradiation in addition to 6 cycles of systemic chemotherapy with carboplatin and etoposide. Unfortunately his recent CT scan of the chest, abdomen and pelvis showed evidence for disease recurrence with new retroperitoneal nodule along the left psoas muscle in addition to nodular pleural thickening along the pericardial border consistent with disease recurrence. She is now being treated with second line systemic chemotherapy with cisplatin 30 MG/M2 and irinotecan 65 MG/M2 on days 1 and 8 every 3 weeks. She is now status post one cycle. Overall she tolerated this first cycle relatively well with only mild diarrhea that was self-limiting. Patient was discussed with and also seen by Dr. Julien Nordmann. She is slightly neutropenic today with an ANC of 1.3. She'll proceed with day 8 of cycle #2 as scheduled. She will follow-up with Dr. Julien Nordmann as previously scheduled in 3 weeks with restaging CT scan of the chest, abdomen and pelvis with contrast to reevaluate her disease. She is encouraged to complete her course of Keflex to completely treat her urinary tract infection. She may require a follow-up urine culture to be sure she has completely cleared this infection.   She was advised to call immediately if she has any concerning symptoms in the interval. The patient voices understanding of current disease status and treatment options and is in agreement with the current care plan.  All questions were answered. The patient knows to call the clinic with any problems, questions or concerns. We can certainly see the patient much sooner if necessary.  Carlton Adam, PA-C 08/18/2014  ADDENDUM: Hematology/Oncology Attending: I had a face to face encounter with the patient today. I recommended her care plan. She is a very pleasant 58 years old white female with extensive stage small cell lung cancer who was currently undergoing second line chemotherapy with  reduced dose cisplatin and irinotecan status post 1 cycle and she is currently here today for evaluation before day 8 of the second cycle. She is tolerating her treatment fairly well with no significant adverse effects. She has no nausea or vomiting, no fever or chills. She was recently treated with Keflex for urinary tract infection and has improvement in her symptoms. I recommended for the patient to proceed with the chemotherapy as scheduled. She would come back for follow-up visit in 2 weeks after repeating CT scan of the chest, abdomen and pelvis for restaging of her disease. She was advised to call  immediately if she has any concerning symptoms in the interval.  Disclaimer: This note was dictated with voice recognition software. Similar sounding words can inadvertently be transcribed and may be missed upon review. Eilleen Kempf., MD 08/18/2014

## 2014-08-18 NOTE — Patient Instructions (Signed)
Complete your course of Keflex as prescribed Continue labs and chemotherapy as scheduled Follow-up in 3 weeks with a restaging CT scan of your chest, abdomen and pelvis to reevaluate your disease, prior to next scheduled cycle of chemotherapy.

## 2014-08-19 ENCOUNTER — Ambulatory Visit (HOSPITAL_BASED_OUTPATIENT_CLINIC_OR_DEPARTMENT_OTHER): Payer: 59

## 2014-08-19 ENCOUNTER — Other Ambulatory Visit: Payer: 59

## 2014-08-19 DIAGNOSIS — Z5111 Encounter for antineoplastic chemotherapy: Secondary | ICD-10-CM

## 2014-08-19 DIAGNOSIS — C7931 Secondary malignant neoplasm of brain: Secondary | ICD-10-CM

## 2014-08-19 DIAGNOSIS — C3412 Malignant neoplasm of upper lobe, left bronchus or lung: Secondary | ICD-10-CM

## 2014-08-19 MED ORDER — HEPARIN SOD (PORK) LOCK FLUSH 100 UNIT/ML IV SOLN
500.0000 [IU] | Freq: Once | INTRAVENOUS | Status: AC | PRN
  Administered 2014-08-19: 500 [IU]
  Filled 2014-08-19: qty 5

## 2014-08-19 MED ORDER — DEXAMETHASONE SODIUM PHOSPHATE 20 MG/5ML IJ SOLN
12.0000 mg | Freq: Once | INTRAMUSCULAR | Status: AC
  Administered 2014-08-19: 12 mg via INTRAVENOUS

## 2014-08-19 MED ORDER — PALONOSETRON HCL INJECTION 0.25 MG/5ML
0.2500 mg | Freq: Once | INTRAVENOUS | Status: AC
  Administered 2014-08-19: 0.25 mg via INTRAVENOUS

## 2014-08-19 MED ORDER — IRINOTECAN HCL CHEMO INJECTION 100 MG/5ML
65.0000 mg/m2 | Freq: Once | INTRAVENOUS | Status: AC
  Administered 2014-08-19: 122 mg via INTRAVENOUS
  Filled 2014-08-19: qty 6.1

## 2014-08-19 MED ORDER — SODIUM CHLORIDE 0.9 % IV SOLN
Freq: Once | INTRAVENOUS | Status: AC
  Administered 2014-08-19: 09:00:00 via INTRAVENOUS

## 2014-08-19 MED ORDER — DEXAMETHASONE SODIUM PHOSPHATE 20 MG/5ML IJ SOLN
INTRAMUSCULAR | Status: AC
  Filled 2014-08-19: qty 5

## 2014-08-19 MED ORDER — ATROPINE SULFATE 1 MG/ML IJ SOLN
INTRAMUSCULAR | Status: AC
  Filled 2014-08-19: qty 1

## 2014-08-19 MED ORDER — SODIUM CHLORIDE 0.9 % IV SOLN
30.0000 mg/m2 | Freq: Once | INTRAVENOUS | Status: AC
  Administered 2014-08-19: 57 mg via INTRAVENOUS
  Filled 2014-08-19: qty 57

## 2014-08-19 MED ORDER — POTASSIUM CHLORIDE 2 MEQ/ML IV SOLN
Freq: Once | INTRAVENOUS | Status: AC
  Administered 2014-08-19: 09:00:00 via INTRAVENOUS
  Filled 2014-08-19: qty 10

## 2014-08-19 MED ORDER — SODIUM CHLORIDE 0.9 % IV SOLN
150.0000 mg | Freq: Once | INTRAVENOUS | Status: AC
  Administered 2014-08-19: 150 mg via INTRAVENOUS
  Filled 2014-08-19: qty 5

## 2014-08-19 MED ORDER — PALONOSETRON HCL INJECTION 0.25 MG/5ML
INTRAVENOUS | Status: AC
  Filled 2014-08-19: qty 5

## 2014-08-19 MED ORDER — SODIUM CHLORIDE 0.9 % IJ SOLN
10.0000 mL | INTRAMUSCULAR | Status: DC | PRN
  Administered 2014-08-19: 10 mL
  Filled 2014-08-19: qty 10

## 2014-08-19 MED ORDER — ATROPINE SULFATE 1 MG/ML IJ SOLN
0.5000 mg | Freq: Once | INTRAMUSCULAR | Status: AC | PRN
  Administered 2014-08-19: 0.5 mg via INTRAVENOUS

## 2014-08-19 NOTE — Progress Notes (Signed)
OK to treat patient today with anc of 1.3 per note from visit with Awilda Metro NP yesterday.

## 2014-08-19 NOTE — Patient Instructions (Signed)
[facility] Discharge Instructions for Patients Receiving Chemotherapy  Today you received the following chemotherapy agents Cisplatin/Irinotecan.  To help prevent nausea and vomiting after your treatment, we encourage you to take your nausea medication as prescribed.   If you develop nausea and vomiting that is not controlled by your nausea medication, call the clinic.   BELOW ARE SYMPTOMS THAT SHOULD BE REPORTED IMMEDIATELY:  *FEVER GREATER THAN 100.5 F  *CHILLS WITH OR WITHOUT FEVER  NAUSEA AND VOMITING THAT IS NOT CONTROLLED WITH YOUR NAUSEA MEDICATION  *UNUSUAL SHORTNESS OF BREATH  *UNUSUAL BRUISING OR BLEEDING  TENDERNESS IN MOUTH AND THROAT WITH OR WITHOUT PRESENCE OF ULCERS  *URINARY PROBLEMS  *BOWEL PROBLEMS  UNUSUAL RASH Items with * indicate a potential emergency and should be followed up as soon as possible.  Feel free to call the clinic you have any questions or concerns. The clinic phone number is [phone].

## 2014-08-25 ENCOUNTER — Other Ambulatory Visit: Payer: Self-pay | Admitting: Nurse Practitioner

## 2014-08-26 ENCOUNTER — Telehealth: Payer: Self-pay | Admitting: *Deleted

## 2014-08-26 ENCOUNTER — Encounter (HOSPITAL_COMMUNITY): Payer: Self-pay

## 2014-08-26 ENCOUNTER — Ambulatory Visit (HOSPITAL_COMMUNITY)
Admission: RE | Admit: 2014-08-26 | Discharge: 2014-08-26 | Disposition: A | Discharge status: Home / Self Care | Payer: 59 | Source: Ambulatory Visit | Attending: Physician Assistant | Admitting: Physician Assistant

## 2014-08-26 ENCOUNTER — Other Ambulatory Visit (HOSPITAL_BASED_OUTPATIENT_CLINIC_OR_DEPARTMENT_OTHER): Payer: 59

## 2014-08-26 DIAGNOSIS — C3412 Malignant neoplasm of upper lobe, left bronchus or lung: Secondary | ICD-10-CM

## 2014-08-26 DIAGNOSIS — I700 Atherosclerosis of aorta: Secondary | ICD-10-CM | POA: Insufficient documentation

## 2014-08-26 DIAGNOSIS — D1803 Hemangioma of intra-abdominal structures: Secondary | ICD-10-CM | POA: Insufficient documentation

## 2014-08-26 DIAGNOSIS — Z90710 Acquired absence of both cervix and uterus: Secondary | ICD-10-CM | POA: Insufficient documentation

## 2014-08-26 DIAGNOSIS — C7972 Secondary malignant neoplasm of left adrenal gland: Secondary | ICD-10-CM | POA: Diagnosis not present

## 2014-08-26 DIAGNOSIS — C3492 Malignant neoplasm of unspecified part of left bronchus or lung: Secondary | ICD-10-CM

## 2014-08-26 DIAGNOSIS — C7931 Secondary malignant neoplasm of brain: Secondary | ICD-10-CM

## 2014-08-26 DIAGNOSIS — C3490 Malignant neoplasm of unspecified part of unspecified bronchus or lung: Secondary | ICD-10-CM | POA: Diagnosis not present

## 2014-08-26 DIAGNOSIS — Z923 Personal history of irradiation: Secondary | ICD-10-CM | POA: Insufficient documentation

## 2014-08-26 DIAGNOSIS — C782 Secondary malignant neoplasm of pleura: Secondary | ICD-10-CM | POA: Diagnosis not present

## 2014-08-26 DIAGNOSIS — C781 Secondary malignant neoplasm of mediastinum: Secondary | ICD-10-CM | POA: Diagnosis not present

## 2014-08-26 LAB — CBC WITH DIFFERENTIAL/PLATELET
BASO%: 0.8 % (ref 0.0–2.0)
BASOS ABS: 0 10*3/uL (ref 0.0–0.1)
EOS%: 0 % (ref 0.0–7.0)
Eosinophils Absolute: 0 10*3/uL (ref 0.0–0.5)
HCT: 33.6 % — ABNORMAL LOW (ref 34.8–46.6)
HEMOGLOBIN: 11.2 g/dL — AB (ref 11.6–15.9)
LYMPH#: 0.6 10*3/uL — AB (ref 0.9–3.3)
LYMPH%: 45.5 % (ref 14.0–49.7)
MCH: 30.1 pg (ref 25.1–34.0)
MCHC: 33.3 g/dL (ref 31.5–36.0)
MCV: 90.3 fL (ref 79.5–101.0)
MONO#: 0.2 10*3/uL (ref 0.1–0.9)
MONO%: 17.9 % — ABNORMAL HIGH (ref 0.0–14.0)
NEUT#: 0.4 10*3/uL — CL (ref 1.5–6.5)
NEUT%: 35.8 % — ABNORMAL LOW (ref 38.4–76.8)
NRBC: 0 % (ref 0–0)
Platelets: 119 10*3/uL — ABNORMAL LOW (ref 145–400)
RBC: 3.72 10*6/uL (ref 3.70–5.45)
RDW: 15.8 % — ABNORMAL HIGH (ref 11.2–14.5)
WBC: 1.2 10*3/uL — AB (ref 3.9–10.3)

## 2014-08-26 LAB — COMPREHENSIVE METABOLIC PANEL (CC13)
ALT: 26 U/L (ref 0–55)
AST: 13 U/L (ref 5–34)
Albumin: 3.8 g/dL (ref 3.5–5.0)
Alkaline Phosphatase: 49 U/L (ref 40–150)
Anion Gap: 12 mEq/L — ABNORMAL HIGH (ref 3–11)
BILIRUBIN TOTAL: 0.28 mg/dL (ref 0.20–1.20)
BUN: 14 mg/dL (ref 7.0–26.0)
CALCIUM: 9.1 mg/dL (ref 8.4–10.4)
CO2: 25 mEq/L (ref 22–29)
CREATININE: 0.8 mg/dL (ref 0.6–1.1)
Chloride: 104 mEq/L (ref 98–109)
EGFR: 88 mL/min/{1.73_m2} — AB (ref >90)
GLUCOSE: 189 mg/dL — AB (ref 70–140)
Potassium: 4.2 mEq/L (ref 3.5–5.1)
SODIUM: 141 meq/L (ref 136–145)
Total Protein: 6.5 g/dL (ref 6.4–8.3)

## 2014-08-26 MED ORDER — IOHEXOL 300 MG/ML  SOLN
100.0000 mL | Freq: Once | INTRAMUSCULAR | Status: AC | PRN
  Administered 2014-08-26: 100 mL via INTRAVENOUS

## 2014-08-26 NOTE — Telephone Encounter (Signed)
Per Dr Vista Mink, ANC 0.4, pt needs to practice neutropenic pxns.  Pt verbalized understanding.

## 2014-08-27 ENCOUNTER — Telehealth: Payer: Self-pay | Admitting: *Deleted

## 2014-08-27 NOTE — Telephone Encounter (Signed)
Patient called regarding recent lab work.  I called her back.  She asked if she could get a shot to help her counts improve.  I stated it is not time for the shot at this time.  She stated she just wants to feel better.  I asked if she had chills or fever.  She stated no.  I reminded her to got to ED if she has chills or fever > 100.5.  She stated she was aware.

## 2014-08-28 ENCOUNTER — Other Ambulatory Visit: Payer: 59

## 2014-09-04 ENCOUNTER — Encounter: Payer: Self-pay | Admitting: Internal Medicine

## 2014-09-04 ENCOUNTER — Telehealth: Payer: Self-pay | Admitting: Internal Medicine

## 2014-09-04 ENCOUNTER — Other Ambulatory Visit (HOSPITAL_BASED_OUTPATIENT_CLINIC_OR_DEPARTMENT_OTHER): Payer: 59

## 2014-09-04 ENCOUNTER — Ambulatory Visit (HOSPITAL_BASED_OUTPATIENT_CLINIC_OR_DEPARTMENT_OTHER): Payer: 59 | Admitting: Internal Medicine

## 2014-09-04 ENCOUNTER — Telehealth: Payer: Self-pay | Admitting: *Deleted

## 2014-09-04 DIAGNOSIS — D709 Neutropenia, unspecified: Secondary | ICD-10-CM

## 2014-09-04 DIAGNOSIS — C3490 Malignant neoplasm of unspecified part of unspecified bronchus or lung: Secondary | ICD-10-CM

## 2014-09-04 DIAGNOSIS — C3412 Malignant neoplasm of upper lobe, left bronchus or lung: Secondary | ICD-10-CM

## 2014-09-04 DIAGNOSIS — C3492 Malignant neoplasm of unspecified part of left bronchus or lung: Secondary | ICD-10-CM

## 2014-09-04 LAB — CBC WITH DIFFERENTIAL/PLATELET
BASO%: 0.6 % (ref 0.0–2.0)
Basophils Absolute: 0 10*3/uL (ref 0.0–0.1)
EOS%: 1 % (ref 0.0–7.0)
Eosinophils Absolute: 0 10*3/uL (ref 0.0–0.5)
HCT: 32.7 % — ABNORMAL LOW (ref 34.8–46.6)
HGB: 10.6 g/dL — ABNORMAL LOW (ref 11.6–15.9)
LYMPH%: 37.2 % (ref 14.0–49.7)
MCH: 29.8 pg (ref 25.1–34.0)
MCHC: 32.3 g/dL (ref 31.5–36.0)
MCV: 92.2 fL (ref 79.5–101.0)
MONO#: 0.4 10*3/uL (ref 0.1–0.9)
MONO%: 28.2 % — AB (ref 0.0–14.0)
NEUT#: 0.5 10*3/uL — CL (ref 1.5–6.5)
NEUT%: 33 % — ABNORMAL LOW (ref 38.4–76.8)
Platelets: 123 10*3/uL — ABNORMAL LOW (ref 145–400)
RBC: 3.55 10*6/uL — ABNORMAL LOW (ref 3.70–5.45)
RDW: 18.9 % — AB (ref 11.2–14.5)
WBC: 1.4 10*3/uL — ABNORMAL LOW (ref 3.9–10.3)
lymph#: 0.5 10*3/uL — ABNORMAL LOW (ref 0.9–3.3)

## 2014-09-04 LAB — COMPREHENSIVE METABOLIC PANEL (CC13)
ALK PHOS: 54 U/L (ref 40–150)
ALT: 35 U/L (ref 0–55)
ANION GAP: 6 meq/L (ref 3–11)
AST: 22 U/L (ref 5–34)
Albumin: 4.1 g/dL (ref 3.5–5.0)
BILIRUBIN TOTAL: 0.58 mg/dL (ref 0.20–1.20)
BUN: 15.4 mg/dL (ref 7.0–26.0)
CO2: 27 meq/L (ref 22–29)
CREATININE: 0.7 mg/dL (ref 0.6–1.1)
Calcium: 9.6 mg/dL (ref 8.4–10.4)
Chloride: 106 mEq/L (ref 98–109)
Glucose: 119 mg/dl (ref 70–140)
Potassium: 4.4 mEq/L (ref 3.5–5.1)
SODIUM: 140 meq/L (ref 136–145)
TOTAL PROTEIN: 6.9 g/dL (ref 6.4–8.3)

## 2014-09-04 LAB — MAGNESIUM (CC13): Magnesium: 2.1 mg/dl (ref 1.5–2.5)

## 2014-09-04 MED ORDER — TBO-FILGRASTIM 480 MCG/0.8ML ~~LOC~~ SOSY
480.0000 ug | PREFILLED_SYRINGE | Freq: Once | SUBCUTANEOUS | Status: AC
  Administered 2014-09-04: 480 ug via SUBCUTANEOUS
  Filled 2014-09-04: qty 0.8

## 2014-09-04 NOTE — Telephone Encounter (Signed)
Per staff message and POF I have scheduled appts. Advised scheduler of appts. JMW  

## 2014-09-04 NOTE — Telephone Encounter (Signed)
Gave avs & cal for Jan & Feb. Sent mess to sch tx.

## 2014-09-04 NOTE — Progress Notes (Signed)
[place] [phone]   Fax:([phone]) [place], MD [address], [address] [place] [address]  DIAGNOSIS: Lung cancer  Primary site: Lung (Left)  Staging method: AJCC 7th Edition  Clinical free text: Extensive stage small cell lung cancer  Clinical: Stage IV (T3, N2, M1b) signed by Curt Bears, MD on 12/17/2013 7:45 PM  Summary: Stage IV (T3, N2, M1b)   PRIOR THERAPY: 1) Whole brain irradiation under the care of Dr. Sondra Come. 2) Systemic chemotherapy with carboplatin for an AUC of 5 given on day 1, etoposide at 120 mg per meter square given on days 1, 2 and 3 with Neulasta support given on day 4. Status post 6 cycles.  CURRENT THERAPY: Systemic chemotherapy with cisplatin at 30 mg/m and irinotecan 65 mg meter squared given on days 1 and 8 every 3 weeks. Status post 2 cycles.  DISEASE STAGE: Lung cancer  Primary site: Lung (Left)  Staging method: AJCC 7th Edition  Clinical free text: Extensive stage small cell lung cancer  Clinical: Stage IV (T3, N2, M1b) signed by Curt Bears, MD on 12/17/2013 7:45 PM  Summary: Stage IV (T3, N2, M1b)  CHEMOTHERAPY INTENT: Palliative  CURRENT # OF CHEMOTHERAPY CYCLES: 3 CURRENT ANTIEMETICS: none  CURRENT SMOKING STATUS: Former smoker, quit July 2014  ORAL CHEMOTHERAPY AND CONSENT: n/a  CURRENT BISPHOSPHONATES USE: none  PAIN MANAGEMENT: oxycodone  NARCOTICS INDUCED CONSTIPATION: none  LIVING WILL AND CODE STATUS:    INTERVAL HISTORY: Carol Bond 59 y.o. female returns to the clinic today for followup visit accompanied by her husband and 2 daughters. The patient is currently undergoing second line systemic chemotherapy with reduced dose cisplatin and irinotecan status post 2 cycles. She tolerated the previous 2 cycles of her treatment fairly well except for neutropenia that requires Granix injection few times recently. She has intermittent left-sided  chest pain but no significant shortness of breath, cough or hemoptysis. She denied having any significant nausea or vomiting, no fever or chills. The patient denied having any significant weight loss or night sweats.  She had repeat CT scan of the chest, abdomen and pelvis performed recently and she is here for evaluation and discussion of her scan results.  MEDICAL HISTORY: Past Medical History  Diagnosis Date  . Pulmonary embolism   . Hypothyroid   . Radiation 12/18/13-12/31/13    whole brain 30 gray, left central chest 30 gray  . 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 Brain cancer   . Diabetes     metformin    ALLERGIES:  is allergic to actos and vicodin.  MEDICATIONS:  Current Outpatient Prescriptions  Medication Sig Dispense Refill  . albuterol (PROVENTIL HFA;VENTOLIN HFA) 108 (90 BASE) MCG/ACT inhaler Inhale 2 puffs into the lungs every 6 (six) hours as needed for wheezing or shortness of breath. 1 Inhaler 2  . Canagliflozin (INVOKANA) 300 MG TABS Take 1 tablet by mouth daily before breakfast.    . dexamethasone (DECADRON) 4 MG tablet Take 1 tablet (4 mg total) by mouth 4 (four) times daily. 40 tablet 0  . escitalopram (LEXAPRO) 10 MG tablet Take 10 mg by mouth at bedtime.     Marland Kitchen esomeprazole (NEXIUM) 20 MG capsule Take 20 mg by mouth daily as needed (for acid reflex).    . fenofibrate 160 MG tablet Take 160 mg by mouth at bedtime.     Marland Kitchen  glimepiride (AMARYL) 4 MG tablet Take 4 mg by mouth daily with breakfast.    . levothyroxine (SYNTHROID, LEVOTHROID) 150 MCG tablet Take 150 mcg by mouth daily before breakfast.    . LORazepam (ATIVAN) 1 MG tablet Take 0.5-1 mg by mouth 2 (two) times daily.     Marland Kitchen losartan (COZAAR) 50 MG tablet Take 50 mg by mouth at bedtime.     . ondansetron (ZOFRAN) 8 MG tablet Take 8 mg by mouth every 8 (eight) hours as needed for nausea or vomiting.     Marland Kitchen oxyCODONE-acetaminophen (PERCOCET) 5-325 MG per tablet Take 1-2 tablets by  mouth every 4 (four) hours as needed. 40 tablet 0  . sitaGLIPtin-metformin (JANUMET) 50-1000 MG per tablet Take 1 tablet by mouth 2 (two) times daily with a meal.    . vitamin B-12 (CYANOCOBALAMIN) 100 MCG tablet Take 100 mcg by mouth daily.     No current facility-administered medications for this visit.    SURGICAL HISTORY:  Past Surgical History  Procedure Laterality Date  . Abdominal hysterectomy    . Spine surgery    . Cervical fusion    . Video bronchoscopy with endobronchial ultrasound N/A 12/13/2013    Procedure: VIDEO BRONCHOSCOPY WITH ENDOBRONCHIAL ULTRASOUND;  Surgeon: Grace Isaac, MD;  Location: [facility];  Service: Thoracic;  Laterality: N/A;  . Portacath placement Left 12/13/2013    Procedure: INSERTION PORT-A-CATH;  Surgeon: Grace Isaac, MD;  Location: [facility];  Service: Thoracic;  Laterality: Left;    REVIEW OF SYSTEMS:  Constitutional: positive for fatigue Eyes: negative Ears, nose, mouth, throat, and face: negative Respiratory: positive for pleurisy/chest pain Cardiovascular: negative Gastrointestinal: negative Genitourinary:negative Integument/breast: negative Hematologic/lymphatic: negative Musculoskeletal:negative Neurological: positive for weakness Behavioral/Psych: negative Endocrine: negative Allergic/Immunologic: negative   PHYSICAL EXAMINATION: General appearance: alert, cooperative, fatigued and no distress Head: Normocephalic, without obvious abnormality, atraumatic Neck: no adenopathy, no JVD, supple, symmetrical, trachea midline and thyroid not enlarged, symmetric, no tenderness/mass/nodules Lymph nodes: Cervical, supraclavicular, and axillary nodes normal. Resp: clear to auscultation bilaterally Back: symmetric, no curvature. ROM normal. No CVA tenderness. Cardio: regular rate and rhythm, S1, S2 normal, no murmur, click, rub or gallop GI: soft, non-tender; bowel sounds normal; no masses,  no organomegaly Extremities: extremities normal,  atraumatic, no cyanosis or edema Neurologic: Alert and oriented X 3, normal strength and tone. Normal symmetric reflexes. Normal coordination and gait  ECOG PERFORMANCE STATUS: 1 - Symptomatic but completely ambulatory  Blood pressure 137/75, pulse 96, temperature 98.3 F (36.8 C), temperature source Oral, resp. rate 18, height 5\' 6"  (1.676 m), weight 179 lb 12.8 oz (81.557 kg), SpO2 98 %.  LABORATORY DATA: Lab Results  Component Value Date   WBC 1.4* 09/04/2014   HGB 10.6* 09/04/2014   HCT 32.7* 09/04/2014   MCV 92.2 09/04/2014   PLT 123* 09/04/2014      Chemistry      Component Value Date/Time   NA 140 09/04/2014 0851   NA 137 07/11/2014 0840   K 4.4 09/04/2014 0851   K 4.1 07/11/2014 0840   CL 97 07/11/2014 0840   CO2 27 09/04/2014 0851   CO2 24 07/11/2014 0840   BUN 15.4 09/04/2014 0851   BUN 15 07/11/2014 0840   CREATININE 0.7 09/04/2014 0851   CREATININE 0.60 07/11/2014 0840      Component Value Date/Time   CALCIUM 9.6 09/04/2014 0851   CALCIUM 10.3 07/11/2014 0840   ALKPHOS 54 09/04/2014 0851   ALKPHOS 54 12/19/2013 0320   AST  22 09/04/2014 0851   AST 24 12/19/2013 0320   ALT 35 09/04/2014 0851   ALT 48* 12/19/2013 0320   BILITOT 0.58 09/04/2014 0851   BILITOT <0.2* 12/19/2013 0320       RADIOGRAPHIC STUDIES: Ct Head Wo Contrast  08/12/2014   CLINICAL DATA:  Dizziness. Drowsiness. Forgetfulness. Metastatic lung cancer to the brain.  EXAM: CT HEAD WITHOUT CONTRAST  TECHNIQUE: Contiguous axial images were obtained from the base of the skull through the vertex without intravenous contrast.  COMPARISON:  06/18/2014  FINDINGS: The patient has known metastatic lesions anteriorly in the right temporal lobe, along the right caudate head, in both upper parietal lobes, and along the left posterior frontal lobe. These are essentially an apparent on today' s CT ; IV contrast was not administered and MRI does enjoy greater sensitivity for such small lesions compared to  CT.  Periventricular white matter and corona radiata hypodensities favor chronic ischemic microvascular white matter disease. Faint calcifications in the globus pallidus nuclei bilaterally, likely physiologic.  Otherwise, the brainstem, cerebellum, cerebral peduncles, thalamus, basal ganglia, basilar cisterns, and ventricular system appear within normal limits. No intracranial hemorrhage, mass lesion, or acute CVA.  IMPRESSION: 1. The previously visualized small metastatic lesions scattered in the brain on prior MRI are not readily apparent on today's noncontrast CT scan. This does not necessarily indicate that they have changed or improved given the considerably higher sensitivity of contrast-enhanced MRI for such lesions. I do not see any new lesions today. No acute intracranial findings. 2. Periventricular white matter and corona radiata hypodensities favor chronic ischemic microvascular white matter disease.   Electronically Signed   By: Sherryl Barters M.D.   On: 08/12/2014 13:10   Ct Chest W Contrast  08/26/2014   CLINICAL DATA:  Restaging extensive stage small-cell lung cancer with previous radiation therapy to the brain. Chemotherapy ongoing.  EXAM: CT CHEST, ABDOMEN, AND PELVIS WITH CONTRAST  TECHNIQUE: Multidetector CT imaging of the chest, abdomen and pelvis was performed following the standard protocol during bolus administration of intravenous contrast.  CONTRAST:  114mL OMNIPAQUE IOHEXOL 300 MG/ML  SOLN  COMPARISON:  Prior examinations 07/09/2014 and 05/02/2014.  FINDINGS: CT CHEST FINDINGS  Mediastinum: There is a 1.4 x 1.4 cm left superior mediastinal lymph node on image number 11 consistent with a metastasis. No discretely enlarged lymph nodes are seen within the middle mediastinum or hilar stations. There is stable soft tissue fullness in the left suprahilar/AP window region. There is progressive nodularity along the inferior aspect of the esophagus and retrocrural space suspicious for  adenopathy (see axial images 41 through 52). The thyroid gland, trachea and esophagus demonstrate no significant findings. The heart size is normal. There is no pericardial effusion.There is persistent nodularity along the left pleural surface of the heart consistent with pleural metastases. A pericardiac node measuring 7 mm adjacent to the cardiac apex has enlarged (image 48). The left subclavian Port-A-Cath tip is in the lower SVC.  Lungs/Pleura: Pleural fluid on the left has decreased in volume. There is persistent pleural thickening and nodularity as described above.There has been further contraction of the ill-defined process involving the left perihilar region, most consistent with treated tumor and evolving radiation change. No discrete residual mass is apparent in this region. The right lung remains clear.  Musculoskeletal/Chest wall: No chest wall mass or suspicious osseous findings.  CT ABDOMEN AND PELVIS FINDINGS  Hepatobiliary: The hypervascular lesion in the dome of the left hepatic lobe on image 44 is stable, consistent  with a hemangioma. The liver otherwise appears unremarkable. No evidence of gallstones, gallbladder wall thickening or biliary dilatation.  Pancreas: Unremarkable. No pancreatic ductal dilatation or surrounding inflammatory changes.  Spleen: Normal in size without focal abnormality.  Adrenals/Urinary Tract: The left adrenal metastasis has enlarged, measuring 2.5 x 2.0 cm on image 58. Mild nodularity of the lateral limb of the right adrenal gland is stable compared with the most recent study, although mildly progressive from older prior studies.The kidneys appear normal without evidence of urinary tract calculus or hydronephrosis. No bladder abnormalities are seen.  Stomach/Bowel: No evidence of bowel wall thickening, distention or surrounding inflammatory change.No ascites or focal extraluminal fluid collection.  Vascular/Lymphatic: There are no enlarged abdominal or pelvic lymph nodes.  Mild aortoiliac atherosclerosis appears unchanged.  Reproductive: There is stable hyperdensity centrally in the perineum status post hysterectomy. No adnexal mass. The ovaries appear unchanged.  Other: No evidence of abdominal wall mass or hernia.  Musculoskeletal: No acute or significant osseous findings.  IMPRESSION: 1. Evolving radiation changes in the left perihilar lung. 2. Slightly progressive pleural/pericardial metastatic disease in the left hemithorax without significant fluid. Superior left mediastinal and lower paraesophageal nodes have also progressed, consistent with worsening metastatic disease. Residual soft tissue fullness in the AP window has not significantly changed. 3. Slight worsening of bilateral adrenal metastases. 4. No other evidence of metastatic disease demonstrated.   Electronically Signed   By: Camie Patience M.D.   On: 08/26/2014 17:17   Mr Jeri Cos BT Contrast  08/12/2014   CLINICAL DATA:  Dizziness. Altered mental status. Metastatic lung cancer.  EXAM: MRI HEAD WITHOUT AND WITH CONTRAST  TECHNIQUE: Multiplanar, multiecho pulse sequences of the brain and surrounding structures were obtained without and with intravenous contrast.  CONTRAST:  16mL MULTIHANCE GADOBENATE DIMEGLUMINE 529 MG/ML IV SOLN  COMPARISON:  CT head 08/12/2014.  MRI 06/18/2014  FINDINGS: The ventricle size is normal. Hypodensity throughout the cerebral white matter bilaterally is stable and likely related to chemo and radiation affect. Hyperintensity in the pons also unchanged and may be related to above or chronic microvascular ischemic change.  Negative for acute infarct.  Multiple enhancing metastatic deposits were identified on the prior study which show interval improvement. Today study is degraded by motion however the lesions appear improved.  Right lateral inferior temporal lobe lesion no longer visualized.  2 mm enhancing lesion right caudate head unchanged  Superficial enhancing lesion in the left  posterior frontal lobe measuring approximately 3 x 4 mm unchanged. This is adjacent to a vessel.  Cluster of enhancing lesions in the left occipital parietal lobe have nearly completely resolved. Small area of enhancement is present in this area measuring approximately 3 mm.  Enhancing lesion in the high right parietal lobe has resolved. Small amount of associated hemorrhage again noted.  No new metastatic deposits are identified. No mass effect or midline shift.  IMPRESSION: Negative for acute infarct.  Multiple enhancing metastatic deposits with overall improvement since 06/18/2014. No new lesions  Extensive signal change throughout the cerebral white matter and brainstem likely related to chemo and radiation.   Electronically Signed   By: Franchot Gallo M.D.   On: 08/12/2014 18:10   Ct Abdomen Pelvis W Contrast  08/26/2014   CLINICAL DATA:  Restaging extensive stage small-cell lung cancer with previous radiation therapy to the brain. Chemotherapy ongoing.  EXAM: CT CHEST, ABDOMEN, AND PELVIS WITH CONTRAST  TECHNIQUE: Multidetector CT imaging of the chest, abdomen and pelvis was performed following the standard  protocol during bolus administration of intravenous contrast.  CONTRAST:  117mL OMNIPAQUE IOHEXOL 300 MG/ML  SOLN  COMPARISON:  Prior examinations 07/09/2014 and 05/02/2014.  FINDINGS: CT CHEST FINDINGS  Mediastinum: There is a 1.4 x 1.4 cm left superior mediastinal lymph node on image number 11 consistent with a metastasis. No discretely enlarged lymph nodes are seen within the middle mediastinum or hilar stations. There is stable soft tissue fullness in the left suprahilar/AP window region. There is progressive nodularity along the inferior aspect of the esophagus and retrocrural space suspicious for adenopathy (see axial images 41 through 52). The thyroid gland, trachea and esophagus demonstrate no significant findings. The heart size is normal. There is no pericardial effusion.There is persistent  nodularity along the left pleural surface of the heart consistent with pleural metastases. A pericardiac node measuring 7 mm adjacent to the cardiac apex has enlarged (image 48). The left subclavian Port-A-Cath tip is in the lower SVC.  Lungs/Pleura: Pleural fluid on the left has decreased in volume. There is persistent pleural thickening and nodularity as described above.There has been further contraction of the ill-defined process involving the left perihilar region, most consistent with treated tumor and evolving radiation change. No discrete residual mass is apparent in this region. The right lung remains clear.  Musculoskeletal/Chest wall: No chest wall mass or suspicious osseous findings.  CT ABDOMEN AND PELVIS FINDINGS  Hepatobiliary: The hypervascular lesion in the dome of the left hepatic lobe on image 44 is stable, consistent with a hemangioma. The liver otherwise appears unremarkable. No evidence of gallstones, gallbladder wall thickening or biliary dilatation.  Pancreas: Unremarkable. No pancreatic ductal dilatation or surrounding inflammatory changes.  Spleen: Normal in size without focal abnormality.  Adrenals/Urinary Tract: The left adrenal metastasis has enlarged, measuring 2.5 x 2.0 cm on image 58. Mild nodularity of the lateral limb of the right adrenal gland is stable compared with the most recent study, although mildly progressive from older prior studies.The kidneys appear normal without evidence of urinary tract calculus or hydronephrosis. No bladder abnormalities are seen.  Stomach/Bowel: No evidence of bowel wall thickening, distention or surrounding inflammatory change.No ascites or focal extraluminal fluid collection.  Vascular/Lymphatic: There are no enlarged abdominal or pelvic lymph nodes. Mild aortoiliac atherosclerosis appears unchanged.  Reproductive: There is stable hyperdensity centrally in the perineum status post hysterectomy. No adnexal mass. The ovaries appear unchanged.  Other:  No evidence of abdominal wall mass or hernia.  Musculoskeletal: No acute or significant osseous findings.  IMPRESSION: 1. Evolving radiation changes in the left perihilar lung. 2. Slightly progressive pleural/pericardial metastatic disease in the left hemithorax without significant fluid. Superior left mediastinal and lower paraesophageal nodes have also progressed, consistent with worsening metastatic disease. Residual soft tissue fullness in the AP window has not significantly changed. 3. Slight worsening of bilateral adrenal metastases. 4. No other evidence of metastatic disease demonstrated.   Electronically Signed   By: Camie Patience M.D.   On: 08/26/2014 17:17    ASSESSMENT AND PLAN: This is a very pleasant 59 years old white female recently diagnosed with extensive stage small cell lung cancer status post whole brain irradiation in addition to 6 cycles of systemic chemotherapy with carboplatin and etoposide with significant improvement in her disease but unfortunately a few months later she started having evidence for disease progression. The patient is currently undergoing second line chemotherapy with cisplatin and irinotecan status post 2 cycles. She is tolerating her treatment fairly well except for neutropenia. The recent CT scan of the  chest, abdomen and pelvis by report showed slight disease progression but on reviewing the images by myself and with the patient and her family and did not see a significant difference compared to the previous CT scans performed 6 weeks ago and there could be actually some mild improvement. I recommended for the patient to proceed with her systemic chemotherapy with cisplatin and irinotecan as scheduled but we will delay the start of cycle #3 by 1 week the cause of the neutropenia. For the chemotherapy-induced neutropenia, I will start the patient on Granix 480 g subcutaneously for the next 3 days. She would come back for follow-up visit in 4 weeks with the start  of cycle #4 of her treatment. She was advised to call immediately if she has any concerning symptoms in the interval.  She was advised to call immediately if she has any concerning symptoms in the interval. The patient voices understanding of current disease status and treatment options and is in agreement with the current care plan.  All questions were answered. The patient knows to call the clinic with any problems, questions or concerns. We can certainly see the patient much sooner if necessary.  I spent 20 minutes counseling the patient face to face. The total time spent in the appointment was 30 minutes.  Disclaimer: This note was dictated with voice recognition software. Similar sounding words can inadvertently be transcribed and may not be corrected upon review.

## 2014-09-05 ENCOUNTER — Ambulatory Visit (HOSPITAL_BASED_OUTPATIENT_CLINIC_OR_DEPARTMENT_OTHER): Payer: 59

## 2014-09-05 DIAGNOSIS — C3492 Malignant neoplasm of unspecified part of left bronchus or lung: Secondary | ICD-10-CM

## 2014-09-05 DIAGNOSIS — D701 Agranulocytosis secondary to cancer chemotherapy: Secondary | ICD-10-CM

## 2014-09-05 MED ORDER — TBO-FILGRASTIM 480 MCG/0.8ML ~~LOC~~ SOSY
480.0000 ug | PREFILLED_SYRINGE | Freq: Once | SUBCUTANEOUS | Status: AC
  Administered 2014-09-05: 480 ug via SUBCUTANEOUS
  Filled 2014-09-05: qty 0.8

## 2014-09-06 ENCOUNTER — Ambulatory Visit (HOSPITAL_BASED_OUTPATIENT_CLINIC_OR_DEPARTMENT_OTHER): Payer: 59

## 2014-09-06 DIAGNOSIS — D701 Agranulocytosis secondary to cancer chemotherapy: Secondary | ICD-10-CM

## 2014-09-06 DIAGNOSIS — C3492 Malignant neoplasm of unspecified part of left bronchus or lung: Secondary | ICD-10-CM

## 2014-09-06 MED ORDER — TBO-FILGRASTIM 480 MCG/0.8ML ~~LOC~~ SOSY
480.0000 ug | PREFILLED_SYRINGE | Freq: Once | SUBCUTANEOUS | Status: AC
  Administered 2014-09-06: 480 ug via SUBCUTANEOUS

## 2014-09-08 ENCOUNTER — Encounter: Payer: Self-pay | Admitting: Internal Medicine

## 2014-09-08 NOTE — Progress Notes (Signed)
Put [employer] disability form on nurse's desk

## 2014-09-10 ENCOUNTER — Ambulatory Visit (HOSPITAL_BASED_OUTPATIENT_CLINIC_OR_DEPARTMENT_OTHER): Payer: 59

## 2014-09-10 ENCOUNTER — Encounter: Payer: Self-pay | Admitting: Internal Medicine

## 2014-09-10 ENCOUNTER — Other Ambulatory Visit (HOSPITAL_BASED_OUTPATIENT_CLINIC_OR_DEPARTMENT_OTHER): Payer: 59

## 2014-09-10 DIAGNOSIS — C3490 Malignant neoplasm of unspecified part of unspecified bronchus or lung: Secondary | ICD-10-CM

## 2014-09-10 DIAGNOSIS — C3412 Malignant neoplasm of upper lobe, left bronchus or lung: Secondary | ICD-10-CM

## 2014-09-10 DIAGNOSIS — Z5111 Encounter for antineoplastic chemotherapy: Secondary | ICD-10-CM

## 2014-09-10 LAB — CBC WITH DIFFERENTIAL/PLATELET
BASO%: 0.7 % (ref 0.0–2.0)
Basophils Absolute: 0 10*3/uL (ref 0.0–0.1)
EOS%: 0.7 % (ref 0.0–7.0)
Eosinophils Absolute: 0 10*3/uL (ref 0.0–0.5)
HEMATOCRIT: 33.1 % — AB (ref 34.8–46.6)
HGB: 10.6 g/dL — ABNORMAL LOW (ref 11.6–15.9)
LYMPH#: 0.6 10*3/uL — AB (ref 0.9–3.3)
LYMPH%: 25.3 % (ref 14.0–49.7)
MCH: 29.7 pg (ref 25.1–34.0)
MCHC: 31.9 g/dL (ref 31.5–36.0)
MCV: 93.1 fL (ref 79.5–101.0)
MONO#: 0.4 10*3/uL (ref 0.1–0.9)
MONO%: 16 % — ABNORMAL HIGH (ref 0.0–14.0)
NEUT#: 1.5 10*3/uL (ref 1.5–6.5)
NEUT%: 57.3 % (ref 38.4–76.8)
Platelets: 96 10*3/uL — ABNORMAL LOW (ref 145–400)
RBC: 3.55 10*6/uL — ABNORMAL LOW (ref 3.70–5.45)
RDW: 19.3 % — ABNORMAL HIGH (ref 11.2–14.5)
WBC: 2.5 10*3/uL — AB (ref 3.9–10.3)

## 2014-09-10 LAB — COMPREHENSIVE METABOLIC PANEL (CC13)
ALT: 40 U/L (ref 0–55)
AST: 26 U/L (ref 5–34)
Albumin: 4 g/dL (ref 3.5–5.0)
Alkaline Phosphatase: 60 U/L (ref 40–150)
Anion Gap: 8 mEq/L (ref 3–11)
BILIRUBIN TOTAL: 0.4 mg/dL (ref 0.20–1.20)
BUN: 10.2 mg/dL (ref 7.0–26.0)
CALCIUM: 9.2 mg/dL (ref 8.4–10.4)
CO2: 28 meq/L (ref 22–29)
CREATININE: 0.8 mg/dL (ref 0.6–1.1)
Chloride: 106 mEq/L (ref 98–109)
EGFR: 86 mL/min/{1.73_m2} — ABNORMAL LOW (ref >90)
Glucose: 219 mg/dl — ABNORMAL HIGH (ref 70–140)
Potassium: 4.3 mEq/L (ref 3.5–5.1)
Sodium: 142 mEq/L (ref 136–145)
TOTAL PROTEIN: 6.7 g/dL (ref 6.4–8.3)

## 2014-09-10 MED ORDER — HEPARIN SOD (PORK) LOCK FLUSH 100 UNIT/ML IV SOLN
500.0000 [IU] | Freq: Once | INTRAVENOUS | Status: AC | PRN
  Administered 2014-09-10: 500 [IU]
  Filled 2014-09-10: qty 5

## 2014-09-10 MED ORDER — SODIUM CHLORIDE 0.9 % IV SOLN
150.0000 mg | Freq: Once | INTRAVENOUS | Status: AC
  Administered 2014-09-10: 150 mg via INTRAVENOUS
  Filled 2014-09-10: qty 5

## 2014-09-10 MED ORDER — ATROPINE SULFATE 1 MG/ML IJ SOLN
INTRAMUSCULAR | Status: AC
  Filled 2014-09-10: qty 1

## 2014-09-10 MED ORDER — PALONOSETRON HCL INJECTION 0.25 MG/5ML
INTRAVENOUS | Status: AC
  Filled 2014-09-10: qty 5

## 2014-09-10 MED ORDER — DEXAMETHASONE SODIUM PHOSPHATE 20 MG/5ML IJ SOLN
12.0000 mg | Freq: Once | INTRAMUSCULAR | Status: AC
  Administered 2014-09-10: 12 mg via INTRAVENOUS

## 2014-09-10 MED ORDER — SODIUM CHLORIDE 0.9 % IJ SOLN
10.0000 mL | INTRAMUSCULAR | Status: DC | PRN
  Administered 2014-09-10: 10 mL
  Filled 2014-09-10: qty 10

## 2014-09-10 MED ORDER — DEXAMETHASONE SODIUM PHOSPHATE 20 MG/5ML IJ SOLN
INTRAMUSCULAR | Status: AC
  Filled 2014-09-10: qty 5

## 2014-09-10 MED ORDER — IRINOTECAN HCL CHEMO INJECTION 100 MG/5ML
65.0000 mg/m2 | Freq: Once | INTRAVENOUS | Status: AC
  Administered 2014-09-10: 122 mg via INTRAVENOUS
  Filled 2014-09-10: qty 6.1

## 2014-09-10 MED ORDER — SODIUM CHLORIDE 0.9 % IV SOLN
Freq: Once | INTRAVENOUS | Status: AC
  Administered 2014-09-10: 09:00:00 via INTRAVENOUS

## 2014-09-10 MED ORDER — ATROPINE SULFATE 1 MG/ML IJ SOLN
0.5000 mg | Freq: Once | INTRAMUSCULAR | Status: AC | PRN
  Administered 2014-09-10: 0.5 mg via INTRAVENOUS

## 2014-09-10 MED ORDER — SODIUM CHLORIDE 0.9 % IV SOLN
30.0000 mg/m2 | Freq: Once | INTRAVENOUS | Status: AC
  Administered 2014-09-10: 57 mg via INTRAVENOUS
  Filled 2014-09-10: qty 57

## 2014-09-10 MED ORDER — POTASSIUM CHLORIDE 2 MEQ/ML IV SOLN
Freq: Once | INTRAVENOUS | Status: AC
  Administered 2014-09-10: 09:00:00 via INTRAVENOUS
  Filled 2014-09-10: qty 10

## 2014-09-10 MED ORDER — PALONOSETRON HCL INJECTION 0.25 MG/5ML
0.2500 mg | Freq: Once | INTRAVENOUS | Status: AC
  Administered 2014-09-10: 0.25 mg via INTRAVENOUS

## 2014-09-10 NOTE — Progress Notes (Signed)
Ok to treat without waiting for CMET results and platelets: 96 per Dr. Julien Nordmann.

## 2014-09-10 NOTE — Progress Notes (Signed)
Faxed disability form to [facility] @ [phone]

## 2014-09-10 NOTE — Patient Instructions (Addendum)
[facility] Discharge Instructions for Patients Receiving Chemotherapy  Today you received the following chemotherapy agents Cisplatin/Irinotecan.  To help prevent nausea and vomiting after your treatment, we encourage you to take your nausea medication as directed.    If you develop nausea and vomiting that is not controlled by your nausea medication, call the clinic.   BELOW ARE SYMPTOMS THAT SHOULD BE REPORTED IMMEDIATELY:  *FEVER GREATER THAN 100.5 F  *CHILLS WITH OR WITHOUT FEVER  NAUSEA AND VOMITING THAT IS NOT CONTROLLED WITH YOUR NAUSEA MEDICATION  *UNUSUAL SHORTNESS OF BREATH  *UNUSUAL BRUISING OR BLEEDING  TENDERNESS IN MOUTH AND THROAT WITH OR WITHOUT PRESENCE OF ULCERS  *URINARY PROBLEMS  *BOWEL PROBLEMS  UNUSUAL RASH Items with * indicate a potential emergency and should be followed up as soon as possible.  Feel free to call the clinic should you have any questions or concerns. The clinic phone number is [phone].

## 2014-09-11 ENCOUNTER — Other Ambulatory Visit: Payer: 59

## 2014-09-17 ENCOUNTER — Other Ambulatory Visit (HOSPITAL_BASED_OUTPATIENT_CLINIC_OR_DEPARTMENT_OTHER): Payer: 59

## 2014-09-17 ENCOUNTER — Other Ambulatory Visit: Payer: Self-pay | Admitting: *Deleted

## 2014-09-17 ENCOUNTER — Encounter: Payer: Self-pay | Admitting: Internal Medicine

## 2014-09-17 ENCOUNTER — Ambulatory Visit (HOSPITAL_BASED_OUTPATIENT_CLINIC_OR_DEPARTMENT_OTHER): Payer: 59

## 2014-09-17 DIAGNOSIS — C3412 Malignant neoplasm of upper lobe, left bronchus or lung: Secondary | ICD-10-CM

## 2014-09-17 DIAGNOSIS — C7931 Secondary malignant neoplasm of brain: Secondary | ICD-10-CM

## 2014-09-17 DIAGNOSIS — C3490 Malignant neoplasm of unspecified part of unspecified bronchus or lung: Secondary | ICD-10-CM

## 2014-09-17 DIAGNOSIS — Z5111 Encounter for antineoplastic chemotherapy: Secondary | ICD-10-CM

## 2014-09-17 LAB — CBC WITH DIFFERENTIAL/PLATELET
BASO%: 0.5 % (ref 0.0–2.0)
BASOS ABS: 0 10*3/uL (ref 0.0–0.1)
EOS ABS: 0 10*3/uL (ref 0.0–0.5)
EOS%: 0.5 % (ref 0.0–7.0)
HEMATOCRIT: 30 % — AB (ref 34.8–46.6)
HEMOGLOBIN: 9.9 g/dL — AB (ref 11.6–15.9)
LYMPH%: 26.5 % (ref 14.0–49.7)
MCH: 30.4 pg (ref 25.1–34.0)
MCHC: 33 g/dL (ref 31.5–36.0)
MCV: 92 fL (ref 79.5–101.0)
MONO#: 0.2 10*3/uL (ref 0.1–0.9)
MONO%: 10.7 % (ref 0.0–14.0)
NEUT%: 61.8 % (ref 38.4–76.8)
NEUTROS ABS: 1.2 10*3/uL — AB (ref 1.5–6.5)
PLATELETS: 114 10*3/uL — AB (ref 145–400)
RBC: 3.26 10*6/uL — ABNORMAL LOW (ref 3.70–5.45)
RDW: 17.7 % — ABNORMAL HIGH (ref 11.2–14.5)
WBC: 2 10*3/uL — ABNORMAL LOW (ref 3.9–10.3)
lymph#: 0.5 10*3/uL — ABNORMAL LOW (ref 0.9–3.3)

## 2014-09-17 LAB — COMPREHENSIVE METABOLIC PANEL (CC13)
ALBUMIN: 4 g/dL (ref 3.5–5.0)
ALK PHOS: 56 U/L (ref 40–150)
ALT: 33 U/L (ref 0–55)
AST: 21 U/L (ref 5–34)
Anion Gap: 11 mEq/L (ref 3–11)
BUN: 20.2 mg/dL (ref 7.0–26.0)
CO2: 25 mEq/L (ref 22–29)
Calcium: 9.7 mg/dL (ref 8.4–10.4)
Chloride: 103 mEq/L (ref 98–109)
Creatinine: 0.8 mg/dL (ref 0.6–1.1)
EGFR: 84 mL/min/{1.73_m2} — ABNORMAL LOW (ref >90)
Glucose: 174 mg/dl — ABNORMAL HIGH (ref 70–140)
Potassium: 4.3 mEq/L (ref 3.5–5.1)
SODIUM: 140 meq/L (ref 136–145)
TOTAL PROTEIN: 7.1 g/dL (ref 6.4–8.3)
Total Bilirubin: 0.33 mg/dL (ref 0.20–1.20)

## 2014-09-17 MED ORDER — ATROPINE SULFATE 1 MG/ML IJ SOLN
0.5000 mg | Freq: Once | INTRAMUSCULAR | Status: AC | PRN
  Administered 2014-09-17: 0.5 mg via INTRAVENOUS

## 2014-09-17 MED ORDER — PALONOSETRON HCL INJECTION 0.25 MG/5ML
INTRAVENOUS | Status: AC
  Filled 2014-09-17: qty 5

## 2014-09-17 MED ORDER — PALONOSETRON HCL INJECTION 0.25 MG/5ML
0.2500 mg | Freq: Once | INTRAVENOUS | Status: AC
  Administered 2014-09-17: 0.25 mg via INTRAVENOUS

## 2014-09-17 MED ORDER — HEPARIN SOD (PORK) LOCK FLUSH 100 UNIT/ML IV SOLN
500.0000 [IU] | Freq: Once | INTRAVENOUS | Status: AC | PRN
  Administered 2014-09-17: 500 [IU]
  Filled 2014-09-17: qty 5

## 2014-09-17 MED ORDER — SODIUM CHLORIDE 0.9 % IV SOLN
150.0000 mg | Freq: Once | INTRAVENOUS | Status: AC
  Administered 2014-09-17: 150 mg via INTRAVENOUS
  Filled 2014-09-17: qty 5

## 2014-09-17 MED ORDER — SODIUM CHLORIDE 0.9 % IV SOLN
30.0000 mg/m2 | Freq: Once | INTRAVENOUS | Status: AC
  Administered 2014-09-17: 57 mg via INTRAVENOUS
  Filled 2014-09-17: qty 57

## 2014-09-17 MED ORDER — ATROPINE SULFATE 1 MG/ML IJ SOLN
INTRAMUSCULAR | Status: AC
  Filled 2014-09-17: qty 1

## 2014-09-17 MED ORDER — DEXAMETHASONE SODIUM PHOSPHATE 20 MG/5ML IJ SOLN
INTRAMUSCULAR | Status: AC
  Filled 2014-09-17: qty 5

## 2014-09-17 MED ORDER — SODIUM CHLORIDE 0.9 % IV SOLN
Freq: Once | INTRAVENOUS | Status: AC
  Administered 2014-09-17: 09:00:00 via INTRAVENOUS

## 2014-09-17 MED ORDER — POTASSIUM CHLORIDE 2 MEQ/ML IV SOLN
Freq: Once | INTRAVENOUS | Status: AC
  Administered 2014-09-17: 10:00:00 via INTRAVENOUS
  Filled 2014-09-17: qty 10

## 2014-09-17 MED ORDER — SODIUM CHLORIDE 0.9 % IJ SOLN
10.0000 mL | INTRAMUSCULAR | Status: DC | PRN
  Administered 2014-09-17: 10 mL
  Filled 2014-09-17: qty 10

## 2014-09-17 MED ORDER — IRINOTECAN HCL CHEMO INJECTION 100 MG/5ML
64.0000 mg/m2 | Freq: Once | INTRAVENOUS | Status: AC
  Administered 2014-09-17: 120 mg via INTRAVENOUS
  Filled 2014-09-17: qty 6

## 2014-09-17 MED ORDER — DEXAMETHASONE SODIUM PHOSPHATE 20 MG/5ML IJ SOLN
12.0000 mg | Freq: Once | INTRAMUSCULAR | Status: AC
  Administered 2014-09-17: 12 mg via INTRAVENOUS

## 2014-09-17 NOTE — Progress Notes (Signed)
OK to treat with WBC of 2.0 and ANC of 1.2 per Dr. Julien Nordmann.  Pt. To receive neulasta tomorrow.  Patient is in agreement with this plan.

## 2014-09-17 NOTE — Progress Notes (Signed)
Put daughter's fmla form on nurse's desk

## 2014-09-17 NOTE — Patient Instructions (Signed)
[facility] Discharge Instructions for Patients Receiving Chemotherapy  Today you received the following chemotherapy agents Cisplatin/Irinotecan.  To help prevent nausea and vomiting after your treatment, we encourage you to take your nausea medication as directed.    If you develop nausea and vomiting that is not controlled by your nausea medication, call the clinic.   BELOW ARE SYMPTOMS THAT SHOULD BE REPORTED IMMEDIATELY:  *FEVER GREATER THAN 100.5 F  *CHILLS WITH OR WITHOUT FEVER  NAUSEA AND VOMITING THAT IS NOT CONTROLLED WITH YOUR NAUSEA MEDICATION  *UNUSUAL SHORTNESS OF BREATH  *UNUSUAL BRUISING OR BLEEDING  TENDERNESS IN MOUTH AND THROAT WITH OR WITHOUT PRESENCE OF ULCERS  *URINARY PROBLEMS  *BOWEL PROBLEMS  UNUSUAL RASH Items with * indicate a potential emergency and should be followed up as soon as possible.  Feel free to call the clinic should you have any questions or concerns. The clinic phone number is [phone].

## 2014-09-18 ENCOUNTER — Ambulatory Visit (HOSPITAL_BASED_OUTPATIENT_CLINIC_OR_DEPARTMENT_OTHER): Payer: 59

## 2014-09-18 VITALS — BP 113/62 | HR 100 | Temp 98.4°F | Resp 18

## 2014-09-18 DIAGNOSIS — C7931 Secondary malignant neoplasm of brain: Secondary | ICD-10-CM

## 2014-09-18 DIAGNOSIS — Z5189 Encounter for other specified aftercare: Secondary | ICD-10-CM

## 2014-09-18 DIAGNOSIS — C3412 Malignant neoplasm of upper lobe, left bronchus or lung: Secondary | ICD-10-CM

## 2014-09-18 MED ORDER — PEGFILGRASTIM INJECTION 6 MG/0.6ML ~~LOC~~
6.0000 mg | PREFILLED_SYRINGE | Freq: Once | SUBCUTANEOUS | Status: AC
  Administered 2014-09-18: 6 mg via SUBCUTANEOUS
  Filled 2014-09-18: qty 0.6

## 2014-09-18 NOTE — Patient Instructions (Signed)
Pegfilgrastim injection What is this medicine? PEGFILGRASTIM (peg fil GRA stim) is a long-acting granulocyte colony-stimulating factor that stimulates the growth of neutrophils, a type of white blood cell important in the body's fight against infection. It is used to reduce the incidence of fever and infection in patients with certain types of cancer who are receiving chemotherapy that affects the bone marrow. This medicine may be used for other purposes; ask your health care provider or pharmacist if you have questions. COMMON BRAND NAME(S): Neulasta What should I tell my health care provider before I take this medicine? They need to know if you have any of these conditions: -latex allergy -ongoing radiation therapy -sickle cell disease -skin reactions to acrylic adhesives (On-Body Injector only) -an unusual or allergic reaction to pegfilgrastim, filgrastim, other medicines, foods, dyes, or preservatives -pregnant or trying to get pregnant -breast-feeding How should I use this medicine? This medicine is for injection under the skin. If you get this medicine at home, you will be taught how to prepare and give the pre-filled syringe or how to use the On-body Injector. Refer to the patient Instructions for Use for detailed instructions. Use exactly as directed. Take your medicine at regular intervals. Do not take your medicine more often than directed. It is important that you put your used needles and syringes in a special sharps container. Do not put them in a trash can. If you do not have a sharps container, call your pharmacist or healthcare provider to get one. Talk to your pediatrician regarding the use of this medicine in children. Special care may be needed. Overdosage: If you think you have taken too much of this medicine contact a poison control center or emergency room at once. NOTE: This medicine is only for you. Do not share this medicine with others. What if I miss a dose? It is  important not to miss your dose. Call your doctor or health care professional if you miss your dose. If you miss a dose due to an On-body Injector failure or leakage, a new dose should be administered as soon as possible using a single prefilled syringe for manual use. What may interact with this medicine? Interactions have not been studied. Give your health care provider a list of all the medicines, herbs, non-prescription drugs, or dietary supplements you use. Also tell them if you smoke, drink alcohol, or use illegal drugs. Some items may interact with your medicine. This list may not describe all possible interactions. Give your health care provider a list of all the medicines, herbs, non-prescription drugs, or dietary supplements you use. Also tell them if you smoke, drink alcohol, or use illegal drugs. Some items may interact with your medicine. What should I watch for while using this medicine? You may need blood work done while you are taking this medicine. If you are going to need a MRI, CT scan, or other procedure, tell your doctor that you are using this medicine (On-Body Injector only). What side effects may I notice from receiving this medicine? Side effects that you should report to your doctor or health care professional as soon as possible: -allergic reactions like skin rash, itching or hives, swelling of the face, lips, or tongue -dizziness -fever -pain, redness, or irritation at site where injected -pinpoint red spots on the skin -shortness of breath or breathing problems -stomach or side pain, or pain at the shoulder -swelling -tiredness -trouble passing urine Side effects that usually do not require medical attention (report to your doctor   or health care professional if they continue or are bothersome): -bone pain -muscle pain This list may not describe all possible side effects. Call your doctor for medical advice about side effects. You may report side effects to FDA at  1-800-FDA-1088. Where should I keep my medicine? Keep out of the reach of children. Store pre-filled syringes in a refrigerator between 2 and 8 degrees C (36 and 46 degrees F). Do not freeze. Keep in carton to protect from light. Throw away this medicine if it is left out of the refrigerator for more than 48 hours. Throw away any unused medicine after the expiration date. NOTE: This sheet is a summary. It may not cover all possible information. If you have questions about this medicine, talk to your doctor, pharmacist, or health care provider.  2015, Elsevier/Gold Standard. (2013-11-14 16:14:05)  

## 2014-09-24 ENCOUNTER — Other Ambulatory Visit (HOSPITAL_BASED_OUTPATIENT_CLINIC_OR_DEPARTMENT_OTHER): Payer: 59

## 2014-09-24 DIAGNOSIS — C3490 Malignant neoplasm of unspecified part of unspecified bronchus or lung: Secondary | ICD-10-CM

## 2014-09-24 DIAGNOSIS — C3412 Malignant neoplasm of upper lobe, left bronchus or lung: Secondary | ICD-10-CM

## 2014-09-24 LAB — COMPREHENSIVE METABOLIC PANEL (CC13)
ALBUMIN: 4.3 g/dL (ref 3.5–5.0)
ALT: 41 U/L (ref 0–55)
AST: 28 U/L (ref 5–34)
Alkaline Phosphatase: 94 U/L (ref 40–150)
Anion Gap: 14 mEq/L — ABNORMAL HIGH (ref 3–11)
BUN: 10.4 mg/dL (ref 7.0–26.0)
CO2: 24 meq/L (ref 22–29)
CREATININE: 0.8 mg/dL (ref 0.6–1.1)
Calcium: 9.4 mg/dL (ref 8.4–10.4)
Chloride: 102 mEq/L (ref 98–109)
EGFR: 82 mL/min/{1.73_m2} — ABNORMAL LOW (ref >90)
GLUCOSE: 182 mg/dL — AB (ref 70–140)
Potassium: 4.8 mEq/L (ref 3.5–5.1)
SODIUM: 139 meq/L (ref 136–145)
TOTAL PROTEIN: 7.1 g/dL (ref 6.4–8.3)
Total Bilirubin: 0.56 mg/dL (ref 0.20–1.20)

## 2014-09-24 LAB — MAGNESIUM (CC13): MAGNESIUM: 2.1 mg/dL (ref 1.5–2.5)

## 2014-09-24 LAB — CBC WITH DIFFERENTIAL/PLATELET
BASO%: 0.7 % (ref 0.0–2.0)
Basophils Absolute: 0.1 10*3/uL (ref 0.0–0.1)
EOS%: 0.1 % (ref 0.0–7.0)
Eosinophils Absolute: 0 10*3/uL (ref 0.0–0.5)
HEMATOCRIT: 31.5 % — AB (ref 34.8–46.6)
HEMOGLOBIN: 10 g/dL — AB (ref 11.6–15.9)
LYMPH#: 0.7 10*3/uL — AB (ref 0.9–3.3)
LYMPH%: 8.9 % — ABNORMAL LOW (ref 14.0–49.7)
MCH: 30.4 pg (ref 25.1–34.0)
MCHC: 31.9 g/dL (ref 31.5–36.0)
MCV: 95.3 fL (ref 79.5–101.0)
MONO#: 0.9 10*3/uL (ref 0.1–0.9)
MONO%: 10.9 % (ref 0.0–14.0)
NEUT%: 79.4 % — AB (ref 38.4–76.8)
NEUTROS ABS: 6.5 10*3/uL (ref 1.5–6.5)
PLATELETS: 136 10*3/uL — AB (ref 145–400)
RBC: 3.3 10*6/uL — ABNORMAL LOW (ref 3.70–5.45)
RDW: 20.9 % — AB (ref 11.2–14.5)
WBC: 8.2 10*3/uL (ref 3.9–10.3)

## 2014-09-25 ENCOUNTER — Other Ambulatory Visit: Payer: Self-pay | Admitting: Radiation Therapy

## 2014-09-25 DIAGNOSIS — C7931 Secondary malignant neoplasm of brain: Secondary | ICD-10-CM

## 2014-10-01 ENCOUNTER — Other Ambulatory Visit: Payer: Self-pay | Admitting: Physician Assistant

## 2014-10-01 ENCOUNTER — Other Ambulatory Visit: Payer: Self-pay | Admitting: *Deleted

## 2014-10-01 ENCOUNTER — Ambulatory Visit (HOSPITAL_BASED_OUTPATIENT_CLINIC_OR_DEPARTMENT_OTHER): Payer: 59

## 2014-10-01 ENCOUNTER — Other Ambulatory Visit: Payer: Self-pay | Admitting: Internal Medicine

## 2014-10-01 VITALS — BP 132/72 | HR 95 | Temp 97.7°F | Resp 20

## 2014-10-01 DIAGNOSIS — C7951 Secondary malignant neoplasm of bone: Secondary | ICD-10-CM

## 2014-10-01 DIAGNOSIS — C3412 Malignant neoplasm of upper lobe, left bronchus or lung: Secondary | ICD-10-CM

## 2014-10-01 DIAGNOSIS — C3490 Malignant neoplasm of unspecified part of unspecified bronchus or lung: Secondary | ICD-10-CM

## 2014-10-01 DIAGNOSIS — Z5111 Encounter for antineoplastic chemotherapy: Secondary | ICD-10-CM

## 2014-10-01 LAB — MAGNESIUM (CC13): MAGNESIUM: 2.1 mg/dL (ref 1.5–2.5)

## 2014-10-01 LAB — CBC WITH DIFFERENTIAL/PLATELET
BASO%: 0.3 % (ref 0.0–2.0)
Basophils Absolute: 0 10*3/uL (ref 0.0–0.1)
EOS ABS: 0 10*3/uL (ref 0.0–0.5)
EOS%: 0.3 % (ref 0.0–7.0)
HCT: 29.5 % — ABNORMAL LOW (ref 34.8–46.6)
HGB: 9.6 g/dL — ABNORMAL LOW (ref 11.6–15.9)
LYMPH%: 16.7 % (ref 14.0–49.7)
MCH: 32.1 pg (ref 25.1–34.0)
MCHC: 32.5 g/dL (ref 31.5–36.0)
MCV: 98.7 fL (ref 79.5–101.0)
MONO#: 0.4 10*3/uL (ref 0.1–0.9)
MONO%: 12.4 % (ref 0.0–14.0)
NEUT#: 2.5 10*3/uL (ref 1.5–6.5)
NEUT%: 70.3 % (ref 38.4–76.8)
PLATELETS: 133 10*3/uL — AB (ref 145–400)
RBC: 2.99 10*6/uL — AB (ref 3.70–5.45)
RDW: 21.1 % — ABNORMAL HIGH (ref 11.2–14.5)
WBC: 3.5 10*3/uL — ABNORMAL LOW (ref 3.9–10.3)
lymph#: 0.6 10*3/uL — ABNORMAL LOW (ref 0.9–3.3)

## 2014-10-01 LAB — COMPREHENSIVE METABOLIC PANEL (CC13)
ALT: 42 U/L (ref 0–55)
AST: 22 U/L (ref 5–34)
Albumin: 4 g/dL (ref 3.5–5.0)
Alkaline Phosphatase: 79 U/L (ref 40–150)
Anion Gap: 14 mEq/L — ABNORMAL HIGH (ref 3–11)
BILIRUBIN TOTAL: 0.35 mg/dL (ref 0.20–1.20)
BUN: 13.2 mg/dL (ref 7.0–26.0)
CO2: 21 meq/L — AB (ref 22–29)
CREATININE: 0.8 mg/dL (ref 0.6–1.1)
Calcium: 9.2 mg/dL (ref 8.4–10.4)
Chloride: 104 mEq/L (ref 98–109)
EGFR: 86 mL/min/{1.73_m2} — AB (ref >90)
GLUCOSE: 258 mg/dL — AB (ref 70–140)
Potassium: 3.7 mEq/L (ref 3.5–5.1)
Sodium: 140 mEq/L (ref 136–145)
TOTAL PROTEIN: 6.8 g/dL (ref 6.4–8.3)

## 2014-10-01 MED ORDER — DEXTROSE-NACL 5-0.45 % IV SOLN
Freq: Once | INTRAVENOUS | Status: AC
  Administered 2014-10-01: 10:00:00 via INTRAVENOUS
  Filled 2014-10-01: qty 10

## 2014-10-01 MED ORDER — DEXAMETHASONE SODIUM PHOSPHATE 20 MG/5ML IJ SOLN
12.0000 mg | Freq: Once | INTRAMUSCULAR | Status: AC
  Administered 2014-10-01: 12 mg via INTRAVENOUS

## 2014-10-01 MED ORDER — PALONOSETRON HCL INJECTION 0.25 MG/5ML
0.2500 mg | Freq: Once | INTRAVENOUS | Status: AC
  Administered 2014-10-01: 0.25 mg via INTRAVENOUS

## 2014-10-01 MED ORDER — SODIUM CHLORIDE 0.9 % IV SOLN
Freq: Once | INTRAVENOUS | Status: AC
  Administered 2014-10-01: 10:00:00 via INTRAVENOUS

## 2014-10-01 MED ORDER — PALONOSETRON HCL INJECTION 0.25 MG/5ML
INTRAVENOUS | Status: AC
  Filled 2014-10-01: qty 5

## 2014-10-01 MED ORDER — ATROPINE SULFATE 1 MG/ML IJ SOLN
0.5000 mg | Freq: Once | INTRAMUSCULAR | Status: AC | PRN
  Administered 2014-10-01: 0.5 mg via INTRAVENOUS

## 2014-10-01 MED ORDER — SODIUM CHLORIDE 0.9 % IJ SOLN
10.0000 mL | INTRAMUSCULAR | Status: DC | PRN
  Filled 2014-10-01: qty 10

## 2014-10-01 MED ORDER — SODIUM CHLORIDE 0.9 % IV SOLN
30.0000 mg/m2 | Freq: Once | INTRAVENOUS | Status: AC
  Administered 2014-10-01: 57 mg via INTRAVENOUS
  Filled 2014-10-01: qty 57

## 2014-10-01 MED ORDER — ONDANSETRON 16 MG/50ML IVPB (CHCC)
INTRAVENOUS | Status: AC
  Filled 2014-10-01: qty 16

## 2014-10-01 MED ORDER — OXYCODONE-ACETAMINOPHEN 5-325 MG PO TABS
ORAL_TABLET | ORAL | Status: AC
  Filled 2014-10-01: qty 1

## 2014-10-01 MED ORDER — DEXAMETHASONE SODIUM PHOSPHATE 20 MG/5ML IJ SOLN
INTRAMUSCULAR | Status: AC
  Filled 2014-10-01: qty 5

## 2014-10-01 MED ORDER — OXYCODONE-ACETAMINOPHEN 5-325 MG PO TABS
1.0000 | ORAL_TABLET | Freq: Once | ORAL | Status: AC
  Administered 2014-10-01: 1 via ORAL

## 2014-10-01 MED ORDER — SODIUM CHLORIDE 0.9 % IV SOLN
150.0000 mg | Freq: Once | INTRAVENOUS | Status: AC
  Administered 2014-10-01: 150 mg via INTRAVENOUS
  Filled 2014-10-01: qty 5

## 2014-10-01 MED ORDER — HEPARIN SOD (PORK) LOCK FLUSH 100 UNIT/ML IV SOLN
500.0000 [IU] | Freq: Once | INTRAVENOUS | Status: AC | PRN
  Filled 2014-10-01: qty 5

## 2014-10-01 MED ORDER — IRINOTECAN HCL CHEMO INJECTION 100 MG/5ML
64.0000 mg/m2 | Freq: Once | INTRAVENOUS | Status: AC
  Administered 2014-10-01: 120 mg via INTRAVENOUS
  Filled 2014-10-01: qty 6

## 2014-10-01 MED ORDER — ATROPINE SULFATE 1 MG/ML IJ SOLN
INTRAMUSCULAR | Status: AC
  Filled 2014-10-01: qty 1

## 2014-10-01 NOTE — Patient Instructions (Signed)
[facility] Discharge Instructions for Patients Receiving Chemotherapy  Today you received the following chemotherapy agents Cisplatin/Irinotecan.  To help prevent nausea and vomiting after your treatment, we encourage you to take your nausea medication as prescribed.   If you develop nausea and vomiting that is not controlled by your nausea medication, call the clinic.   BELOW ARE SYMPTOMS THAT SHOULD BE REPORTED IMMEDIATELY:  *FEVER GREATER THAN 100.5 F  *CHILLS WITH OR WITHOUT FEVER  NAUSEA AND VOMITING THAT IS NOT CONTROLLED WITH YOUR NAUSEA MEDICATION  *UNUSUAL SHORTNESS OF BREATH  *UNUSUAL BRUISING OR BLEEDING  TENDERNESS IN MOUTH AND THROAT WITH OR WITHOUT PRESENCE OF ULCERS  *URINARY PROBLEMS  *BOWEL PROBLEMS  UNUSUAL RASH Items with * indicate a potential emergency and should be followed up as soon as possible.  Feel free to call the clinic you have any questions or concerns. The clinic phone number is [phone].

## 2014-10-02 ENCOUNTER — Ambulatory Visit (HOSPITAL_BASED_OUTPATIENT_CLINIC_OR_DEPARTMENT_OTHER): Payer: 59 | Admitting: Physician Assistant

## 2014-10-02 ENCOUNTER — Other Ambulatory Visit: Payer: 59

## 2014-10-02 ENCOUNTER — Encounter: Payer: Self-pay | Admitting: Physician Assistant

## 2014-10-02 ENCOUNTER — Telehealth: Payer: Self-pay | Admitting: Internal Medicine

## 2014-10-02 ENCOUNTER — Telehealth: Payer: Self-pay | Admitting: *Deleted

## 2014-10-02 VITALS — BP 125/64 | HR 81 | Temp 97.8°F | Resp 18 | Ht 66.0 in | Wt 180.6 lb

## 2014-10-02 DIAGNOSIS — C7931 Secondary malignant neoplasm of brain: Secondary | ICD-10-CM

## 2014-10-02 DIAGNOSIS — C3412 Malignant neoplasm of upper lobe, left bronchus or lung: Secondary | ICD-10-CM

## 2014-10-02 MED ORDER — OXYCODONE-ACETAMINOPHEN 5-325 MG PO TABS: 1.0000 | ORAL_TABLET | ORAL | Status: DC | PRN

## 2014-10-02 NOTE — Patient Instructions (Signed)
Continue labs and chemotherapy as scheduled Follow-up in 3 weeks with a restaging CT scan of your chest, abdomen and pelvis to reevaluate your disease

## 2014-10-02 NOTE — Progress Notes (Addendum)
[facility] [phone]   Fax:([phone]) [place], MD [address], [address] [place] [address]  DIAGNOSIS: Lung cancer  Primary site: Lung (Left)  Staging method: AJCC 7th Edition  Clinical free text: Extensive stage small cell lung cancer  Clinical: Stage IV (T3, N2, M1b) signed by Curt Bears, MD on 12/17/2013 7:45 PM  Summary: Stage IV (T3, N2, M1b)   PRIOR THERAPY: 1) Whole brain irradiation under the care of Dr. Sondra Come. 2) Systemic chemotherapy with carboplatin for an AUC of 5 given on day 1, etoposide at 120 mg per meter square given on days 1, 2 and 3 with Neulasta support given on day 4. Status post 6 cycles.  CURRENT THERAPY: Systemic chemotherapy with cisplatin at 30 mg/m and irinotecan 65 mg meter squared given on days 1 and 8 every 3 weeks. Status post 3 cycles.  DISEASE STAGE: Lung cancer  Primary site: Lung (Left)  Staging method: AJCC 7th Edition  Clinical free text: Extensive stage small cell lung cancer  Clinical: Stage IV (T3, N2, M1b) signed by Curt Bears, MD on 12/17/2013 7:45 PM  Summary: Stage IV (T3, N2, M1b)  CHEMOTHERAPY INTENT: Palliative  CURRENT # OF CHEMOTHERAPY CYCLES: 4 CURRENT ANTIEMETICS: none  CURRENT SMOKING STATUS: Former smoker, quit July 2014  ORAL CHEMOTHERAPY AND CONSENT: n/a  CURRENT BISPHOSPHONATES USE: none  PAIN MANAGEMENT: oxycodone  NARCOTICS INDUCED CONSTIPATION: none  LIVING WILL AND CODE STATUS:    INTERVAL HISTORY: Carol Bond 59 y.o. female returns to the clinic today for followup visit accompanied by her daughter. The patient is currently undergoing second line systemic chemotherapy with reduced dose cisplatin and irinotecan status post 3 cycles. She tolerated the previous 3 cycles of her treatment fairly well except for neutropenia that requires Granix injection few times recently. She also has had some diarrhea usually occurring  about 3-4 days after chemotherapy and also causing some anal irritation with a little bit of bright red blood on the tissue when she wipes. She denies any blood contained in the diarrhea or in the commode. She occasionally also has some blood-tinged nasal secretions, not associated with any fever chills or yellow or green nasal secretions. She continues to complain of upper back pain between her shoulder blades. She requests a refill for her Percocet. She has intermittent left-sided chest pain but no significant shortness of breath, cough or hemoptysis. She denied having any significant nausea or vomiting, no fever or chills. The patient denied having any significant weight loss or night sweats.    MEDICAL HISTORY: Past Medical History  Diagnosis Date  . Pulmonary embolism   . Hypothyroid   . Radiation 12/18/13-12/31/13    whole brain 30 gray, left central chest 30 gray  . 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 Brain cancer   . Diabetes     metformin    ALLERGIES:  is allergic to actos and vicodin.  MEDICATIONS:  Current Outpatient Prescriptions  Medication Sig Dispense Refill  . Canagliflozin (INVOKANA) 300 MG TABS Take 1 tablet by mouth daily before breakfast.    . escitalopram (LEXAPRO) 10 MG tablet Take 10 mg by mouth at bedtime.     Marland Kitchen esomeprazole (NEXIUM) 20 MG capsule Take 20 mg by mouth daily as needed (for acid reflex).    . fenofibrate 160 MG tablet Take 160 mg by mouth at bedtime.     Marland Kitchen  glimepiride (AMARYL) 4 MG tablet Take 4 mg by mouth daily with breakfast.    . levothyroxine (SYNTHROID, LEVOTHROID) 150 MCG tablet Take 150 mcg by mouth daily before breakfast.    . LORazepam (ATIVAN) 1 MG tablet Take 0.5-1 mg by mouth 2 (two) times daily.     Marland Kitchen losartan (COZAAR) 50 MG tablet Take 50 mg by mouth at bedtime.     . ondansetron (ZOFRAN) 8 MG tablet Take 8 mg by mouth every 8 (eight) hours as needed for nausea or vomiting.     Marland Kitchen  oxyCODONE-acetaminophen (PERCOCET) 5-325 MG per tablet Take 1-2 tablets by mouth every 4 (four) hours as needed. 40 tablet 0  . sitaGLIPtin-metformin (JANUMET) 50-1000 MG per tablet Take 1 tablet by mouth 2 (two) times daily with a meal.    . vitamin B-12 (CYANOCOBALAMIN) 100 MCG tablet Take 100 mcg by mouth daily.    Marland Kitchen albuterol (PROVENTIL HFA;VENTOLIN HFA) 108 (90 BASE) MCG/ACT inhaler Inhale 2 puffs into the lungs every 6 (six) hours as needed for wheezing or shortness of breath. (Patient not taking: Reported on 10/02/2014) 1 Inhaler 2   No current facility-administered medications for this visit.   Facility-Administered Medications Ordered in Other Visits  Medication Dose Route Frequency Provider Last Rate Last Dose  . sodium chloride 0.9 % injection 10 mL  10 mL Intracatheter PRN Curt Bears, MD        SURGICAL HISTORY:  Past Surgical History  Procedure Laterality Date  . Abdominal hysterectomy    . Spine surgery    . Cervical fusion    . Video bronchoscopy with endobronchial ultrasound N/A 12/13/2013    Procedure: VIDEO BRONCHOSCOPY WITH ENDOBRONCHIAL ULTRASOUND;  Surgeon: Grace Isaac, MD;  Location: [facility];  Service: Thoracic;  Laterality: N/A;  . Portacath placement Left 12/13/2013    Procedure: INSERTION PORT-A-CATH;  Surgeon: Grace Isaac, MD;  Location: [facility];  Service: Thoracic;  Laterality: Left;    REVIEW OF SYSTEMS:  Constitutional: positive for fatigue Eyes: negative Ears, nose, mouth, throat, and face: negative Respiratory: positive for pleurisy/chest pain Cardiovascular: negative Gastrointestinal: negative Genitourinary:negative Integument/breast: negative Hematologic/lymphatic: negative Musculoskeletal:negative Neurological: positive for weakness Behavioral/Psych: negative Endocrine: negative Allergic/Immunologic: negative   PHYSICAL EXAMINATION: General appearance: alert, cooperative, fatigued and no distress Head: Normocephalic, without obvious  abnormality, atraumatic Neck: no adenopathy, no JVD, supple, symmetrical, trachea midline and thyroid not enlarged, symmetric, no tenderness/mass/nodules Lymph nodes: Cervical, supraclavicular, and axillary nodes normal. Resp: clear to auscultation bilaterally Back: symmetric, no curvature. ROM normal. No CVA tenderness. Cardio: regular rate and rhythm, S1, S2 normal, no murmur, click, rub or gallop GI: soft, non-tender; bowel sounds normal; no masses,  no organomegaly Extremities: extremities normal, atraumatic, no cyanosis or edema Neurologic: Alert and oriented X 3, normal strength and tone. Normal symmetric reflexes. Normal coordination and gait  ECOG PERFORMANCE STATUS: 1 - Symptomatic but completely ambulatory  Blood pressure 125/64, pulse 81, temperature 97.8 F (36.6 C), temperature source Oral, resp. rate 18, height 5\' 6"  (1.676 m), weight 180 lb 9.6 oz (81.92 kg), SpO2 100 %.  LABORATORY DATA: Lab Results  Component Value Date   WBC 3.5* 10/01/2014   HGB 9.6* 10/01/2014   HCT 29.5* 10/01/2014   MCV 98.7 10/01/2014   PLT 133* 10/01/2014      Chemistry      Component Value Date/Time   NA 140 10/01/2014 0931   NA 137 07/11/2014 0840   K 3.7 10/01/2014 0931   K 4.1 07/11/2014 0840  CL 97 07/11/2014 0840   CO2 21* 10/01/2014 0931   CO2 24 07/11/2014 0840   BUN 13.2 10/01/2014 0931   BUN 15 07/11/2014 0840   CREATININE 0.8 10/01/2014 0931   CREATININE 0.60 07/11/2014 0840      Component Value Date/Time   CALCIUM 9.2 10/01/2014 0931   CALCIUM 10.3 07/11/2014 0840   ALKPHOS 79 10/01/2014 0931   ALKPHOS 54 12/19/2013 0320   AST 22 10/01/2014 0931   AST 24 12/19/2013 0320   ALT 42 10/01/2014 0931   ALT 48* 12/19/2013 0320   BILITOT 0.35 10/01/2014 0931   BILITOT <0.2* 12/19/2013 0320       RADIOGRAPHIC STUDIES: No results found.  ASSESSMENT AND PLAN: This is a very pleasant 59 years old white female recently diagnosed with extensive stage small cell lung  cancer status post whole brain irradiation in addition to 6 cycles of systemic chemotherapy with carboplatin and etoposide with significant improvement in her disease but unfortunately a few months later she started having evidence for disease progression. The patient is currently undergoing second line chemotherapy with cisplatin and irinotecan status post 3 cycles. She is tolerating her treatment fairly well except for neutropenia. The last CT scan of the chest, abdomen and pelvis by report showed slight disease progression but on reviewing the images by myself and with the patient and her family and Dr. Julien Nordmann did not see a significant difference compared to the previous CT scans performed 6 weeks ago and there could be actually some mild improvement. The patient was discussed with and also seen by Dr. Julien Nordmann. She will complete cycle #4 of her stomach chemotherapy with cisplatin and irinotecan as scheduled. She'll follow-up in 3 weeks with a restaging CT scan of the chest, abdomen and pelvis with contrast to reevaluate her disease. She was given a refill prescription for her Percocet tablets for pain management. She will continue with weekly labs in the interim. She was advised to call immediately if she has any concerning symptoms in the interval.  She was advised to call immediately if she has any concerning symptoms in the interval. The patient voices understanding of current disease status and treatment options and is in agreement with the current care plan.  All questions were answered. The patient knows to call the clinic with any problems, questions or concerns. We can certainly see the patient much sooner if necessary.  Carlton Adam, PA-C 10/02/2014  ADDENDUM: Hematology/Oncology Attending: I had a face to face encounter with the patient. I recommended her care plan. This is a very pleasant 59 years old white female with extensive stage small cell lung cancer who is currently  undergoing second line chemotherapy with cisplatin and irinotecan is status post 3 cycles. She is tolerating her treatment well with no significant adverse effect except for mild fatigue. I recommended for the patient to proceed with cycle #4 today as a scheduled. She would come back for follow-up visit in 3 weeks for reevaluation after repeating CT scan of the chest, abdomen and pelvis for restaging of her disease. She was advised to call immediately if she has any concerning symptoms in the interval..   Disclaimer: This note was dictated with voice recognition software. Similar sounding words can inadvertently be transcribed and may not be corrected upon review. Eilleen Kempf., MD 10/04/2014

## 2014-10-02 NOTE — Telephone Encounter (Signed)
Per staff message and POF I have scheduled appts. Advised scheduler of appts. JMW  

## 2014-10-02 NOTE — Telephone Encounter (Signed)
Pt confirmed labs/ov per 02/04 POF, gave pt AVS.... KJ, sent msg to add chemo

## 2014-10-03 ENCOUNTER — Ambulatory Visit
Admission: RE | Admit: 2014-10-03 | Discharge: 2014-10-03 | Disposition: A | Discharge status: Home / Self Care | Payer: 59 | Source: Ambulatory Visit | Attending: Radiation Oncology | Admitting: Radiation Oncology

## 2014-10-03 DIAGNOSIS — C7931 Secondary malignant neoplasm of brain: Secondary | ICD-10-CM

## 2014-10-03 MED ORDER — GADOBENATE DIMEGLUMINE 529 MG/ML IV SOLN
16.0000 mL | Freq: Once | INTRAVENOUS | Status: AC | PRN
  Administered 2014-10-03: 16 mL via INTRAVENOUS

## 2014-10-06 ENCOUNTER — Ambulatory Visit: Payer: Self-pay | Admitting: Radiation Oncology

## 2014-10-07 ENCOUNTER — Encounter: Payer: Self-pay | Admitting: Radiation Oncology

## 2014-10-07 NOTE — Progress Notes (Signed)
Location/Histology of Brain Tumor: Nine parenchymal brain metastases all new since 08/12/2014  Patient presented with symptoms of:  No symptoms reported found on routine MRI  Past or anticipated interventions, if any, per neurosurgery: no  Past or anticipated interventions, if any, per medical oncology: yes,  PRIOR THERAPY: 1) Whole brain irradiation under the care of Dr. Sondra Come. 2) Systemic chemotherapy with carboplatin for an AUC of 5 given on day 1, etoposide at 120 mg per meter square given on days 1, 2 and 3 with Neulasta support given on day 4. Status post 6 cycles.  CURRENT THERAPY: Systemic chemotherapy with cisplatin at 30 mg/m and irinotecan 65 mg meter squared given on days 1 and 8 every 3 weeks. Status post 3 cycles.  Dose of Decadron, if applicable:   Recent neurologic symptoms, if any:   Seizures: NO  Headaches: no  Nausea: no  Dizziness/ataxia: no  Difficulty with hand coordination: no  Focal numbness/weakness: no  Visual deficits/changes: no  Confusion/Memory deficits: no  Painful bone metastases at present, if any: yes, upper back pain across shoulder blades  SAFETY ISSUES:  Prior radiation? Yes; previously had whole brain and one single course of salvage SRS  Pacemaker/ICD? no  Possible current pregnancy? no  Is the patient on methotrexate? no  Additional Complaints / other details: 59 year old female. Whole brain SIM scheduled for 0900 today.

## 2014-10-08 ENCOUNTER — Ambulatory Visit (HOSPITAL_BASED_OUTPATIENT_CLINIC_OR_DEPARTMENT_OTHER): Payer: 59

## 2014-10-08 ENCOUNTER — Other Ambulatory Visit (HOSPITAL_BASED_OUTPATIENT_CLINIC_OR_DEPARTMENT_OTHER): Payer: 59

## 2014-10-08 ENCOUNTER — Other Ambulatory Visit: Payer: Self-pay | Admitting: Physician Assistant

## 2014-10-08 DIAGNOSIS — C3412 Malignant neoplasm of upper lobe, left bronchus or lung: Secondary | ICD-10-CM

## 2014-10-08 DIAGNOSIS — C7931 Secondary malignant neoplasm of brain: Secondary | ICD-10-CM

## 2014-10-08 DIAGNOSIS — Z5112 Encounter for antineoplastic immunotherapy: Secondary | ICD-10-CM

## 2014-10-08 DIAGNOSIS — C3492 Malignant neoplasm of unspecified part of left bronchus or lung: Secondary | ICD-10-CM

## 2014-10-08 DIAGNOSIS — Z5189 Encounter for other specified aftercare: Secondary | ICD-10-CM

## 2014-10-08 LAB — COMPREHENSIVE METABOLIC PANEL (CC13)
ALBUMIN: 4.3 g/dL (ref 3.5–5.0)
ALT: 46 U/L (ref 0–55)
AST: 24 U/L (ref 5–34)
Alkaline Phosphatase: 68 U/L (ref 40–150)
Anion Gap: 10 mEq/L (ref 3–11)
BUN: 15.3 mg/dL (ref 7.0–26.0)
CO2: 24 mEq/L (ref 22–29)
Calcium: 10 mg/dL (ref 8.4–10.4)
Chloride: 109 mEq/L (ref 98–109)
Creatinine: 0.8 mg/dL (ref 0.6–1.1)
EGFR: 88 mL/min/{1.73_m2} — ABNORMAL LOW (ref >90)
GLUCOSE: 151 mg/dL — AB (ref 70–140)
POTASSIUM: 4.1 meq/L (ref 3.5–5.1)
Sodium: 143 mEq/L (ref 136–145)
Total Bilirubin: 0.32 mg/dL (ref 0.20–1.20)
Total Protein: 7.2 g/dL (ref 6.4–8.3)

## 2014-10-08 LAB — CBC WITH DIFFERENTIAL/PLATELET
BASO%: 0.4 % (ref 0.0–2.0)
Basophils Absolute: 0 10*3/uL (ref 0.0–0.1)
EOS%: 0.8 % (ref 0.0–7.0)
Eosinophils Absolute: 0 10*3/uL (ref 0.0–0.5)
HCT: 31.3 % — ABNORMAL LOW (ref 34.8–46.6)
HGB: 10.2 g/dL — ABNORMAL LOW (ref 11.6–15.9)
LYMPH#: 0.7 10*3/uL — AB (ref 0.9–3.3)
LYMPH%: 27.3 % (ref 14.0–49.7)
MCH: 32.6 pg (ref 25.1–34.0)
MCHC: 32.6 g/dL (ref 31.5–36.0)
MCV: 100 fL (ref 79.5–101.0)
MONO#: 0.4 10*3/uL (ref 0.1–0.9)
MONO%: 16.5 % — ABNORMAL HIGH (ref 0.0–14.0)
NEUT%: 55 % (ref 38.4–76.8)
NEUTROS ABS: 1.4 10*3/uL — AB (ref 1.5–6.5)
PLATELETS: 114 10*3/uL — AB (ref 145–400)
RBC: 3.13 10*6/uL — AB (ref 3.70–5.45)
RDW: 20.4 % — ABNORMAL HIGH (ref 11.2–14.5)
WBC: 2.6 10*3/uL — AB (ref 3.9–10.3)

## 2014-10-08 LAB — MAGNESIUM (CC13): MAGNESIUM: 2.2 mg/dL (ref 1.5–2.5)

## 2014-10-08 MED ORDER — SODIUM CHLORIDE 0.9 % IJ SOLN
10.0000 mL | INTRAMUSCULAR | Status: DC | PRN
  Administered 2014-10-08: 10 mL
  Filled 2014-10-08: qty 10

## 2014-10-08 MED ORDER — HEPARIN SOD (PORK) LOCK FLUSH 100 UNIT/ML IV SOLN
500.0000 [IU] | Freq: Once | INTRAVENOUS | Status: AC | PRN
  Administered 2014-10-08: 500 [IU]
  Filled 2014-10-08: qty 5

## 2014-10-08 MED ORDER — PEGFILGRASTIM 6 MG/0.6ML ~~LOC~~ PSKT
6.0000 mg | PREFILLED_SYRINGE | Freq: Once | SUBCUTANEOUS | Status: AC
  Administered 2014-10-08: 6 mg via SUBCUTANEOUS
  Filled 2014-10-08: qty 0.6

## 2014-10-08 MED ORDER — SODIUM CHLORIDE 0.9 % IV SOLN
30.0000 mg/m2 | Freq: Once | INTRAVENOUS | Status: AC
  Administered 2014-10-08: 57 mg via INTRAVENOUS
  Filled 2014-10-08: qty 57

## 2014-10-08 MED ORDER — DEXTROSE-NACL 5-0.45 % IV SOLN
Freq: Once | INTRAVENOUS | Status: AC
  Administered 2014-10-08: 10:00:00 via INTRAVENOUS
  Filled 2014-10-08: qty 10

## 2014-10-08 MED ORDER — PALONOSETRON HCL INJECTION 0.25 MG/5ML
INTRAVENOUS | Status: AC
  Filled 2014-10-08: qty 5

## 2014-10-08 MED ORDER — PALONOSETRON HCL INJECTION 0.25 MG/5ML
0.2500 mg | Freq: Once | INTRAVENOUS | Status: AC
  Administered 2014-10-08: 0.25 mg via INTRAVENOUS

## 2014-10-08 MED ORDER — ATROPINE SULFATE 1 MG/ML IJ SOLN
0.5000 mg | Freq: Once | INTRAMUSCULAR | Status: AC | PRN
  Administered 2014-10-08: 0.5 mg via INTRAVENOUS

## 2014-10-08 MED ORDER — DEXAMETHASONE SODIUM PHOSPHATE 20 MG/5ML IJ SOLN
INTRAMUSCULAR | Status: AC
  Filled 2014-10-08: qty 5

## 2014-10-08 MED ORDER — DEXAMETHASONE SODIUM PHOSPHATE 20 MG/5ML IJ SOLN
12.0000 mg | Freq: Once | INTRAMUSCULAR | Status: AC
  Administered 2014-10-08: 12 mg via INTRAVENOUS

## 2014-10-08 MED ORDER — SODIUM CHLORIDE 0.9 % IV SOLN
150.0000 mg | Freq: Once | INTRAVENOUS | Status: AC
  Administered 2014-10-08: 150 mg via INTRAVENOUS
  Filled 2014-10-08: qty 5

## 2014-10-08 MED ORDER — SODIUM CHLORIDE 0.9 % IV SOLN
Freq: Once | INTRAVENOUS | Status: AC
  Administered 2014-10-08: 10:00:00 via INTRAVENOUS

## 2014-10-08 MED ORDER — ATROPINE SULFATE 1 MG/ML IJ SOLN
INTRAMUSCULAR | Status: AC
  Filled 2014-10-08: qty 1

## 2014-10-08 MED ORDER — PEGFILGRASTIM 6 MG/0.6ML ~~LOC~~ PSKT
6.0000 mg | PREFILLED_SYRINGE | Freq: Once | SUBCUTANEOUS | Status: DC
  Filled 2014-10-08: qty 0.6

## 2014-10-08 MED ORDER — IRINOTECAN HCL CHEMO INJECTION 100 MG/5ML
64.0000 mg/m2 | Freq: Once | INTRAVENOUS | Status: AC
  Administered 2014-10-08: 120 mg via INTRAVENOUS
  Filled 2014-10-08: qty 6

## 2014-10-08 NOTE — Progress Notes (Signed)
Neut= 1.4 today; okay to treat per Awilda Metro, PA.  Urine output = 425cc @ 1000 am Total urine output pre-Cisplatin = 1025 cc

## 2014-10-08 NOTE — Patient Instructions (Signed)
[facility] Discharge Instructions for Patients Receiving Chemotherapy  Today you received the following chemotherapy agents Cisplatin/Camptosar  To help prevent nausea and vomiting after your treatment, we encourage you to take your nausea medication   If you develop nausea and vomiting that is not controlled by your nausea medication, call the clinic.   BELOW ARE SYMPTOMS THAT SHOULD BE REPORTED IMMEDIATELY:  *FEVER GREATER THAN 100.5 F  *CHILLS WITH OR WITHOUT FEVER  NAUSEA AND VOMITING THAT IS NOT CONTROLLED WITH YOUR NAUSEA MEDICATION  *UNUSUAL SHORTNESS OF BREATH  *UNUSUAL BRUISING OR BLEEDING  TENDERNESS IN MOUTH AND THROAT WITH OR WITHOUT PRESENCE OF ULCERS  *URINARY PROBLEMS  *BOWEL PROBLEMS  UNUSUAL RASH Items with * indicate a potential emergency and should be followed up as soon as possible.  Feel free to call the clinic you have any questions or concerns. The clinic phone number is [phone].

## 2014-10-08 NOTE — Progress Notes (Signed)
  Radiation Oncology         (336) [phone] ________________________________  Name: EVERLEY EVORA MRN: [id number]  Date: 10/09/2014  DOB: 1955-08-30  SIMULATION AND TREATMENT PLANNING NOTE    ICD-9-CM ICD-10-CM   1. Brain metastases 198.3 C79.31     DIAGNOSIS:  59 year old woman with numerous recurrent brain metastases following previous whole brain radiation and salvage stereotactic radiosurgery from small cell lung cancer  NARRATIVE:  The patient was brought to the [facility].  Identity was confirmed.  All relevant records and images related to the planned course of therapy were reviewed.  The patient freely provided informed written consent to proceed with treatment after reviewing the details related to the planned course of therapy. The consent form was witnessed and verified by the simulation staff.  Then, the patient was set-up in a stable reproducible  supine position for radiation therapy.  CT images were obtained.  Surface markings were placed.  The CT images were loaded into the planning software.  Then the target and avoidance structures were contoured.  Treatment planning then occurred.  The radiation prescription was entered and confirmed.  Then, I designed and supervised the construction of a total of 1 medically necessary complex treatment in the form of a thermoplastic facemask for positioning..  I have requested : Isodose Plan.   SPECIAL TREATMENT PROCEDURE:  The planned course of therapy using radiation constitutes a special treatment procedure. Special care is required in the management of this patient for the following reasons. This treatment constitutes a Special Treatment Procedure for the following reason: [ Retreatment in a previously radiated area requiring careful monitoring of increased risk of toxicity due to overlap of previous treatment..  The special nature of the planned course of radiotherapy will require increased physician supervision and  oversight to ensure patient's safety with optimal treatment outcomes.  PLAN:  The patient will receive 30 Gy in 12 fraction.  ________________________________  [name] Tammi Klippel, M.D.

## 2014-10-09 ENCOUNTER — Ambulatory Visit
Admission: RE | Admit: 2014-10-09 | Discharge: 2014-10-09 | Disposition: A | Discharge status: Home / Self Care | Payer: 59 | Source: Ambulatory Visit | Attending: Radiation Oncology | Admitting: Radiation Oncology

## 2014-10-09 ENCOUNTER — Encounter: Payer: Self-pay | Admitting: Radiation Oncology

## 2014-10-09 VITALS — BP 134/76 | HR 99 | Temp 98.0°F | Resp 16 | Ht 66.0 in | Wt 182.9 lb

## 2014-10-09 DIAGNOSIS — C7931 Secondary malignant neoplasm of brain: Secondary | ICD-10-CM

## 2014-10-09 NOTE — Progress Notes (Signed)
Radiation Oncology         (336) [phone] ________________________________  Name: Carol Bond MRN: [id number]  Date: 10/09/2014  DOB: 1956-03-08  Follow-Up Visit Note  CC: [name], MD  [name], *  Diagnosis:   59 year old woman with recurrent brain metastases from metastatic small cell lung cancer with previous whole brain irradiation and stereotactic radiosurgery salvage.  1. 12/18/13 12/31/13 she received radiation to the Whole brain to a total dose of 30 gray in 10 fractions while simultaneously receiving radiation to the Left central chest 30 gray in 10 fractions  2. 06/27/2014 received stereotactic radiosurgery to left parietal 20 mm, Right Parietal 6 mm, and Right Temporal 6 mm target to 20 Gy    ICD-9-CM ICD-10-CM   1. Brain metastases 198.3 C79.31     Interval Since Last Radiation:  4 months  Narrative:  The patient returns today to review the results of her recent brain MRI demonstrating numerous recurrent metastatic deposits.  Currently she is receiving systemic chemotherapy with cisplatin at 30 mg/m and irinotecan 65 mg meter squared given on days 1 and 8 every 3 weeks. Status post 3 cycles.  Her chest CT and abd CT.   Reports constant daily headaches. Reports abdominal pain 6 on a scale of 0-10. Reports abdominal pain began this morning. Reports taking percocet intermittently for headaches and left arm/shoulder pain. Reports occasional nausea but, denies emesis. Reports she occasionally feels off balance and dizzy. Reports she has stopped driving following two episodes of confusion. She goes on to explain she drove to the hospital but, had no idea how she got there and why she was there. Reports taking decadron with chemo only. Denies any seizure activity. Denies difficulty with hand coordination or numbness/weakness of extremities. Denies diplopia or floaters  ALLERGIES:  is allergic to actos and vicodin.  Meds: Current Outpatient Prescriptions    Medication Sig Dispense Refill  . albuterol (PROVENTIL HFA;VENTOLIN HFA) 108 (90 BASE) MCG/ACT inhaler Inhale 2 puffs into the lungs every 6 (six) hours as needed for wheezing or shortness of breath. 1 Inhaler 2  . Canagliflozin (INVOKANA) 300 MG TABS Take 1 tablet by mouth daily before breakfast.    . escitalopram (LEXAPRO) 10 MG tablet Take 10 mg by mouth at bedtime.     Marland Kitchen esomeprazole (NEXIUM) 20 MG capsule Take 20 mg by mouth daily as needed (for acid reflex).    . fenofibrate 160 MG tablet Take 160 mg by mouth at bedtime.     Marland Kitchen glimepiride (AMARYL) 4 MG tablet Take 4 mg by mouth daily with breakfast.    . levothyroxine (SYNTHROID, LEVOTHROID) 150 MCG tablet Take 150 mcg by mouth daily before breakfast.    . LORazepam (ATIVAN) 1 MG tablet Take 0.5-1 mg by mouth 2 (two) times daily.     Marland Kitchen losartan (COZAAR) 50 MG tablet Take 50 mg by mouth at bedtime.     . ondansetron (ZOFRAN) 8 MG tablet Take 8 mg by mouth every 8 (eight) hours as needed for nausea or vomiting.     Marland Kitchen oxyCODONE-acetaminophen (PERCOCET) 5-325 MG per tablet Take 1-2 tablets by mouth every 4 (four) hours as needed. 40 tablet 0  . sitaGLIPtin-metformin (JANUMET) 50-1000 MG per tablet Take 1 tablet by mouth 2 (two) times daily with a meal.    . vitamin B-12 (CYANOCOBALAMIN) 100 MCG tablet Take 100 mcg by mouth daily.     No current facility-administered medications for this encounter.   Facility-Administered Medications Ordered  in Other Encounters  Medication Dose Route Frequency Provider Last Rate Last Dose  . sodium chloride 0.9 % injection 10 mL  10 mL Intracatheter PRN Curt Bears, MD        Physical Findings: The patient is in no acute distress. Patient is alert and oriented.  height is 5\' 6"  (1.676 m) and weight is 182 lb 14.4 oz (82.963 kg). Her oral temperature is 98 F (36.7 C). Her blood pressure is 134/76 and her pulse is 99. Her respiration is 16 and oxygen saturation is 99%. .  No significant changes.  Lab  Findings: Lab Results  Component Value Date   WBC 2.6* 10/08/2014   HGB 10.2* 10/08/2014   HCT 31.3* 10/08/2014   MCV 100.0 10/08/2014   PLT 114* 10/08/2014    @LASTCHEM @  Radiographic Findings: Mr Kizzie Fantasia Contrast  10/03/2014   CLINICAL DATA:  Three-month restaging for stereotactic radio surgery. History of lung cancer.  EXAM: MRI HEAD WITHOUT AND WITH CONTRAST  TECHNIQUE: Multiplanar, multiecho pulse sequences of the brain and surrounding structures were obtained without and with intravenous contrast.  CONTRAST:  69mL MULTIHANCE GADOBENATE DIMEGLUMINE 529 MG/ML IV SOLN  COMPARISON:  10/13/2013  FINDINGS: Calvarium and upper cervical spine: No marrow signal abnormality to suggest osseous metastasis. There is a long-standing osseous excrescence from the C1 left posterior arch which deforms the posterior left cord. Stability is confirmed on cervical spine myelogram from 2006.  Orbits: No significant findings.  Sinuses: Clear. Mastoid and middle ears are clear.  Brain: There are numerous enhancing brain metastases, most of which are new. New lesions:  1. Peripheral left cerebellum on image 44 at 5 mm. 2. Right pons on image 56, 3 mm. 3. Left pons on image 59, 4 mm. 4. Left occipito temporal cortex on image number 70. There are 3 clustered metastases in this region, the largest measuring up to 5 mm. 5. Posterior left temporal lobe on image 59, 5 mm. 6. Left occipital cortex on image 119, 3 mm. 7. Left frontal cortex on image 117. 8. High left frontal cortex on image 136, punctate in size. 9. Cluster of metastases in the high right frontal cortex on images 124-129, the largest measuring 4 mm.  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  Previously noted enhancing metastases have faded.  Similar appearance of treatment related white matter signal abnormality throughout the bilateral cerebral hemispheres.  No acute infarct, hemorrhage, or hydrocephalus.  IMPRESSION: 1. Nine  parenchymal brain metastases, most or all new from 08/12/2014. 2. Diffuse treatment related white matter disease.   Electronically Signed   By: Jorje Guild M.D.   On: 10/03/2014 10:36    Impression:  The patient has recurrent brain mets with previous whole brain irradiation.  Her performance status is excellent with KPS = 80-90 and extracranial disease is controlled.  Plan:  We discussed hospice vs re-irradiation, and we agreed that re-irradiation is preferable.  We discussed re-irradiation in some detail and will proceed.  _____________________________________  [name] Tammi Klippel, M.D.

## 2014-10-09 NOTE — Progress Notes (Addendum)
Reports constant daily headaches. Reports abdominal pain 6 on a scale of 0-10. Reports abdominal pain began this morning. Reports taking percocet intermittently for headaches and left arm/shoulder pain. Reports occasional nausea but, denies emesis. Reports she occasionally feels off balance and dizzy. Reports she has stopped driving following two episodes of confusion. She goes on to explain she drove to the hospital but, had no idea how she got there and why she was there. Reports taking decadron with chemo only. Denies any seizure activity. Denies difficulty with hand coordination or numbness/weakness of extremities. Denies diplopia or floaters.

## 2014-10-09 NOTE — Progress Notes (Signed)
See progress note under physician encounter. 

## 2014-10-13 ENCOUNTER — Encounter: Payer: Self-pay | Admitting: Internal Medicine

## 2014-10-13 ENCOUNTER — Ambulatory Visit
Admission: RE | Admit: 2014-10-13 | Discharge: 2014-10-13 | Disposition: A | Discharge status: Home / Self Care | Payer: 59 | Source: Ambulatory Visit | Attending: Radiation Oncology | Admitting: Radiation Oncology

## 2014-10-13 DIAGNOSIS — C7931 Secondary malignant neoplasm of brain: Secondary | ICD-10-CM | POA: Diagnosis not present

## 2014-10-13 NOTE — Progress Notes (Signed)
Put fmla/disability form on nurse's desk

## 2014-10-14 ENCOUNTER — Ambulatory Visit
Admission: RE | Admit: 2014-10-14 | Discharge: 2014-10-14 | Disposition: A | Discharge status: Home / Self Care | Payer: 59 | Source: Ambulatory Visit | Attending: Radiation Oncology | Admitting: Radiation Oncology

## 2014-10-14 ENCOUNTER — Telehealth: Payer: Self-pay | Admitting: Medical Oncology

## 2014-10-14 DIAGNOSIS — C7931 Secondary malignant neoplasm of brain: Secondary | ICD-10-CM | POA: Diagnosis not present

## 2014-10-14 NOTE — Telephone Encounter (Signed)
FMLA papers placed in [employer]. I called pt and left message to call me back about her return to work form that I have .

## 2014-10-15 ENCOUNTER — Ambulatory Visit
Admission: RE | Admit: 2014-10-15 | Discharge: 2014-10-15 | Disposition: A | Discharge status: Home / Self Care | Payer: 59 | Source: Ambulatory Visit | Attending: Radiation Oncology | Admitting: Radiation Oncology

## 2014-10-15 ENCOUNTER — Other Ambulatory Visit (HOSPITAL_BASED_OUTPATIENT_CLINIC_OR_DEPARTMENT_OTHER): Payer: 59

## 2014-10-15 DIAGNOSIS — C7931 Secondary malignant neoplasm of brain: Secondary | ICD-10-CM

## 2014-10-15 DIAGNOSIS — C3412 Malignant neoplasm of upper lobe, left bronchus or lung: Secondary | ICD-10-CM

## 2014-10-15 LAB — CBC WITH DIFFERENTIAL/PLATELET
BASO%: 0.5 % (ref 0.0–2.0)
BASOS ABS: 0 10*3/uL (ref 0.0–0.1)
EOS%: 0.4 % (ref 0.0–7.0)
Eosinophils Absolute: 0 10*3/uL (ref 0.0–0.5)
HCT: 30.2 % — ABNORMAL LOW (ref 34.8–46.6)
HEMOGLOBIN: 9.9 g/dL — AB (ref 11.6–15.9)
LYMPH%: 10.9 % — ABNORMAL LOW (ref 14.0–49.7)
MCH: 32.9 pg (ref 25.1–34.0)
MCHC: 32.7 g/dL (ref 31.5–36.0)
MCV: 100.6 fL (ref 79.5–101.0)
MONO#: 0.8 10*3/uL (ref 0.1–0.9)
MONO%: 12.9 % (ref 0.0–14.0)
NEUT#: 4.5 10*3/uL (ref 1.5–6.5)
NEUT%: 75.3 % (ref 38.4–76.8)
Platelets: 118 10*3/uL — ABNORMAL LOW (ref 145–400)
RBC: 3 10*6/uL — AB (ref 3.70–5.45)
RDW: 21.8 % — AB (ref 11.2–14.5)
WBC: 6 10*3/uL (ref 3.9–10.3)
lymph#: 0.7 10*3/uL — ABNORMAL LOW (ref 0.9–3.3)

## 2014-10-15 LAB — COMPREHENSIVE METABOLIC PANEL (CC13)
ALK PHOS: 83 U/L (ref 40–150)
ALT: 53 U/L (ref 0–55)
AST: 42 U/L — AB (ref 5–34)
Albumin: 4.3 g/dL (ref 3.5–5.0)
Anion Gap: 10 mEq/L (ref 3–11)
BILIRUBIN TOTAL: 0.46 mg/dL (ref 0.20–1.20)
BUN: 11.8 mg/dL (ref 7.0–26.0)
CO2: 27 mEq/L (ref 22–29)
Calcium: 9.9 mg/dL (ref 8.4–10.4)
Chloride: 104 mEq/L (ref 98–109)
Creatinine: 0.7 mg/dL (ref 0.6–1.1)
EGFR: >90 mL/min/{1.73_m2} (ref >90)
Glucose: 142 mg/dl — ABNORMAL HIGH (ref 70–140)
POTASSIUM: 4.2 meq/L (ref 3.5–5.1)
SODIUM: 142 meq/L (ref 136–145)
TOTAL PROTEIN: 7 g/dL (ref 6.4–8.3)

## 2014-10-15 LAB — MAGNESIUM (CC13): Magnesium: 2.5 mg/dl (ref 1.5–2.5)

## 2014-10-15 NOTE — Telephone Encounter (Signed)
[name] pt stated she is not planning to return to work. I put return to work form in [employer].

## 2014-10-16 ENCOUNTER — Ambulatory Visit
Admission: RE | Admit: 2014-10-16 | Discharge: 2014-10-16 | Disposition: A | Discharge status: Home / Self Care | Payer: 59 | Source: Ambulatory Visit | Attending: Radiation Oncology | Admitting: Radiation Oncology

## 2014-10-16 DIAGNOSIS — C7931 Secondary malignant neoplasm of brain: Secondary | ICD-10-CM | POA: Diagnosis not present

## 2014-10-16 NOTE — Progress Notes (Signed)
  Radiation Oncology         [phone] ________________________________  Name: Carol Bond MRN: [id number]  Date: 10/17/2014  DOB: 1956-01-25  Weekly Radiation Therapy Management    ICD-9-CM ICD-10-CM   1. Primary cancer of left upper lobe of lung 162.3 C34.12   2. Brain metastases 198.3 C79.31     Current Dose: 12.5 Gy     Planned Dose:  30 Gy  Narrative . . . . . . . . The patient presents for routine under treatment assessment.                                   Weight and vitals stable. Denies pain. Reports dizziness, nausea and vomiting this week. Has a tooth abscess and hoping for the "OK" to have it pulled. Reports she occasionally feels unsteady on her feet. Reports abdominal pain has resolved. Denies seizures. Denies taking decadron                                 Set-up films were reviewed.                                 The chart was checked. Physical Findings. . .  weight is 181 lb (82.101 kg). Her blood pressure is 118/65 and her pulse is 88. Her respiration is 16. . Weight essentially stable.  No significant changes. Impression . . . . . . . The patient is tolerating radiation. Plan . . . . . . . . . . . . Continue treatment as planned.  ________________________________  [name]. Tammi Klippel, M.D.

## 2014-10-17 ENCOUNTER — Ambulatory Visit
Admission: RE | Admit: 2014-10-17 | Discharge: 2014-10-17 | Disposition: A | Discharge status: Home / Self Care | Payer: 59 | Source: Ambulatory Visit | Attending: Radiation Oncology | Admitting: Radiation Oncology

## 2014-10-17 ENCOUNTER — Telehealth: Payer: Self-pay | Admitting: *Deleted

## 2014-10-17 ENCOUNTER — Encounter: Payer: Self-pay | Admitting: Radiation Oncology

## 2014-10-17 VITALS — BP 118/65 | HR 88 | Resp 16 | Wt 181.0 lb

## 2014-10-17 DIAGNOSIS — C7931 Secondary malignant neoplasm of brain: Secondary | ICD-10-CM

## 2014-10-17 DIAGNOSIS — C3412 Malignant neoplasm of upper lobe, left bronchus or lung: Secondary | ICD-10-CM

## 2014-10-17 MED ORDER — AMOXICILLIN-POT CLAVULANATE 875-125 MG PO TABS: 1.0000 | ORAL_TABLET | Freq: Two times a day (BID) | ORAL | Status: DC

## 2014-10-17 NOTE — Telephone Encounter (Signed)
CALLED PATIENT TO INFORM OF TEST FOR 10/23/14, LVM FOR A RETURN CALL

## 2014-10-17 NOTE — Progress Notes (Signed)
Weight and vitals stable. Denies pain. Reports dizziness, nausea and vomiting this week. Has a tooth abscess and hoping for the "OK" to have it pulled. Reports she occasionally feels unsteady on her feet. Reports abdominal pain has resolved. Denies seizures. Denies taking decadron.

## 2014-10-17 NOTE — Progress Notes (Signed)
  Radiation Oncology         [phone] ________________________________  Name: Carol Bond MRN: [id number]  Date: 10/17/2014  DOB: 1955/11/15  To Whom It May Concern:  This patient is receiving radiation to the brain without exposure to the oral cavity.  She seems to have a dental abscess.  She is cleared to have oral surgery if needed.  Call [phone] if questions    Rodman Key A. Tammi Klippel, M.D.

## 2014-10-20 ENCOUNTER — Ambulatory Visit
Admission: RE | Admit: 2014-10-20 | Discharge: 2014-10-20 | Disposition: A | Discharge status: Home / Self Care | Payer: 59 | Source: Ambulatory Visit | Attending: Radiation Oncology | Admitting: Radiation Oncology

## 2014-10-20 ENCOUNTER — Ambulatory Visit (HOSPITAL_COMMUNITY)
Admission: RE | Admit: 2014-10-20 | Discharge: 2014-10-20 | Disposition: A | Discharge status: Home / Self Care | Payer: 59 | Source: Ambulatory Visit | Attending: Physician Assistant | Admitting: Physician Assistant

## 2014-10-20 ENCOUNTER — Ambulatory Visit (HOSPITAL_COMMUNITY): Payer: 59

## 2014-10-20 ENCOUNTER — Encounter (HOSPITAL_COMMUNITY): Payer: Self-pay

## 2014-10-20 DIAGNOSIS — Z923 Personal history of irradiation: Secondary | ICD-10-CM | POA: Diagnosis not present

## 2014-10-20 DIAGNOSIS — C3412 Malignant neoplasm of upper lobe, left bronchus or lung: Secondary | ICD-10-CM

## 2014-10-20 DIAGNOSIS — C7931 Secondary malignant neoplasm of brain: Secondary | ICD-10-CM | POA: Diagnosis not present

## 2014-10-20 DIAGNOSIS — C3492 Malignant neoplasm of unspecified part of left bronchus or lung: Secondary | ICD-10-CM | POA: Diagnosis not present

## 2014-10-20 DIAGNOSIS — Z9221 Personal history of antineoplastic chemotherapy: Secondary | ICD-10-CM | POA: Diagnosis not present

## 2014-10-20 DIAGNOSIS — Z08 Encounter for follow-up examination after completed treatment for malignant neoplasm: Secondary | ICD-10-CM | POA: Insufficient documentation

## 2014-10-20 DIAGNOSIS — J90 Pleural effusion, not elsewhere classified: Secondary | ICD-10-CM | POA: Diagnosis not present

## 2014-10-20 DIAGNOSIS — R599 Enlarged lymph nodes, unspecified: Secondary | ICD-10-CM | POA: Insufficient documentation

## 2014-10-20 DIAGNOSIS — E279 Disorder of adrenal gland, unspecified: Secondary | ICD-10-CM | POA: Diagnosis not present

## 2014-10-20 MED ORDER — IOHEXOL 300 MG/ML  SOLN
100.0000 mL | Freq: Once | INTRAMUSCULAR | Status: AC | PRN
  Administered 2014-10-20: 100 mL via INTRAVENOUS

## 2014-10-21 ENCOUNTER — Other Ambulatory Visit (HOSPITAL_BASED_OUTPATIENT_CLINIC_OR_DEPARTMENT_OTHER): Payer: 59

## 2014-10-21 ENCOUNTER — Encounter: Payer: Self-pay | Admitting: Physician Assistant

## 2014-10-21 ENCOUNTER — Ambulatory Visit
Admission: RE | Admit: 2014-10-21 | Discharge: 2014-10-21 | Disposition: A | Discharge status: Home / Self Care | Payer: 59 | Source: Ambulatory Visit | Attending: Radiation Oncology | Admitting: Radiation Oncology

## 2014-10-21 ENCOUNTER — Ambulatory Visit (HOSPITAL_BASED_OUTPATIENT_CLINIC_OR_DEPARTMENT_OTHER): Payer: 59 | Admitting: Physician Assistant

## 2014-10-21 VITALS — BP 118/64 | HR 98 | Temp 97.8°F | Resp 18 | Ht 66.0 in | Wt 179.7 lb

## 2014-10-21 DIAGNOSIS — C3412 Malignant neoplasm of upper lobe, left bronchus or lung: Secondary | ICD-10-CM

## 2014-10-21 DIAGNOSIS — C7931 Secondary malignant neoplasm of brain: Secondary | ICD-10-CM

## 2014-10-21 DIAGNOSIS — C3490 Malignant neoplasm of unspecified part of unspecified bronchus or lung: Secondary | ICD-10-CM

## 2014-10-21 DIAGNOSIS — D709 Neutropenia, unspecified: Secondary | ICD-10-CM

## 2014-10-21 LAB — COMPREHENSIVE METABOLIC PANEL (CC13)
ALBUMIN: 4.1 g/dL (ref 3.5–5.0)
ALT: 41 U/L (ref 0–55)
AST: 26 U/L (ref 5–34)
Alkaline Phosphatase: 67 U/L (ref 40–150)
Anion Gap: 10 mEq/L (ref 3–11)
BUN: 13.1 mg/dL (ref 7.0–26.0)
CALCIUM: 10 mg/dL (ref 8.4–10.4)
CHLORIDE: 108 meq/L (ref 98–109)
CO2: 27 meq/L (ref 22–29)
Creatinine: 0.8 mg/dL (ref 0.6–1.1)
EGFR: 89 mL/min/{1.73_m2} — ABNORMAL LOW (ref >90)
Glucose: 193 mg/dl — ABNORMAL HIGH (ref 70–140)
POTASSIUM: 4.6 meq/L (ref 3.5–5.1)
Sodium: 144 mEq/L (ref 136–145)
TOTAL PROTEIN: 7 g/dL (ref 6.4–8.3)
Total Bilirubin: 0.38 mg/dL (ref 0.20–1.20)

## 2014-10-21 LAB — CBC WITH DIFFERENTIAL/PLATELET
BASO%: 0.3 % (ref 0.0–2.0)
Basophils Absolute: 0 10*3/uL (ref 0.0–0.1)
EOS%: 0.7 % (ref 0.0–7.0)
Eosinophils Absolute: 0 10*3/uL (ref 0.0–0.5)
HCT: 29.6 % — ABNORMAL LOW (ref 34.8–46.6)
HGB: 9.8 g/dL — ABNORMAL LOW (ref 11.6–15.9)
LYMPH#: 0.7 10*3/uL — AB (ref 0.9–3.3)
LYMPH%: 22.3 % (ref 14.0–49.7)
MCH: 34 pg (ref 25.1–34.0)
MCHC: 33.1 g/dL (ref 31.5–36.0)
MCV: 102.8 fL — ABNORMAL HIGH (ref 79.5–101.0)
MONO#: 0.3 10*3/uL (ref 0.1–0.9)
MONO%: 11.1 % (ref 0.0–14.0)
NEUT#: 1.9 10*3/uL (ref 1.5–6.5)
NEUT%: 65.6 % (ref 38.4–76.8)
Platelets: 92 10*3/uL — ABNORMAL LOW (ref 145–400)
RBC: 2.88 10*6/uL — ABNORMAL LOW (ref 3.70–5.45)
RDW: 19.3 % — AB (ref 11.2–14.5)
WBC: 3 10*3/uL — AB (ref 3.9–10.3)

## 2014-10-21 LAB — MAGNESIUM (CC13): Magnesium: 2.1 mg/dl (ref 1.5–2.5)

## 2014-10-21 NOTE — Progress Notes (Addendum)
[place] [phone]   Fax:(336) [place], [place], [address] [place] [address]  DIAGNOSIS: Lung cancer  Primary site: Lung (Left)  Staging method: AJCC 7th Edition  Clinical free text: Extensive stage small cell lung cancer  Clinical: Stage IV (T3, N2, M1b) signed by Curt Bears, MD on 12/17/2013 7:45 PM  Summary: Stage IV (T3, N2, M1b)   PRIOR THERAPY: 1) Whole brain irradiation under the care of Dr. Sondra Come. 2) Systemic chemotherapy with carboplatin for an AUC of 5 given on day 1, etoposide at 120 mg per meter square given on days 1, 2 and 3 with Neulasta support given on day 4. Status post 6 cycles.  CURRENT THERAPY: Systemic chemotherapy with cisplatin at 30 mg/m and irinotecan 65 mg meter squared given on days 1 and 8 every 3 weeks. Status post 4 cycles.  DISEASE STAGE: Lung cancer  Primary site: Lung (Left)  Staging method: AJCC 7th Edition  Clinical free text: Extensive stage small cell lung cancer  Clinical: Stage IV (T3, N2, M1b) signed by Curt Bears, MD on 12/17/2013 7:45 PM  Summary: Stage IV (T3, N2, M1b)  CHEMOTHERAPY INTENT: Palliative  CURRENT # OF CHEMOTHERAPY CYCLES: 4 CURRENT ANTIEMETICS: none  CURRENT SMOKING STATUS: Former smoker, quit July 2014  ORAL CHEMOTHERAPY AND CONSENT: n/a  CURRENT BISPHOSPHONATES USE: none  PAIN MANAGEMENT: oxycodone  NARCOTICS INDUCED CONSTIPATION: none  LIVING WILL AND CODE STATUS:    INTERVAL HISTORY: Carol Bond 59 y.o. female returns to the clinic today for followup visit accompanied by her husband and 2 daughters. The patient is currently undergoing second line systemic chemotherapy with reduced dose cisplatin and irinotecan status post 4 cycles. She tolerated the previous 4 cycles of her treatment fairly well except for neutropenia that requires Granix injection few times recently. She also has had some diarrhea  usually occurring about 3-4 days after chemotherapy and also causing some anal irritation with a little bit of bright red blood on the tissue when she wipes. She denies any blood contained in the diarrhea or in the commode. She continues to complain of upper back pain between her shoulder blades. She has intermittent left-sided chest pain but no significant shortness of breath, cough or hemoptysis. She denied having any significant nausea or vomiting, no fever or chills. The patient denied having any significant weight loss or night sweats.  She reports that she has a tooth filled. She recently had a restaging CT scan of her chest, abdomen and pelvis to re-evaluate her disease.  MEDICAL HISTORY: Past Medical History  Diagnosis Date  . Pulmonary embolism   . Hypothyroid   . Radiation 12/18/13-12/31/13    whole brain 30 gray, left central chest 30 gray  . 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 Brain cancer   . Diabetes     metformin    ALLERGIES:  is allergic to actos and vicodin.  MEDICATIONS:  Current Outpatient Prescriptions  Medication Sig Dispense Refill  . albuterol (PROVENTIL HFA;VENTOLIN HFA) 108 (90 BASE) MCG/ACT inhaler Inhale 2 puffs into the lungs every 6 (six) hours as needed for wheezing or shortness of breath. 1 Inhaler 2  . amoxicillin-clavulanate (AUGMENTIN) 875-125 MG per tablet Take 1 tablet by mouth 2 (two) times daily. 14 tablet 0  . Canagliflozin (INVOKANA) 300 MG TABS Take 1 tablet by mouth daily before breakfast.    .  escitalopram (LEXAPRO) 10 MG tablet Take 10 mg by mouth at bedtime.     Marland Kitchen esomeprazole (NEXIUM) 20 MG capsule Take 20 mg by mouth daily as needed (for acid reflex).    . fenofibrate 160 MG tablet Take 160 mg by mouth at bedtime.     Marland Kitchen glimepiride (AMARYL) 4 MG tablet Take 4 mg by mouth daily with breakfast.    . levothyroxine (SYNTHROID, LEVOTHROID) 150 MCG tablet Take 150 mcg by mouth daily before breakfast.    .  LORazepam (ATIVAN) 1 MG tablet Take 0.5-1 mg by mouth 2 (two) times daily.     Marland Kitchen losartan (COZAAR) 50 MG tablet Take 50 mg by mouth at bedtime.     . ondansetron (ZOFRAN) 8 MG tablet Take 8 mg by mouth every 8 (eight) hours as needed for nausea or vomiting.     Marland Kitchen oxyCODONE-acetaminophen (PERCOCET) 5-325 MG per tablet Take 1-2 tablets by mouth every 4 (four) hours as needed. 40 tablet 0  . sitaGLIPtin-metformin (JANUMET) 50-1000 MG per tablet Take 1 tablet by mouth 2 (two) times daily with a meal.    . vitamin B-12 (CYANOCOBALAMIN) 100 MCG tablet Take 100 mcg by mouth daily.     No current facility-administered medications for this visit.   Facility-Administered Medications Ordered in Other Visits  Medication Dose Route Frequency Provider Last Rate Last Dose  . sodium chloride 0.9 % injection 10 mL  10 mL Intracatheter PRN Curt Bears, MD        SURGICAL HISTORY:  Past Surgical History  Procedure Laterality Date  . Abdominal hysterectomy    . Spine surgery    . Cervical fusion    . Video bronchoscopy with endobronchial ultrasound N/A 12/13/2013    Procedure: VIDEO BRONCHOSCOPY WITH ENDOBRONCHIAL ULTRASOUND;  Surgeon: Grace Isaac, MD;  Location: [facility];  Service: Thoracic;  Laterality: N/A;  . Portacath placement Left 12/13/2013    Procedure: INSERTION PORT-A-CATH;  Surgeon: Grace Isaac, MD;  Location: [facility];  Service: Thoracic;  Laterality: Left;    REVIEW OF SYSTEMS:  Constitutional: positive for fatigue Eyes: negative Ears, nose, mouth, throat, and face: negative Respiratory: positive for pleurisy/chest pain Cardiovascular: negative Gastrointestinal: negative Genitourinary:negative Integument/breast: negative Hematologic/lymphatic: negative Musculoskeletal:positive for arthralgias and bone pain Neurological: positive for weakness Behavioral/Psych: negative Endocrine: negative Allergic/Immunologic: negative   PHYSICAL EXAMINATION: General appearance: alert,  cooperative, fatigued and no distress Head: Normocephalic, without obvious abnormality, atraumatic Neck: no adenopathy, no JVD, supple, symmetrical, trachea midline and thyroid not enlarged, symmetric, no tenderness/mass/nodules Lymph nodes: Cervical, supraclavicular, and axillary nodes normal. Resp: clear to auscultation bilaterally Back: symmetric, no curvature. ROM normal. No CVA tenderness. Cardio: regular rate and rhythm, S1, S2 normal, no murmur, click, rub or gallop GI: soft, non-tender; bowel sounds normal; no masses,  no organomegaly Extremities: extremities normal, atraumatic, no cyanosis or edema Neurologic: Alert and oriented X 3, normal strength and tone. Normal symmetric reflexes. Normal coordination and gait  ECOG PERFORMANCE STATUS: 1 - Symptomatic but completely ambulatory  Blood pressure 118/64, pulse 98, temperature 97.8 F (36.6 C), temperature source Oral, resp. rate 18, height 5\' 6"  (1.676 m), weight 179 lb 11.2 oz (81.511 kg), SpO2 98 %.  LABORATORY DATA: Lab Results  Component Value Date   WBC 3.0* 10/21/2014   HGB 9.8* 10/21/2014   HCT 29.6* 10/21/2014   MCV 102.8* 10/21/2014   PLT 92* 10/21/2014      Chemistry      Component Value Date/Time   NA 144  10/21/2014 1448   NA 137 07/11/2014 0840   K 4.6 10/21/2014 1448   K 4.1 07/11/2014 0840   CL 97 07/11/2014 0840   CO2 27 10/21/2014 1448   CO2 24 07/11/2014 0840   BUN 13.1 10/21/2014 1448   BUN 15 07/11/2014 0840   CREATININE 0.8 10/21/2014 1448   CREATININE 0.60 07/11/2014 0840      Component Value Date/Time   CALCIUM 10.0 10/21/2014 1448   CALCIUM 10.3 07/11/2014 0840   ALKPHOS 67 10/21/2014 1448   ALKPHOS 54 12/19/2013 0320   AST 26 10/21/2014 1448   AST 24 12/19/2013 0320   ALT 41 10/21/2014 1448   ALT 48* 12/19/2013 0320   BILITOT 0.38 10/21/2014 1448   BILITOT <0.2* 12/19/2013 0320       RADIOGRAPHIC STUDIES: Ct Chest W Contrast  10/20/2014   CLINICAL DATA:  Sml cell lung ca dx;d  4/15 with brain mets, ongoing chemo & brain XRT, prev lung XRT,,,pt is off balance  EXAM: CT CHEST, ABDOMEN, AND PELVIS WITH CONTRAST  TECHNIQUE: Multidetector CT imaging of the chest, abdomen and pelvis was performed following the standard protocol during bolus administration of intravenous contrast.  CONTRAST:  126mL OMNIPAQUE IOHEXOL 300 MG/ML  SOLN  COMPARISON:  08/26/2014  FINDINGS: CT CHEST FINDINGS  There has been worsening the chest since the prior study.  The irregular mass above the left hilum, which borders on the left pulmonary artery and bulges into the AP window, is larger. It currently measures 5.8 cm x 3.5 cm in greatest transverse dimension along the same axes it measured 5.1 cm x 2.9 cm previously.  An enlarged left superior mediastinal lymph node now measures 17 mm x 17 mm, previously 14 mm x 14 mm. Pleural disease along the left and posterior heart border has worsened. Largest deposit now measures 14 mm in thickness, previously 13 mm. Another deposits posterior to the left ventricle measures 8.6 mm in thickness, previously 6.6 mm. An inferior posterior mediastinal node currently measures 10 mm in short axis, previously 7.6 mm.  There is a new small left pleural effusion.  There are multiple small lung nodules in the left lower lobe which have increased in size and number since the prior study. Largest of these measures between 5 and 6 mm. There is a new small nodule along the peripheral margin of the anteromedial left upper lobe, lingula.  No right lung nodules. No right pleural effusion. Coarse reticular and patchy opacity in the left upper lobe adjacent to the left suprahilar mass is stable, consistent with post treatment related change.  There is a small amount of pericardial fluid, increased from the prior exam.  Heart is normal in size and configuration. Great vessels are normal in caliber. No right hilar masses or adenopathy.  CT ABDOMEN AND PELVIS FINDINGS  13 mm enhancing lesion in the  far lateral margin of the lateral segment of the left liver lobe, at the dome, stable. This is felt to be a benign vascular lesion. Liver is borderline enlarged. There is diffuse fatty infiltration. No other liver lesion.  Spleen, gallbladder and pancreas:  Unremarkable.  Left adrenal mass now measures 3.2 cm x 2.7 cm, previously 2.5 cm x 2 cm. Right adrenal mass also increased in size, measuring 18 mm x 11 mm.  Kidneys, ureters and bladder are unremarkable.  The uterus surgically absent.  No pelvic masses.  No adenopathy.  No ascites.  Stable left colon diverticula. No diverticulitis. Colon otherwise unremarkable. Normal small  bowel.  MUSCULOSKELETAL  No osteoblastic or osteolytic lesions.  IMPRESSION: 1. Worsening metastatic disease from lung carcinoma. 2. In the chest, the primary mass above the left hilum has enlarged. Pleural disease, including that along the left Heart pleural margin, has worsened. There are new and enlarged small pulmonary nodules. Mediastinal adenopathy has increased. New left pleural effusion. 3. In the abdomen pelvis, bilateral adrenal masses have increased in size. No other evidence of metastatic disease within the abdomen or pelvis. 4. No evidence of bony metastatic disease.   Electronically Signed   By: Lajean Manes M.D.   On: 10/20/2014 15:32   Mr Jeri Cos BO Contrast  10/03/2014   CLINICAL DATA:  Three-month restaging for stereotactic radio surgery. History of lung cancer.  EXAM: MRI HEAD WITHOUT AND WITH CONTRAST  TECHNIQUE: Multiplanar, multiecho pulse sequences of the brain and surrounding structures were obtained without and with intravenous contrast.  CONTRAST:  51mL MULTIHANCE GADOBENATE DIMEGLUMINE 529 MG/ML IV SOLN  COMPARISON:  10/13/2013  FINDINGS: Calvarium and upper cervical spine: No marrow signal abnormality to suggest osseous metastasis. There is a long-standing osseous excrescence from the C1 left posterior arch which deforms the posterior left cord. Stability is  confirmed on cervical spine myelogram from 2006.  Orbits: No significant findings.  Sinuses: Clear. Mastoid and middle ears are clear.  Brain: There are numerous enhancing brain metastases, most of which are new. New lesions:  1. Peripheral left cerebellum on image 44 at 5 mm. 2. Right pons on image 56, 3 mm. 3. Left pons on image 59, 4 mm. 4. Left occipito temporal cortex on image number 70. There are 3 clustered metastases in this region, the largest measuring up to 5 mm. 5. Posterior left temporal lobe on image 59, 5 mm. 6. Left occipital cortex on image 119, 3 mm. 7. Left frontal cortex on image 117. 8. High left frontal cortex on image 136, punctate in size. 9. Cluster of metastases in the high right frontal cortex on images 124-129, the largest measuring 4 mm.  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  Previously noted enhancing metastases have faded.  Similar appearance of treatment related white matter signal abnormality throughout the bilateral cerebral hemispheres.  No acute infarct, hemorrhage, or hydrocephalus.  IMPRESSION: 1. Nine parenchymal brain metastases, most or all new from 08/12/2014. 2. Diffuse treatment related white matter disease.   Electronically Signed   By: Jorje Guild M.D.   On: 10/03/2014 10:36   Ct Abdomen Pelvis W Contrast  10/20/2014   CLINICAL DATA:  Sml cell lung ca dx;d 4/15 with brain mets, ongoing chemo & brain XRT, prev lung XRT,,,pt is off balance  EXAM: CT CHEST, ABDOMEN, AND PELVIS WITH CONTRAST  TECHNIQUE: Multidetector CT imaging of the chest, abdomen and pelvis was performed following the standard protocol during bolus administration of intravenous contrast.  CONTRAST:  161mL OMNIPAQUE IOHEXOL 300 MG/ML  SOLN  COMPARISON:  08/26/2014  FINDINGS: CT CHEST FINDINGS  There has been worsening the chest since the prior study.  The irregular mass above the left hilum, which borders on the left pulmonary artery and bulges into the AP window, is  larger. It currently measures 5.8 cm x 3.5 cm in greatest transverse dimension along the same axes it measured 5.1 cm x 2.9 cm previously.  An enlarged left superior mediastinal lymph node now measures 17 mm x 17 mm, previously 14 mm x 14 mm. Pleural disease along the left and posterior heart border  has worsened. Largest deposit now measures 14 mm in thickness, previously 13 mm. Another deposits posterior to the left ventricle measures 8.6 mm in thickness, previously 6.6 mm. An inferior posterior mediastinal node currently measures 10 mm in short axis, previously 7.6 mm.  There is a new small left pleural effusion.  There are multiple small lung nodules in the left lower lobe which have increased in size and number since the prior study. Largest of these measures between 5 and 6 mm. There is a new small nodule along the peripheral margin of the anteromedial left upper lobe, lingula.  No right lung nodules. No right pleural effusion. Coarse reticular and patchy opacity in the left upper lobe adjacent to the left suprahilar mass is stable, consistent with post treatment related change.  There is a small amount of pericardial fluid, increased from the prior exam.  Heart is normal in size and configuration. Great vessels are normal in caliber. No right hilar masses or adenopathy.  CT ABDOMEN AND PELVIS FINDINGS  13 mm enhancing lesion in the far lateral margin of the lateral segment of the left liver lobe, at the dome, stable. This is felt to be a benign vascular lesion. Liver is borderline enlarged. There is diffuse fatty infiltration. No other liver lesion.  Spleen, gallbladder and pancreas:  Unremarkable.  Left adrenal mass now measures 3.2 cm x 2.7 cm, previously 2.5 cm x 2 cm. Right adrenal mass also increased in size, measuring 18 mm x 11 mm.  Kidneys, ureters and bladder are unremarkable.  The uterus surgically absent.  No pelvic masses.  No adenopathy.  No ascites.  Stable left colon diverticula. No  diverticulitis. Colon otherwise unremarkable. Normal small bowel.  MUSCULOSKELETAL  No osteoblastic or osteolytic lesions.  IMPRESSION: 1. Worsening metastatic disease from lung carcinoma. 2. In the chest, the primary mass above the left hilum has enlarged. Pleural disease, including that along the left Heart pleural margin, has worsened. There are new and enlarged small pulmonary nodules. Mediastinal adenopathy has increased. New left pleural effusion. 3. In the abdomen pelvis, bilateral adrenal masses have increased in size. No other evidence of metastatic disease within the abdomen or pelvis. 4. No evidence of bony metastatic disease.   Electronically Signed   By: Lajean Manes M.D.   On: 10/20/2014 15:32    ASSESSMENT AND PLAN: This is a very pleasant 59 years old white female recently diagnosed with extensive stage small cell lung cancer status post whole brain irradiation in addition to 6 cycles of systemic chemotherapy with carboplatin and etoposide with significant improvement in her disease but unfortunately a few months later she started having evidence for disease progression. The patient is currently undergoing second line chemotherapy with cisplatin and irinotecan status post 4 cycles. She is tolerating her treatment fairly well except for neutropenia. The last CT scan of the chest, abdomen and pelvis by report showed slight disease progression but on reviewing the images by myself and with the patient and her family and Dr. Julien Nordmann did not see a significant difference compared to the previous CT scans performed 6 weeks ago and there could be actually some mild improvement. The patient was discussed with and also seen by Dr. Julien Nordmann. Her most recent restaging CT scan revealed evidence for disease progression. Dr. Julien Nordmann discussed the option of hospice/palliative care versus Temodar. The patient would like to give Temodar a try. A prescription for Temodar will be sent to the [facility].  She will follow up  in 3 weeks for a symptom management visit. She may get her tooth pulled in the interim if necessary.She was advised to call immediately if she has any concerning symptoms in the interval.  She was advised to call immediately if she has any concerning symptoms in the interval. The patient voices understanding of current disease status and treatment options and is in agreement with the current care plan.  All questions were answered. The patient knows to call the clinic with any problems, questions or concerns. We can certainly see the patient much sooner if necessary.  Carlton Adam, PA-C 10/21/2014  ADDENDUM: Hematology/Oncology Attending: I had a face to face encounter with the patient. I recommended her care plan. This is a very pleasant 59 years old white female with extensive stage small cell lung cancer status post first line chemotherapy with carboplatin and etoposide followed by second line chemotherapy with cisplatin and irinotecan but unfortunately the patient has evidence for disease progression on his recent scan. She is also currently undergoing whole brain irradiation for progressive brain metastasis. I had a lengthy discussion with the patient and her husband as well as the 2 daughters about her current condition and treatment options. I showed her the images of the restaging scans of the chest, abdomen and pelvis. I discussed with the patient several options for treatment of her condition including palliative care and hospice referral versus consideration of treatment with single agent chemotherapy with paclitaxel or gemcitabine versus treatment with oral Temodar. After discussion of all the options and the adverse effect The patient would like to proceed with treatment with Temodar which is a very reasonable option taken into consideration her progressive brain metastasis and the ability of Temodar to cross the blood-brain barrier. She is  expected to start the first dose of Temodar after completion of her radiation therapy next week. For PCP prophylaxis, we'll start the patient on Bactrim DS on Mondays and Thursdays. She would come back for follow-up visit in 3 weeks for reevaluation and repeat blood work as well as management of any adverse effect of her treatment. The patient was advised to call immediately if she has any concerning symptoms in the interval.  Disclaimer: This note was dictated with voice recognition software. Similar sounding words can inadvertently be transcribed and may be missed upon review. Eilleen Kempf., MD 10/25/2014

## 2014-10-22 ENCOUNTER — Ambulatory Visit
Admission: RE | Admit: 2014-10-22 | Discharge: 2014-10-22 | Disposition: A | Discharge status: Home / Self Care | Payer: 59 | Source: Ambulatory Visit | Attending: Radiation Oncology | Admitting: Radiation Oncology

## 2014-10-22 ENCOUNTER — Ambulatory Visit: Payer: 59

## 2014-10-22 DIAGNOSIS — C7931 Secondary malignant neoplasm of brain: Secondary | ICD-10-CM | POA: Diagnosis not present

## 2014-10-23 ENCOUNTER — Ambulatory Visit
Admission: RE | Admit: 2014-10-23 | Discharge: 2014-10-23 | Disposition: A | Discharge status: Home / Self Care | Payer: 59 | Source: Ambulatory Visit | Attending: Radiation Oncology | Admitting: Radiation Oncology

## 2014-10-23 ENCOUNTER — Encounter: Payer: Self-pay | Admitting: Radiation Oncology

## 2014-10-23 VITALS — BP 121/77 | HR 119 | Resp 16 | Wt 178.8 lb

## 2014-10-23 DIAGNOSIS — C7931 Secondary malignant neoplasm of brain: Secondary | ICD-10-CM | POA: Diagnosis not present

## 2014-10-23 NOTE — Progress Notes (Signed)
  Radiation Oncology         (336) [phone] ________________________________  Name: Carol Bond MRN: [id number]  Date: 10/23/2014  DOB: 01-Oct-1955  Weekly Radiation Therapy Management    ICD-9-CM ICD-10-CM   1. Brain metastases 198.3 C79.31     Current Dose: 22.5 Gy     Planned Dose:  30 Gy  Narrative . . . . . . . . The patient presents for routine under treatment assessment.                                   Seen by Dr. Julien Nordmann this week and understands cancer has "spread and is growing." Reports left should and arm pain 6 on a scale of 0-10. Reports that she takes aleve or 1/2 a percocet then, applies head to relieve this pain. Reports increased severity and frequency of headaches. Reports a headache woke her up this morning. Reports dizziness and unsteady gait. Reports blurry vision. Denies nausea, vomiting or ringing in the ears.                                  Set-up films were reviewed.                                 The chart was checked. Physical Findings. . .  weight is 178 lb 12.8 oz (81.103 kg). Her blood pressure is 121/77 and her pulse is 119. Her respiration is 16 and oxygen saturation is 100%. . Weight essentially stable.  No significant changes. Impression . . . . . . . The patient is tolerating radiation. Plan . . . . . . . . . . . . Continue treatment as planned.  ________________________________  [name]. Tammi Klippel, M.D.

## 2014-10-23 NOTE — Progress Notes (Signed)
Seen by Dr. Julien Nordmann this week and understands cancer has "spread and is growing." Reports left should and arm pain 6 on a scale of 0-10. Reports that she takes aleve or 1/2 a percocet then, applies head to relieve this pain. Reports increased severity and frequency of headaches. Reports a headache woke her up this morning. Reports dizziness and unsteady gait. Reports blurry vision. Denies nausea, vomiting or ringing in the ears.

## 2014-10-24 ENCOUNTER — Ambulatory Visit
Admission: RE | Admit: 2014-10-24 | Discharge: 2014-10-24 | Disposition: A | Discharge status: Home / Self Care | Payer: 59 | Source: Ambulatory Visit | Attending: Radiation Oncology | Admitting: Radiation Oncology

## 2014-10-24 DIAGNOSIS — C7931 Secondary malignant neoplasm of brain: Secondary | ICD-10-CM | POA: Diagnosis not present

## 2014-10-24 NOTE — Patient Instructions (Signed)
Your CT scan showed evidence for disease progression You will start treatment with Temodar Follow up in 3 weeks

## 2014-10-25 MED ORDER — TEMOZOLOMIDE 180 MG PO CAPS: 200.0000 mg/m2/d | ORAL_CAPSULE | Freq: Every day | ORAL | Status: AC

## 2014-10-25 MED ORDER — SULFAMETHOXAZOLE-TRIMETHOPRIM 800-160 MG PO TABS: ORAL_TABLET | ORAL | Status: DC

## 2014-10-25 NOTE — Addendum Note (Signed)
Addended by: Curt Bears on: 10/25/2014 03:57 PM   Modules accepted: Orders

## 2014-10-27 ENCOUNTER — Telehealth: Payer: Self-pay | Admitting: Internal Medicine

## 2014-10-27 ENCOUNTER — Ambulatory Visit
Admission: RE | Admit: 2014-10-27 | Discharge: 2014-10-27 | Disposition: A | Discharge status: Home / Self Care | Payer: 59 | Source: Ambulatory Visit | Attending: Radiation Oncology | Admitting: Radiation Oncology

## 2014-10-27 DIAGNOSIS — C7931 Secondary malignant neoplasm of brain: Secondary | ICD-10-CM | POA: Diagnosis not present

## 2014-10-27 NOTE — Telephone Encounter (Signed)
Pt came in to r/s lab to same day as mri,done and calendar prnted  anne

## 2014-10-28 ENCOUNTER — Telehealth: Payer: Self-pay | Admitting: Medical Oncology

## 2014-10-28 ENCOUNTER — Ambulatory Visit
Admission: RE | Admit: 2014-10-28 | Discharge: 2014-10-28 | Disposition: A | Discharge status: Home / Self Care | Payer: 59 | Source: Ambulatory Visit | Attending: Radiation Oncology | Admitting: Radiation Oncology

## 2014-10-28 ENCOUNTER — Encounter: Payer: Self-pay | Admitting: Radiation Oncology

## 2014-10-28 DIAGNOSIS — C7931 Secondary malignant neoplasm of brain: Secondary | ICD-10-CM | POA: Diagnosis not present

## 2014-10-28 NOTE — Telephone Encounter (Signed)
Pt askign for rx for temodar. I instructed pt to go to [facility] where it was sent.  I also printed schedule for her.

## 2014-10-29 ENCOUNTER — Ambulatory Visit: Payer: 59

## 2014-10-29 ENCOUNTER — Other Ambulatory Visit: Payer: 59

## 2014-10-30 ENCOUNTER — Other Ambulatory Visit: Payer: Self-pay | Admitting: Radiation Oncology

## 2014-10-30 ENCOUNTER — Ambulatory Visit (HOSPITAL_COMMUNITY)
Admission: RE | Admit: 2014-10-30 | Discharge: 2014-10-30 | Disposition: A | Discharge status: Home / Self Care | Payer: 59 | Source: Ambulatory Visit | Attending: Radiation Oncology | Admitting: Radiation Oncology

## 2014-10-30 ENCOUNTER — Other Ambulatory Visit (HOSPITAL_BASED_OUTPATIENT_CLINIC_OR_DEPARTMENT_OTHER): Payer: 59

## 2014-10-30 DIAGNOSIS — C7931 Secondary malignant neoplasm of brain: Secondary | ICD-10-CM | POA: Insufficient documentation

## 2014-10-30 DIAGNOSIS — D709 Neutropenia, unspecified: Secondary | ICD-10-CM

## 2014-10-30 DIAGNOSIS — C3412 Malignant neoplasm of upper lobe, left bronchus or lung: Secondary | ICD-10-CM

## 2014-10-30 DIAGNOSIS — Z87891 Personal history of nicotine dependence: Secondary | ICD-10-CM | POA: Diagnosis not present

## 2014-10-30 LAB — CBC WITH DIFFERENTIAL/PLATELET
BASO%: 0.5 % (ref 0.0–2.0)
Basophils Absolute: 0 10*3/uL (ref 0.0–0.1)
EOS ABS: 0.1 10*3/uL (ref 0.0–0.5)
EOS%: 2.3 % (ref 0.0–7.0)
HCT: 29.7 % — ABNORMAL LOW (ref 34.8–46.6)
HEMOGLOBIN: 9.8 g/dL — AB (ref 11.6–15.9)
LYMPH#: 0.8 10*3/uL — AB (ref 0.9–3.3)
LYMPH%: 33.9 % (ref 14.0–49.7)
MCH: 34.5 pg — ABNORMAL HIGH (ref 25.1–34.0)
MCHC: 33 g/dL (ref 31.5–36.0)
MCV: 104.6 fL — AB (ref 79.5–101.0)
MONO#: 0.4 10*3/uL (ref 0.1–0.9)
MONO%: 16.3 % — AB (ref 0.0–14.0)
NEUT#: 1 10*3/uL — ABNORMAL LOW (ref 1.5–6.5)
NEUT%: 47 % (ref 38.4–76.8)
Platelets: 73 10*3/uL — ABNORMAL LOW (ref 145–400)
RBC: 2.84 10*6/uL — AB (ref 3.70–5.45)
RDW: 17 % — AB (ref 11.2–14.5)
WBC: 2.2 10*3/uL — AB (ref 3.9–10.3)

## 2014-10-30 LAB — COMPREHENSIVE METABOLIC PANEL (CC13)
ALK PHOS: 51 U/L (ref 40–150)
ALT: 44 U/L (ref 0–55)
AST: 36 U/L — ABNORMAL HIGH (ref 5–34)
Albumin: 4.1 g/dL (ref 3.5–5.0)
Anion Gap: 10 mEq/L (ref 3–11)
BUN: 11.1 mg/dL (ref 7.0–26.0)
CO2: 26 meq/L (ref 22–29)
CREATININE: 0.7 mg/dL (ref 0.6–1.1)
Calcium: 9.9 mg/dL (ref 8.4–10.4)
Chloride: 106 mEq/L (ref 98–109)
EGFR: >90 mL/min/{1.73_m2} (ref >90)
GLUCOSE: 108 mg/dL (ref 70–140)
Potassium: 4.4 mEq/L (ref 3.5–5.1)
SODIUM: 142 meq/L (ref 136–145)
Total Bilirubin: 0.47 mg/dL (ref 0.20–1.20)
Total Protein: 6.8 g/dL (ref 6.4–8.3)

## 2014-10-30 LAB — MAGNESIUM (CC13): MAGNESIUM: 2.1 mg/dL (ref 1.5–2.5)

## 2014-10-30 MED ORDER — GADOBENATE DIMEGLUMINE 529 MG/ML IV SOLN
16.0000 mL | Freq: Once | INTRAVENOUS | Status: AC | PRN
  Administered 2014-10-30: 16 mL via INTRAVENOUS

## 2014-10-31 ENCOUNTER — Encounter: Payer: Self-pay | Admitting: Radiation Oncology

## 2014-10-31 NOTE — Progress Notes (Signed)
Authorization for yesterday's scan (3/3) is changed and approved for a [id number]. Auth# [id number]

## 2014-11-02 NOTE — Progress Notes (Signed)
  Radiation Oncology         [phone] ________________________________  Name: Carol Bond MRN: [id number]  Date: 10/28/2014  DOB: 22-Sep-1955  End of Treatment Note    ICD-9-CM ICD-10-CM   1. Brain metastases 198.3 C79.31     DIAGNOSIS: 59 year old woman with numerous recurrent brain metastases following previous whole brain radiation and salvage stereotactic radiosurgery from small cell lung cancer     Indication for treatment:  Palliation       Radiation treatment dates:   10/13/2014-3/1/20165  Site/dose:   The whole brain received 30 Gy in 12 fractions of 2.5 Gy  Beams/energy:   Right and Left 6 MV X-ray fields were used with a thermoplastic mask and custom MLCs to shape the radiation distribution.  Narrative: The patient tolerated radiation treatment relatively well.     Plan: The patient has completed radiation treatment. The patient will return to radiation oncology clinic for routine followup in one month. I advised her to call or return sooner if she has any questions or concerns related to her recovery or treatment. ________________________________  [name]. Tammi Klippel, M.D.

## 2014-11-04 ENCOUNTER — Telehealth: Payer: Self-pay | Admitting: Internal Medicine

## 2014-11-04 NOTE — Telephone Encounter (Signed)
returned call and s.w. pt and r/s appt per pt request....pt ok and aware of new d.t

## 2014-11-05 ENCOUNTER — Other Ambulatory Visit: Payer: 59

## 2014-11-06 ENCOUNTER — Other Ambulatory Visit (HOSPITAL_BASED_OUTPATIENT_CLINIC_OR_DEPARTMENT_OTHER): Payer: 59

## 2014-11-06 DIAGNOSIS — C3412 Malignant neoplasm of upper lobe, left bronchus or lung: Secondary | ICD-10-CM

## 2014-11-06 DIAGNOSIS — C3490 Malignant neoplasm of unspecified part of unspecified bronchus or lung: Secondary | ICD-10-CM

## 2014-11-06 LAB — COMPREHENSIVE METABOLIC PANEL (CC13)
ALT: 47 U/L (ref 0–55)
AST: 38 U/L — ABNORMAL HIGH (ref 5–34)
Albumin: 4.4 g/dL (ref 3.5–5.0)
Alkaline Phosphatase: 52 U/L (ref 40–150)
Anion Gap: 11 mEq/L (ref 3–11)
BUN: 14.2 mg/dL (ref 7.0–26.0)
CALCIUM: 10.1 mg/dL (ref 8.4–10.4)
CO2: 26 meq/L (ref 22–29)
Chloride: 104 mEq/L (ref 98–109)
Creatinine: 0.9 mg/dL (ref 0.6–1.1)
EGFR: 75 mL/min/{1.73_m2} — AB (ref >90)
GLUCOSE: 109 mg/dL (ref 70–140)
Potassium: 4.5 mEq/L (ref 3.5–5.1)
Sodium: 140 mEq/L (ref 136–145)
Total Bilirubin: 0.5 mg/dL (ref 0.20–1.20)
Total Protein: 7.4 g/dL (ref 6.4–8.3)

## 2014-11-06 LAB — CBC WITH DIFFERENTIAL/PLATELET
BASO%: 0.4 % (ref 0.0–2.0)
BASOS ABS: 0 10*3/uL (ref 0.0–0.1)
EOS ABS: 0 10*3/uL (ref 0.0–0.5)
EOS%: 1.8 % (ref 0.0–7.0)
HEMATOCRIT: 33.7 % — AB (ref 34.8–46.6)
HGB: 11 g/dL — ABNORMAL LOW (ref 11.6–15.9)
LYMPH%: 23.9 % (ref 14.0–49.7)
MCH: 33.8 pg (ref 25.1–34.0)
MCHC: 32.6 g/dL (ref 31.5–36.0)
MCV: 103.7 fL — ABNORMAL HIGH (ref 79.5–101.0)
MONO#: 0.4 10*3/uL (ref 0.1–0.9)
MONO%: 17.7 % — AB (ref 0.0–14.0)
NEUT%: 56.2 % (ref 38.4–76.8)
NEUTROS ABS: 1.3 10*3/uL — AB (ref 1.5–6.5)
Platelets: 108 10*3/uL — ABNORMAL LOW (ref 145–400)
RBC: 3.25 10*6/uL — ABNORMAL LOW (ref 3.70–5.45)
RDW: 15.3 % — ABNORMAL HIGH (ref 11.2–14.5)
WBC: 2.3 10*3/uL — ABNORMAL LOW (ref 3.9–10.3)
lymph#: 0.5 10*3/uL — ABNORMAL LOW (ref 0.9–3.3)

## 2014-11-06 LAB — MAGNESIUM (CC13): Magnesium: 2.2 mg/dl (ref 1.5–2.5)

## 2014-11-10 ENCOUNTER — Encounter: Payer: Self-pay | Admitting: Internal Medicine

## 2014-11-10 NOTE — Progress Notes (Signed)
I faxed fmla forms to  [phone] and  [phone] for Carol Bond the patient's daughter

## 2014-11-10 NOTE — Progress Notes (Signed)
I will place fmla forms for Carol Bond on the desk for dr. Julien Nordmann.

## 2014-11-12 ENCOUNTER — Encounter: Payer: Self-pay | Admitting: Internal Medicine

## 2014-11-12 ENCOUNTER — Ambulatory Visit (HOSPITAL_BASED_OUTPATIENT_CLINIC_OR_DEPARTMENT_OTHER): Payer: 59 | Admitting: Internal Medicine

## 2014-11-12 ENCOUNTER — Other Ambulatory Visit (HOSPITAL_BASED_OUTPATIENT_CLINIC_OR_DEPARTMENT_OTHER): Payer: 59

## 2014-11-12 ENCOUNTER — Telehealth: Payer: Self-pay | Admitting: Internal Medicine

## 2014-11-12 VITALS — BP 121/74 | HR 98 | Temp 98.0°F | Resp 18 | Ht 66.0 in | Wt 169.5 lb

## 2014-11-12 DIAGNOSIS — C7931 Secondary malignant neoplasm of brain: Secondary | ICD-10-CM

## 2014-11-12 DIAGNOSIS — C3412 Malignant neoplasm of upper lobe, left bronchus or lung: Secondary | ICD-10-CM

## 2014-11-12 DIAGNOSIS — R0602 Shortness of breath: Secondary | ICD-10-CM

## 2014-11-12 DIAGNOSIS — C3490 Malignant neoplasm of unspecified part of unspecified bronchus or lung: Secondary | ICD-10-CM

## 2014-11-12 LAB — COMPREHENSIVE METABOLIC PANEL (CC13)
ALBUMIN: 4.4 g/dL (ref 3.5–5.0)
ALT: 51 U/L (ref 0–55)
ANION GAP: 11 meq/L (ref 3–11)
AST: 34 U/L (ref 5–34)
Alkaline Phosphatase: 51 U/L (ref 40–150)
BUN: 16.1 mg/dL (ref 7.0–26.0)
CO2: 25 meq/L (ref 22–29)
Calcium: 10.2 mg/dL (ref 8.4–10.4)
Chloride: 105 mEq/L (ref 98–109)
Creatinine: 0.9 mg/dL (ref 0.6–1.1)
EGFR: 72 mL/min/{1.73_m2} — ABNORMAL LOW (ref >90)
GLUCOSE: 137 mg/dL (ref 70–140)
POTASSIUM: 4.3 meq/L (ref 3.5–5.1)
SODIUM: 141 meq/L (ref 136–145)
TOTAL PROTEIN: 7.7 g/dL (ref 6.4–8.3)
Total Bilirubin: 0.4 mg/dL (ref 0.20–1.20)

## 2014-11-12 LAB — CBC WITH DIFFERENTIAL/PLATELET
BASO%: 0.4 % (ref 0.0–2.0)
Basophils Absolute: 0 10*3/uL (ref 0.0–0.1)
EOS%: 0.4 % (ref 0.0–7.0)
Eosinophils Absolute: 0 10*3/uL (ref 0.0–0.5)
HEMATOCRIT: 35.2 % (ref 34.8–46.6)
HGB: 11.7 g/dL (ref 11.6–15.9)
LYMPH#: 0.7 10*3/uL — AB (ref 0.9–3.3)
LYMPH%: 26.2 % (ref 14.0–49.7)
MCH: 34.2 pg — AB (ref 25.1–34.0)
MCHC: 33.2 g/dL (ref 31.5–36.0)
MCV: 102.9 fL — ABNORMAL HIGH (ref 79.5–101.0)
MONO#: 0.5 10*3/uL (ref 0.1–0.9)
MONO%: 19.1 % — ABNORMAL HIGH (ref 0.0–14.0)
NEUT#: 1.4 10*3/uL — ABNORMAL LOW (ref 1.5–6.5)
NEUT%: 53.9 % (ref 38.4–76.8)
Platelets: 130 10*3/uL — ABNORMAL LOW (ref 145–400)
RBC: 3.42 10*6/uL — ABNORMAL LOW (ref 3.70–5.45)
RDW: 14.7 % — ABNORMAL HIGH (ref 11.2–14.5)
WBC: 2.7 10*3/uL — ABNORMAL LOW (ref 3.9–10.3)

## 2014-11-12 NOTE — Progress Notes (Signed)
[facility] [phone]   Fax:(336) [place], [place], [address] [place] [address]  DIAGNOSIS: Lung cancer  Primary site: Lung (Left)  Staging method: AJCC 7th Edition  Clinical free text: Extensive stage small cell lung cancer  Clinical: Stage IV (T3, N2, M1b) signed by Curt Bears, MD on 12/17/2013 7:45 PM  Summary: Stage IV (T3, N2, M1b)   PRIOR THERAPY: 1) Whole brain irradiation under the care of Dr. Sondra Come. 2) Systemic chemotherapy with carboplatin for an AUC of 5 given on day 1, etoposide at 120 mg per meter square given on days 1, 2 and 3 with Neulasta support given on day 4. Status post 6 cycles. 3) Systemic chemotherapy with cisplatin at 30 mg/m and irinotecan 65 mg meter squared given on days 1 and 8 every 3 weeks. Status post 4 cycles.  CURRENT THERAPY: Temodar 200 MG/M2 (360 mg/day) for 5 days every 4 weeks, status post 1 cycle. First dose started 11/03/2014.  DISEASE STAGE: Lung cancer  Primary site: Lung (Left)  Staging method: AJCC 7th Edition  Clinical free text: Extensive stage small cell lung cancer  Clinical: Stage IV (T3, N2, M1b) signed by Curt Bears, MD on 12/17/2013 7:45 PM  Summary: Stage IV (T3, N2, M1b)  CHEMOTHERAPY INTENT: Palliative  CURRENT # OF CHEMOTHERAPY CYCLES: 1 CURRENT ANTIEMETICS: none  CURRENT SMOKING STATUS: Former smoker, quit July 2014  ORAL CHEMOTHERAPY AND CONSENT: n/a  CURRENT BISPHOSPHONATES USE: none  PAIN MANAGEMENT: oxycodone  NARCOTICS INDUCED CONSTIPATION: none  LIVING WILL AND CODE STATUS:    INTERVAL HISTORY: Carol Bond 59 y.o. female returns to the clinic today for followup visit accompanied by her husband and daughter. She is currently on treatment with oral Temodar and tolerated the first cycle of her treatment fairly well except for mild nausea. Her nausea improved with Zofran. The patient continues to have  problem with balance and coordination secondary to her recent recurrent brain metastasis.  She denied having any current significant nausea or vomiting, no fever or chills. She has no significant chest pain but continues to have shortness breath with exertion was no cough or hemoptysis. The patient denied having any significant weight loss or night sweats.  She is here today for evaluation and repeat blood work.  MEDICAL HISTORY: Past Medical History  Diagnosis Date  . Pulmonary embolism   . Hypothyroid   . Radiation 12/18/13-12/31/13    whole brain 30 gray, left central chest 30 gray  . 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 Brain cancer   . Diabetes     metformin    ALLERGIES:  is allergic to actos and vicodin.  MEDICATIONS:  Current Outpatient Prescriptions  Medication Sig Dispense Refill  . albuterol (PROVENTIL HFA;VENTOLIN HFA) 108 (90 BASE) MCG/ACT inhaler Inhale 2 puffs into the lungs every 6 (six) hours as needed for wheezing or shortness of breath. 1 Inhaler 2  . Canagliflozin (INVOKANA) 300 MG TABS Take 1 tablet by mouth daily before breakfast.    . escitalopram (LEXAPRO) 10 MG tablet Take 10 mg by mouth at bedtime.     Marland Kitchen esomeprazole (NEXIUM) 20 MG capsule Take 20 mg by mouth daily as needed (for acid reflex).    . fenofibrate 160 MG tablet Take 160 mg by mouth at bedtime.     Marland Kitchen glimepiride (AMARYL) 4 MG tablet Take  4 mg by mouth daily with breakfast.    . levothyroxine (SYNTHROID, LEVOTHROID) 150 MCG tablet Take 150 mcg by mouth daily before breakfast.    . LORazepam (ATIVAN) 1 MG tablet Take 0.5-1 mg by mouth 2 (two) times daily.     Marland Kitchen losartan (COZAAR) 50 MG tablet Take 50 mg by mouth at bedtime.     . ondansetron (ZOFRAN) 8 MG tablet Take 8 mg by mouth every 8 (eight) hours as needed for nausea or vomiting.     Marland Kitchen oxyCODONE-acetaminophen (PERCOCET) 5-325 MG per tablet Take 1-2 tablets by mouth every 4 (four) hours as needed. 40 tablet 0  .  sitaGLIPtin-metformin (JANUMET) 50-1000 MG per tablet Take 1 tablet by mouth 2 (two) times daily with a meal.    . sulfamethoxazole-trimethoprim (BACTRIM DS,SEPTRA DS) 800-160 MG per tablet One tablets by mouth twice a day on Mondays and Thursdays weekly. 30 tablet 1  . temozolomide (TEMODAR) 180 MG capsule Take 2 capsules (360 mg total) by mouth daily. May take on an empty stomach or at bedtime to decrease nausea & vomiting. 10 capsule 2  . vitamin B-12 (CYANOCOBALAMIN) 100 MCG tablet Take 100 mcg by mouth daily.     No current facility-administered medications for this visit.   Facility-Administered Medications Ordered in Other Visits  Medication Dose Route Frequency Provider Last Rate Last Dose  . sodium chloride 0.9 % injection 10 mL  10 mL Intracatheter PRN Curt Bears, MD        SURGICAL HISTORY:  Past Surgical History  Procedure Laterality Date  . Abdominal hysterectomy    . Spine surgery    . Cervical fusion    . Video bronchoscopy with endobronchial ultrasound N/A 12/13/2013    Procedure: VIDEO BRONCHOSCOPY WITH ENDOBRONCHIAL ULTRASOUND;  Surgeon: Grace Isaac, MD;  Location: [facility];  Service: Thoracic;  Laterality: N/A;  . Portacath placement Left 12/13/2013    Procedure: INSERTION PORT-A-CATH;  Surgeon: Grace Isaac, MD;  Location: [facility];  Service: Thoracic;  Laterality: Left;    REVIEW OF SYSTEMS:  A comprehensive review of systems was negative except for: Constitutional: positive for fatigue Respiratory: positive for dyspnea on exertion Neurological: positive for coordination problems   PHYSICAL EXAMINATION: General appearance: alert, cooperative, fatigued and no distress Head: Normocephalic, without obvious abnormality, atraumatic Neck: no adenopathy, no JVD, supple, symmetrical, trachea midline and thyroid not enlarged, symmetric, no tenderness/mass/nodules Lymph nodes: Cervical, supraclavicular, and axillary nodes normal. Resp: clear to auscultation  bilaterally Back: symmetric, no curvature. ROM normal. No CVA tenderness. Cardio: regular rate and rhythm, S1, S2 normal, no murmur, click, rub or gallop GI: soft, non-tender; bowel sounds normal; no masses,  no organomegaly Extremities: extremities normal, atraumatic, no cyanosis or edema Neurologic: Alert and oriented X 3, normal strength and tone. Normal symmetric reflexes. Normal coordination and gait  ECOG PERFORMANCE STATUS: 1 - Symptomatic but completely ambulatory  Blood pressure 121/74, pulse 98, temperature 98 F (36.7 C), temperature source Oral, resp. rate 18, height 5\' 6"  (1.676 m), weight 169 lb 8 oz (76.885 kg).  LABORATORY DATA: Lab Results  Component Value Date   WBC 2.7* 11/12/2014   HGB 11.7 11/12/2014   HCT 35.2 11/12/2014   MCV 102.9* 11/12/2014   PLT 130* 11/12/2014      Chemistry      Component Value Date/Time   NA 140 11/06/2014 1044   NA 137 07/11/2014 0840   K 4.5 11/06/2014 1044   K 4.1 07/11/2014 0840   CL  97 07/11/2014 0840   CO2 26 11/06/2014 1044   CO2 24 07/11/2014 0840   BUN 14.2 11/06/2014 1044   BUN 15 07/11/2014 0840   CREATININE 0.9 11/06/2014 1044   CREATININE 0.60 07/11/2014 0840      Component Value Date/Time   CALCIUM 10.1 11/06/2014 1044   CALCIUM 10.3 07/11/2014 0840   ALKPHOS 52 11/06/2014 1044   ALKPHOS 54 12/19/2013 0320   AST 38* 11/06/2014 1044   AST 24 12/19/2013 0320   ALT 47 11/06/2014 1044   ALT 48* 12/19/2013 0320   BILITOT 0.50 11/06/2014 1044   BILITOT <0.2* 12/19/2013 0320       RADIOGRAPHIC STUDIES: Ct Chest W Contrast  10/20/2014   CLINICAL DATA:  Sml cell lung ca dx;d 4/15 with brain mets, ongoing chemo & brain XRT, prev lung XRT,,,pt is off balance  EXAM: CT CHEST, ABDOMEN, AND PELVIS WITH CONTRAST  TECHNIQUE: Multidetector CT imaging of the chest, abdomen and pelvis was performed following the standard protocol during bolus administration of intravenous contrast.  CONTRAST:  164mL OMNIPAQUE IOHEXOL 300  MG/ML  SOLN  COMPARISON:  08/26/2014  FINDINGS: CT CHEST FINDINGS  There has been worsening the chest since the prior study.  The irregular mass above the left hilum, which borders on the left pulmonary artery and bulges into the AP window, is larger. It currently measures 5.8 cm x 3.5 cm in greatest transverse dimension along the same axes it measured 5.1 cm x 2.9 cm previously.  An enlarged left superior mediastinal lymph node now measures 17 mm x 17 mm, previously 14 mm x 14 mm. Pleural disease along the left and posterior heart border has worsened. Largest deposit now measures 14 mm in thickness, previously 13 mm. Another deposits posterior to the left ventricle measures 8.6 mm in thickness, previously 6.6 mm. An inferior posterior mediastinal node currently measures 10 mm in short axis, previously 7.6 mm.  There is a new small left pleural effusion.  There are multiple small lung nodules in the left lower lobe which have increased in size and number since the prior study. Largest of these measures between 5 and 6 mm. There is a new small nodule along the peripheral margin of the anteromedial left upper lobe, lingula.  No right lung nodules. No right pleural effusion. Coarse reticular and patchy opacity in the left upper lobe adjacent to the left suprahilar mass is stable, consistent with post treatment related change.  There is a small amount of pericardial fluid, increased from the prior exam.  Heart is normal in size and configuration. Great vessels are normal in caliber. No right hilar masses or adenopathy.  CT ABDOMEN AND PELVIS FINDINGS  13 mm enhancing lesion in the far lateral margin of the lateral segment of the left liver lobe, at the dome, stable. This is felt to be a benign vascular lesion. Liver is borderline enlarged. There is diffuse fatty infiltration. No other liver lesion.  Spleen, gallbladder and pancreas:  Unremarkable.  Left adrenal mass now measures 3.2 cm x 2.7 cm, previously 2.5 cm x 2  cm. Right adrenal mass also increased in size, measuring 18 mm x 11 mm.  Kidneys, ureters and bladder are unremarkable.  The uterus surgically absent.  No pelvic masses.  No adenopathy.  No ascites.  Stable left colon diverticula. No diverticulitis. Colon otherwise unremarkable. Normal small bowel.  MUSCULOSKELETAL  No osteoblastic or osteolytic lesions.  IMPRESSION: 1. Worsening metastatic disease from lung carcinoma. 2. In the chest, the  primary mass above the left hilum has enlarged. Pleural disease, including that along the left Heart pleural margin, has worsened. There are new and enlarged small pulmonary nodules. Mediastinal adenopathy has increased. New left pleural effusion. 3. In the abdomen pelvis, bilateral adrenal masses have increased in size. No other evidence of metastatic disease within the abdomen or pelvis. 4. No evidence of bony metastatic disease.   Electronically Signed   By: Lajean Manes M.D.   On: 10/20/2014 15:32   Mr Cervical Spine W Wo Contrast  10/30/2014   CLINICAL DATA:  Non-small cell lung cancer. Brain metastases. Recent onset of left arm pain and numbness for 6 weeks.  EXAM: MRI CERVICAL SPINE WITHOUT AND WITH CONTRAST  TECHNIQUE: Multiplanar and multiecho pulse sequences of the cervical spine, to include the craniocervical junction and cervicothoracic junction, were obtained according to standard protocol without and with intravenous contrast.  CONTRAST:  33mL MULTIHANCE GADOBENATE DIMEGLUMINE 529 MG/ML IV SOLN  COMPARISON:  Cervical spine myelogram of 06/20/2005  FINDINGS: Anterior fusion at C6-7 is stable. There straightening of the normal cervical lordosis. Marrow signal, vertebral body heights, and alignment are normal.  Postcontrast images demonstrate no pathologic enhancement of the marrow spaces.  Normal signal is present in the cervical and upper thoracic spinal cord to the lowest imaged level. The craniocervical junction is within normal limits. The visualized soft  tissues of the neck are unremarkable.  C1-2: Asymmetric degenerative changes are noted along the posterior elements on the left. This results in mild to moderate left foraminal stenosis. The central canal and right foramen are patent.  C2-3: Mild facet hypertrophy is worse on the right. There is no significant stenosis.  C3-4: A mild broad-based disc osteophyte complex is present. There is partial effacement of the ventral CSF. Mild left foraminal stenosis is evident.  C4-5: A broad-based disc osteophyte complex effaces the ventral CSF. Uncovertebral spurring results in mild foraminal stenosis, worse on the right.  C5-6: A broad-based disc osteophyte complex is present. Uncovertebral spurring is evident bilaterally. Moderate central and mild bilateral foraminal stenosis is present, worse on the left.  C6-7: The patient is fused anteriorly. There is no residual or recurrent stenosis.  C7-T1: Negative.  IMPRESSION: 1. Punctate enhancing metastases within the brainstem are again noted. The left cerebellar metastases art lateral to the field of view. 2. No evidence for metastatic disease in the spinal cord or marrow containing spaces of the cervical spine. 3. Asymmetric left foraminal stenosis at C1-2 due to degenerative change. 4. Mild central and left foraminal stenosis at C3-4. 5. Mild to moderate central and mild left foraminal stenosis at C4-5. 6. Moderate central and mild bilateral foraminal stenosis at C5-6, worse on the left. 7. Anterior fusion at C6-7 without significant residual or recurrent stenosis.   Electronically Signed   By: San Morelle M.D.   On: 10/30/2014 13:44   Ct Abdomen Pelvis W Contrast  10/20/2014   CLINICAL DATA:  Sml cell lung ca dx;d 4/15 with brain mets, ongoing chemo & brain XRT, prev lung XRT,,,pt is off balance  EXAM: CT CHEST, ABDOMEN, AND PELVIS WITH CONTRAST  TECHNIQUE: Multidetector CT imaging of the chest, abdomen and pelvis was performed following the standard protocol  during bolus administration of intravenous contrast.  CONTRAST:  180mL OMNIPAQUE IOHEXOL 300 MG/ML  SOLN  COMPARISON:  08/26/2014  FINDINGS: CT CHEST FINDINGS  There has been worsening the chest since the prior study.  The irregular mass above the left hilum, which borders on  the left pulmonary artery and bulges into the AP window, is larger. It currently measures 5.8 cm x 3.5 cm in greatest transverse dimension along the same axes it measured 5.1 cm x 2.9 cm previously.  An enlarged left superior mediastinal lymph node now measures 17 mm x 17 mm, previously 14 mm x 14 mm. Pleural disease along the left and posterior heart border has worsened. Largest deposit now measures 14 mm in thickness, previously 13 mm. Another deposits posterior to the left ventricle measures 8.6 mm in thickness, previously 6.6 mm. An inferior posterior mediastinal node currently measures 10 mm in short axis, previously 7.6 mm.  There is a new small left pleural effusion.  There are multiple small lung nodules in the left lower lobe which have increased in size and number since the prior study. Largest of these measures between 5 and 6 mm. There is a new small nodule along the peripheral margin of the anteromedial left upper lobe, lingula.  No right lung nodules. No right pleural effusion. Coarse reticular and patchy opacity in the left upper lobe adjacent to the left suprahilar mass is stable, consistent with post treatment related change.  There is a small amount of pericardial fluid, increased from the prior exam.  Heart is normal in size and configuration. Great vessels are normal in caliber. No right hilar masses or adenopathy.  CT ABDOMEN AND PELVIS FINDINGS  13 mm enhancing lesion in the far lateral margin of the lateral segment of the left liver lobe, at the dome, stable. This is felt to be a benign vascular lesion. Liver is borderline enlarged. There is diffuse fatty infiltration. No other liver lesion.  Spleen, gallbladder and  pancreas:  Unremarkable.  Left adrenal mass now measures 3.2 cm x 2.7 cm, previously 2.5 cm x 2 cm. Right adrenal mass also increased in size, measuring 18 mm x 11 mm.  Kidneys, ureters and bladder are unremarkable.  The uterus surgically absent.  No pelvic masses.  No adenopathy.  No ascites.  Stable left colon diverticula. No diverticulitis. Colon otherwise unremarkable. Normal small bowel.  MUSCULOSKELETAL  No osteoblastic or osteolytic lesions.  IMPRESSION: 1. Worsening metastatic disease from lung carcinoma. 2. In the chest, the primary mass above the left hilum has enlarged. Pleural disease, including that along the left Heart pleural margin, has worsened. There are new and enlarged small pulmonary nodules. Mediastinal adenopathy has increased. New left pleural effusion. 3. In the abdomen pelvis, bilateral adrenal masses have increased in size. No other evidence of metastatic disease within the abdomen or pelvis. 4. No evidence of bony metastatic disease.   Electronically Signed   By: Lajean Manes M.D.   On: 10/20/2014 15:32    ASSESSMENT AND PLAN: This is a very pleasant 59 years old white female recently diagnosed with extensive stage small cell lung cancer status post whole brain irradiation in addition to 6 cycles of systemic chemotherapy with carboplatin and etoposide with significant improvement in her disease but unfortunately a few months later she started having evidence for disease progression. The patient completed second line chemotherapy with cisplatin and irinotecan status post 4 cycles. This was discontinued secondary to disease progression. The patient is currently on second line chemotherapy with oral Temodar status post 1 cycle. She is expected to start cycle #2 on 12/01/2014. She will come back for follow-up visit at that time.  She was advised to call immediately if she has any concerning symptoms in the interval.  She was advised to call  immediately if she has any concerning  symptoms in the interval. The patient voices understanding of current disease status and treatment options and is in agreement with the current care plan.  All questions were answered. The patient knows to call the clinic with any problems, questions or concerns. We can certainly see the patient much sooner if necessary.  Disclaimer: This note was dictated with voice recognition software. Similar sounding words can inadvertently be transcribed and may not be corrected upon review.

## 2014-11-12 NOTE — Telephone Encounter (Signed)
Gave and printed appt sched and avs for pt for April

## 2014-11-19 ENCOUNTER — Other Ambulatory Visit: Payer: 59

## 2014-11-19 ENCOUNTER — Telehealth: Payer: Self-pay | Admitting: Internal Medicine

## 2014-11-19 NOTE — Telephone Encounter (Signed)
patient called to confirm that she does not want to r/s cx lab.Marland KitchenMarland Kitchen

## 2014-11-19 NOTE — Telephone Encounter (Signed)
returned call and left voicemail for pt to call back to r/s cx lab.

## 2014-11-28 ENCOUNTER — Other Ambulatory Visit: Payer: Self-pay | Admitting: Radiation Therapy

## 2014-11-28 DIAGNOSIS — C7931 Secondary malignant neoplasm of brain: Secondary | ICD-10-CM

## 2014-12-01 ENCOUNTER — Other Ambulatory Visit (HOSPITAL_BASED_OUTPATIENT_CLINIC_OR_DEPARTMENT_OTHER): Payer: 59

## 2014-12-01 ENCOUNTER — Ambulatory Visit (HOSPITAL_BASED_OUTPATIENT_CLINIC_OR_DEPARTMENT_OTHER): Payer: 59

## 2014-12-01 ENCOUNTER — Ambulatory Visit (HOSPITAL_BASED_OUTPATIENT_CLINIC_OR_DEPARTMENT_OTHER): Payer: 59 | Admitting: Internal Medicine

## 2014-12-01 ENCOUNTER — Telehealth: Payer: Self-pay | Admitting: Internal Medicine

## 2014-12-01 ENCOUNTER — Encounter: Payer: Self-pay | Admitting: Internal Medicine

## 2014-12-01 VITALS — BP 103/60 | HR 86 | Temp 98.1°F | Resp 18 | Ht 66.0 in | Wt 170.1 lb

## 2014-12-01 DIAGNOSIS — C3412 Malignant neoplasm of upper lobe, left bronchus or lung: Secondary | ICD-10-CM | POA: Diagnosis not present

## 2014-12-01 DIAGNOSIS — C3492 Malignant neoplasm of unspecified part of left bronchus or lung: Secondary | ICD-10-CM

## 2014-12-01 DIAGNOSIS — D709 Neutropenia, unspecified: Secondary | ICD-10-CM

## 2014-12-01 DIAGNOSIS — Z87891 Personal history of nicotine dependence: Secondary | ICD-10-CM | POA: Diagnosis not present

## 2014-12-01 DIAGNOSIS — T451X5A Adverse effect of antineoplastic and immunosuppressive drugs, initial encounter: Principal | ICD-10-CM

## 2014-12-01 DIAGNOSIS — D701 Agranulocytosis secondary to cancer chemotherapy: Secondary | ICD-10-CM | POA: Insufficient documentation

## 2014-12-01 DIAGNOSIS — C3490 Malignant neoplasm of unspecified part of unspecified bronchus or lung: Secondary | ICD-10-CM

## 2014-12-01 DIAGNOSIS — C7931 Secondary malignant neoplasm of brain: Secondary | ICD-10-CM

## 2014-12-01 LAB — COMPREHENSIVE METABOLIC PANEL (CC13)
ALK PHOS: 46 U/L (ref 40–150)
ALT: 34 U/L (ref 0–55)
AST: 25 U/L (ref 5–34)
Albumin: 4.2 g/dL (ref 3.5–5.0)
Anion Gap: 11 mEq/L (ref 3–11)
BILIRUBIN TOTAL: 0.26 mg/dL (ref 0.20–1.20)
BUN: 18 mg/dL (ref 7.0–26.0)
CHLORIDE: 105 meq/L (ref 98–109)
CO2: 26 mEq/L (ref 22–29)
CREATININE: 0.8 mg/dL (ref 0.6–1.1)
Calcium: 9.5 mg/dL (ref 8.4–10.4)
EGFR: 87 mL/min/{1.73_m2} — ABNORMAL LOW (ref >90)
Glucose: 139 mg/dl (ref 70–140)
Potassium: 3.8 mEq/L (ref 3.5–5.1)
Sodium: 142 mEq/L (ref 136–145)
Total Protein: 7 g/dL (ref 6.4–8.3)

## 2014-12-01 LAB — CBC WITH DIFFERENTIAL/PLATELET
BASO%: 0.7 % (ref 0.0–2.0)
BASOS ABS: 0 10*3/uL (ref 0.0–0.1)
EOS ABS: 0 10*3/uL (ref 0.0–0.5)
EOS%: 1.7 % (ref 0.0–7.0)
HCT: 33.9 % — ABNORMAL LOW (ref 34.8–46.6)
HGB: 11 g/dL — ABNORMAL LOW (ref 11.6–15.9)
LYMPH%: 35 % (ref 14.0–49.7)
MCH: 32.5 pg (ref 25.1–34.0)
MCHC: 32.4 g/dL (ref 31.5–36.0)
MCV: 100.4 fL (ref 79.5–101.0)
MONO#: 0.3 10*3/uL (ref 0.1–0.9)
MONO%: 17.2 % — AB (ref 0.0–14.0)
NEUT%: 45.4 % (ref 38.4–76.8)
NEUTROS ABS: 0.8 10*3/uL — AB (ref 1.5–6.5)
Platelets: 152 10*3/uL (ref 145–400)
RBC: 3.38 10*6/uL — AB (ref 3.70–5.45)
RDW: 14.4 % (ref 11.2–14.5)
WBC: 1.7 10*3/uL — AB (ref 3.9–10.3)
lymph#: 0.6 10*3/uL — ABNORMAL LOW (ref 0.9–3.3)

## 2014-12-01 LAB — MAGNESIUM (CC13): Magnesium: 2.3 mg/dl (ref 1.5–2.5)

## 2014-12-01 MED ORDER — TBO-FILGRASTIM 480 MCG/0.8ML ~~LOC~~ SOSY
480.0000 ug | PREFILLED_SYRINGE | Freq: Once | SUBCUTANEOUS | Status: AC
  Administered 2014-12-01: 480 ug via SUBCUTANEOUS
  Filled 2014-12-01: qty 0.8

## 2014-12-01 MED ORDER — FILGRASTIM 480 MCG/0.8ML IJ SOSY
480.0000 ug | PREFILLED_SYRINGE | Freq: Once | INTRAMUSCULAR | Status: DC
  Filled 2014-12-01: qty 0.8

## 2014-12-01 NOTE — Progress Notes (Signed)
[place] [phone]   Fax:([phone]) [place], [place] [address]  DIAGNOSIS: Lung cancer  Primary site: Lung (Left)  Staging method: AJCC 7th Edition  Clinical free text: Extensive stage small cell lung cancer  Clinical: Stage IV (T3, N2, M1b) signed by Curt Bears, MD on 12/17/2013 7:45 PM  Summary: Stage IV (T3, N2, M1b)   PRIOR THERAPY: 1) Whole brain irradiation under the care of Dr. Sondra Come. 2) Systemic chemotherapy with carboplatin for an AUC of 5 given on day 1, etoposide at 120 mg per meter square given on days 1, 2 and 3 with Neulasta support given on day 4. Status post 6 cycles. 3) Systemic chemotherapy with cisplatin at 30 mg/m and irinotecan 65 mg meter squared given on days 1 and 8 every 3 weeks. Status post 4 cycles.  CURRENT THERAPY: Temodar 200 MG/M2 (360 mg/day) for 5 days every 4 weeks, status post 1 cycle. First dose started 11/03/2014.  DISEASE STAGE: Lung cancer  Primary site: Lung (Left)  Staging method: AJCC 7th Edition  Clinical free text: Extensive stage small cell lung cancer  Clinical: Stage IV (T3, N2, M1b) signed by Curt Bears, MD on 12/17/2013 7:45 PM  Summary: Stage IV (T3, N2, M1b)  CHEMOTHERAPY INTENT: Palliative  CURRENT # OF CHEMOTHERAPY CYCLES: 2 CURRENT ANTIEMETICS: none  CURRENT SMOKING STATUS: Former smoker, quit July 2014  ORAL CHEMOTHERAPY AND CONSENT: n/a  CURRENT BISPHOSPHONATES USE: none  PAIN MANAGEMENT: oxycodone  NARCOTICS INDUCED CONSTIPATION: none  LIVING WILL AND CODE STATUS:    INTERVAL HISTORY: Carol Bond 59 y.o. female returns to the clinic today for followup visit accompanied by her husband and daughter. She is currently on treatment with oral Temodar and tolerated the first cycle of her treatment fairly well except for mild nausea, controlled with Zofran. The patient continues to have problem with  balance and and lack of coordination secondary to her recent recurrent brain metastasis.  She denied having any current significant nausea or vomiting, no fever or chills. She has no significant chest pain but continues to have shortness breath with exertion was no cough or hemoptysis. The patient denied having any significant weight loss or night sweats.  She is here today for evaluation and repeat blood work before starting cycle #2.  MEDICAL HISTORY: Past Medical History  Diagnosis Date  . Pulmonary embolism   . Hypothyroid   . Radiation 12/18/13-12/31/13    whole brain 30 gray, left central chest 30 gray  . 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 Brain cancer   . Diabetes     metformin    ALLERGIES:  is allergic to actos and vicodin.  MEDICATIONS:  Current Outpatient Prescriptions  Medication Sig Dispense Refill  . albuterol (PROVENTIL HFA;VENTOLIN HFA) 108 (90 BASE) MCG/ACT inhaler Inhale 2 puffs into the lungs every 6 (six) hours as needed for wheezing or shortness of breath. 1 Inhaler 2  . Canagliflozin (INVOKANA) 300 MG TABS Take 1 tablet by mouth daily before breakfast.    . escitalopram (LEXAPRO) 10 MG tablet Take 10 mg by mouth at bedtime.     Marland Kitchen esomeprazole (NEXIUM) 20 MG capsule Take 20 mg by mouth daily as needed (for acid reflex).    . fenofibrate 160 MG tablet Take 160 mg by mouth at bedtime.     Marland Kitchen glimepiride (  AMARYL) 4 MG tablet Take 4 mg by mouth daily with breakfast.    . Glucose Blood (FREESTYLE LITE TEST VI)     . levothyroxine (SYNTHROID, LEVOTHROID) 150 MCG tablet Take 150 mcg by mouth daily before breakfast.    . LORazepam (ATIVAN) 1 MG tablet Take 0.5-1 mg by mouth 2 (two) times daily.     Marland Kitchen losartan (COZAAR) 50 MG tablet Take 50 mg by mouth at bedtime.     . sitaGLIPtin-metformin (JANUMET) 50-1000 MG per tablet Take 1 tablet by mouth 2 (two) times daily with a meal.    . sulfamethoxazole-trimethoprim (BACTRIM DS,SEPTRA DS)  800-160 MG per tablet One tablets by mouth twice a day on Mondays and Thursdays weekly. 30 tablet 1  . temozolomide (TEMODAR) 180 MG capsule Take 2 capsules (360 mg total) by mouth daily. May take on an empty stomach or at bedtime to decrease nausea & vomiting. 10 capsule 2  . vitamin B-12 (CYANOCOBALAMIN) 100 MCG tablet Take 100 mcg by mouth daily.    . ondansetron (ZOFRAN) 8 MG tablet Take 8 mg by mouth every 8 (eight) hours as needed for nausea or vomiting.     Marland Kitchen oxyCODONE-acetaminophen (PERCOCET) 5-325 MG per tablet Take 1-2 tablets by mouth every 4 (four) hours as needed. (Patient not taking: Reported on 12/01/2014) 40 tablet 0   No current facility-administered medications for this visit.   Facility-Administered Medications Ordered in Other Visits  Medication Dose Route Frequency Provider Last Rate Last Dose  . sodium chloride 0.9 % injection 10 mL  10 mL Intracatheter PRN Curt Bears, MD        SURGICAL HISTORY:  Past Surgical History  Procedure Laterality Date  . Abdominal hysterectomy    . Spine surgery    . Cervical fusion    . Video bronchoscopy with endobronchial ultrasound N/A 12/13/2013    Procedure: VIDEO BRONCHOSCOPY WITH ENDOBRONCHIAL ULTRASOUND;  Surgeon: Grace Isaac, MD;  Location: [facility];  Service: Thoracic;  Laterality: N/A;  . Portacath placement Left 12/13/2013    Procedure: INSERTION PORT-A-CATH;  Surgeon: Grace Isaac, MD;  Location: [facility];  Service: Thoracic;  Laterality: Left;    REVIEW OF SYSTEMS:  A comprehensive review of systems was negative except for: Constitutional: positive for fatigue Respiratory: positive for dyspnea on exertion Neurological: positive for coordination problems   PHYSICAL EXAMINATION: General appearance: alert, cooperative, fatigued and no distress Head: Normocephalic, without obvious abnormality, atraumatic Neck: no adenopathy, no JVD, supple, symmetrical, trachea midline and thyroid not enlarged, symmetric, no  tenderness/mass/nodules Lymph nodes: Cervical, supraclavicular, and axillary nodes normal. Resp: clear to auscultation bilaterally Back: symmetric, no curvature. ROM normal. No CVA tenderness. Cardio: regular rate and rhythm, S1, S2 normal, no murmur, click, rub or gallop GI: soft, non-tender; bowel sounds normal; no masses,  no organomegaly Extremities: extremities normal, atraumatic, no cyanosis or edema Neurologic: Alert and oriented X 3, normal strength and tone. Normal symmetric reflexes. Normal coordination and gait  ECOG PERFORMANCE STATUS: 1 - Symptomatic but completely ambulatory  Blood pressure 103/60, pulse 86, temperature 98.1 F (36.7 C), temperature source Oral, resp. rate 18, height 5\' 6"  (1.676 m), weight 170 lb 1.6 oz (77.157 kg), SpO2 95 %.  LABORATORY DATA: Lab Results  Component Value Date   WBC 1.7* 12/01/2014   HGB 11.0* 12/01/2014   HCT 33.9* 12/01/2014   MCV 100.4 12/01/2014   PLT 152 12/01/2014      Chemistry      Component Value Date/Time  NA 141 11/12/2014 0842   NA 137 07/11/2014 0840   K 4.3 11/12/2014 0842   K 4.1 07/11/2014 0840   CL 97 07/11/2014 0840   CO2 25 11/12/2014 0842   CO2 24 07/11/2014 0840   BUN 16.1 11/12/2014 0842   BUN 15 07/11/2014 0840   CREATININE 0.9 11/12/2014 0842   CREATININE 0.60 07/11/2014 0840      Component Value Date/Time   CALCIUM 10.2 11/12/2014 0842   CALCIUM 10.3 07/11/2014 0840   ALKPHOS 51 11/12/2014 0842   ALKPHOS 54 12/19/2013 0320   AST 34 11/12/2014 0842   AST 24 12/19/2013 0320   ALT 51 11/12/2014 0842   ALT 48* 12/19/2013 0320   BILITOT 0.40 11/12/2014 0842   BILITOT <0.2* 12/19/2013 0320       RADIOGRAPHIC STUDIES: No results found.  ASSESSMENT AND PLAN: This is a very pleasant 59 years old white female recently diagnosed with extensive stage small cell lung cancer status post whole brain irradiation in addition to 6 cycles of systemic chemotherapy with carboplatin and etoposide with  significant improvement in her disease but unfortunately a few months later she started having evidence for disease progression. The patient completed second line chemotherapy with cisplatin and irinotecan status post 4 cycles. This was discontinued secondary to disease progression. The patient is currently on second line chemotherapy with oral Temodar status post 1 cycle.  Her absolute neutrophil count is low today. I will start the patient on Granix 480 g subcutaneously for the next 3 days. I recommended for the patient to delay the start of cycle #2 by 1 week until improvement in her absolute neutrophil count. She will come back for follow-up visit in 5 weeks for reevaluation after repeating CT scan of the chest, abdomen and pelvis for restaging of her disease. She was advised to call immediately if she has any concerning symptoms in the interval. The patient voices understanding of current disease status and treatment options and is in agreement with the current care plan.  All questions were answered. The patient knows to call the clinic with any problems, questions or concerns. We can certainly see the patient much sooner if necessary.  Disclaimer: This note was dictated with voice recognition software. Similar sounding words can inadvertently be transcribed and may not be corrected upon review.

## 2014-12-01 NOTE — Patient Instructions (Signed)

## 2014-12-01 NOTE — Telephone Encounter (Signed)
gave and printed appt sched and avs fo rpt for APril and May

## 2014-12-02 ENCOUNTER — Ambulatory Visit (HOSPITAL_BASED_OUTPATIENT_CLINIC_OR_DEPARTMENT_OTHER): Payer: 59

## 2014-12-02 DIAGNOSIS — D709 Neutropenia, unspecified: Secondary | ICD-10-CM | POA: Diagnosis not present

## 2014-12-02 DIAGNOSIS — C3492 Malignant neoplasm of unspecified part of left bronchus or lung: Secondary | ICD-10-CM

## 2014-12-02 DIAGNOSIS — T451X5A Adverse effect of antineoplastic and immunosuppressive drugs, initial encounter: Principal | ICD-10-CM

## 2014-12-02 DIAGNOSIS — D701 Agranulocytosis secondary to cancer chemotherapy: Secondary | ICD-10-CM

## 2014-12-02 MED ORDER — TBO-FILGRASTIM 480 MCG/0.8ML ~~LOC~~ SOSY
480.0000 ug | PREFILLED_SYRINGE | Freq: Once | SUBCUTANEOUS | Status: AC
  Administered 2014-12-02: 480 ug via SUBCUTANEOUS
  Filled 2014-12-02: qty 0.8

## 2014-12-02 MED ORDER — FILGRASTIM 480 MCG/0.8ML IJ SOSY
480.0000 ug | PREFILLED_SYRINGE | Freq: Once | INTRAMUSCULAR | Status: DC
  Filled 2014-12-02: qty 0.8

## 2014-12-02 NOTE — Patient Instructions (Signed)

## 2014-12-03 ENCOUNTER — Ambulatory Visit (HOSPITAL_BASED_OUTPATIENT_CLINIC_OR_DEPARTMENT_OTHER): Payer: 59

## 2014-12-03 DIAGNOSIS — T451X5A Adverse effect of antineoplastic and immunosuppressive drugs, initial encounter: Principal | ICD-10-CM

## 2014-12-03 DIAGNOSIS — C3492 Malignant neoplasm of unspecified part of left bronchus or lung: Secondary | ICD-10-CM

## 2014-12-03 DIAGNOSIS — D709 Neutropenia, unspecified: Secondary | ICD-10-CM

## 2014-12-03 DIAGNOSIS — D701 Agranulocytosis secondary to cancer chemotherapy: Secondary | ICD-10-CM

## 2014-12-03 MED ORDER — TBO-FILGRASTIM 480 MCG/0.8ML ~~LOC~~ SOSY
480.0000 ug | PREFILLED_SYRINGE | Freq: Once | SUBCUTANEOUS | Status: AC
  Administered 2014-12-03: 480 ug via SUBCUTANEOUS
  Filled 2014-12-03: qty 0.8

## 2014-12-04 ENCOUNTER — Other Ambulatory Visit (HOSPITAL_BASED_OUTPATIENT_CLINIC_OR_DEPARTMENT_OTHER): Payer: 59

## 2014-12-04 DIAGNOSIS — C3412 Malignant neoplasm of upper lobe, left bronchus or lung: Secondary | ICD-10-CM

## 2014-12-04 DIAGNOSIS — C7931 Secondary malignant neoplasm of brain: Secondary | ICD-10-CM | POA: Diagnosis not present

## 2014-12-04 DIAGNOSIS — D709 Neutropenia, unspecified: Secondary | ICD-10-CM

## 2014-12-04 LAB — CBC WITH DIFFERENTIAL/PLATELET
BASO%: 0.2 % (ref 0.0–2.0)
BASOS ABS: 0 10*3/uL (ref 0.0–0.1)
EOS ABS: 0.1 10*3/uL (ref 0.0–0.5)
EOS%: 0.4 % (ref 0.0–7.0)
HCT: 36.1 % (ref 34.8–46.6)
HEMOGLOBIN: 12 g/dL (ref 11.6–15.9)
LYMPH#: 1.1 10*3/uL (ref 0.9–3.3)
LYMPH%: 9.5 % — AB (ref 14.0–49.7)
MCH: 33.4 pg (ref 25.1–34.0)
MCHC: 33.2 g/dL (ref 31.5–36.0)
MCV: 100.6 fL (ref 79.5–101.0)
MONO#: 1.3 10*3/uL — ABNORMAL HIGH (ref 0.1–0.9)
MONO%: 10.4 % (ref 0.0–14.0)
NEUT%: 79.5 % — AB (ref 38.4–76.8)
NEUTROS ABS: 9.5 10*3/uL — AB (ref 1.5–6.5)
PLATELETS: 126 10*3/uL — AB (ref 145–400)
RBC: 3.59 10*6/uL — ABNORMAL LOW (ref 3.70–5.45)
RDW: 13.4 % (ref 11.2–14.5)
WBC: 12 10*3/uL — AB (ref 3.9–10.3)

## 2014-12-04 LAB — COMPREHENSIVE METABOLIC PANEL (CC13)
ALBUMIN: 4.4 g/dL (ref 3.5–5.0)
ALT: 31 U/L (ref 0–55)
AST: 26 U/L (ref 5–34)
Alkaline Phosphatase: 63 U/L (ref 40–150)
Anion Gap: 13 mEq/L — ABNORMAL HIGH (ref 3–11)
BUN: 11 mg/dL (ref 7.0–26.0)
CALCIUM: 9.7 mg/dL (ref 8.4–10.4)
CO2: 24 meq/L (ref 22–29)
CREATININE: 0.8 mg/dL (ref 0.6–1.1)
Chloride: 104 mEq/L (ref 98–109)
EGFR: 89 mL/min/{1.73_m2} — ABNORMAL LOW (ref >90)
Glucose: 117 mg/dl (ref 70–140)
Potassium: 3.9 mEq/L (ref 3.5–5.1)
Sodium: 141 mEq/L (ref 136–145)
Total Bilirubin: 0.39 mg/dL (ref 0.20–1.20)
Total Protein: 7.3 g/dL (ref 6.4–8.3)

## 2014-12-04 LAB — MAGNESIUM (CC13): MAGNESIUM: 1.9 mg/dL (ref 1.5–2.5)

## 2014-12-08 ENCOUNTER — Telehealth: Payer: Self-pay | Admitting: Medical Oncology

## 2014-12-08 NOTE — Telephone Encounter (Signed)
Per Dr Julien Nordmann I instructed pt to start back on temodar today . She voices understanding.

## 2014-12-09 ENCOUNTER — Telehealth: Payer: Self-pay | Admitting: Medical Oncology

## 2014-12-09 NOTE — Telephone Encounter (Signed)
I left message for jaime that i talked to her mom yesterday and told her to start her temodar yesterday. Just a courtesy call to [facility]  to keep her updated

## 2014-12-11 ENCOUNTER — Other Ambulatory Visit: Payer: 59

## 2014-12-11 ENCOUNTER — Ambulatory Visit: Payer: 59 | Admitting: Internal Medicine

## 2014-12-12 ENCOUNTER — Telehealth: Payer: Self-pay | Admitting: *Deleted

## 2014-12-12 NOTE — Telephone Encounter (Signed)
PT.'S MOTHER IS DEAD. WHEN Carol Bond SPOKE TO HER MOTHER, SHE SAID "THERE SHE IS AGAIN". Carol Bond ASKED WHO AND PT. SAID "HER MOTHER HAD BEEN THERE ALL DAY". Carol Bond IS GOING TO HER MOTHER'S HOME AND WILL CALL THIS OFFICE WITH AN UPDATE.

## 2014-12-12 NOTE — Telephone Encounter (Signed)
CALLED PT.'S DAUGHTER,JAMIE. HER MOTHER APPEARS TO BE OK TODAY. INFORMED PT.'S DAUGHTER PER DR.MOHAMED-HER MOTHER MAY NEED TO GO TO THE EMERGENCY ROOM FOR AN EVALUATION. PT.'S DAUGHTER VOICES UNDERSTANDING.

## 2014-12-12 NOTE — Telephone Encounter (Signed)
She may need to go to the ED for evaluation.

## 2014-12-23 ENCOUNTER — Ambulatory Visit
Admission: RE | Admit: 2014-12-23 | Discharge: 2014-12-23 | Disposition: A | Discharge status: Home / Self Care | Payer: 59 | Source: Ambulatory Visit | Attending: Radiation Oncology | Admitting: Radiation Oncology

## 2014-12-23 DIAGNOSIS — C7931 Secondary malignant neoplasm of brain: Secondary | ICD-10-CM

## 2014-12-23 MED ORDER — GADOBENATE DIMEGLUMINE 529 MG/ML IV SOLN
12.0000 mL | Freq: Once | INTRAVENOUS | Status: AC | PRN
  Administered 2014-12-23: 12 mL via INTRAVENOUS

## 2014-12-26 ENCOUNTER — Other Ambulatory Visit: Payer: 59

## 2014-12-26 ENCOUNTER — Ambulatory Visit (HOSPITAL_COMMUNITY): Payer: 59

## 2014-12-28 ENCOUNTER — Encounter: Payer: Self-pay | Admitting: Radiation Therapy

## 2014-12-29 ENCOUNTER — Other Ambulatory Visit: Payer: Self-pay | Admitting: Radiation Therapy

## 2014-12-29 ENCOUNTER — Encounter: Payer: Self-pay | Admitting: *Deleted

## 2014-12-29 ENCOUNTER — Encounter: Payer: Self-pay | Admitting: Radiation Oncology

## 2014-12-29 ENCOUNTER — Telehealth: Payer: Self-pay | Admitting: Radiation Oncology

## 2014-12-29 ENCOUNTER — Ambulatory Visit
Admission: RE | Admit: 2014-12-29 | Discharge: 2014-12-29 | Disposition: A | Discharge status: Home / Self Care | Payer: 59 | Source: Ambulatory Visit | Attending: Radiation Oncology | Admitting: Radiation Oncology

## 2014-12-29 VITALS — BP 96/62 | HR 83 | Resp 16 | Wt 157.7 lb

## 2014-12-29 DIAGNOSIS — R531 Weakness: Secondary | ICD-10-CM

## 2014-12-29 DIAGNOSIS — C3492 Malignant neoplasm of unspecified part of left bronchus or lung: Secondary | ICD-10-CM

## 2014-12-29 DIAGNOSIS — Z515 Encounter for palliative care: Secondary | ICD-10-CM | POA: Diagnosis not present

## 2014-12-29 DIAGNOSIS — Z66 Do not resuscitate: Secondary | ICD-10-CM

## 2014-12-29 DIAGNOSIS — C7931 Secondary malignant neoplasm of brain: Secondary | ICD-10-CM

## 2014-12-29 NOTE — Progress Notes (Signed)
Patient ZO:XWRU BRYLYN Bond      DOB: 03/19/1956      EAV:[id number]     Consult Note from the Palliative Medicine Team at [facility] Requested by:     PCP: [name], MD Reason for Consultation              Phone                                                                                                                                Number:[phone]  Assessment of patients Current state:     Consult is for introduction to the concept of Palliative Medicine, clarification of Advanced Directives,  holistic support and symptom management as indicated  This NP Wadie Lessen reviewed medical records, received report from team, assessed the patient and then meet with Carol Bond, her husband and two daughters in  the outpatient radiation-oncology clinic.  A detailed discussion was had today regarding advanced directives.  Concepts specific to code status, artifical feeding and hydration, continued IV antibiotics and rehospitalization was had.  The difference between a aggressive medical intervention path  and a palliative comfort care path for this patient at this time was had.  Values and goals of care important to patient and family were attempted to be elicited.  Concept of Hospice and Palliative Care were discussed  Questions and concerns addressed.   Family encouraged to call with questions or concerns.      Goals of Care: 1.  Code Status:  DNR/DNI Patient has documented AD and HPOA.   2. Scope of Treatment:  At this time patient is open to all available and offered medcial interventions to prolong quality life.  She is hopeful that continuing chemotherapy agents will prolong quality life.  She is encouraged to continue conversation with her oncologists regarding the intention of offered therapies.   4. Symptom Management:   Fatigue:-Pace yourself -Plan your day -Include naps and breaks -schedule a relaxing day -get a little exercise -fuel the  body -consider complementary therapies   -deep breathing   -prayer/medication   -guided meditation   2.  Pain:   Patient reports Percocet is controlling her pain/ will continue   3.  Unsteady gait: discussed -safety interventions in the home                                                     -purchase of a Lif[employer]                                                     -utilization of walker                                                     -  Home PT referral   5. Psychosocial:  Emotional support offered to patient     Patient Documents Completed or Given: Document Given Completed  Advanced Directives Pkt    MOST X   DNR    Gone from My [facility]    Hard Choices X     Brief HPI:  59 years old white female recently diagnosed with extensive stage small cell lung cancer status post whole brain irradiation in addition to 6 cycles of systemic chemotherapy with carboplatin and etoposide with significant improvement in her disease but unfortunately a few months later she started having evidence for disease progression. The patient completed second line chemotherapy with cisplatin and irinotecan status post 4 cycles. This was discontinued secondary to disease progression. The patient is currently on second line chemotherapy with oral Temodar status post 1 cycle.    DIAGNOSIS: Lung cancer  Primary site: Lung (Left)  Staging method: AJCC 7th Edition  Clinical free text: Extensive stage small cell lung cancer  Clinical: Stage IV (T3, N2, M1b) signed by Curt Bears, MD on 12/17/2013 7:45 PM  Summary: Stage IV (T3, N2, M1b)   PRIOR THERAPY: 1) Whole brain irradiation under the care of Dr. Sondra Come. 2) Systemic chemotherapy with carboplatin for an AUC of 5 given on day 1, etoposide at 120 mg per meter square given on days 1, 2 and 3 with Neulasta support given on day 4. Status post 6 cycles. 3) Systemic chemotherapy with cisplatin at 30 mg/m and irinotecan 65 mg meter squared given on  days 1 and 8 every 3 weeks. Status post 4 cycles.  CURRENT THERAPY: Temodar 200 MG/M2 (360 mg/day) for 5 days every 4 weeks, status post 1 cycle. First dose started 11/03/2014.   ROS:  Headache, weakness/h/o falls, insomnia  PMH:  Past Medical History  Diagnosis Date  . Pulmonary embolism   . Hypothyroid   . Radiation 12/18/13-12/31/13    whole brain 30 gray, left central chest 30 gray  . 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 Brain cancer   . Diabetes     metformin     PSH: Past Surgical History  Procedure Laterality Date  . Abdominal hysterectomy    . Spine surgery    . Cervical fusion    . Video bronchoscopy with endobronchial ultrasound N/A 12/13/2013    Procedure: VIDEO BRONCHOSCOPY WITH ENDOBRONCHIAL ULTRASOUND;  Surgeon: Grace Isaac, MD;  Location: [facility];  Service: Thoracic;  Laterality: N/A;  . Portacath placement Left 12/13/2013    Procedure: INSERTION PORT-A-CATH;  Surgeon: Grace Isaac, MD;  Location: [facility];  Service: Thoracic;  Laterality: Left;   I have reviewed the [place] and SH and  If appropriate update it with new information. Allergies  Allergen Reactions  . Actos [Pioglitazone] Swelling and Other (See Comments)    Low blood sugar and swelling all over body no throat or tongue   . Vicodin [Hydrocodone-Acetaminophen] Itching   Scheduled Meds: Continuous Infusions: PRN Meds:.    There were no vitals taken for this visit.   PPS: 40 %  No intake or output data in the 24 hours ending 12/29/14 1401  Physical Exam:  General: alert and oriented, pleasant  HEENT:  Moist buccal membranes, no exudate Ext: without edema Neuro:needs support in position change, unsteady gait  Labs: CBC    Component Value Date/Time   WBC 12.0* 12/04/2014 1031   WBC 3.4* 07/11/2014 0840   RBC  3.59* 12/04/2014 1031   RBC 3.93 07/11/2014 0840   HGB 12.0 12/04/2014 1031   HGB 11.8* 07/11/2014 0840   HCT 36.1 12/04/2014 1031   HCT  36.4 07/11/2014 0840   PLT 126* 12/04/2014 1031   PLT 159 07/11/2014 0840   MCV 100.6 12/04/2014 1031   MCV 92.6 07/11/2014 0840   MCH 33.4 12/04/2014 1031   MCH 30.0 07/11/2014 0840   MCHC 33.2 12/04/2014 1031   MCHC 32.4 07/11/2014 0840   RDW 13.4 12/04/2014 1031   RDW 13.8 07/11/2014 0840   LYMPHSABS 1.1 12/04/2014 1031   LYMPHSABS 1.4 12/17/2013 0620   MONOABS 1.3* 12/04/2014 1031   MONOABS 0.4 12/17/2013 0620   EOSABS 0.1 12/04/2014 1031   EOSABS 0.1 12/17/2013 0620   BASOSABS 0.0 12/04/2014 1031   BASOSABS 0.0 12/17/2013 0620    BMET    Component Value Date/Time   NA 141 12/04/2014 1033   NA 137 07/11/2014 0840   K 3.9 12/04/2014 1033   K 4.1 07/11/2014 0840   CL 97 07/11/2014 0840   CO2 24 12/04/2014 1033   CO2 24 07/11/2014 0840   GLUCOSE 117 12/04/2014 1033   GLUCOSE 137* 07/11/2014 0840   BUN 11.0 12/04/2014 1033   BUN 15 07/11/2014 0840   CREATININE 0.8 12/04/2014 1033   CREATININE 0.60 07/11/2014 0840   CALCIUM 9.7 12/04/2014 1033   CALCIUM 10.3 07/11/2014 0840   GFRNONAA >90 07/11/2014 0840   GFRAA >90 07/11/2014 0840    CMP     Component Value Date/Time   NA 141 12/04/2014 1033   NA 137 07/11/2014 0840   K 3.9 12/04/2014 1033   K 4.1 07/11/2014 0840   CL 97 07/11/2014 0840   CO2 24 12/04/2014 1033   CO2 24 07/11/2014 0840   GLUCOSE 117 12/04/2014 1033   GLUCOSE 137* 07/11/2014 0840   BUN 11.0 12/04/2014 1033   BUN 15 07/11/2014 0840   CREATININE 0.8 12/04/2014 1033   CREATININE 0.60 07/11/2014 0840   CALCIUM 9.7 12/04/2014 1033   CALCIUM 10.3 07/11/2014 0840   PROT 7.3 12/04/2014 1033   PROT 7.0 12/19/2013 0320   ALBUMIN 4.4 12/04/2014 1033   ALBUMIN 3.6 12/19/2013 0320   AST 26 12/04/2014 1033   AST 24 12/19/2013 0320   ALT 31 12/04/2014 1033   ALT 48* 12/19/2013 0320   ALKPHOS 63 12/04/2014 1033   ALKPHOS 54 12/19/2013 0320   BILITOT 0.39 12/04/2014 1033   BILITOT <0.2* 12/19/2013 0320   GFRNONAA >90 07/11/2014 0840   GFRAA >90  07/11/2014 0840   ECOG PERFORMANCE STATUS* (Eastern Cooperative Oncology Group)  0 Fully active, able to continue with all pre-disease activities without restriction. Pt score  1 Restricted in physically strenuous activity but ambulatory and able to carry out work of a light or sedentary nature, e.g., light house work, office work.   2 Ambulatory and capable of all self-care but unable to carry out any work activities. Up and about more than 50% of waking hours.  2  3 Capable of only limited self-care. Confined to bed or chair more than 50% of waking hours.   4 Completely disabled. Cannot carry on any self-care. Totally confined to bed or chair.   5 Dead.    As published in Am. J. Clin. Oncol.: Eustace Pen, M.M., Colon Flattery., Alzada, D.C., Horton, Sharen Hint., Drexel Iha, P.P.: Toxicity And Response Criteria Of The [facility] Group. [place] 5:993-570, 1982.  The ECOG  Performance Status is in the public domain therefore available for public use. To duplicate the scale, please cite the reference above and credit the [facility], Tyler Pita M.D., Group Chair    Time In Time Out Total Time Spent with Patient Total Overall Time  1000 1120 75 min 80 min    Greater than 50%  of this time was spent counseling and coordinating care related to the above assessment and plan.   Wadie Lessen NP  Palliative Medicine Team Team Phone # [phone] Pager [phone]  Discussed with Everitt Amber RN

## 2014-12-29 NOTE — [facility] Oncology Navigator Note (Unsigned)
Spoke with patient today at [facility] during her rad onc visit.  I spoke with her and her family.  She states she is doing well.  Family states she is unsteady on her feet and needs to use walker.  I asked if they had a walker and they stated yes.  I asked if she would like to go to PT to help learn proper mechanics in how to use walker.  She stated no.  I explained the importance of using walker to keep her safe and fall prevention.    Patient stated she is also hurting in her shoulders.  I asked when this started, she and family stated months ago.  I asked about pain medication.  Family states she is not using it.  We discussed the importance of keeping herself safe and comfortable.  She verbalized understanding of our conversation.  Family is very supportive and by her side.

## 2014-12-29 NOTE — Progress Notes (Signed)
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   No diagnosis found.  Interval Since Last Radiation:  2  months  Narrative:  The patient returns today for routine follow-up.  BP low. Reports floaters and diplopia. Denies seizure activity . Reports occasional ringing in her ears. Reports nausea and vomiting. Reports that she wakes up each day with a headache that last most of the day. Takes Percocet for headaches. Explains the headaches are in one specific area each time. Continues Bactrim and Temodar. Unsteady gait noted. Reports decreased appetite. 13 lb weight loss noted since 4/4.                               ALLERGIES:  is allergic to actos and vicodin.  Meds: Current Outpatient Prescriptions  Medication Sig Dispense Refill  . albuterol (PROVENTIL HFA;VENTOLIN HFA) 108 (90 BASE) MCG/ACT inhaler Inhale 2 puffs into the lungs every 6 (six) hours as needed for wheezing or shortness of breath. 1 Inhaler 2  . Canagliflozin (INVOKANA) 300 MG TABS Take 1 tablet by mouth daily before breakfast.    . escitalopram (LEXAPRO) 10 MG tablet Take 10 mg by mouth at bedtime.     Marland Kitchen esomeprazole (NEXIUM) 20 MG capsule Take 20 mg by mouth daily as needed (for acid reflex).    . fenofibrate 160 MG tablet Take 160 mg by mouth at bedtime.     Marland Kitchen glimepiride (AMARYL) 4 MG tablet Take 4 mg by mouth daily with breakfast.    . Glucose Blood (FREESTYLE LITE TEST VI)     . levothyroxine (SYNTHROID, LEVOTHROID) 150 MCG tablet Take 150 mcg by mouth daily before breakfast.    . LORazepam (ATIVAN) 1 MG tablet Take 0.5-1 mg by mouth 2 (two) times daily.     Marland Kitchen  losartan (COZAAR) 50 MG tablet Take 50 mg by mouth at bedtime.     . ondansetron (ZOFRAN) 8 MG tablet Take 8 mg by mouth every 8 (eight) hours as needed for nausea or vomiting.     Marland Kitchen oxyCODONE-acetaminophen (PERCOCET) 5-325 MG per tablet Take 1-2 tablets by mouth every 4 (four) hours as needed. 40 tablet 0  . sitaGLIPtin-metformin (JANUMET) 50-1000 MG per tablet Take 1 tablet by mouth 2 (two) times daily with a meal.    . sulfamethoxazole-trimethoprim (BACTRIM DS,SEPTRA DS) 800-160 MG per tablet One tablets by mouth twice a day on Mondays and Thursdays weekly. 30 tablet 1  . temozolomide (TEMODAR) 180 MG capsule Take 2 capsules (360 mg total) by mouth daily. May take on an empty stomach or at bedtime to decrease nausea & vomiting. 10 capsule 2  . vitamin B-12 (CYANOCOBALAMIN) 100 MCG tablet Take 100 mcg by mouth daily.     No current facility-administered medications for this encounter.   Facility-Administered Medications Ordered in Other Encounters  Medication Dose Route Frequency Provider Last Rate Last Dose  . sodium chloride 0.9 % injection 10 mL  10 mL Intracatheter PRN Curt Bears, MD        Physical Findings: The patient is in no acute distress. Patient is alert and oriented.  weight is 157 lb 11.2 oz (71.532 kg). Her blood pressure is 96/62 and her pulse is 83. Her respiration is 16. .  No significant changes.  Ear Exam: Fluid in middle ear on left wax on right.  Lab Findings: Lab Results  Component Value Date   WBC 12.0* 12/04/2014   WBC 3.4* 07/11/2014   HGB 12.0 12/04/2014   HGB 11.8* 07/11/2014   HCT 36.1 12/04/2014   HCT 36.4 07/11/2014   PLT 126* 12/04/2014   PLT 159 07/11/2014    Lab Results  Component Value Date   NA 141 12/04/2014   NA 137 07/11/2014   K 3.9 12/04/2014   K 4.1 07/11/2014   CHLORIDE 104 12/04/2014   CO2 24 12/04/2014   CO2 24 07/11/2014   GLUCOSE 117 12/04/2014   GLUCOSE 137* 07/11/2014   BUN 11.0 12/04/2014   BUN 15 07/11/2014    CREATININE 0.8 12/04/2014   CREATININE 0.60 07/11/2014   BILITOT 0.39 12/04/2014   BILITOT <0.2* 12/19/2013   ALKPHOS 63 12/04/2014   ALKPHOS 54 12/19/2013   AST 26 12/04/2014   AST 24 12/19/2013   ALT 31 12/04/2014   ALT 48* 12/19/2013   PROT 7.3 12/04/2014   PROT 7.0 12/19/2013   ALBUMIN 4.4 12/04/2014   ALBUMIN 3.6 12/19/2013   CALCIUM 9.7 12/04/2014   CALCIUM 10.3 07/11/2014   ANIONGAP 13* 12/04/2014   ANIONGAP 16* 07/11/2014    Radiographic Findings: Mr Jeri Cos Wo Contrast  12/23/2014   CLINICAL DATA:  59 year old female with numerous recurrent brain metastases following previous whole brain radiation and salvage stereotactic 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  From 10/13/2014-3/1/20165 the whole brain received 30 Gy in 12 fractions of 2.5 Gy.  Restaging.  Subsequent encounter.  EXAM: MRI HEAD WITHOUT AND WITH CONTRAST  TECHNIQUE: Multiplanar, multiecho pulse sequences of the brain and surrounding structures were obtained without and with intravenous contrast.  CONTRAST:  12m MULTIHANCE GADOBENATE DIMEGLUMINE 529 MG/ML IV SOLN  COMPARISON:  Brain MRI 10/03/2014 and earlier.  FINDINGS: All of the small enhancing brain metastases identified in February have regressed. A number of these are now only faintly visible following contrast.  No new metastasis identified. 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  Suggestion of a small nonenhancing lacunar infarct in the left hemisphere white matter on series 4, image 31. No other restricted diffusion or evidence of acute infarction. Major intracranial vascular flow voids are stable.  No ventriculomegaly. No intracranial mass effect. No acute intracranial hemorrhage identified. Some small previously identified areas of metastasis related edema have resolved. Underlying Confluent white matter T2 and FLAIR hyperintensity have not significantly changed. Negative pituitary and cervicomedullary junction.  Grossly negative visualized cervical spinal cord.  Visible bone marrow signal is stable and within normal limits. Visualized orbit soft tissues are within normal limits. Paranasal sinuses remain clear. There are new bilateral mastoid effusions, greater on the left. Negative visualized pharynx. Other internal auditory structures appear normal. Visualized scalp soft tissues are within normal limits.  IMPRESSION: 1. All previously identified small brain metastases have regressed following repeat whole brain radiation, many now barely visible. No new metastasis identified. 2. New mastoid effusions likely post treatment related.   Electronically Signed   By: HGenevie AnnM.D.   On: 12/23/2014 16:53    Impression:  The patient is recovering from the effects of radiation. MRI shows improvement after salvage brain re-irradiation. Symptoms currently experienced are likely side effects of radiation treatments.  Plan:  Follow-up MRI in 3 months.  Recommended meeting with Stanton Kidney from palliative care. Recommended meeting with ENT if ear symptoms persist.   _____________________________________  [name]. Tammi Klippel, M.D.   This document serves as a record of services personally performed by Tyler Pita, MD. It was created on his behalf by Derek Mound, a trained medical scribe. The creation of this record is based on the scribe's personal observations and the provider's statements to them. This document has been checked and approved by the attending provider.

## 2014-12-29 NOTE — Telephone Encounter (Signed)
Per Wadie Lessen, NP called in trazodone 25 mg qhs to [facility] at [facility].

## 2014-12-29 NOTE — Progress Notes (Signed)
BP low. Reports floaters and diplopia. Denies seizure activity . Reports occasional ringing in her ears. Reports nausea and vomiting. Reports that she wakes up each day with a headache that last most of the day. Takes Percocet for headaches. Explains the headaches are in one specific area each time. Continues Bactrim and Temodar. Unsteady gait noted. Reports decreased appetite. 13 lb weight loss noted since 4/4.

## 2015-01-01 ENCOUNTER — Encounter (HOSPITAL_COMMUNITY): Payer: Self-pay

## 2015-01-01 ENCOUNTER — Ambulatory Visit (HOSPITAL_COMMUNITY)
Admission: RE | Admit: 2015-01-01 | Discharge: 2015-01-01 | Disposition: A | Discharge status: Home / Self Care | Payer: 59 | Source: Ambulatory Visit | Attending: Internal Medicine | Admitting: Internal Medicine

## 2015-01-01 DIAGNOSIS — C7931 Secondary malignant neoplasm of brain: Secondary | ICD-10-CM | POA: Diagnosis not present

## 2015-01-01 DIAGNOSIS — C782 Secondary malignant neoplasm of pleura: Secondary | ICD-10-CM | POA: Diagnosis not present

## 2015-01-01 DIAGNOSIS — D701 Agranulocytosis secondary to cancer chemotherapy: Secondary | ICD-10-CM

## 2015-01-01 DIAGNOSIS — C3490 Malignant neoplasm of unspecified part of unspecified bronchus or lung: Secondary | ICD-10-CM | POA: Insufficient documentation

## 2015-01-01 DIAGNOSIS — E279 Disorder of adrenal gland, unspecified: Secondary | ICD-10-CM | POA: Diagnosis not present

## 2015-01-01 DIAGNOSIS — C3492 Malignant neoplasm of unspecified part of left bronchus or lung: Secondary | ICD-10-CM

## 2015-01-01 DIAGNOSIS — T451X5A Adverse effect of antineoplastic and immunosuppressive drugs, initial encounter: Secondary | ICD-10-CM

## 2015-01-01 MED ORDER — IOHEXOL 300 MG/ML  SOLN
100.0000 mL | Freq: Once | INTRAMUSCULAR | Status: AC | PRN
  Administered 2015-01-01: 100 mL via INTRAVENOUS

## 2015-01-05 ENCOUNTER — Other Ambulatory Visit (HOSPITAL_BASED_OUTPATIENT_CLINIC_OR_DEPARTMENT_OTHER): Payer: 59

## 2015-01-05 ENCOUNTER — Telehealth: Payer: Self-pay | Admitting: Internal Medicine

## 2015-01-05 ENCOUNTER — Ambulatory Visit (HOSPITAL_BASED_OUTPATIENT_CLINIC_OR_DEPARTMENT_OTHER): Payer: 59 | Admitting: Internal Medicine

## 2015-01-05 ENCOUNTER — Encounter: Payer: Self-pay | Admitting: Internal Medicine

## 2015-01-05 VITALS — BP 94/67 | HR 84 | Temp 97.7°F | Resp 18 | Ht 66.0 in | Wt 157.0 lb

## 2015-01-05 DIAGNOSIS — T451X5A Adverse effect of antineoplastic and immunosuppressive drugs, initial encounter: Secondary | ICD-10-CM

## 2015-01-05 DIAGNOSIS — C3492 Malignant neoplasm of unspecified part of left bronchus or lung: Secondary | ICD-10-CM

## 2015-01-05 DIAGNOSIS — C7931 Secondary malignant neoplasm of brain: Secondary | ICD-10-CM | POA: Diagnosis not present

## 2015-01-05 DIAGNOSIS — Z515 Encounter for palliative care: Secondary | ICD-10-CM | POA: Insufficient documentation

## 2015-01-05 DIAGNOSIS — Z66 Do not resuscitate: Secondary | ICD-10-CM | POA: Insufficient documentation

## 2015-01-05 DIAGNOSIS — R531 Weakness: Secondary | ICD-10-CM | POA: Insufficient documentation

## 2015-01-05 DIAGNOSIS — C3490 Malignant neoplasm of unspecified part of unspecified bronchus or lung: Secondary | ICD-10-CM

## 2015-01-05 DIAGNOSIS — D701 Agranulocytosis secondary to cancer chemotherapy: Secondary | ICD-10-CM

## 2015-01-05 LAB — CBC WITH DIFFERENTIAL/PLATELET
BASO%: 0.4 % (ref 0.0–2.0)
Basophils Absolute: 0 10*3/uL (ref 0.0–0.1)
EOS%: 0.8 % (ref 0.0–7.0)
Eosinophils Absolute: 0 10*3/uL (ref 0.0–0.5)
HCT: 36.6 % (ref 34.8–46.6)
HGB: 12.2 g/dL (ref 11.6–15.9)
LYMPH#: 0.4 10*3/uL — AB (ref 0.9–3.3)
LYMPH%: 17.5 % (ref 14.0–49.7)
MCH: 32.4 pg (ref 25.1–34.0)
MCHC: 33.3 g/dL (ref 31.5–36.0)
MCV: 97.1 fL (ref 79.5–101.0)
MONO#: 0.3 10*3/uL (ref 0.1–0.9)
MONO%: 12.7 % (ref 0.0–14.0)
NEUT%: 68.6 % (ref 38.4–76.8)
NEUTROS ABS: 1.7 10*3/uL (ref 1.5–6.5)
PLATELETS: 118 10*3/uL — AB (ref 145–400)
RBC: 3.77 10*6/uL (ref 3.70–5.45)
RDW: 13.2 % (ref 11.2–14.5)
WBC: 2.5 10*3/uL — AB (ref 3.9–10.3)

## 2015-01-05 LAB — URINALYSIS, MICROSCOPIC - CHCC
BLOOD: NEGATIVE
Bilirubin (Urine): NEGATIVE
Glucose: 500 mg/dL
Ketones: NEGATIVE mg/dL
NITRITE: POSITIVE
PH: 5 (ref 4.6–8.0)
Protein: <30 mg/dL
SPECIFIC GRAVITY, URINE: 1.03 (ref 1.003–1.035)
UROBILINOGEN UR: 0.2 mg/dL (ref 0.2–1)

## 2015-01-05 LAB — COMPREHENSIVE METABOLIC PANEL (CC13)
ALT: 24 U/L (ref 0–55)
AST: 18 U/L (ref 5–34)
Albumin: 4.2 g/dL (ref 3.5–5.0)
Alkaline Phosphatase: 46 U/L (ref 40–150)
Anion Gap: 10 mEq/L (ref 3–11)
BILIRUBIN TOTAL: 0.34 mg/dL (ref 0.20–1.20)
BUN: 16.9 mg/dL (ref 7.0–26.0)
CO2: 22 meq/L (ref 22–29)
CREATININE: 0.7 mg/dL (ref 0.6–1.1)
Calcium: 10 mg/dL (ref 8.4–10.4)
Chloride: 109 mEq/L (ref 98–109)
Glucose: 113 mg/dl (ref 70–140)
Potassium: 3.9 mEq/L (ref 3.5–5.1)
Sodium: 141 mEq/L (ref 136–145)
Total Protein: 6.9 g/dL (ref 6.4–8.3)

## 2015-01-05 LAB — MAGNESIUM (CC13): MAGNESIUM: 2.1 mg/dL (ref 1.5–2.5)

## 2015-01-05 NOTE — Telephone Encounter (Signed)
Gave and printed appt sched and avs for pt for June °

## 2015-01-05 NOTE — Progress Notes (Signed)
[place] Telephone:([phone]) [phone]   Fax:(336) [place], [place] [address]  DIAGNOSIS: Lung cancer  Primary site: Lung (Left)  Staging method: AJCC 7th Edition  Clinical free text: Extensive stage small cell lung cancer  Clinical: Stage IV (T3, N2, M1b) signed by Curt Bears, MD on 12/17/2013 7:45 PM  Summary: Stage IV (T3, N2, M1b)   PRIOR THERAPY: 1) Whole brain irradiation under the care of Dr. Sondra Come. 2) Systemic chemotherapy with carboplatin for an AUC of 5 given on day 1, etoposide at 120 mg per meter square given on days 1, 2 and 3 with Neulasta support given on day 4. Status post 6 cycles. 3) Systemic chemotherapy with cisplatin at 30 mg/m and irinotecan 65 mg meter squared given on days 1 and 8 every 3 weeks. Status post 4 cycles.  CURRENT THERAPY: Temodar 200 MG/M2 (360 mg/day) for 5 days every 4 weeks, status post 2 cycles. First dose started 11/03/2014.  DISEASE STAGE: Lung cancer  Primary site: Lung (Left)  Staging method: AJCC 7th Edition  Clinical free text: Extensive stage small cell lung cancer  Clinical: Stage IV (T3, N2, M1b) signed by Curt Bears, MD on 12/17/2013 7:45 PM  Summary: Stage IV (T3, N2, M1b)  CHEMOTHERAPY INTENT: Palliative  CURRENT # OF CHEMOTHERAPY CYCLES: 3 CURRENT ANTIEMETICS: none  CURRENT SMOKING STATUS: Former smoker, quit July 2014  ORAL CHEMOTHERAPY AND CONSENT: n/a  CURRENT BISPHOSPHONATES USE: none  PAIN MANAGEMENT: oxycodone  NARCOTICS INDUCED CONSTIPATION: none  LIVING WILL AND CODE STATUS:    INTERVAL HISTORY: Carol Bond 59 y.o. female returns to the clinic today for followup visit accompanied by her husband and 2 daughters. She is currently on treatment with oral Temodar and tolerating her treatment fairly well except for mild nausea, controlled with Zofran. She continues to complain of mild fatigue and occasional  moments of confusion. She sees her dead mother frequently these days. She denied having any current significant nausea or vomiting, no fever or chills. She has no significant chest pain but continues to have shortness breath with exertion was no cough or hemoptysis. The patient denied having any significant weight loss or night sweats.  She had repeat CT scan of the chest, abdomen and pelvis performed recently and she is here for evaluation and discussion of her scan results.  MEDICAL HISTORY: Past Medical History  Diagnosis Date  . Pulmonary embolism   . Hypothyroid   . Radiation 12/18/13-12/31/13    whole brain 30 gray, left central chest 30 gray  . Diabetes     metformin  . 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 Brain cancer     ALLERGIES:  is allergic to actos and vicodin.  MEDICATIONS:  Current Outpatient Prescriptions  Medication Sig Dispense Refill  . albuterol (PROVENTIL HFA;VENTOLIN HFA) 108 (90 BASE) MCG/ACT inhaler Inhale 2 puffs into the lungs every 6 (six) hours as needed for wheezing or shortness of breath. 1 Inhaler 2  . Canagliflozin (INVOKANA) 300 MG TABS Take 1 tablet by mouth daily before breakfast.    . escitalopram (LEXAPRO) 10 MG tablet Take 10 mg by mouth at bedtime.     Marland Kitchen esomeprazole (NEXIUM) 20 MG capsule Take 20 mg by mouth daily as needed (for acid reflex).    . fenofibrate 160 MG tablet Take 160 mg by mouth at  bedtime.     Marland Kitchen glimepiride (AMARYL) 4 MG tablet Take 4 mg by mouth daily with breakfast.    . Glucose Blood (FREESTYLE LITE TEST VI)     . levothyroxine (SYNTHROID, LEVOTHROID) 150 MCG tablet Take 150 mcg by mouth daily before breakfast.    . LORazepam (ATIVAN) 1 MG tablet Take 0.5-1 mg by mouth 2 (two) times daily.     Marland Kitchen losartan (COZAAR) 50 MG tablet Take 50 mg by mouth at bedtime.     . ondansetron (ZOFRAN) 8 MG tablet Take 8 mg by mouth every 8 (eight) hours as needed for nausea or vomiting.     Marland Kitchen  oxyCODONE-acetaminophen (PERCOCET) 5-325 MG per tablet Take 1-2 tablets by mouth every 4 (four) hours as needed. 40 tablet 0  . sitaGLIPtin-metformin (JANUMET) 50-1000 MG per tablet Take 1 tablet by mouth 2 (two) times daily with a meal.    . sulfamethoxazole-trimethoprim (BACTRIM DS,SEPTRA DS) 800-160 MG per tablet One tablets by mouth twice a day on Mondays and Thursdays weekly. 30 tablet 1  . temozolomide (TEMODAR) 180 MG capsule Take 2 capsules (360 mg total) by mouth daily. May take on an empty stomach or at bedtime to decrease nausea & vomiting. 10 capsule 2  . traZODone (DESYREL) 25 mg TABS tablet Take 25 mg by mouth at bedtime.    . vitamin B-12 (CYANOCOBALAMIN) 100 MCG tablet Take 100 mcg by mouth daily.     No current facility-administered medications for this visit.   Facility-Administered Medications Ordered in Other Visits  Medication Dose Route Frequency Provider Last Rate Last Dose  . sodium chloride 0.9 % injection 10 mL  10 mL Intracatheter PRN Curt Bears, MD        SURGICAL HISTORY:  Past Surgical History  Procedure Laterality Date  . Abdominal hysterectomy    . Spine surgery    . Cervical fusion    . Video bronchoscopy with endobronchial ultrasound N/A 12/13/2013    Procedure: VIDEO BRONCHOSCOPY WITH ENDOBRONCHIAL ULTRASOUND;  Surgeon: Grace Isaac, MD;  Location: [facility];  Service: Thoracic;  Laterality: N/A;  . Portacath placement Left 12/13/2013    Procedure: INSERTION PORT-A-CATH;  Surgeon: Grace Isaac, MD;  Location: [facility];  Service: Thoracic;  Laterality: Left;    REVIEW OF SYSTEMS:  Constitutional: positive for fatigue Eyes: negative Ears, nose, mouth, throat, and face: negative Respiratory: positive for dyspnea on exertion Cardiovascular: negative Gastrointestinal: negative Genitourinary:negative Integument/breast: negative Hematologic/lymphatic: negative Musculoskeletal:positive for muscle weakness Neurological: positive for memory problems  and weakness Behavioral/Psych: negative Endocrine: negative Allergic/Immunologic: negative   PHYSICAL EXAMINATION: General appearance: alert, cooperative, fatigued and no distress Head: Normocephalic, without obvious abnormality, atraumatic Neck: no adenopathy, no JVD, supple, symmetrical, trachea midline and thyroid not enlarged, symmetric, no tenderness/mass/nodules Lymph nodes: Cervical, supraclavicular, and axillary nodes normal. Resp: clear to auscultation bilaterally Back: symmetric, no curvature. ROM normal. No CVA tenderness. Cardio: regular rate and rhythm, S1, S2 normal, no murmur, click, rub or gallop GI: soft, non-tender; bowel sounds normal; no masses,  no organomegaly Extremities: extremities normal, atraumatic, no cyanosis or edema Neurologic: Alert and oriented X 3, normal strength and tone. Normal symmetric reflexes. Normal coordination and gait  ECOG PERFORMANCE STATUS: 1 - Symptomatic but completely ambulatory  Blood pressure 94/67, pulse 84, temperature 97.7 F (36.5 C), temperature source Oral, resp. rate 18, height '5\' 6"'$  (1.676 m), weight 157 lb (71.215 kg).  LABORATORY DATA: Lab Results  Component Value Date   WBC 12.0* 12/04/2014  HGB 12.0 12/04/2014   HCT 36.1 12/04/2014   MCV 100.6 12/04/2014   PLT 126* 12/04/2014      Chemistry      Component Value Date/Time   NA 141 12/04/2014 1033   NA 137 07/11/2014 0840   K 3.9 12/04/2014 1033   K 4.1 07/11/2014 0840   CL 97 07/11/2014 0840   CO2 24 12/04/2014 1033   CO2 24 07/11/2014 0840   BUN 11.0 12/04/2014 1033   BUN 15 07/11/2014 0840   CREATININE 0.8 12/04/2014 1033   CREATININE 0.60 07/11/2014 0840      Component Value Date/Time   CALCIUM 9.7 12/04/2014 1033   CALCIUM 10.3 07/11/2014 0840   ALKPHOS 63 12/04/2014 1033   ALKPHOS 54 12/19/2013 0320   AST 26 12/04/2014 1033   AST 24 12/19/2013 0320   ALT 31 12/04/2014 1033   ALT 48* 12/19/2013 0320   BILITOT 0.39 12/04/2014 1033   BILITOT  <0.2* 12/19/2013 0320       RADIOGRAPHIC STUDIES: Ct Chest W Contrast  01/01/2015   CLINICAL DATA:  Small cell lung cancer with metastatic disease to the brain.  EXAM: CT CHEST, ABDOMEN, AND PELVIS WITH CONTRAST  TECHNIQUE: Multidetector CT imaging of the chest, abdomen and pelvis was performed following the standard protocol during bolus administration of intravenous contrast.  CONTRAST:  177m OMNIPAQUE IOHEXOL 300 MG/ML  SOLN  COMPARISON:  Multiple exams, including 10/20/2014  FINDINGS: CT CHEST FINDINGS  Mediastinum/Nodes: Upper mediastinal node 1.4 cm in short axis image 11 series 2, formerly 1.8 cm by my measurement. Left mediastinal/hilar mass, 5.0 by 3.3 cm, formerly 5.8 by 3.5 cm, obstructing anterior left upper lobe pulmonary arterial branch and with presumably extrinsic narrowing of the left lower lobe pulmonary artery. Lower paratracheal node short axis diameter 0.7 cm on image 22 series 2, formerly 0.9 cm.  Lungs/Pleura: Pleural metastatic disease noted on the left with several notable pleural nodules. Along the left heart border, a pleural mass measures 1.3 cm in thickness, previously 1.4 cm. Trace left pleural effusion. Scarring and volume loss in the left lung similar to prior. Small peripheral nodules in the left lower lobe appear mildly reduced in size, for example a prior 5 mm nodule currently measures 4 mm in diameter.  Musculoskeletal: Unremarkable  CT ABDOMEN PELVIS FINDINGS  Hepatobiliary: 1.3 cm enhancing lesion in segment 2 of the liver, image 43 series 2.  Pancreas: Unremarkable  Spleen: Unremarkable  Adrenals/Urinary Tract: Left adrenal mass 2.2 by 1.3 cm on image 55 series 2, formerly 3.2 by 2.7 cm.  Stomach/Bowel: Unremarkable  Vascular/Lymphatic: Aortoiliac atherosclerotic vascular disease.  Reproductive: Uterus absent. 1.9 by 1.3 cm dense lesion along the anterior margin of the vagina, essentially stable, not hypermetabolic on prior PET-CT.  Other: No supplemental non-categorized  findings.  Musculoskeletal: Mild lower lumbar spondylosis and degenerative disc disease.  IMPRESSION: 1. Improved dominant left hilar mass and approved adenopathy and pleural metastatic disease in the chest. The left adrenal mass is also significantly improved. 2. 1.3 cm enhancing lesion in segment 2 of the liver, no change, not previously hypermetabolic, possible hemangioma. 3. Dense lesion along the anterior vaginal margin, query Skene duct cyst or Gartner duct cyst, not previously hypermetabolic.   Electronically Signed   By: WVan ClinesM.D.   On: 01/01/2015 12:34   Mr BJeri CosWGMContrast  12/23/2014   CLINICAL DATA:  59year old female with numerous recurrent brain metastases following previous whole brain radiation and salvage stereotactic radiosurgery from small  cell lung cancer.  From 10/13/2014-3/1/20165 the whole brain received 30 Gy in 12 fractions of 2.5 Gy.  Restaging.  Subsequent encounter.  EXAM: MRI HEAD WITHOUT AND WITH CONTRAST  TECHNIQUE: Multiplanar, multiecho pulse sequences of the brain and surrounding structures were obtained without and with intravenous contrast.  CONTRAST:  36m MULTIHANCE GADOBENATE DIMEGLUMINE 529 MG/ML IV SOLN  COMPARISON:  Brain MRI 10/03/2014 and earlier.  FINDINGS: All of the small enhancing brain metastases identified in February have regressed. A number of these are now only faintly visible following contrast.  No new metastasis identified. 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  Suggestion of a small nonenhancing lacunar infarct in the left hemisphere white matter on series 4, image 31. No other restricted diffusion or evidence of acute infarction. Major intracranial vascular flow voids are stable.  No ventriculomegaly. No intracranial mass effect. No acute intracranial hemorrhage identified. Some small previously identified areas of metastasis related edema have resolved. Underlying Confluent white matter T2  and FLAIR hyperintensity have not significantly changed. Negative pituitary and cervicomedullary junction. Grossly negative visualized cervical spinal cord.  Visible bone marrow signal is stable and within normal limits. Visualized orbit soft tissues are within normal limits. Paranasal sinuses remain clear. There are new bilateral mastoid effusions, greater on the left. Negative visualized pharynx. Other internal auditory structures appear normal. Visualized scalp soft tissues are within normal limits.  IMPRESSION: 1. All previously identified small brain metastases have regressed following repeat whole brain radiation, many now barely visible. No new metastasis identified. 2. New mastoid effusions likely post treatment related.   Electronically Signed   By: HGenevie AnnM.D.   On: 12/23/2014 16:53   Ct Abdomen Pelvis W Contrast  01/01/2015   CLINICAL DATA:  Small cell lung cancer with metastatic disease to the brain.  EXAM: CT CHEST, ABDOMEN, AND PELVIS WITH CONTRAST  TECHNIQUE: Multidetector CT imaging of the chest, abdomen and pelvis was performed following the standard protocol during bolus administration of intravenous contrast.  CONTRAST:  1069mOMNIPAQUE IOHEXOL 300 MG/ML  SOLN  COMPARISON:  Multiple exams, including 10/20/2014  FINDINGS: CT CHEST FINDINGS  Mediastinum/Nodes: Upper mediastinal node 1.4 cm in short axis image 11 series 2, formerly 1.8 cm by my measurement. Left mediastinal/hilar mass, 5.0 by 3.3 cm, formerly 5.8 by 3.5 cm, obstructing anterior left upper lobe pulmonary arterial branch and with presumably extrinsic narrowing of the left lower lobe pulmonary artery. Lower paratracheal node short axis diameter 0.7 cm on image 22 series 2, formerly 0.9 cm.  Lungs/Pleura: Pleural metastatic disease noted on the left with several notable pleural nodules. Along the left heart border, a pleural mass measures 1.3 cm in thickness, previously 1.4 cm. Trace left pleural effusion. Scarring and volume loss in  the left lung similar to prior. Small peripheral nodules in the left lower lobe appear mildly reduced in size, for example a prior 5 mm nodule currently measures 4 mm in diameter.  Musculoskeletal: Unremarkable  CT ABDOMEN PELVIS FINDINGS  Hepatobiliary: 1.3 cm enhancing lesion in segment 2 of the liver, image 43 series 2.  Pancreas: Unremarkable  Spleen: Unremarkable  Adrenals/Urinary Tract: Left adrenal mass 2.2 by 1.3 cm on image 55 series 2, formerly 3.2 by 2.7 cm.  Stomach/Bowel: Unremarkable  Vascular/Lymphatic: Aortoiliac atherosclerotic vascular disease.  Reproductive: Uterus absent. 1.9 by 1.3 cm dense lesion along the anterior margin of the vagina, essentially stable, not hypermetabolic on prior PET-CT.  Other: No supplemental non-categorized findings.  Musculoskeletal: Mild  lower lumbar spondylosis and degenerative disc disease.  IMPRESSION: 1. Improved dominant left hilar mass and approved adenopathy and pleural metastatic disease in the chest. The left adrenal mass is also significantly improved. 2. 1.3 cm enhancing lesion in segment 2 of the liver, no change, not previously hypermetabolic, possible hemangioma. 3. Dense lesion along the anterior vaginal margin, query Skene duct cyst or Gartner duct cyst, not previously hypermetabolic.   Electronically Signed   By: Van Clines M.D.   On: 01/01/2015 12:34    ASSESSMENT AND PLAN: This is a very pleasant 59 years old white female recently diagnosed with extensive stage small cell lung cancer status post whole brain irradiation in addition to 6 cycles of systemic chemotherapy with carboplatin and etoposide with significant improvement in her disease but unfortunately a few months later she started having evidence for disease progression. The patient completed second line chemotherapy with cisplatin and irinotecan status post 4 cycles. This was discontinued secondary to disease progression. The patient is currently on third line chemotherapy with  oral Temodar status post 2 cycles. She is tolerating the treatment well except for mild nausea. The recent CT scan of the chest, abdomen and pelvis as well as MRI of the brain showed evidence for regression of her disease after starting treatment with Temodar. I discussed the scan results with the patient and her family. I recommended for her to continue her current treatment with Temodar with the same dose. She would come back for follow-up visit in one month's for reevaluation and repeat blood work. For nausea, the patient will continue on Zofran 30 minutes before her chemotherapy and every 8 hour on as-needed basis. She was advised to call immediately if she has any concerning symptoms in the interval. The patient voices understanding of current disease status and treatment options and is in agreement with the current care plan.  All questions were answered. The patient knows to call the clinic with any problems, questions or concerns. We can certainly see the patient much sooner if necessary.  Disclaimer: This note was dictated with voice recognition software. Similar sounding words can inadvertently be transcribed and may not be corrected upon review.

## 2015-01-05 NOTE — Progress Notes (Signed)
I placed dr form on desk of nurse for dr. Julien Nordmann

## 2015-01-07 LAB — URINE CULTURE

## 2015-01-08 ENCOUNTER — Other Ambulatory Visit: Payer: Self-pay | Admitting: *Deleted

## 2015-01-08 ENCOUNTER — Telehealth: Payer: Self-pay | Admitting: *Deleted

## 2015-01-08 DIAGNOSIS — C3412 Malignant neoplasm of upper lobe, left bronchus or lung: Secondary | ICD-10-CM

## 2015-01-08 MED ORDER — CIPRO 500 MG PO TABS: 500.0000 mg | ORAL_TABLET | Freq: Two times a day (BID) | ORAL | Status: DC

## 2015-01-08 NOTE — Telephone Encounter (Signed)
TC from daughter, [name]. She states she had called earlier about her mother and reiterated that her mother has been more confused, restless and with increased weakness.  Had urine culture done and is positive for UTI. Pt has low ANC of 1.7 as of 01/05/15.  Discussed with Dr. Julien Nordmann and received order for Cipro 500 mg 1 tablet  BID for 5 days. This was escribed to her pharmacy.  Tc made to other daughter, Mendel Ryder. Informed of the above. Instructed her to take her mother to ED if condition worsens and to call us with any further problems/questions/concerns

## 2015-01-08 NOTE — Telephone Encounter (Signed)
PT. IS HAVING MENTAL STATUS CHANGES. HER CONFUSION IS GETTING WORSE. THIS HAPPENED THE LAST TIME THE PT. HAD A URINARY TRACT INFECTION.

## 2015-01-09 ENCOUNTER — Emergency Department (HOSPITAL_COMMUNITY): Payer: 59

## 2015-01-09 ENCOUNTER — Inpatient Hospital Stay (HOSPITAL_COMMUNITY)
Admission: EM | Admit: 2015-01-09 | Discharge: 2015-01-13 | DRG: 689 | Disposition: A | Discharge status: Skilled Nursing Facility | Payer: 59 | Attending: Internal Medicine | Admitting: Internal Medicine

## 2015-01-09 ENCOUNTER — Encounter (HOSPITAL_COMMUNITY): Payer: Self-pay | Admitting: Emergency Medicine

## 2015-01-09 DIAGNOSIS — R569 Unspecified convulsions: Secondary | ICD-10-CM

## 2015-01-09 DIAGNOSIS — R531 Weakness: Secondary | ICD-10-CM

## 2015-01-09 DIAGNOSIS — D696 Thrombocytopenia, unspecified: Secondary | ICD-10-CM | POA: Diagnosis present

## 2015-01-09 DIAGNOSIS — R410 Disorientation, unspecified: Secondary | ICD-10-CM | POA: Diagnosis present

## 2015-01-09 DIAGNOSIS — Z66 Do not resuscitate: Secondary | ICD-10-CM | POA: Diagnosis present

## 2015-01-09 DIAGNOSIS — E039 Hypothyroidism, unspecified: Secondary | ICD-10-CM | POA: Diagnosis present

## 2015-01-09 DIAGNOSIS — D638 Anemia in other chronic diseases classified elsewhere: Secondary | ICD-10-CM | POA: Diagnosis present

## 2015-01-09 DIAGNOSIS — E162 Hypoglycemia, unspecified: Secondary | ICD-10-CM

## 2015-01-09 DIAGNOSIS — C799 Secondary malignant neoplasm of unspecified site: Secondary | ICD-10-CM

## 2015-01-09 DIAGNOSIS — E871 Hypo-osmolality and hyponatremia: Secondary | ICD-10-CM | POA: Diagnosis present

## 2015-01-09 DIAGNOSIS — G9341 Metabolic encephalopathy: Secondary | ICD-10-CM | POA: Diagnosis present

## 2015-01-09 DIAGNOSIS — D72819 Decreased white blood cell count, unspecified: Secondary | ICD-10-CM | POA: Diagnosis present

## 2015-01-09 DIAGNOSIS — Z923 Personal history of irradiation: Secondary | ICD-10-CM

## 2015-01-09 DIAGNOSIS — Z888 Allergy status to other drugs, medicaments and biological substances status: Secondary | ICD-10-CM

## 2015-01-09 DIAGNOSIS — C3482 Malignant neoplasm of overlapping sites of left bronchus and lung: Secondary | ICD-10-CM

## 2015-01-09 DIAGNOSIS — W19XXXA Unspecified fall, initial encounter: Secondary | ICD-10-CM | POA: Diagnosis present

## 2015-01-09 DIAGNOSIS — C7931 Secondary malignant neoplasm of brain: Secondary | ICD-10-CM | POA: Diagnosis present

## 2015-01-09 DIAGNOSIS — N390 Urinary tract infection, site not specified: Secondary | ICD-10-CM | POA: Diagnosis not present

## 2015-01-09 DIAGNOSIS — Z90710 Acquired absence of both cervix and uterus: Secondary | ICD-10-CM

## 2015-01-09 DIAGNOSIS — Z86711 Personal history of pulmonary embolism: Secondary | ICD-10-CM

## 2015-01-09 DIAGNOSIS — N179 Acute kidney failure, unspecified: Secondary | ICD-10-CM | POA: Diagnosis present

## 2015-01-09 DIAGNOSIS — C3490 Malignant neoplasm of unspecified part of unspecified bronchus or lung: Secondary | ICD-10-CM | POA: Diagnosis present

## 2015-01-09 DIAGNOSIS — E11649 Type 2 diabetes mellitus with hypoglycemia without coma: Secondary | ICD-10-CM | POA: Diagnosis present

## 2015-01-09 DIAGNOSIS — B965 Pseudomonas (aeruginosa) (mallei) (pseudomallei) as the cause of diseases classified elsewhere: Secondary | ICD-10-CM | POA: Diagnosis present

## 2015-01-09 DIAGNOSIS — N3000 Acute cystitis without hematuria: Secondary | ICD-10-CM

## 2015-01-09 DIAGNOSIS — J910 Malignant pleural effusion: Secondary | ICD-10-CM | POA: Diagnosis present

## 2015-01-09 DIAGNOSIS — R296 Repeated falls: Secondary | ICD-10-CM | POA: Diagnosis present

## 2015-01-09 DIAGNOSIS — R29898 Other symptoms and signs involving the musculoskeletal system: Secondary | ICD-10-CM

## 2015-01-09 DIAGNOSIS — Z87891 Personal history of nicotine dependence: Secondary | ICD-10-CM

## 2015-01-09 LAB — I-STAT CG4 LACTIC ACID, ED: Lactic Acid, Venous: 0.94 mmol/L (ref 0.5–2.0)

## 2015-01-09 LAB — URINALYSIS, ROUTINE W REFLEX MICROSCOPIC
BILIRUBIN URINE: NEGATIVE
Hgb urine dipstick: NEGATIVE
KETONES UR: NEGATIVE mg/dL
Leukocytes, UA: NEGATIVE
NITRITE: NEGATIVE
Protein, ur: NEGATIVE mg/dL
Specific Gravity, Urine: 1.014 (ref 1.005–1.030)
UROBILINOGEN UA: 0.2 mg/dL (ref 0.0–1.0)
pH: 5 (ref 5.0–8.0)

## 2015-01-09 LAB — CBC WITH DIFFERENTIAL/PLATELET
Basophils Absolute: 0 10*3/uL (ref 0.0–0.1)
Basophils Relative: 0 % (ref 0–1)
EOS ABS: 0 10*3/uL (ref 0.0–0.7)
EOS PCT: 0 % (ref 0–5)
HEMATOCRIT: 32.6 % — AB (ref 36.0–46.0)
Hemoglobin: 11.1 g/dL — ABNORMAL LOW (ref 12.0–15.0)
LYMPHS ABS: 0.5 10*3/uL — AB (ref 0.7–4.0)
Lymphocytes Relative: 11 % — ABNORMAL LOW (ref 12–46)
MCH: 32.6 pg (ref 26.0–34.0)
MCHC: 34 g/dL (ref 30.0–36.0)
MCV: 95.6 fL (ref 78.0–100.0)
MONO ABS: 0.4 10*3/uL (ref 0.1–1.0)
Monocytes Relative: 9 % (ref 3–12)
NEUTROS ABS: 3.3 10*3/uL (ref 1.7–7.7)
NEUTROS PCT: 80 % — AB (ref 43–77)
Platelets: 133 10*3/uL — ABNORMAL LOW (ref 150–400)
RBC: 3.41 MIL/uL — ABNORMAL LOW (ref 3.87–5.11)
RDW: 13.3 % (ref 11.5–15.5)
WBC: 4.1 10*3/uL (ref 4.0–10.5)

## 2015-01-09 LAB — URINE MICROSCOPIC-ADD ON

## 2015-01-09 LAB — I-STAT CHEM 8, ED
BUN: 23 mg/dL — ABNORMAL HIGH (ref 6–20)
CHLORIDE: 104 mmol/L (ref 101–111)
Calcium, Ion: 1.32 mmol/L — ABNORMAL HIGH (ref 1.12–1.23)
Creatinine, Ser: 1 mg/dL (ref 0.44–1.00)
Glucose, Bld: 28 mg/dL — CL (ref 65–99)
HEMATOCRIT: 34 % — AB (ref 36.0–46.0)
Hemoglobin: 11.6 g/dL — ABNORMAL LOW (ref 12.0–15.0)
POTASSIUM: 3.8 mmol/L (ref 3.5–5.1)
Sodium: 139 mmol/L (ref 135–145)
TCO2: 24 mmol/L (ref 0–100)

## 2015-01-09 LAB — COMPREHENSIVE METABOLIC PANEL
ALBUMIN: 4.4 g/dL (ref 3.5–5.0)
ALK PHOS: 35 U/L — AB (ref 38–126)
ALT: 17 U/L (ref 14–54)
ANION GAP: 11 (ref 5–15)
AST: 18 U/L (ref 15–41)
BUN: 24 mg/dL — ABNORMAL HIGH (ref 6–20)
CALCIUM: 9.9 mg/dL (ref 8.9–10.3)
CHLORIDE: 102 mmol/L (ref 101–111)
CO2: 25 mmol/L (ref 22–32)
Creatinine, Ser: 1.1 mg/dL — ABNORMAL HIGH (ref 0.44–1.00)
GFR calc non Af Amer: 55 mL/min — ABNORMAL LOW (ref >60)
Glucose, Bld: 28 mg/dL — CL (ref 65–99)
POTASSIUM: 3.7 mmol/L (ref 3.5–5.1)
SODIUM: 138 mmol/L (ref 135–145)
Total Bilirubin: 0.5 mg/dL (ref 0.3–1.2)
Total Protein: 6.9 g/dL (ref 6.5–8.1)

## 2015-01-09 LAB — GLUCOSE, CAPILLARY
Glucose-Capillary: 45 mg/dL — ABNORMAL LOW (ref 65–99)
Glucose-Capillary: 59 mg/dL — ABNORMAL LOW (ref 65–99)
Glucose-Capillary: 89 mg/dL (ref 65–99)
Glucose-Capillary: 95 mg/dL (ref 65–99)
Glucose-Capillary: 68 mg/dL (ref 65–99)
Glucose-Capillary: 78 mg/dL (ref 65–99)
Glucose-Capillary: 94 mg/dL (ref 65–99)

## 2015-01-09 LAB — TSH: TSH: 0.088 u[IU]/mL — ABNORMAL LOW (ref 0.350–4.500)

## 2015-01-09 LAB — CBG MONITORING, ED
GLUCOSE-CAPILLARY: 42 mg/dL — AB (ref 65–99)
Glucose-Capillary: 95 mg/dL (ref 65–99)

## 2015-01-09 MED ORDER — LORAZEPAM 2 MG/ML IJ SOLN
1.0000 mg | Freq: Once | INTRAMUSCULAR | Status: AC
  Administered 2015-01-09: 1 mg via INTRAVENOUS
  Filled 2015-01-09: qty 1

## 2015-01-09 MED ORDER — SODIUM CHLORIDE 0.9 % IV BOLUS (SEPSIS)
1000.0000 mL | Freq: Once | INTRAVENOUS | Status: AC
  Administered 2015-01-09: 1000 mL via INTRAVENOUS

## 2015-01-09 MED ORDER — DEXTROSE 50 % IV SOLN
INTRAVENOUS | Status: AC
  Administered 2015-01-09: 50 mL via INTRAVENOUS
  Filled 2015-01-09: qty 50

## 2015-01-09 MED ORDER — ONDANSETRON HCL 4 MG/2ML IJ SOLN: 4.0000 mg | Freq: Four times a day (QID) | INTRAMUSCULAR | Status: DC | PRN

## 2015-01-09 MED ORDER — TEMOZOLOMIDE 100 MG PO CAPS
360.0000 mg | ORAL_CAPSULE | Freq: Once | ORAL | Status: DC
  Filled 2015-01-09: qty 3

## 2015-01-09 MED ORDER — DEXTROSE 50 % IV SOLN
1.0000 | Freq: Once | INTRAVENOUS | Status: AC
  Administered 2015-01-09: 50 mL via INTRAVENOUS

## 2015-01-09 MED ORDER — ALBUTEROL SULFATE (2.5 MG/3ML) 0.083% IN NEBU: 2.5000 mg | INHALATION_SOLUTION | Freq: Four times a day (QID) | RESPIRATORY_TRACT | Status: DC | PRN

## 2015-01-09 MED ORDER — DEXTROSE 5 % IV SOLN: Freq: Once | INTRAVENOUS | Status: DC

## 2015-01-09 MED ORDER — ESCITALOPRAM OXALATE 10 MG PO TABS
10.0000 mg | ORAL_TABLET | Freq: Every day | ORAL | Status: DC
  Administered 2015-01-10 – 2015-01-12 (×3): 10 mg via ORAL
  Filled 2015-01-09 (×3): qty 1

## 2015-01-09 MED ORDER — CEFTRIAXONE SODIUM IN DEXTROSE 20 MG/ML IV SOLN
1.0000 g | INTRAVENOUS | Status: DC
  Administered 2015-01-09 – 2015-01-11 (×3): 1 g via INTRAVENOUS
  Filled 2015-01-09 (×4): qty 50

## 2015-01-09 MED ORDER — ONDANSETRON HCL 4 MG PO TABS: 4.0000 mg | ORAL_TABLET | Freq: Four times a day (QID) | ORAL | Status: DC | PRN

## 2015-01-09 MED ORDER — DEXTROSE 50 % IV SOLN
INTRAVENOUS | Status: AC
  Administered 2015-01-09: 25 mL
  Filled 2015-01-09: qty 50

## 2015-01-09 MED ORDER — DEXTROSE 50 % IV SOLN
25.0000 mL | Freq: Once | INTRAVENOUS | Status: AC
  Administered 2015-01-09: 25 mL via INTRAVENOUS

## 2015-01-09 MED ORDER — METHYLPREDNISOLONE SODIUM SUCC 40 MG IJ SOLR
5.0000 mg | Freq: Four times a day (QID) | INTRAMUSCULAR | Status: DC
  Administered 2015-01-09 – 2015-01-13 (×15): 5.2 mg via INTRAVENOUS
  Filled 2015-01-09 (×15): qty 1

## 2015-01-09 MED ORDER — DEXTROSE 10 % IV SOLN: INTRAVENOUS | Status: DC

## 2015-01-09 MED ORDER — CIPROFLOXACIN IN D5W 400 MG/200ML IV SOLN: 400.0000 mg | Freq: Two times a day (BID) | INTRAVENOUS | Status: DC

## 2015-01-09 MED ORDER — LORAZEPAM 2 MG/ML IJ SOLN
0.5000 mg | Freq: Every evening | INTRAMUSCULAR | Status: DC | PRN
  Administered 2015-01-09: 0.5 mg via INTRAVENOUS
  Filled 2015-01-09: qty 1

## 2015-01-09 MED ORDER — DEXTROSE-NACL 5-0.45 % IV SOLN
INTRAVENOUS | Status: DC
  Administered 2015-01-09: 10:00:00 via INTRAVENOUS

## 2015-01-09 MED ORDER — ALBUTEROL SULFATE HFA 108 (90 BASE) MCG/ACT IN AERS: 2.0000 | INHALATION_SPRAY | Freq: Four times a day (QID) | RESPIRATORY_TRACT | Status: DC | PRN

## 2015-01-09 MED ORDER — TEMOZOLOMIDE 20 MG PO CAPS: 200.0000 mg/m2/d | ORAL_CAPSULE | Freq: Every day | ORAL | Status: DC

## 2015-01-09 MED ORDER — DEXTROSE 10 % IV SOLN
INTRAVENOUS | Status: DC
  Administered 2015-01-09 – 2015-01-12 (×6): via INTRAVENOUS
  Filled 2015-01-09 (×14): qty 1000

## 2015-01-09 MED ORDER — LORAZEPAM 2 MG/ML IJ SOLN: 2.0000 mg | Freq: Once | INTRAMUSCULAR | Status: DC

## 2015-01-09 MED ORDER — LEVOTHYROXINE SODIUM 25 MCG PO TABS: 150.0000 ug | ORAL_TABLET | Freq: Every day | ORAL | Status: DC

## 2015-01-09 NOTE — Progress Notes (Signed)
Hypoglycemic Event  CBG:45  Treatment: D50 IV 25 mL  Symptoms: Nervous/irritable  Follow-up CBG: Time:1430 CBG Result:89  Possible Reasons for Event: Unknown  Comments/MD notified:Dr. Theodoro Clock, Larence Penning  Remember to initiate Hypoglycemia Order Set & complete

## 2015-01-09 NOTE — ED Notes (Signed)
Dr Alvino Chapel made aware of abnormal Glucose

## 2015-01-09 NOTE — ED Notes (Signed)
Pt fell on Tuesday, hit hip/back/head. Daughter states pt was baseline Tuesday, Wednesday began having worsening hallucinations than per her normal, yesterday pt increasingly confused and has now fallen three times since yesterday. CBC and UA performed yesterday at PCP office, found to have a UTI, began taking oral cipro yesterday x 2 doses.

## 2015-01-09 NOTE — ED Notes (Signed)
[name] - Daughter [phone]

## 2015-01-09 NOTE — ED Notes (Signed)
Patient transported to CT 

## 2015-01-09 NOTE — Progress Notes (Signed)
Hypoglycemic Event  CBG:59  Treatment: D50 IV 25 mL  Symptoms: Nervous/irritable  Follow-up CBG: Time:95 CBG Result:5:35  Possible Reasons for Event: Unknown  Comments/MD notified:Dr. Theodoro Clock, Larence Penning  Remember to initiate Hypoglycemia Order Set & complete

## 2015-01-09 NOTE — Progress Notes (Signed)
Hypoglycemic Event  CBG:68  Treatment: D50 IV 25 mL  Symptoms: Nervous/irritable  Follow-up CBG: Time:1900 CBG Result:110  Possible Reasons for Event: Unknown  Comments/MD notified:Dr. Theodoro Clock, Larence Penning  Remember to initiate Hypoglycemia Order Set & complete

## 2015-01-09 NOTE — Progress Notes (Signed)
Patient alert but restless in bed. No pain noted. Alert to self only. Agree with previous nurse's assessment, no new changes noted. Will continue to assess patient.

## 2015-01-09 NOTE — ED Provider Notes (Signed)
CSN: [id number]     Arrival date & time 01/09/15  0705 History   First MD Initiated Contact with Patient 01/09/15 [phone]     Chief Complaint  Patient presents with  . Altered Mental Status  . Fall    Level V caveat due to altered mental status (Consider location/radiation/quality/duration/timing/severity/associated sxs/prior Treatment) Patient is a 59 y.o. female presenting with altered mental status and fall. The history is provided by the patient, a relative and the spouse.  Altered Mental Status Fall   patient with confusion and altered mental status. 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 On oral chemotherapy now. Today is day 5 over 5 date course. She's been more confused for the last 4 days. She had a visit to her oncologist on Monday and a urine culture was sent then which showed Klebsiella. Family states are not called on the results but yesterday they called the office because the patient was more confused and she was started on Cipro. She has had 2 doses. She's continued to be more confused and more week. She has had 3 falls also. She has been hallucinating more than her baseline. She has had a decreased appetite. No fevers. No cough.  Past Medical History  Diagnosis Date  . Pulmonary embolism   . Hypothyroid   . Radiation 12/18/13-12/31/13    whole brain 30 gray, left central chest 30 gray  . Diabetes     metformin  . 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 Brain cancer    Past Surgical History  Procedure Laterality Date  . Abdominal hysterectomy    . Spine surgery    . Cervical fusion    . Video bronchoscopy with endobronchial ultrasound N/A 12/13/2013    Procedure: VIDEO BRONCHOSCOPY WITH ENDOBRONCHIAL ULTRASOUND;  Surgeon: Grace Isaac, MD;  Location: [place];  Service: Thoracic;  Laterality: N/A;  . Portacath placement Left 12/13/2013    Procedure: INSERTION PORT-A-CATH;  Surgeon: Grace Isaac, MD;   Location: [facility];  Service: Thoracic;  Laterality: Left;   Family History  Problem Relation Age of Onset  . Colon cancer Mother   . Liver cancer Father    History  Substance Use Topics  . Smoking status: Former Smoker -- 0.50 packs/day for 25 years    Types: Cigarettes    Quit date: 04/13/2013  . Smokeless tobacco: Never Used  . Alcohol Use: No   OB History    No data available     Review of Systems  Unable to perform ROS     Allergies  Actos and Vicodin  Home Medications   Prior to Admission medications   Medication Sig Start Date End Date Taking? Authorizing Provider  albuterol (PROVENTIL HFA;VENTOLIN HFA) 108 (90 BASE) MCG/ACT inhaler Inhale 2 puffs into the lungs every 6 (six) hours as needed for wheezing or shortness of breath. 12/20/13  Yes Costin Karlyne Greenspan, MD  Canagliflozin (INVOKANA) 300 MG TABS Take 1 tablet by mouth daily before breakfast.   Yes Historical Provider, MD  CIPRO 500 MG tablet Take 1 tablet (500 mg total) by mouth 2 (two) times daily. 01/08/15  Yes Curt Bears, MD  escitalopram (LEXAPRO) 10 MG tablet Take 10 mg by mouth at bedtime.    Yes Historical Provider, MD  fenofibrate 160 MG tablet Take 160 mg by mouth at bedtime.    Yes Historical Provider, MD  glimepiride (AMARYL) 4 MG tablet  Take 4 mg by mouth daily with breakfast.   Yes Historical Provider, MD  Glucose Blood (FREESTYLE LITE TEST VI) 1 each as directed.  11/10/14  Yes Historical Provider, MD  levothyroxine (SYNTHROID, LEVOTHROID) 150 MCG tablet Take 150 mcg by mouth daily before breakfast.   Yes Historical Provider, MD  LORazepam (ATIVAN) 1 MG tablet Take 0.5-1 mg by mouth 2 (two) times daily as needed for anxiety.  03/12/14  Yes Historical Provider, MD  losartan (COZAAR) 50 MG tablet Take 50 mg by mouth at bedtime.    Yes Historical Provider, MD  naproxen sodium (ANAPROX) 220 MG tablet Take 440 mg by mouth daily as needed (pain).   Yes Historical Provider, MD  ondansetron (ZOFRAN) 8 MG  tablet Take 8 mg by mouth every 8 (eight) hours as needed for nausea or vomiting.  05/01/14  Yes Historical Provider, MD  oxyCODONE-acetaminophen (PERCOCET) 5-325 MG per tablet Take 1-2 tablets by mouth every 4 (four) hours as needed. Patient taking differently: Take 1-2 tablets by mouth every 4 (four) hours as needed for moderate pain or severe pain.  10/02/14  Yes Carlton Adam, PA-C  sitaGLIPtin-metformin (JANUMET) 50-1000 MG per tablet Take 1 tablet by mouth 2 (two) times daily with a meal.   Yes Historical Provider, MD  sulfamethoxazole-trimethoprim (BACTRIM DS,SEPTRA DS) 800-160 MG per tablet One tablets by mouth twice a day on Mondays and Thursdays weekly. Patient taking differently: Take 1 tablet by mouth 2 (two) times a week. One tablet by mouth twice a day on Mondays and Thursdays weekly. 10/25/14  Yes Curt Bears, MD  temozolomide (TEMODAR) 180 MG capsule Take 2 capsules (360 mg total) by mouth daily. May take on an empty stomach or at bedtime to decrease nausea & vomiting. 10/25/14  Yes Curt Bears, MD  traZODone (DESYREL) 25 mg TABS tablet Take 25 mg by mouth at bedtime.   Yes Knox Royalty, NP  vitamin B-12 (CYANOCOBALAMIN) 100 MCG tablet Take 100 mcg by mouth daily.   Yes Historical Provider, MD   BP 121/90 mmHg  Pulse 101  Temp(Src) 98.6 F (37 C) (Axillary)  Resp 20  Ht '5\' 6"'$  (1.676 m)  Wt 166 lb (75.297 kg)  BMI 26.81 kg/m2  SpO2 100% Physical Exam  Constitutional: She appears well-developed.  Patient has lost her hair.  HENT:  Head: Atraumatic.  Cardiovascular: Normal rate and regular rhythm.   Pulmonary/Chest: Effort normal.  Abdominal: Soft.  Musculoskeletal:  Lumbar tenderness, chronic per patient and per family. No tenderness to either hip or knee. No upper extremity tenderness.  Neurological: She is alert.  Somewhat slow to answer. Able to and recognize her family but not a great historian. Moving all extremities.    ED Course  Procedures (including  critical care time) Labs Review Labs Reviewed  COMPREHENSIVE METABOLIC PANEL - Abnormal; Notable for the following:    Glucose, Bld 28 (*)    BUN 24 (*)    Creatinine, Ser 1.10 (*)    Alkaline Phosphatase 35 (*)    GFR calc non Af Amer 55 (*)    All other components within normal limits  URINALYSIS, ROUTINE W REFLEX MICROSCOPIC - Abnormal; Notable for the following:    Glucose, UA >1000 (*)    All other components within normal limits  CBC WITH DIFFERENTIAL/PLATELET - Abnormal; Notable for the following:    RBC 3.41 (*)    Hemoglobin 11.1 (*)    HCT 32.6 (*)    Platelets 133 (*)  Neutrophils Relative % 80 (*)    Lymphocytes Relative 11 (*)    Lymphs Abs 0.5 (*)    All other components within normal limits  URINE MICROSCOPIC-ADD ON - Abnormal; Notable for the following:    Bacteria, UA MANY (*)    Casts HYALINE CASTS (*)    All other components within normal limits  I-STAT CHEM 8, ED - Abnormal; Notable for the following:    BUN 23 (*)    Glucose, Bld 28 (*)    Calcium, Ion 1.32 (*)    Hemoglobin 11.6 (*)    HCT 34.0 (*)    All other components within normal limits  CBG MONITORING, ED - Abnormal; Notable for the following:    Glucose-Capillary 42 (*)    All other components within normal limits  URINE CULTURE  I-STAT CG4 LACTIC ACID, ED  CBG MONITORING, ED    Imaging Review Dg Chest 2 View  01/09/2015   CLINICAL DATA:  Recent falls. Altered mental status. Lung cancer with progressive confusion and weakness.  EXAM: CHEST - 2 VIEW  COMPARISON:  CT chest 01/01/2015  FINDINGS: The heart size is normal. A left subclavian Port-A-Cath is stable in position. A left pleural effusion persists. There is some volume loss on the left. A left basilar density likely reflects the known pleural-based metastases focal nodularity along the cardiac border also represents metastases. Scarring or tumor is again noted in the left upper lobe. The right lung is clear.  IMPRESSION: 1. Persistent  left pleural effusion, likely malignant. 2. Nodular disease at the left base and along the cardiac border compatible with known metastases. 3. Scarring or tumor in the left upper lobe is stable. 4. No acute abnormality or significant interval change.   Electronically Signed   By: San Morelle M.D.   On: 01/09/2015 08:03   Ct Head Wo Contrast  01/09/2015   CLINICAL DATA:  Patient fell 1 day prior. Lung carcinoma with history of radiation therapy to the brain.  EXAM: CT HEAD WITHOUT CONTRAST  TECHNIQUE: Contiguous axial images were obtained from the base of the skull through the vertex without intravenous contrast.  COMPARISON:  Head CT August 12, 2014 and brain MRI December 23, 2014  FINDINGS: The ventricles are normal in size and configuration. There is currently no demonstrable mass or edema. No hemorrhage, extra-axial fluid collection, or midline shift is appreciable. A known varix in the inferior right occipital lobe seen on MR is not appreciable on this noncontrast enhanced study. There is widespread decreased attenuation throughout the centra semiovale bilaterally which may be in large part due to previous radiation therapy but may also be in part due to small vessel disease. No acute appearing infarct is seen on this study. The bony calvarium appears intact. There is opacification of multiple mastoid air cells bilaterally, stable compared to recent MR.  IMPRESSION: Extensive periventricular decreased attenuation, probably enlarged part due to post radiation therapy change. There may be underlying small vessel disease. On this noncontrast enhanced study, there is no mass or edema. No hemorrhage. No acute appearing infarct. No extra-axial fluid collections. There is bilateral mastoid air cell disease which may in part be due to radiation therapy change.   Electronically Signed   By: Lowella Grip III M.D.   On: 01/09/2015 08:19     EKG Interpretation None      MDM   Final diagnoses:   Acute cystitis without hematuria  Metastatic cancer  Hypoglycemia    Patient with  decreased mental status. Currently being treated for urinary tract infection and has known metastatic cancer. Had decreased her mental status while in the ER and found to be hypoglycemic. Had bolus of D 50 and started on D5 drip. Have return of the hypoglycemia and required Ativan also. Will admit to internal medicine. Patient is a DO NOT RESUSCITATE. Required repeated boluses of D50 and D5 and then D10 drip.  CRITICAL CARE Performed by: Mackie Pai Total critical care time: 30 Critical care time was exclusive of separately billable procedures and treating other patients. Critical care was necessary to treat or prevent imminent or life-threatening deterioration. Critical care was time spent personally by me on the following activities: development of treatment plan with patient and/or surrogate as well as nursing, discussions with consultants, evaluation of patient's response to treatment, examination of patient, obtaining history from patient or surrogate, ordering and performing treatments and interventions, ordering and review of laboratory studies, ordering and review of radiographic studies, pulse oximetry and re-evaluation of patient's condition.     Davonna Belling, MD 01/09/15 [phone]

## 2015-01-09 NOTE — H&P (Signed)
History and Physical  Carol Bond:[id number] DOB: 1956-01-22 DOA: 01/09/2015  Referring physician: Delphina Cahill, ER physician PCP: [name], MD   Chief Complaint: Confusion  HPI: Carol Bond is a 59 y.o. female  With past medical history of small cell carcinoma with brain metastases and diabetes who has some mild underlying confusion and is cared for at home by her husband and 2 daughters. Patient this past week has been having increased confusion, frequent falls and has had similar presentations in the past when she developed a urinary tract infection. Urinalysis done by oncology in the office noted positive for UTI and patient called in Cipro which was started on 5/12. Today, patient more confused and then reportedly had generalized seizure witnessed by family which lasted only a minute or so. EMS called and patient brought in the hospital. EMS found patient to be hypoglycemic and she was given amp of D50. Labs done in the emergency room were unrevealing as was a CT scan of the head. Patient became agitated at times requiring several doses of Ativan to help keep her calm. Initially CBGs would improve after amps of D50 given, but then she would go down and become hypoglycemic again. Other lab work unrevealing including normal lactic acid level. Hospitalist call for further evaluation   Review of Systems:  Patient seen after arrival to floor. Pt is extremely somnolent and lethargic following multiple doses of Ativan given in emergency room. I'm unable to get review of systems from her.   Past Medical History  Diagnosis Date  . Pulmonary embolism   . Hypothyroid   . Radiation 12/18/13-12/31/13    whole brain 30 gray, left central chest 30 gray  . Diabetes     metformin  . 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 Brain cancer    Past Surgical History  Procedure Laterality Date  . Abdominal hysterectomy    . Spine surgery    .  Cervical fusion    . Video bronchoscopy with endobronchial ultrasound N/A 12/13/2013    Procedure: VIDEO BRONCHOSCOPY WITH ENDOBRONCHIAL ULTRASOUND;  Surgeon: Grace Isaac, MD;  Location: [place];  Service: Thoracic;  Laterality: N/A;  . Portacath placement Left 12/13/2013    Procedure: INSERTION PORT-A-CATH;  Surgeon: Grace Isaac, MD;  Location: [employer];  Service: Thoracic;  Laterality: Left;   Social History:  reports that she quit smoking about 20 months ago. Her smoking use included Cigarettes. She has a 12.5 pack-year smoking history. She has never used smokeless tobacco. She reports that she does not drink alcohol or use illicit drugs. Patient lives at home with her husband and 2 daughters & is able to participate in activities of daily living with maximum assistance  Allergies  Allergen Reactions  . Actos [Pioglitazone] Swelling and Other (See Comments)    Low blood sugar and swelling all over body no throat or tongue   . Vicodin [Hydrocodone-Acetaminophen] Itching    Family History  Problem Relation Age of Onset  . Colon cancer Mother   . Liver cancer Father       Prior to Admission medications   Medication Sig Start Date End Date Taking? Authorizing Provider  albuterol (PROVENTIL HFA;VENTOLIN HFA) 108 (90 BASE) MCG/ACT inhaler Inhale 2 puffs into the lungs every 6 (six) hours as needed for wheezing or shortness of breath. 12/20/13  Yes Costin Karlyne Greenspan, MD  Canagliflozin (INVOKANA) 300 MG TABS Take 1 tablet  by mouth daily before breakfast.   Yes Historical Provider, MD  CIPRO 500 MG tablet Take 1 tablet (500 mg total) by mouth 2 (two) times daily. 01/08/15  Yes Curt Bears, MD  escitalopram (LEXAPRO) 10 MG tablet Take 10 mg by mouth at bedtime.    Yes Historical Provider, MD  fenofibrate 160 MG tablet Take 160 mg by mouth at bedtime.    Yes Historical Provider, MD  glimepiride (AMARYL) 4 MG tablet Take 4 mg by mouth daily with breakfast.   Yes Historical Provider, MD    Glucose Blood (FREESTYLE LITE TEST VI) 1 each as directed.  11/10/14  Yes Historical Provider, MD  levothyroxine (SYNTHROID, LEVOTHROID) 150 MCG tablet Take 150 mcg by mouth daily before breakfast.   Yes Historical Provider, MD  LORazepam (ATIVAN) 1 MG tablet Take 0.5-1 mg by mouth 2 (two) times daily as needed for anxiety.  03/12/14  Yes Historical Provider, MD  losartan (COZAAR) 50 MG tablet Take 50 mg by mouth at bedtime.    Yes Historical Provider, MD  naproxen sodium (ANAPROX) 220 MG tablet Take 440 mg by mouth daily as needed (pain).   Yes Historical Provider, MD  ondansetron (ZOFRAN) 8 MG tablet Take 8 mg by mouth every 8 (eight) hours as needed for nausea or vomiting.  05/01/14  Yes Historical Provider, MD  oxyCODONE-acetaminophen (PERCOCET) 5-325 MG per tablet Take 1-2 tablets by mouth every 4 (four) hours as needed. Patient taking differently: Take 1-2 tablets by mouth every 4 (four) hours as needed for moderate pain or severe pain.  10/02/14  Yes Carlton Adam, PA-C  sitaGLIPtin-metformin (JANUMET) 50-1000 MG per tablet Take 1 tablet by mouth 2 (two) times daily with a meal.   Yes Historical Provider, MD  sulfamethoxazole-trimethoprim (BACTRIM DS,SEPTRA DS) 800-160 MG per tablet One tablets by mouth twice a day on Mondays and Thursdays weekly. Patient taking differently: Take 1 tablet by mouth 2 (two) times a week. One tablet by mouth twice a day on Mondays and Thursdays weekly. 10/25/14  Yes Curt Bears, MD  temozolomide (TEMODAR) 180 MG capsule Take 2 capsules (360 mg total) by mouth daily. May take on an empty stomach or at bedtime to decrease nausea & vomiting. 10/25/14  Yes Curt Bears, MD  traZODone (DESYREL) 25 mg TABS tablet Take 25 mg by mouth at bedtime.   Yes Knox Royalty, NP  vitamin B-12 (CYANOCOBALAMIN) 100 MCG tablet Take 100 mcg by mouth daily.   Yes Historical Provider, MD    Physical Exam: BP 121/90 mmHg  Pulse 101  Temp(Src) 98.6 F (37 C) (Axillary)  Resp 20   Ht '5\' 6"'$  (1.676 m)  Wt 75.297 kg (166 lb)  BMI 26.81 kg/m2  SpO2 100%  General:  Somnolent, responds to touch Eyes: Eyes mostly closed, sclera appears nonicteric ENT: Normocephalic, atraumatic, mucous numbers are dry Neck: No JVD Cardiovascular: Regular rate and rhythm, S1-S2 Respiratory: Clear to auscultation bilaterally Abdomen: Soft, nontender, nondistended, hypoactive bowel sounds Skin: No skin breaks, tears or lesions Musculoskeletal: No clubbing or cyanosis or edema Psychiatric: Currently sedated, unable to assess Neurologic: Currently sedated, unable to assess           Labs on Admission:  Basic Metabolic Panel:  Recent Labs Lab 01/05/15 1007 01/05/15 1009 01/09/15 0853 01/09/15 0900  NA  --  141 138 139  K  --  3.9 3.7 3.8  CL  --   --  102 104  CO2  --  22 25  --  GLUCOSE  --  113 28* 28*  BUN  --  16.9 24* 23*  CREATININE  --  0.7 1.10* 1.00  CALCIUM  --  10.0 9.9  --   MG 2.1  --   --   --    Liver Function Tests:  Recent Labs Lab 01/05/15 1009 01/09/15 0853  AST 18 18  ALT 24 17  ALKPHOS 46 35*  BILITOT 0.34 0.5  PROT 6.9 6.9  ALBUMIN 4.2 4.4   No results for input(s): LIPASE, AMYLASE in the last 168 hours. No results for input(s): AMMONIA in the last 168 hours. CBC:  Recent Labs Lab 01/05/15 1009 01/09/15 0853 01/09/15 0900  WBC 2.5* 4.1  --   NEUTROABS 1.7 3.3  --   HGB 12.2 11.1* 11.6*  HCT 36.6 32.6* 34.0*  MCV 97.1 95.6  --   PLT 118* 133*  --    Cardiac Enzymes: No results for input(s): CKTOTAL, CKMB, CKMBINDEX, TROPONINI in the last 168 hours.  BNP (last 3 results) No results for input(s): BNP in the last 8760 hours.  ProBNP (last 3 results)  Recent Labs  07/11/14 0840  PROBNP 40.6    CBG:  Recent Labs Lab 01/09/15 0938 01/09/15 1052 01/09/15 1708 01/09/15 1735  GLUCAP 95 42* 59* 95    Radiological Exams on Admission: Dg Chest 2 View  01/09/2015   CLINICAL DATA:  Recent falls. Altered mental status.  Lung cancer with progressive confusion and weakness.  EXAM: CHEST - 2 VIEW  COMPARISON:  CT chest 01/01/2015  FINDINGS: The heart size is normal. A left subclavian Port-A-Cath is stable in position. A left pleural effusion persists. There is some volume loss on the left. A left basilar density likely reflects the known pleural-based metastases focal nodularity along the cardiac border also represents metastases. Scarring or tumor is again noted in the left upper lobe. The right lung is clear.  IMPRESSION: 1. Persistent left pleural effusion, likely malignant. 2. Nodular disease at the left base and along the cardiac border compatible with known metastases. 3. Scarring or tumor in the left upper lobe is stable. 4. No acute abnormality or significant interval change.   Electronically Signed   By: San Morelle M.D.   On: 01/09/2015 08:03   Ct Head Wo Contrast  01/09/2015   CLINICAL DATA:  Patient fell 1 day prior. Lung carcinoma with history of radiation therapy to the brain.  EXAM: CT HEAD WITHOUT CONTRAST  TECHNIQUE: Contiguous axial images were obtained from the base of the skull through the vertex without intravenous contrast.  COMPARISON:  Head CT August 12, 2014 and brain MRI December 23, 2014  FINDINGS: The ventricles are normal in size and configuration. There is currently no demonstrable mass or edema. No hemorrhage, extra-axial fluid collection, or midline shift is appreciable. A known varix in the inferior right occipital lobe seen on MR is not appreciable on this noncontrast enhanced study. There is widespread decreased attenuation throughout the centra semiovale bilaterally which may be in large part due to previous radiation therapy but may also be in part due to small vessel disease. No acute appearing infarct is seen on this study. The bony calvarium appears intact. There is opacification of multiple mastoid air cells bilaterally, stable compared to recent MR.  IMPRESSION: Extensive  periventricular decreased attenuation, probably enlarged part due to post radiation therapy change. There may be underlying small vessel disease. On this noncontrast enhanced study, there is no mass or edema. No hemorrhage. No  acute appearing infarct. No extra-axial fluid collections. There is bilateral mastoid air cell disease which may in part be due to radiation therapy change.   Electronically Signed   By: Lowella Grip III M.D.   On: 01/09/2015 08:19    EKG: Independently reviewed. Not done  Assessment/Plan Present on Admission:  . UTI (urinary tract infection): Reviewed   urine cultures, pansensitive Escherichia coli. The patient is unable take by mouth, IV Rocephin. . Hypoglycemia: Likely secondary UTI. Have held patient's oral medications. In the meantime, using D10 IV fluids and with persistent hypoglycemia have added very low dose IV Solu-Medrol and hopes to keep her sugars up until infection is better resolved.  . metastatic Small cell lung cancer with brain metastases: Stable for now.  . Acute delirium with underlying chronic confusion due to brain metastases: Likely from hypoglycemia and UTI. Hopefully will improve as infection resolves and sugars improved. Family request social work assistance for nursing home placement given concerns for increased care and they cannot be with home with her all the time.  Seizure like activity: Isolated event. Likely set off by hypoglycemia. At this time, would not put her on antiepileptics. If activity recurs, will start on Keppra and check EEG  Consultants: none  Code Status: DO NOT RESUSCITATE as confirmed by family  Family Communication: husband and children at the bedside   Disposition Plan: home, although family would like assistance with nursing home placement as they're unable to provide 24-hour care  Time spent: 66 minutes  [facility] Pager [phone]

## 2015-01-10 DIAGNOSIS — N179 Acute kidney failure, unspecified: Secondary | ICD-10-CM

## 2015-01-10 DIAGNOSIS — Z87891 Personal history of nicotine dependence: Secondary | ICD-10-CM | POA: Diagnosis not present

## 2015-01-10 DIAGNOSIS — Z923 Personal history of irradiation: Secondary | ICD-10-CM | POA: Diagnosis not present

## 2015-01-10 DIAGNOSIS — R410 Disorientation, unspecified: Secondary | ICD-10-CM | POA: Diagnosis present

## 2015-01-10 DIAGNOSIS — W19XXXA Unspecified fall, initial encounter: Secondary | ICD-10-CM | POA: Diagnosis present

## 2015-01-10 DIAGNOSIS — J910 Malignant pleural effusion: Secondary | ICD-10-CM | POA: Diagnosis present

## 2015-01-10 DIAGNOSIS — C7931 Secondary malignant neoplasm of brain: Secondary | ICD-10-CM | POA: Diagnosis present

## 2015-01-10 DIAGNOSIS — G9340 Encephalopathy, unspecified: Secondary | ICD-10-CM

## 2015-01-10 DIAGNOSIS — Z90710 Acquired absence of both cervix and uterus: Secondary | ICD-10-CM | POA: Diagnosis not present

## 2015-01-10 DIAGNOSIS — E871 Hypo-osmolality and hyponatremia: Secondary | ICD-10-CM | POA: Diagnosis present

## 2015-01-10 DIAGNOSIS — Z86711 Personal history of pulmonary embolism: Secondary | ICD-10-CM | POA: Diagnosis not present

## 2015-01-10 DIAGNOSIS — G9341 Metabolic encephalopathy: Secondary | ICD-10-CM | POA: Diagnosis present

## 2015-01-10 DIAGNOSIS — E11649 Type 2 diabetes mellitus with hypoglycemia without coma: Secondary | ICD-10-CM | POA: Diagnosis present

## 2015-01-10 DIAGNOSIS — B965 Pseudomonas (aeruginosa) (mallei) (pseudomallei) as the cause of diseases classified elsewhere: Secondary | ICD-10-CM | POA: Diagnosis present

## 2015-01-10 DIAGNOSIS — D72819 Decreased white blood cell count, unspecified: Secondary | ICD-10-CM | POA: Diagnosis present

## 2015-01-10 DIAGNOSIS — D696 Thrombocytopenia, unspecified: Secondary | ICD-10-CM | POA: Diagnosis present

## 2015-01-10 DIAGNOSIS — D638 Anemia in other chronic diseases classified elsewhere: Secondary | ICD-10-CM | POA: Diagnosis present

## 2015-01-10 DIAGNOSIS — N390 Urinary tract infection, site not specified: Secondary | ICD-10-CM | POA: Diagnosis present

## 2015-01-10 DIAGNOSIS — C3490 Malignant neoplasm of unspecified part of unspecified bronchus or lung: Secondary | ICD-10-CM | POA: Diagnosis not present

## 2015-01-10 DIAGNOSIS — Z888 Allergy status to other drugs, medicaments and biological substances status: Secondary | ICD-10-CM | POA: Diagnosis not present

## 2015-01-10 DIAGNOSIS — R531 Weakness: Secondary | ICD-10-CM | POA: Diagnosis not present

## 2015-01-10 DIAGNOSIS — Z66 Do not resuscitate: Secondary | ICD-10-CM | POA: Diagnosis present

## 2015-01-10 DIAGNOSIS — R296 Repeated falls: Secondary | ICD-10-CM | POA: Diagnosis present

## 2015-01-10 DIAGNOSIS — E039 Hypothyroidism, unspecified: Secondary | ICD-10-CM | POA: Diagnosis present

## 2015-01-10 LAB — BASIC METABOLIC PANEL
Anion gap: 7 (ref 5–15)
BUN: 10 mg/dL (ref 6–20)
CHLORIDE: 95 mmol/L — AB (ref 101–111)
CO2: 26 mmol/L (ref 22–32)
CREATININE: 0.87 mg/dL (ref 0.44–1.00)
Calcium: 9.8 mg/dL (ref 8.9–10.3)
GFR calc Af Amer: >60 mL/min (ref >60)
GLUCOSE: 151 mg/dL — AB (ref 65–99)
Potassium: 4.3 mmol/L (ref 3.5–5.1)
Sodium: 128 mmol/L — ABNORMAL LOW (ref 135–145)

## 2015-01-10 LAB — GLUCOSE, CAPILLARY
GLUCOSE-CAPILLARY: 81 mg/dL (ref 65–99)
GLUCOSE-CAPILLARY: 109 mg/dL — AB (ref 65–99)
GLUCOSE-CAPILLARY: 195 mg/dL — AB (ref 65–99)
GLUCOSE-CAPILLARY: 241 mg/dL — AB (ref 65–99)
GLUCOSE-CAPILLARY: 277 mg/dL — AB (ref 65–99)
Glucose-Capillary: 110 mg/dL — ABNORMAL HIGH (ref 65–99)
Glucose-Capillary: 129 mg/dL — ABNORMAL HIGH (ref 65–99)
Glucose-Capillary: 156 mg/dL — ABNORMAL HIGH (ref 65–99)
Glucose-Capillary: 313 mg/dL — ABNORMAL HIGH (ref 65–99)

## 2015-01-10 LAB — CBC
HCT: 34.1 % — ABNORMAL LOW (ref 36.0–46.0)
Hemoglobin: 11.4 g/dL — ABNORMAL LOW (ref 12.0–15.0)
MCH: 32 pg (ref 26.0–34.0)
MCHC: 33.4 g/dL (ref 30.0–36.0)
MCV: 95.8 fL (ref 78.0–100.0)
PLATELETS: 128 10*3/uL — AB (ref 150–400)
RBC: 3.56 MIL/uL — ABNORMAL LOW (ref 3.87–5.11)
RDW: 13.2 % (ref 11.5–15.5)
WBC: 2.9 10*3/uL — ABNORMAL LOW (ref 4.0–10.5)

## 2015-01-10 LAB — URINE CULTURE
COLONY COUNT: NO GROWTH
CULTURE: NO GROWTH

## 2015-01-10 MED ORDER — MORPHINE SULFATE 2 MG/ML IJ SOLN: 1.0000 mg | INTRAMUSCULAR | Status: DC | PRN

## 2015-01-10 NOTE — Progress Notes (Addendum)
Patient ID: Carol Bond, female   DOB: 12/12/1955, 59 y.o.   MRN: [id number] [facility] PROGRESS NOTE  Carol Bond CBJ:[id number] DOB: August 30, 1955 DOA: 01/09/2015 PCP: [name], MD  Brief narrative:    59 y.o. 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 She presented to [facility] ED status post fall and worsening mental status changes. Per her daughter she had rapid eye movement and she thought her mother had seizures. She was recently seen in Oncology office and UA and subsequent urine culture (01/05/15) revealed UTI due to pseudomonas. She was given Cipro for the UTI. On EMS arrival, patient was found to have hypoglycemia for which she was given D50 infusion.  In ED, pt was combative but hemodynamically stable. Blood work was notable for hemoglobin of 11.1, platelets of 133, creatinine of 1.1. UA revealed many bacteria but no leukocytes. She was started on empiric Rocephin.   Barrier to discharge: pt lethargic, receiving IV solu-medrol and IV rocephin. May possibly be discharge 01/12/2015. Also family concerned about patient's risk of falls at home. She has had increased falls in last week or so. PT eval order placed.    Assessment/Plan:    Principal Problem: Acute metabolic encephalopathy - Secondary to UTI in addition to known brain metastases - No significant changes in mental status since admission per family  - CT head showed findings of post radiation therapy changes. - Obtain PT evaluation.   Active Problems: UTI / Leukopenia - UA with many bacteria, no leukocytes - WBC count 2.9 but no neutropenia - Recent Urine culture 01/05/15 grew pseudomonas but urine culture on this admission shows no growth.  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 Anticipate D/C by Monday 01/12/2015.   IV access:  Peripheral IV  Procedures and diagnostic studies:    Dg Chest 2 View 01/09/2015  1. Persistent left pleural effusion, likely malignant. 2. Nodular disease at the left base and along the cardiac border compatible with known metastases. 3. Scarring or tumor in the left upper lobe is stable. 4. No acute abnormality or significant interval change.   Electronically Signed   By: San Morelle M.D.   On: 01/09/2015 08:03   Ct Head Wo Contrast 01/09/2015 Extensive periventricular decreased attenuation, probably enlarged part due to post radiation therapy change. There may be underlying small vessel  disease. On this noncontrast enhanced study, there is no mass or edema. No hemorrhage. No acute appearing infarct. No extra-axial fluid collections. There is bilateral mastoid air cell disease which may in part be due to radiation therapy change.   Medical Consultants:  None   Other Consultants:  Physical therapy    IAnti-Infectives:   Rocephin 01/09/2015 -->    Leisa Lenz, MD  [facility] Pager [phone]  Time spent in minutes: 25 minutes  If 7PM-7AM, please contact night-coverage [email or web address] Password [email or web address] 01/10/2015, 1:23 PM   LOS: 1 day    HPI/Subjective: No acute overnight events. Patient lethargic this am.  Objective: Filed Vitals:   01/09/15 1135 01/09/15 1456 01/09/15 2109 01/10/15 0546  BP: 91/74 121/90 132/85 127/84  Pulse: 106 101 98 80  Temp:  98.6 F (37 C) 98.7 F (37.1 C) 97.4 F (36.3 C)  TempSrc:  Axillary Axillary Axillary  Resp: '20 20 20 20  '$ Height:  '5\' 6"'$  (1.676 m)    Weight:  75.297 kg (166 lb)    SpO2: 96% 100% 98% 98%    Intake/Output Summary (Last 24 hours) at 01/10/15 1323 Last data filed at 01/10/15 0700  Gross per 24 hour  Intake 1808.34 ml  Output      1 ml  Net 1807.34 ml    Exam:   General:  Pt is lethargic, no distress, has mittens on  Cardiovascular: Regular rate and rhythm, S1/S2, no murmurs  Respiratory: Clear to auscultation bilaterally, no wheezing, no crackles, no rhonchi  Abdomen: Soft, non tender, non distended, bowel sounds present  Extremities: No edema, pulses DP and PT palpable bilaterally  Neuro: Grossly nonfocal  Data Reviewed: Basic Metabolic Panel:  Recent Labs Lab 01/05/15 1007 01/05/15 1009 01/09/15 0853 01/09/15 0900 01/10/15 0523  NA  --  141 138 139 128*  K  --  3.9 3.7 3.8 4.3  CL  --   --  102 104 95*  CO2  --  22 25  --  26  GLUCOSE  --  113 28* 28* 151*  BUN  --  16.9 24* 23* 10  CREATININE  --  0.7 1.10* 1.00 0.87  CALCIUM  --  10.0 9.9  --  9.8  MG 2.1  --   --   --   --    Liver Function Tests:  Recent Labs Lab 01/05/15 1009 01/09/15 0853  AST 18 18  ALT 24 17  ALKPHOS 46 35*  BILITOT 0.34 0.5  PROT 6.9 6.9  ALBUMIN 4.2 4.4   No results for input(s): LIPASE, AMYLASE in the last 168 hours. No results for input(s): AMMONIA in the last 168 hours. CBC:  Recent Labs Lab 01/05/15 1009 01/09/15 0853 01/09/15 0900 01/10/15 0523   WBC 2.5* 4.1  --  2.9*  NEUTROABS 1.7 3.3  --   --   HGB 12.2 11.1* 11.6* 11.4*  HCT 36.6 32.6* 34.0* 34.1*  MCV 97.1 95.6  --  95.8  PLT 118* 133*  --  128*   Cardiac Enzymes: No results for input(s): CKTOTAL, CKMB, CKMBINDEX, TROPONINI in the last 168 hours. BNP: Invalid input(s): POCBNP CBG:  Recent Labs Lab 01/09/15 2310 01/10/15 0159 01/10/15 0405 01/10/15 0738 01/10/15 1248  GLUCAP 94 109* 129* 156* 195*    Recent Results (from the past 240 hour(s))  Urine culture     Status: None   Collection Time: 01/05/15 10:08 AM  Result Value Ref Range Status   Urine Culture, Routine Culture,  Urine  Final    Comment: Final - ===== COLONY COUNT: ===== >=100,000 COLONIES/ML PSEUDOMONAS AERUGINOSA  ------------------------------------------------------------------------  PSEUDOMONAS AERUGINOSA     PIPERACILLIN/TAZO                MIC      Sensitive        <=4 ug/ml    IMIPENEM                         MIC      Sensitive          2 ug/ml    CEFTAZIDIME                      MIC      Sensitive          4 ug/ml    CEFEPIME                         MIC      Sensitive          4 ug/ml    GENTAMICIN                       MIC      Sensitive        <=1 ug/ml    TOBRAMYCIN                       MIC      Sensitive        <=1 ug/ml    CIPROFLOXACIN                    MIC      Sensitive     <=0.25 ug/ml    LEVOFLOXACIN                     MIC      Sensitive          1 ug/ml END OF REPORT   Urine culture     Status: None   Collection Time: 01/09/15  9:35 AM  Result Value Ref Range Status   Specimen Description URINE, CATHETERIZED  Final   Special Requests NONE  Final   Colony Count NO GROWTH Performed at [facility]   Final   Culture NO GROWTH Performed at [facility]   Final   Report Status 01/10/2015 FINAL  Final     Scheduled Meds: . cefTRIAXone (ROCEPHIN)  IV  1 g Intravenous Q24H  . escitalopram  10 mg Oral QHS  . methylPREDNISolone (SOLU-MEDROL)  injection  5.2 mg Intravenous Q6H  . temozolomide  360 mg Oral Once   Continuous Infusions: . dextrose 10 % 1,000 mL infusion 100 mL/hr at 01/10/15 0046

## 2015-01-11 ENCOUNTER — Inpatient Hospital Stay (HOSPITAL_COMMUNITY): Payer: 59

## 2015-01-11 DIAGNOSIS — R531 Weakness: Secondary | ICD-10-CM

## 2015-01-11 LAB — GLUCOSE, CAPILLARY
GLUCOSE-CAPILLARY: 210 mg/dL — AB (ref 65–99)
GLUCOSE-CAPILLARY: 259 mg/dL — AB (ref 65–99)
GLUCOSE-CAPILLARY: 257 mg/dL — AB (ref 65–99)
Glucose-Capillary: 258 mg/dL — ABNORMAL HIGH (ref 65–99)
Glucose-Capillary: 298 mg/dL — ABNORMAL HIGH (ref 65–99)

## 2015-01-11 NOTE — Progress Notes (Addendum)
Patient ID: Carol Bond, female   DOB: December 06, 1955, 59 y.o.   MRN: [id number] [facility] PROGRESS NOTE  Carol Bond UXN:[id number] DOB: 10-23-1955 DOA: 01/09/2015 PCP: [name], MD  Brief narrative:    59 y.o. 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 She presented to [facility] ED status post fall and worsening mental status changes. Per her daughter she had rapid eye movement and she thought her mother had seizures. She was recently seen in Oncology office and UA and subsequent urine culture (01/05/15) revealed UTI due to pseudomonas. She was given Cipro for the UTI. When EMS arrived pt was found to have hypoglycemia which has improved with D50. In ED, work up revealed UTI. In addition, pt was combative and has received ativan which made her lethargic.  In past 24 hours, pt's mental status better, she is more conversant and follows commands.   Barrier to discharge: This morning she is more alert and able to follow simple commands. On physical exam she appears to have difficulty raising her left arm and leg while no problem doing so with right arm and leg. However, she subsequently lifter her left arm spontaneously. We will obtain MRI brain to evaluate the progression of brain metastases    Assessment/Plan:    Principal Problem: Acute metabolic encephalopathy / Left arm and leg weakness - Secondary to UTI and possibly already known brain metastases - Mental status better this am but ? Left arm and leg weakness - CT head on admission did not show acute intracranial findings.  - Obtain MRI brain for further evaluation of left arm and weakness - PT evaluation pending    Active Problems: UTI / Leukopenia - UA with many bacteria, no leukocytes - Urine culture on this admission shows no growth but on 5/9 (urine culture obtained in cancer center did grow pseudomonas) - Pt on Rocephin daily empirically -  Follow up CBC in am  Brain metastases / Possible seizures  - Continue solu-medrol 5.2 mg IV Q 6 hours  - Monitor for seizures - Obtain MRI brain and EEG  Small cell lung cancer - Management per oncology - Continue temodar   Left pleural effusion, probably malignant - Left pleural effusion seen on CXR likely malignant - Resp status remains stable.  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 - When pt ready for D/C we will reduce dose of synthroid to 25 mcg daily. At home she is on 150 mcg daily.  Generalized weakness / Fall - Apparently pt has had few falls at home recently - PT eval for safe discharge plan  DVT Prophylaxis  - SCD's bilaterally    Code Status: DNR/DNI Family Communication:  plan of care discussed with the patient's husband and daughter  Disposition Plan: Home likely Monday if MRI ok and if pt feels better. D/C anticipated 01/12/2015.  IV access:  Peripheral IV  Procedures and diagnostic studies:    Dg Chest 2 View 01/09/2015  1. Persistent left pleural effusion, likely malignant. 2. Nodular disease at the left base and along the cardiac border compatible with  known metastases. 3. Scarring or tumor in the left upper lobe is stable. 4. No acute abnormality or significant interval change.   Electronically Signed   By: San Morelle M.D.   On: 01/09/2015 08:03   Ct Head Wo Contrast 01/09/2015 Extensive periventricular decreased attenuation, probably enlarged part due to post radiation therapy change. There may be underlying small vessel disease. On this noncontrast enhanced study, there is no mass or edema. No  hemorrhage. No acute appearing infarct. No extra-axial fluid collections. There is bilateral mastoid air cell disease which may in part be due to radiation therapy change.   Medical Consultants:  None   Other Consultants:  Physical therapy    IAnti-Infectives:   Rocephin 01/09/2015 -->    Leisa Lenz, MD  [facility] Pager [phone]  Time spent in minutes: 25 minutes  If 7PM-7AM, please contact night-coverage [email or web address] Password [email or web address] 01/11/2015, 4:07 PM   LOS: 2 days    HPI/Subjective: No acute overnight events. Patient better this am. No vomiting, no chest pain.  Objective: Filed Vitals:   01/10/15 1300 01/10/15 2146 01/11/15 0455 01/11/15 1429  BP: 137/69 130/96 146/84 131/56  Pulse: 83 76 62 73  Temp: 98 F (36.7 C) 98.1 F (36.7 C) 98.1 F (36.7 C) 98.3 F (36.8 C)  TempSrc: Oral Oral Oral Oral  Resp: '20 18 16 18  '$ Height:      Weight:      SpO2: 98% 96% 98% 98%    Intake/Output Summary (Last 24 hours) at 01/11/15 1607 Last data filed at 01/11/15 1430  Gross per 24 hour  Intake   2300 ml  Output      0 ml  Net   2300 ml    Exam:   General:  Pt is alert, awake this am  Cardiovascular: RRR, appreciate S1, S2  Respiratory: No wheezing, no crackles, no rhonchi  Abdomen: Non tender, non distended, (+) BS  Extremities: No leg swelling, pulses palpable   Neuro: Left leg weakness, normal strength RLE  Data Reviewed: Basic Metabolic Panel:  Recent Labs Lab 01/05/15 1007 01/05/15 1009 01/09/15 0853 01/09/15 0900 01/10/15 0523  NA  --  141 138 139 128*  K  --  3.9 3.7 3.8 4.3  CL  --   --  102 104 95*  CO2  --  22 25  --  26  GLUCOSE  --  113 28* 28* 151*  BUN  --  16.9 24* 23* 10  CREATININE  --  0.7 1.10* 1.00 0.87  CALCIUM  --  10.0 9.9  --  9.8  MG 2.1  --   --   --   --    Liver Function Tests:  Recent Labs Lab 01/05/15 1009 01/09/15 0853  AST 18 18  ALT 24 17  ALKPHOS 46 35*  BILITOT 0.34 0.5  PROT 6.9 6.9   ALBUMIN 4.2 4.4   No results for input(s): LIPASE, AMYLASE in the last 168 hours. No results for input(s): AMMONIA in the last 168 hours. CBC:  Recent Labs Lab 01/05/15 1009 01/09/15 0853 01/09/15 0900 01/10/15 0523  WBC 2.5* 4.1  --  2.9*  NEUTROABS 1.7 3.3  --   --   HGB 12.2 11.1* 11.6* 11.4*  HCT 36.6 32.6* 34.0* 34.1*  MCV 97.1 95.6  --  95.8  PLT 118* 133*  --  128*   Cardiac Enzymes: No results for input(s): CKTOTAL, CKMB, CKMBINDEX, TROPONINI in the last 168 hours. BNP: Invalid input(s): POCBNP  CBG:  Recent Labs Lab 01/10/15 1920 01/10/15 2332 01/11/15 0351 01/11/15 0751 01/11/15 1152  GLUCAP 277* 313* 258* 210* 259*    Recent Results (from the past 240 hour(s))  Urine culture     Status: None   Collection Time: 01/05/15 10:08 AM  Result Value Ref Range Status   Urine Culture, Routine Culture, Urine  Final    Comment: Final - ===== COLONY COUNT: ===== >=100,000 COLONIES/ML PSEUDOMONAS AERUGINOSA  ------------------------------------------------------------------------  PSEUDOMONAS AERUGINOSA     PIPERACILLIN/TAZO                MIC      Sensitive        <=4 ug/ml    IMIPENEM                         MIC      Sensitive          2 ug/ml    CEFTAZIDIME                      MIC      Sensitive          4 ug/ml    CEFEPIME                         MIC      Sensitive          4 ug/ml    GENTAMICIN                       MIC      Sensitive        <=1 ug/ml    TOBRAMYCIN                       MIC      Sensitive        <=1 ug/ml    CIPROFLOXACIN                    MIC      Sensitive     <=0.25 ug/ml    LEVOFLOXACIN                     MIC      Sensitive          1 ug/ml END OF REPORT   Urine culture     Status: None   Collection Time: 01/09/15  9:35 AM  Result Value Ref Range Status   Specimen Description URINE, CATHETERIZED  Final   Special Requests NONE  Final   Colony Count NO GROWTH Performed at [facility]   Final   Culture NO  GROWTH Performed at [facility]   Final   Report Status 01/10/2015 FINAL  Final     Scheduled Meds: . cefTRIAXone (ROCEPHIN)  IV  1 g Intravenous Q24H  . escitalopram  10 mg Oral QHS  . methylPREDNISolone (SOLU-MEDROL) injection  5.2 mg Intravenous Q6H  . temozolomide  360 mg Oral Once   Continuous Infusions: . dextrose 10 % 1,000 mL infusion 100 mL/hr at 01/11/15 [phone]

## 2015-01-11 NOTE — Evaluation (Signed)
Physical Therapy Evaluation Patient Details Name: Carol Bond MRN: [id number] DOB: [birth year] Today's Date: 01/11/2015   History of Present Illness  59 y.o. 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 She presented to [facility] ED status post fall and worsening mental status changes. Per her daughter she had rapid eye movement and she thought her motherhad seizures. She was recently seen in Oncology office and UA  revealed UTI due to pseudomonas. On EMS arrival, patient was found to have hypoglycemia.o became combative in ED.Marland Kitchen  Clinical Impression  Patient requires extensive assistance for all mobility. Did not attempt OOB, would require mechanical lift. Patient very slow to respond and follow. Daughter present. Patient will benefit from PT trial for  Addressing problems listed below.    Follow Up Recommendations SNF;Supervision/Assistance - 24 hour    Equipment Recommendations  None recommended by PT    Recommendations for Other Services       Precautions / Restrictions Precautions Precautions: Fall      Mobility  Bed Mobility Overal bed mobility: Needs Assistance;+2 for physical assistance;+ 2 for safety/equipment Bed Mobility: Rolling;Sidelying to Sit;Sit to Sidelying Rolling: +2 for physical assistance;+2 for safety/equipment;Total assist Sidelying to sit: Total assist;+2 for physical assistance;+2 for safety/equipment     Sit to sidelying: Total assist;+2 for physical assistance;+2 for safety/equipment General bed mobility comments: hand over hand to reach to rails, bend knees. Rolled  multiple times for bed to be changed and get onto bedpan.  Transfers                    Ambulation/Gait                Stairs            Wheelchair Mobility    Modified Rankin (Stroke Patients Only)       Balance Overall balance assessment: Needs assistance;History of Falls Sitting-balance  support: Bilateral upper extremity supported;Feet supported Sitting balance-Leahy Scale: Zero                                       Pertinent Vitals/Pain Pain Assessment: No/denies pain    Home Living Family/patient expects to be discharged to:: Private residence Living Arrangements: Spouse/significant other Available Help at Discharge: Family;Available PRN/intermittently           Home Equipment: Walker - 2 wheels Additional Comments: was home alone x 8 hours until friday    Prior Function Level of Independence: Independent with assistive device(s)               Hand Dominance        Extremity/Trunk Assessment               Lower Extremity Assessment: Generalized weakness         Communication   Communication: Expressive difficulties  Cognition Arousal/Alertness: Awake/alert Behavior During Therapy: Flat affect Overall Cognitive Status: Impaired/Different from baseline Area of Impairment: Attention;Following commands Orientation Level: Time     Following Commands: Follows one step commands inconsistently       General Comments: O to     General Comments      Exercises        Assessment/Plan    PT Assessment Patient needs continued PT services  PT Diagnosis Generalized weakness;Altered mental status   PT Problem List Decreased strength;Decreased activity tolerance;Decreased mobility;Decreased  balance;Decreased cognition;Decreased knowledge of use of DME  PT Treatment Interventions Functional mobility training;Therapeutic activities;Therapeutic exercise;Balance training;Patient/family education   PT Goals (Current goals can be found in the Care Plan section) Acute Rehab PT Goals Patient Stated Goal: per daughter- to improve PT Goal Formulation: With family Time For Goal Achievement: 01/25/15 Potential to Achieve Goals: Fair    Frequency Min 3X/week   Barriers to discharge Decreased caregiver support       Co-evaluation               End of Session   Activity Tolerance: Patient limited by fatigue Patient left: in bed;with bed alarm set;with family/visitor present Nurse Communication: Mobility status         Time: 2500-3704 PT Time Calculation (min) (ACUTE ONLY): 25 min   Charges:   PT Evaluation $Initial PT Evaluation Tier I: 1 Procedure PT Treatments $Therapeutic Activity: 8-22 mins   PT G Codes:        Claretha Cooper 01/11/2015, 5:25 PM

## 2015-01-12 ENCOUNTER — Inpatient Hospital Stay (HOSPITAL_COMMUNITY)
Admit: 2015-01-12 | Discharge: 2015-01-12 | Disposition: A | Discharge status: Home / Self Care | Payer: 59 | Attending: Internal Medicine | Admitting: Internal Medicine

## 2015-01-12 LAB — BASIC METABOLIC PANEL
Anion gap: 12 (ref 5–15)
BUN: 16 mg/dL (ref 6–20)
CHLORIDE: 90 mmol/L — AB (ref 101–111)
CO2: 23 mmol/L (ref 22–32)
Calcium: 9.1 mg/dL (ref 8.9–10.3)
Creatinine, Ser: 0.66 mg/dL (ref 0.44–1.00)
GFR calc Af Amer: >60 mL/min (ref >60)
GFR calc non Af Amer: >60 mL/min (ref >60)
GLUCOSE: 317 mg/dL — AB (ref 65–99)
Potassium: 3.6 mmol/L (ref 3.5–5.1)
Sodium: 125 mmol/L — ABNORMAL LOW (ref 135–145)

## 2015-01-12 LAB — GLUCOSE, CAPILLARY
GLUCOSE-CAPILLARY: 214 mg/dL — AB (ref 65–99)
GLUCOSE-CAPILLARY: 246 mg/dL — AB (ref 65–99)
GLUCOSE-CAPILLARY: 249 mg/dL — AB (ref 65–99)
GLUCOSE-CAPILLARY: 319 mg/dL — AB (ref 65–99)
Glucose-Capillary: 208 mg/dL — ABNORMAL HIGH (ref 65–99)
Glucose-Capillary: 204 mg/dL — ABNORMAL HIGH (ref 65–99)

## 2015-01-12 NOTE — Progress Notes (Signed)
Physical Therapy Treatment Patient Details Name: Carol Bond MRN: [id number] DOB: 09/27/1955 Today's Date: 01/12/2015    History of Present Illness 59 y.o. 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 She presented to [facility] ED status post fall and worsening mental status changes. Per her daughter she had rapid eye movement and she thought her motherhad seizures. She was recently seen in Oncology office and UA  revealed UTI due to pseudomonas. On EMS arrival, patient was found to have hypoglycemia.o became combative in ED.Marland Kitchen    PT Comments    Level of cognition/alertness slowly improving.  Pt appropriately speaking few words/part sentences.  Recognized all family members.  Following repeat/functionally cueing and using L UE/LE more. Able to hold a cup briefly to drink.  Assisted pt from supine to EOB + 2 assist.  Static sitting x 5 min required Mod assist.  Posterior lean and delayed self corrective reaction to midline. Assisted with sit to stand + 2 assist with hand over hand cueing onto EVA walker.  Used B platform EVA walker to amb pt in hallway.  Slow and groggy pt was able to amb with + 2 assist and spouse following with recliner. B hips and knees flex, shuffled steps but pt was able to advance B LE's on her own.  Returned to room and positioned in recliner.   Follow Up Recommendations  SNF     Equipment Recommendations  None recommended by PT    Recommendations for Other Services       Precautions / Restrictions Precautions Precautions: Fall    Mobility  Bed Mobility Overal bed mobility: Needs Assistance;+2 for physical assistance;+ 2 for safety/equipment Bed Mobility: Supine to Sit   Sidelying to sit: +2 for physical assistance;+2 for safety/equipment;Max assist       General bed mobility comments: required repeat functional cueing and increased time to process and initiate.  Extra assist to scoot to EOB.   Delayed self coorective balance with posterior lean.  Groogy. Required Mod Assist for static sitting balance and MAX assist for dynamic sitting balance activities.   Transfers Overall transfer level: Needs assistance Equipment used: Bilateral platform walker (EVA walker) Transfers: Sit to/from Stand Sit to Stand: +2 physical assistance;+2 safety/equipment;Total assist (pt 25%)         General transfer comment: 75% functional/simple VC's.  Pt unable to functionally push off bed.  So + 2 HHA was needed.  Once upright, required hand over hand cueing to place B UE's on EVA walker and grip handles (pt 25%)  Ambulation/Gait Ambulation/Gait assistance: +2 physical assistance;+2 safety/equipment;Max assist Ambulation Distance (Feet): 45 Feet Assistive device: Bilateral platform walker (EVA walker) Gait Pattern/deviations: Step-to pattern;Step-through pattern;Decreased step length - right;Decreased step length - left;Decreased stride length;Shuffle;Drifts right/left;Trunk flexed;Narrow base of support Gait velocity: decreased   General Gait Details: Used B platform EVA walker for increased support/safety due to pt's groggy/slow state.  Pt was able to support herslef enough to amb.  Slow/sluggish/shuffled steps.  75% VC's to "pick feet up".  Spouse assisted by following with recliner as she amb in the hallway.  B hips and knees slightly flex.  VC's for upright posture which pt did correct briefly.  Cognition improving but a main factor for decreased mobility.  Had daughter in front to encourage pt while she walked.    Stairs            Wheelchair Mobility    Modified Rankin (Stroke Patients Only)  Balance                                    Cognition Arousal/Alertness: Awake/alert Behavior During Therapy:  (groggy/slow) Overall Cognitive Status: Impaired/Different from baseline Area of Impairment: Safety/judgement;Awareness;Problem solving Orientation Level:  Place;Time Current Attention Level: Alternating   Following Commands: Follows one step commands with increased time     Problem Solving: Slow processing;Requires verbal cues General Comments: improving    Exercises      General Comments        Pertinent Vitals/Pain Pain Assessment: No/denies pain    Home Living                      Prior Function            PT Goals (current goals can now be found in the care plan section) Progress towards PT goals: Progressing toward goals    Frequency  Min 3X/week    PT Plan      Co-evaluation             End of Session Equipment Utilized During Treatment: Gait belt Activity Tolerance: Patient limited by lethargy (groggy/slow/delayed) Patient left: in chair;with family/visitor present     Time: 9977-4142 PT Time Calculation (min) (ACUTE ONLY): 32 min  Charges:  $Gait Training: 8-22 mins $Therapeutic Activity: 8-22 mins                    G Codes:      Rica Koyanagi  PTA [facility]  Acute  Rehab Pager      [phone]

## 2015-01-12 NOTE — Progress Notes (Signed)
Patient ID: Carol Bond, female   DOB: [birth year], 59 y.o.   MRN: [id number] [facility] PROGRESS NOTE  Carol Bond WNI:[id number] DOB: December 26, 1955 DOA: 01/09/2015 PCP: [name], MD  Brief narrative:    59 y.o. 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 She presented to [facility] ED status post fall and worsening mental status changes. Per her daughter she had rapid eye movement and she thought her mother had seizures. She was recently seen in Oncology office and UA and subsequent urine culture (01/05/15) revealed UTI due to pseudomonas. She was given Cipro for the UTI. When EMS arrived pt was found to have hypoglycemia which has improved with D50. In ED, work up revealed UTI.   Barrier to discharge: EEG is pending. Social work assisting with discharge plan to skilled nursing facility. Anticipated discharge 01/13/2015 to skilled nursing facility.   Assessment/Plan:    Principal Problem: Acute metabolic encephalopathy / Left arm and leg weakness - Secondary to UTI and possibly already known brain metastases - Mental status is better this morning. - In regards to left arm and leg weakness it seems to have completely resolved. Patient is moving extremities with no difficulty. Per family request we have canceled MRI. - Still awaiting EEG. - CT head on the admission did not reveal acute intracranial findings. - Physical therapy has seen the patient and recommend skilled nursing facility placement. Family is agreeable to skilled nursing facility placement.   Active Problems: UTI / Leukopenia - UA with many bacteria, no leukocytes - Urine culture on this admission shows no growth but on 5/9 (urine culture obtained in cancer center did grow pseudomonas) - Pt on Rocephin daily. Will stop today.  Brain metastases / Possible seizures  - Continue solu-medrol 5.2 mg IV Q 6 hours  - EEG pending.   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   Hypothyroidism - TSH on this admission 0.08 - Synthroid was placed on hold in hospital. - When pt ready for D/C we will reduce dose of synthroid to 25 mcg daily. At home she is on 150 mcg daily.  Generalized weakness / Fall - D/C to SNF tomorrow 01/13/2015.   DVT Prophylaxis  - SCD's bilaterally    Code Status: DNR/DNI Family Communication:  plan of care discussed with the patient's husband and daughter  Disposition Plan: To skilled nursing facility likely Tuesday, 01/13/2015.  IV access:  Peripheral IV  Procedures and diagnostic studies:    Dg Chest 2 View 01/09/2015  1. Persistent left pleural effusion, likely malignant. 2. Nodular disease at the left base and along the cardiac border compatible with known metastases. 3. Scarring or tumor in the left upper lobe is stable. 4. No acute abnormality or significant interval change.   Electronically Signed   By: San Morelle M.D.   On: 01/09/2015 08:03   Ct Head Wo Contrast 01/09/2015 Extensive periventricular decreased attenuation, probably enlarged part due to post radiation therapy change. There may be underlying small vessel disease.  On this noncontrast enhanced study, there is no mass or edema. No hemorrhage. No acute appearing infarct. No extra-axial fluid collections. There is bilateral mastoid air cell disease which may in part be due to radiation therapy change.   Medical Consultants:  None   Other Consultants:  Physical therapy   Social  work  IAnti-Infectives:   Rocephin 01/09/2015 --> 01/12/2015.   Leisa Lenz, MD  [facility] Pager [phone]  Time spent in minutes: 15 minutes  If 7PM-7AM, please contact night-coverage [email or web address] Password [facility] 01/12/2015, 11:38 AM   LOS: 3 days    HPI/Subjective: No acute overnight events. Patient reports feeling better this morning. More conversant.  Objective: Filed Vitals:   01/11/15 0455 01/11/15 1429 01/11/15 2005 01/12/15 0536  BP: 146/84 131/56 141/80 133/68  Pulse: 62 73 67 65  Temp: 98.1 F (36.7 C) 98.3 F (36.8 C) 98.1 F (36.7 C) 98.2 F (36.8 C)  TempSrc: Oral Oral Oral Oral  Resp: '16 18 18 18  '$ Height:      Weight:      SpO2: 98% 98% 98% 99%    Intake/Output Summary (Last 24 hours) at 01/12/15 1138 Last data filed at 01/12/15 0900  Gross per 24 hour  Intake   2720 ml  Output      0 ml  Net   2720 ml    Exam:   General:  Pt is more alert this morning, no distress  Cardiovascular: Regular rate and rhythm, S1-S2 appreciated  Respiratory: Bilateral air entry, no wheezing  Abdomen: Nontender and not distended abdomen, appreciate bowel sounds  Extremities: Pulses palpable, no lower extremity edema  Neuro: Nonfocal  Data Reviewed: Basic Metabolic Panel:  Recent Labs Lab 01/09/15 0853 01/09/15 0900 01/10/15 0523  NA 138 139 128*  K 3.7 3.8 4.3  CL 102 104 95*  CO2 25  --  26  GLUCOSE 28* 28* 151*  BUN 24* 23* 10  CREATININE 1.10* 1.00 0.87  CALCIUM 9.9  --  9.8   Liver Function Tests:  Recent Labs Lab 01/09/15 0853  AST 18  ALT 17  ALKPHOS 35*  BILITOT 0.5  PROT 6.9  ALBUMIN 4.4   No results for input(s): LIPASE, AMYLASE in the last 168 hours. No results for input(s): AMMONIA in the last 168 hours. CBC:  Recent Labs Lab 01/09/15 0853 01/09/15 0900 01/10/15 0523  WBC 4.1  --  2.9*  NEUTROABS 3.3  --   --   HGB 11.1* 11.6* 11.4*  HCT 32.6* 34.0* 34.1*  MCV 95.6  --  95.8  PLT 133*  --  128*    Cardiac Enzymes: No results for input(s): CKTOTAL, CKMB, CKMBINDEX, TROPONINI in the last 168 hours. BNP: Invalid input(s): POCBNP CBG:  Recent Labs Lab 01/11/15 1702 01/11/15 2001 01/12/15 0020 01/12/15 0522 01/12/15 0732  GLUCAP 298* 257* 249* 214* 246*    Recent Results (from the past 240 hour(s))  Urine culture     Status: None   Collection Time: 01/05/15 10:08 AM  Result Value Ref Range Status   Urine Culture, Routine Culture, Urine  Final    Comment: Final - ===== COLONY COUNT: ===== >=100,000 COLONIES/ML PSEUDOMONAS AERUGINOSA  ------------------------------------------------------------------------  PSEUDOMONAS AERUGINOSA     PIPERACILLIN/TAZO                MIC      Sensitive        <=4 ug/ml    IMIPENEM  MIC      Sensitive          2 ug/ml    CEFTAZIDIME                      MIC      Sensitive          4 ug/ml    CEFEPIME                         MIC      Sensitive          4 ug/ml    GENTAMICIN                       MIC      Sensitive        <=1 ug/ml    TOBRAMYCIN                       MIC      Sensitive        <=1 ug/ml    CIPROFLOXACIN                    MIC      Sensitive     <=0.25 ug/ml    LEVOFLOXACIN                     MIC      Sensitive          1 ug/ml END OF REPORT   Urine culture     Status: None   Collection Time: 01/09/15  9:35 AM  Result Value Ref Range Status   Specimen Description URINE, CATHETERIZED  Final   Special Requests NONE  Final   Colony Count NO GROWTH Performed at [facility]   Final   Culture NO GROWTH Performed at [facility]   Final   Report Status 01/10/2015 FINAL  Final     Scheduled Meds: . cefTRIAXone (ROCEPHIN)  IV  1 g Intravenous Q24H  . escitalopram  10 mg Oral QHS  . methylPREDNISolone (SOLU-MEDROL) injection  5.2 mg Intravenous Q6H  . temozolomide  360 mg Oral Once   Continuous Infusions: . dextrose 10 % 1,000 mL infusion 100 mL/hr at 01/12/15 0518

## 2015-01-12 NOTE — Progress Notes (Signed)
[facility]  Patient Status: Active (receiving services up to time of hospitalization)  [facility] is providing the following services: RN and PT  If patient discharges after hours, please call [phone].   Lurlean Leyden 01/12/2015, 9:41 AM

## 2015-01-12 NOTE — Procedures (Addendum)
ELECTROENCEPHALOGRAM REPORT   Patient: Carol Bond       Room #: KG8811 EEG No. ID: [id number] Age: 59 y.o.        Sex: female Referring Physician: Charlies Silvers Report Date:  01/12/2015        Interpreting Physician: Alexis Goodell  History: Carol Bond is an 59 y.o. female s/p fall with worsening mental status  Medications:  Scheduled: . escitalopram  10 mg Oral QHS  . methylPREDNISolone (SOLU-MEDROL) injection  5.2 mg Intravenous Q6H  . temozolomide  360 mg Oral Once    Conditions of Recording:  This is a 16 channel EEG carried out with the patient in the awake and drowsy states.  Patient is uncooperative.    Description:  The posterior background activity consists of a low voltage, symmetrical, fairly well organized, 7 Hz theta activity, seen from the parieto-occipital and posterior temporal regions.  This activity is poorly sustained and although the 7 Hz theta is the predominant posterior background rhythm there are periods of poorly sustained rhythms that are faster and slower than this 7 Hz.  Low voltage fast activity, poorly organized, is seen anteriorly and is at times superimposed on more posterior regions.  A mixture of theta and alpha rhythms are seen from the central and temporal regions.  This background activity often takes on characteristics that suggest it may represent drowse.   No epileptiform activity is noted.   Stage II sleep is not obtained. Hyperventilation and intermittent photic stimulation were not performed. .  IMPRESSION: This EEG is characterized by a slow posterior background rhythm.  Although there are periods of the recording that are suggestive of normal drowse can not rule out the possibility of slowing related to a diffuse gray matter disturbance that is etiologically nonspecific but may include a dementia or encephalopathy, among other possibilities.  No epileptiform activity is noted.     Alexis Goodell, MD [facility] [phone] 01/12/2015, 3:28 PM

## 2015-01-12 NOTE — Progress Notes (Signed)
EEG completed, results pending. 

## 2015-01-12 NOTE — Clinical Social Work Note (Signed)
Clinical Social Work Assessment  Patient Details  Name: Carol Bond MRN: [id number] Date of Birth: 1956/08/18  Date of referral:  01/12/15               Reason for consult:  Facility Placement                Permission sought to share information with:  Chartered certified accountant granted to share information::  Yes, Verbal Permission Granted  Name::        Agency::     Relationship::     Contact Information:     Housing/Transportation Living arrangements for the past 2 months:  Single Family Home Source of Information:  Patient, Adult Children Patient Interpreter Needed:  None Criminal Activity/Legal Involvement Pertinent to Current Situation/Hospitalization:    Significant Relationships:  Adult Children Lives with:  Adult Children Do you feel safe going back to the place where you live?  No Need for family participation in patient care:  Yes (Comment)  Care giving concerns:  CSW received consult for SNF placement.    Social Worker assessment / plan:  CSW met with patient & son, [name] at bedside re: discharge planning.   Employment status:    Forensic scientist:  Managed Care PT Recommendations:  [place] / Referral to community resources:  [place]  Patient/Family's Response to care:  Patient & son are agreeable with plan for SNF, requesting [facility] as it would be closest for them. CSW awaiting call back from [place] at [facility] re: bed availability.   Patient/Family's Understanding of and Emotional Response to Diagnosis, Current Treatment, and Prognosis:  Patient is hopeful that going to a SNF will help her get her strength back so she can go back home, is hoping for a short stay at SNF.   Emotional Assessment Appearance:  Appears stated age Attitude/Demeanor/Rapport:    Affect (typically observed):  Calm, Pleasant, Hopeful Orientation:  Oriented to Self, Oriented to Place, Oriented to   Time, Oriented to Situation Alcohol / Substance use:    Psych involvement (Current and /or in the community):  No (Comment)  Discharge Needs  Concerns to be addressed:    Readmission within the last 30 days:    Current discharge risk:    Barriers to Discharge:      Standley Brooking, LCSW 01/12/2015, 5:30 PM

## 2015-01-12 NOTE — Clinical Social Work Placement (Signed)
   CLINICAL SOCIAL WORK PLACEMENT  NOTE  Date:  01/12/2015  Patient Details  Name: Carol Bond MRN: [id number] Date of Birth: 19-Dec-1955  Clinical Social Work is seeking post-discharge placement for this patient at the [place] level of care (*CSW will initial, date and re-position this form in  chart as items are completed):  Yes   Patient/family provided with [place] Work Department's list of facilities offering this level of care within the geographic area requested by the patient (or if unable, by the patient's family).  Yes   Patient/family informed of their freedom to choose among providers that offer the needed level of care, that participate in Medicare, Medicaid or managed care program needed by the patient, have an available bed and are willing to accept the patient.  Yes   Patient/family informed of [place]'s ownership interest in [facility] and [facility], as well as of the fact that they are under no obligation to receive care at these facilities.  PASRR submitted to EDS on 01/12/15     PASRR number received on 01/12/15     Existing PASRR number confirmed on       FL2 transmitted to all facilities in geographic area requested by pt/family on 01/12/15     FL2 transmitted to all facilities within larger geographic area on       Patient informed that his/her managed care company has contracts with or will negotiate with certain facilities, including the following:        Yes   Patient/family informed of bed offers received.  Patient chooses bed at       Physician recommends and patient chooses bed at      Patient to be transferred to   on  .  Patient to be transferred to facility by       Patient family notified on   of transfer.  Name of family member notified:        PHYSICIAN       Additional Comment:    _______________________________________________ Standley Brooking, LCSW 01/12/2015, 5:32 PM

## 2015-01-13 ENCOUNTER — Encounter: Payer: Self-pay | Admitting: Internal Medicine

## 2015-01-13 ENCOUNTER — Encounter: Payer: Self-pay | Admitting: Medical Oncology

## 2015-01-13 LAB — GLUCOSE, CAPILLARY
GLUCOSE-CAPILLARY: 133 mg/dL — AB (ref 65–99)
GLUCOSE-CAPILLARY: 164 mg/dL — AB (ref 65–99)
Glucose-Capillary: 138 mg/dL — ABNORMAL HIGH (ref 65–99)
Glucose-Capillary: 154 mg/dL — ABNORMAL HIGH (ref 65–99)

## 2015-01-13 MED ORDER — ESCITALOPRAM OXALATE 10 MG PO TABS: 10.0000 mg | ORAL_TABLET | Freq: Every day | ORAL | Status: AC

## 2015-01-13 MED ORDER — OXYCODONE-ACETAMINOPHEN 5-325 MG PO TABS: 1.0000 | ORAL_TABLET | ORAL | Status: AC | PRN

## 2015-01-13 MED ORDER — LORAZEPAM 1 MG PO TABS: 0.5000 mg | ORAL_TABLET | Freq: Two times a day (BID) | ORAL | Status: AC | PRN

## 2015-01-13 MED ORDER — HEPARIN SOD (PORK) LOCK FLUSH 100 UNIT/ML IV SOLN
500.0000 [IU] | INTRAVENOUS | Status: AC | PRN
  Administered 2015-01-13: 500 [IU]

## 2015-01-13 MED ORDER — LEVOTHYROXINE SODIUM 50 MCG PO TABS: 50.0000 ug | ORAL_TABLET | Freq: Every day | ORAL | Status: AC

## 2015-01-13 NOTE — Clinical Social Work Placement (Signed)
Patient is set to discharge to [facility] today. Patient & son, [name] at bedside aware. Discharge packet given to RN, Apolonio Schneiders. Son to transport to SNF once done with lunch.    Raynaldo Opitz, [facility] Clinical Social Worker cell #: [phone]    CLINICAL SOCIAL WORK PLACEMENT  NOTE  Date:  01/13/2015  Patient Details  Name: Carol Bond MRN: [id number] Date of Birth: Jun 14, 1956  Clinical Social Work is seeking post-discharge placement for this patient at the [place] level of care (*CSW will initial, date and re-position this form in  chart as items are completed):  Yes   Patient/family provided with [place] Work Department's list of facilities offering this level of care within the geographic area requested by the patient (or if unable, by the patient's family).  Yes   Patient/family informed of their freedom to choose among providers that offer the needed level of care, that participate in Medicare, Medicaid or managed care program needed by the patient, have an available bed and are willing to accept the patient.  Yes   Patient/family informed of [place]'s ownership interest in [facility] and [facility], as well as of the fact that they are under no obligation to receive care at these facilities.  PASRR submitted to EDS on 01/12/15     PASRR number received on 01/12/15     Existing PASRR number confirmed on       FL2 transmitted to all facilities in geographic area requested by pt/family on 01/12/15     FL2 transmitted to all facilities within larger geographic area on       Patient informed that his/her managed care company has contracts with or will negotiate with certain facilities, including the following:        Yes   Patient/family informed of bed offers received.  Patient chooses bed at [place], [place]     Physician recommends and patient chooses bed at       Patient to be transferred to [place], [place] on 01/13/15.  Patient to be transferred to facility by son, Billy's car     Patient family notified on 01/13/15 of transfer.  Name of family member notified:  son, [name] at bedside     PHYSICIAN       Additional Comment:    _______________________________________________ Standley Brooking, LCSW 01/13/2015, 11:27 AM

## 2015-01-13 NOTE — Progress Notes (Signed)
Inpatient Diabetes Program Recommendations  AACE/ADA: New Consensus Statement on Inpatient Glycemic Control (2013)  Target Ranges:  Prepandial:   less than 140 mg/dL      Peak postprandial:   less than 180 mg/dL (1-2 hours)      Critically ill patients:  140 - 180 mg/dL    Results for CLYDENE, BURACK (MRN [id number]) as of 01/13/2015 09:35  Ref. Range 01/12/2015 05:22 01/12/2015 07:32 01/12/2015 12:38 01/12/2015 16:29 01/12/2015 19:43 01/12/2015 23:46 01/13/2015 03:22 01/13/2015 07:42  Glucose-Capillary Latest Ref Range: 65-99 mg/dL 214 (H) 246 (H) 319 (H) 208 (H) 204 (H) 154 (H) 133 (H) 138 (H)   Post-prandial blood sugars elevated.  Inpatient Diabetes Program Recommendations Correction (SSI): Add Novolog sensitive tidwc if deemed appropriate  Thank you. Lorenda Peck, RD, LDN, CDE Inpatient Diabetes Coordinator [phone]

## 2015-01-13 NOTE — Discharge Summary (Signed)
Physician Discharge Summary  Carol Bond LHT:[id number] DOB: [birth year] DOA: 01/09/2015  PCP: [name], MD  Admit date: 01/09/2015 Discharge date: 01/13/2015  Recommendations for Outpatient Follow-up:  1. Check CBC and BMP for skilled nursing facility protocol. 2. Follow-up with primary care physician per scheduled appointment. 3. Please note we have decreased the Synthroid from 150 g daily down to 50 g daily because low TSH on this admission. Patient will require TSH recheck in about 1-2 months after this discharge and adjust Synthroid dose accordingly.    Discharge Diagnoses:  Principal Problem:   Acute delirium Active Problems:   Small cell lung cancer   Weakness generalized   UTI (urinary tract infection)   Hypoglycemia   Observed seizure-like activity    Discharge Condition: stable   Diet recommendation: as tolerated   History of present illness:  59 y.o. 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 She presented to [facility] ED status post fall and worsening mental status changes. Per her daughter she had rapid eye movement and she thought her mother had seizures. She was recently seen in Oncology office and UA and subsequent urine culture (01/05/15) revealed UTI due to pseudomonas. She was given Cipro for the UTI. When EMS arrived pt was found to have hypoglycemia which has improved with D50. In ED, work up revealed UTI. Patient received Rocephin discharge through 01/12/2015. Because urine culture shows no growth antibiotics stopped 01/12/2015  Hospital Course:  Assessment/Plan:    Principal Problem: Acute metabolic encephalopathy / Left arm and leg weakness - Secondary to UTI and possibly already known brain metastases -Mental status is much better this morning. Patient is alert and awake. She does have intermittent episodes of confusion. - She did have questionable left arm and leg  weakness but she is spontaneously moving the extremities over past 24-48 hours. - EEG did not show epileptiform activity. - CT scan did not reveal acute intracranial findings - Per physical therapy recommendation, recommendation is for skilled nursing facility placement and family is agreeable to that.   Active Problems: UTI / Leukopenia - UA with many bacteria, no leukocytes - Urine culture on this admission shows no growth but on 5/9 (urine culture obtained in cancer center did grow pseudomonas). Patient did receive ciprofloxacin at that time. She was started on Rocephin on the admission. - Rocephin stopped 01/12/2015 because urine culture showed no growth.  Brain metastases / Possible seizures  - Patient was on Solu-Medrol IV since the admission. No evidence of seizures on EEG. No reports of seizures during hospital stay.  - Discontinue Solu-Medrol at the time of discharge.   Small cell lung cancer - Management per oncology - Continue temodar   Left pleural effusion, probably malignant - Left pleural effusion seen on CXR likely malignant - Resp status stable.  Acute renal failure - Secondary to UTI - Resolved with IV fluids   Hyponatremia - Possibly from UTI versus SIADH from malignancy - Outpatient monitoring of sodium level.   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   Hypothyroidism - TSH on this admission 0.08 - Synthroid was placed on hold in hospital. - Patient is on 150 g daily Synthroid at home. We will reduce it to 50 g daily. As noted above patient will need TSH level checked in about 1-2 months after discharge.  Generalized weakness / Fall - Patient was seen by physical  therapy recommendation is for skilled nursing facility placement and family is agreeable to this.   DVT Prophylaxis  - SCD's bilaterally    Code Status: DNR/DNI Family Communication: plan of care  discussed with the patient's husband and daughter    IV access:  Peripheral IV  Procedures and diagnostic studies:   Dg Chest 2 View 01/09/2015 1. Persistent left pleural effusion, likely malignant. 2. Nodular disease at the left base and along the cardiac border compatible with known metastases. 3. Scarring or tumor in the left upper lobe is stable. 4. No acute abnormality or significant interval change. Electronically Signed By: San Morelle M.D. On: 01/09/2015 08:03   Ct Head Wo Contrast 01/09/2015 Extensive periventricular decreased attenuation, probably enlarged part due to post radiation therapy change. There may be underlying small vessel disease. On this noncontrast enhanced study, there is no mass or edema. No hemorrhage. No acute appearing infarct. No extra-axial fluid collections. There is bilateral mastoid air cell disease which may in part be due to radiation therapy change.   Medical Consultants:  None   Other Consultants:  Physical therapy  Social work  IAnti-Infectives:   Rocephin 01/09/2015 --> 01/12/2015.  Signed:  Leisa Lenz, MD  [facility] 01/13/2015, 10:38 AM  Pager #: [phone]  Time spent in minutes: more than 30 minutes    Discharge Exam: Filed Vitals:   01/13/15 0700  BP: 132/74  Pulse: 59  Temp: 98 F (36.7 C)  Resp: 16   Filed Vitals:   01/12/15 0536 01/12/15 1340 01/12/15 1946 01/13/15 0700  BP: 133/68 138/81 150/80 132/74  Pulse: 65 72 66 59  Temp: 98.2 F (36.8 C) 97.7 F (36.5 C) 97.6 F (36.4 C) 98 F (36.7 C)  TempSrc: Oral Oral Oral Oral  Resp: '18 16 16 16  '$ Height:      Weight:      SpO2: 99% 99% 98% 99%    General: Pt is alert, follows commands appropriately, not in acute distress Cardiovascular: Regular rate and rhythm, S1/S2 +, no murmurs Respiratory: Clear to auscultation bilaterally, no wheezing, no crackles, no rhonchi Abdominal: Soft, non tender, non distended, bowel sounds  +, no guarding Extremities: no edema, no cyanosis, pulses palpable bilaterally DP and PT Neuro: Grossly nonfocal  Discharge Instructions  Discharge Instructions    Call MD for:  difficulty breathing, headache or visual disturbances    Complete by:  As directed      Call MD for:  persistant nausea and vomiting    Complete by:  As directed      Call MD for:  severe uncontrolled pain    Complete by:  As directed      Diet - low sodium heart healthy    Complete by:  As directed      Increase activity slowly    Complete by:  As directed             Medication List    STOP taking these medications        CIPRO 500 MG tablet  Generic drug:  ciprofloxacin     sulfamethoxazole-trimethoprim 800-160 MG per tablet  Commonly known as:  BACTRIM DS,SEPTRA DS     traZODone 25 mg Tabs tablet  Commonly known as:  DESYREL      TAKE these medications        albuterol 108 (90 BASE) MCG/ACT inhaler  Commonly known as:  PROVENTIL HFA;VENTOLIN HFA  Inhale 2 puffs into the lungs every 6 (six) hours as needed  for wheezing or shortness of breath.     escitalopram 10 MG tablet  Commonly known as:  LEXAPRO  Take 1 tablet (10 mg total) by mouth at bedtime.     fenofibrate 160 MG tablet  Take 160 mg by mouth at bedtime.     FREESTYLE LITE TEST VI  1 each as directed.     glimepiride 4 MG tablet  Commonly known as:  AMARYL  Take 4 mg by mouth daily with breakfast.     INVOKANA 300 MG Tabs tablet  Generic drug:  canagliflozin  Take 1 tablet by mouth daily before breakfast.     levothyroxine 50 MCG tablet  Commonly known as:  SYNTHROID, LEVOTHROID  Take 1 tablet (50 mcg total) by mouth daily before breakfast.     LORazepam 1 MG tablet  Commonly known as:  ATIVAN  Take 0.5-1 tablets (0.5-1 mg total) by mouth 2 (two) times daily as needed for anxiety.     losartan 50 MG tablet  Commonly known as:  COZAAR  Take 50 mg by mouth at bedtime.     naproxen sodium 220 MG tablet  Commonly  known as:  ANAPROX  Take 440 mg by mouth daily as needed (pain).     ondansetron 8 MG tablet  Commonly known as:  ZOFRAN  Take 8 mg by mouth every 8 (eight) hours as needed for nausea or vomiting.     oxyCODONE-acetaminophen 5-325 MG per tablet  Commonly known as:  PERCOCET  Take 1-2 tablets by mouth every 4 (four) hours as needed for moderate pain or severe pain.     sitaGLIPtin-metformin 50-1000 MG per tablet  Commonly known as:  JANUMET  Take 1 tablet by mouth 2 (two) times daily with a meal.     temozolomide 180 MG capsule  Commonly known as:  TEMODAR  Take 2 capsules (360 mg total) by mouth daily. May take on an empty stomach or at bedtime to decrease nausea & vomiting.     vitamin B-12 100 MCG tablet  Commonly known as:  CYANOCOBALAMIN  Take 100 mcg by mouth daily.           Follow-up Information    Follow up with [name], MD. Schedule an appointment as soon as possible for a visit in 1 week.   Specialty:  Family Medicine   Why:  Follow up appt after recent hospitalization   Contact information:   [address] [place] [address] [phone]        The results of significant diagnostics from this hospitalization (including imaging, microbiology, ancillary and laboratory) are listed below for reference.    Significant Diagnostic Studies: Dg Chest 2 View  01/09/2015   CLINICAL DATA:  Recent falls. Altered mental status. Lung cancer with progressive confusion and weakness.  EXAM: CHEST - 2 VIEW  COMPARISON:  CT chest 01/01/2015  FINDINGS: The heart size is normal. A left subclavian Port-A-Cath is stable in position. A left pleural effusion persists. There is some volume loss on the left. A left basilar density likely reflects the known pleural-based metastases focal nodularity along the cardiac border also represents metastases. Scarring or tumor is again noted in the left upper lobe. The right lung is clear.  IMPRESSION: 1. Persistent  left pleural effusion, likely malignant. 2. Nodular disease at the left base and along the cardiac border compatible with known metastases. 3. Scarring or tumor in the left upper lobe is stable. 4. No acute abnormality  or significant interval change.   Electronically Signed   By: San Morelle M.D.   On: 01/09/2015 08:03   Ct Head Wo Contrast  01/09/2015   CLINICAL DATA:  Patient fell 1 day prior. Lung carcinoma with history of radiation therapy to the brain.  EXAM: CT HEAD WITHOUT CONTRAST  TECHNIQUE: Contiguous axial images were obtained from the base of the skull through the vertex without intravenous contrast.  COMPARISON:  Head CT August 12, 2014 and brain MRI December 23, 2014  FINDINGS: The ventricles are normal in size and configuration. There is currently no demonstrable mass or edema. No hemorrhage, extra-axial fluid collection, or midline shift is appreciable. A known varix in the inferior right occipital lobe seen on MR is not appreciable on this noncontrast enhanced study. There is widespread decreased attenuation throughout the centra semiovale bilaterally which may be in large part due to previous radiation therapy but may also be in part due to small vessel disease. No acute appearing infarct is seen on this study. The bony calvarium appears intact. There is opacification of multiple mastoid air cells bilaterally, stable compared to recent MR.  IMPRESSION: Extensive periventricular decreased attenuation, probably enlarged part due to post radiation therapy change. There may be underlying small vessel disease. On this noncontrast enhanced study, there is no mass or edema. No hemorrhage. No acute appearing infarct. No extra-axial fluid collections. There is bilateral mastoid air cell disease which may in part be due to radiation therapy change.   Electronically Signed   By: Lowella Grip III M.D.   On: 01/09/2015 08:19   Ct Chest W Contrast  01/01/2015   CLINICAL DATA:  Small cell lung  cancer with metastatic disease to the brain.  EXAM: CT CHEST, ABDOMEN, AND PELVIS WITH CONTRAST  TECHNIQUE: Multidetector CT imaging of the chest, abdomen and pelvis was performed following the standard protocol during bolus administration of intravenous contrast.  CONTRAST:  129m OMNIPAQUE IOHEXOL 300 MG/ML  SOLN  COMPARISON:  Multiple exams, including 10/20/2014  FINDINGS: CT CHEST FINDINGS  Mediastinum/Nodes: Upper mediastinal node 1.4 cm in short axis image 11 series 2, formerly 1.8 cm by my measurement. Left mediastinal/hilar mass, 5.0 by 3.3 cm, formerly 5.8 by 3.5 cm, obstructing anterior left upper lobe pulmonary arterial branch and with presumably extrinsic narrowing of the left lower lobe pulmonary artery. Lower paratracheal node short axis diameter 0.7 cm on image 22 series 2, formerly 0.9 cm.  Lungs/Pleura: Pleural metastatic disease noted on the left with several notable pleural nodules. Along the left heart border, a pleural mass measures 1.3 cm in thickness, previously 1.4 cm. Trace left pleural effusion. Scarring and volume loss in the left lung similar to prior. Small peripheral nodules in the left lower lobe appear mildly reduced in size, for example a prior 5 mm nodule currently measures 4 mm in diameter.  Musculoskeletal: Unremarkable  CT ABDOMEN PELVIS FINDINGS  Hepatobiliary: 1.3 cm enhancing lesion in segment 2 of the liver, image 43 series 2.  Pancreas: Unremarkable  Spleen: Unremarkable  Adrenals/Urinary Tract: Left adrenal mass 2.2 by 1.3 cm on image 55 series 2, formerly 3.2 by 2.7 cm.  Stomach/Bowel: Unremarkable  Vascular/Lymphatic: Aortoiliac atherosclerotic vascular disease.  Reproductive: Uterus absent. 1.9 by 1.3 cm dense lesion along the anterior margin of the vagina, essentially stable, not hypermetabolic on prior PET-CT.  Other: No supplemental non-categorized findings.  Musculoskeletal: Mild lower lumbar spondylosis and degenerative disc disease.  IMPRESSION: 1. Improved  dominant left hilar mass and approved adenopathy  and pleural metastatic disease in the chest. The left adrenal mass is also significantly improved. 2. 1.3 cm enhancing lesion in segment 2 of the liver, no change, not previously hypermetabolic, possible hemangioma. 3. Dense lesion along the anterior vaginal margin, query Skene duct cyst or Gartner duct cyst, not previously hypermetabolic.   Electronically Signed   By: Van Clines M.D.   On: 01/01/2015 12:34   Mr Jeri Cos HY Contrast  12/23/2014   CLINICAL DATA:  59 year old female with numerous recurrent brain metastases following previous whole brain radiation and salvage stereotactic 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  From 10/13/2014-3/1/20165 the whole brain received 30 Gy in 12 fractions of 2.5 Gy.  Restaging.  Subsequent encounter.  EXAM: MRI HEAD WITHOUT AND WITH CONTRAST  TECHNIQUE: Multiplanar, multiecho pulse sequences of the brain and surrounding structures were obtained without and with intravenous contrast.  CONTRAST:  3m MULTIHANCE GADOBENATE DIMEGLUMINE 529 MG/ML IV SOLN  COMPARISON:  Brain MRI 10/03/2014 and earlier.  FINDINGS: All of the small enhancing brain metastases identified in February have regressed. A number of these are now only faintly visible following contrast.  No new metastasis identified. 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  Suggestion of a small nonenhancing lacunar infarct in the left hemisphere white matter on series 4, image 31. No other restricted diffusion or evidence of acute infarction. Major intracranial vascular flow voids are stable.  No ventriculomegaly. No intracranial mass effect. No acute intracranial hemorrhage identified. Some small previously identified areas of metastasis related edema have resolved. Underlying Confluent white matter T2 and FLAIR hyperintensity have not significantly changed. Negative pituitary and cervicomedullary junction. Grossly  negative visualized cervical spinal cord.  Visible bone marrow signal is stable and within normal limits. Visualized orbit soft tissues are within normal limits. Paranasal sinuses remain clear. There are new bilateral mastoid effusions, greater on the left. Negative visualized pharynx. Other internal auditory structures appear normal. Visualized scalp soft tissues are within normal limits.  IMPRESSION: 1. All previously identified small brain metastases have regressed following repeat whole brain radiation, many now barely visible. No new metastasis identified. 2. New mastoid effusions likely post treatment related.   Electronically Signed   By: HGenevie AnnM.D.   On: 12/23/2014 16:53   Ct Abdomen Pelvis W Contrast  01/01/2015   CLINICAL DATA:  Small cell lung cancer with metastatic disease to the brain.  EXAM: CT CHEST, ABDOMEN, AND PELVIS WITH CONTRAST  TECHNIQUE: Multidetector CT imaging of the chest, abdomen and pelvis was performed following the standard protocol during bolus administration of intravenous contrast.  CONTRAST:  1075mOMNIPAQUE IOHEXOL 300 MG/ML  SOLN  COMPARISON:  Multiple exams, including 10/20/2014  FINDINGS: CT CHEST FINDINGS  Mediastinum/Nodes: Upper mediastinal node 1.4 cm in short axis image 11 series 2, formerly 1.8 cm by my measurement. Left mediastinal/hilar mass, 5.0 by 3.3 cm, formerly 5.8 by 3.5 cm, obstructing anterior left upper lobe pulmonary arterial branch and with presumably extrinsic narrowing of the left lower lobe pulmonary artery. Lower paratracheal node short axis diameter 0.7 cm on image 22 series 2, formerly 0.9 cm.  Lungs/Pleura: Pleural metastatic disease noted on the left with several notable pleural nodules. Along the left heart border, a pleural mass measures 1.3 cm in thickness, previously 1.4 cm. Trace left pleural effusion. Scarring and volume loss in the left lung similar to prior. Small peripheral nodules in the left lower lobe appear mildly reduced in size, for  example a prior 5  mm nodule currently measures 4 mm in diameter.  Musculoskeletal: Unremarkable  CT ABDOMEN PELVIS FINDINGS  Hepatobiliary: 1.3 cm enhancing lesion in segment 2 of the liver, image 43 series 2.  Pancreas: Unremarkable  Spleen: Unremarkable  Adrenals/Urinary Tract: Left adrenal mass 2.2 by 1.3 cm on image 55 series 2, formerly 3.2 by 2.7 cm.  Stomach/Bowel: Unremarkable  Vascular/Lymphatic: Aortoiliac atherosclerotic vascular disease.  Reproductive: Uterus absent. 1.9 by 1.3 cm dense lesion along the anterior margin of the vagina, essentially stable, not hypermetabolic on prior PET-CT.  Other: No supplemental non-categorized findings.  Musculoskeletal: Mild lower lumbar spondylosis and degenerative disc disease.  IMPRESSION: 1. Improved dominant left hilar mass and approved adenopathy and pleural metastatic disease in the chest. The left adrenal mass is also significantly improved. 2. 1.3 cm enhancing lesion in segment 2 of the liver, no change, not previously hypermetabolic, possible hemangioma. 3. Dense lesion along the anterior vaginal margin, query Skene duct cyst or Gartner duct cyst, not previously hypermetabolic.   Electronically Signed   By: Van Clines M.D.   On: 01/01/2015 12:34    Microbiology: Recent Results (from the past 240 hour(s))  Urine culture     Status: None   Collection Time: 01/05/15 10:08 AM  Result Value Ref Range Status   Urine Culture, Routine Culture, Urine  Final    Comment: Final - ===== COLONY COUNT: ===== >=100,000 COLONIES/ML PSEUDOMONAS AERUGINOSA  ------------------------------------------------------------------------  PSEUDOMONAS AERUGINOSA     PIPERACILLIN/TAZO                MIC      Sensitive        <=4 ug/ml    IMIPENEM                         MIC      Sensitive          2 ug/ml    CEFTAZIDIME                      MIC      Sensitive          4 ug/ml    CEFEPIME                         MIC      Sensitive          4 ug/ml    GENTAMICIN                        MIC      Sensitive        <=1 ug/ml    TOBRAMYCIN                       MIC      Sensitive        <=1 ug/ml    CIPROFLOXACIN                    MIC      Sensitive     <=0.25 ug/ml    LEVOFLOXACIN                     MIC      Sensitive          1 ug/ml END OF REPORT   Urine culture     Status: None   Collection Time: 01/09/15  9:35 AM  Result Value Ref Range Status   Specimen Description URINE, CATHETERIZED  Final   Special Requests NONE  Final   Colony Count NO GROWTH Performed at [facility]   Final   Culture NO GROWTH Performed at [facility]   Final   Report Status 01/10/2015 FINAL  Final     Labs: Basic Metabolic Panel:  Recent Labs Lab 01/09/15 0853 01/09/15 0900 01/10/15 0523 01/12/15 1352  NA 138 139 128* 125*  K 3.7 3.8 4.3 3.6  CL 102 104 95* 90*  CO2 25  --  26 23  GLUCOSE 28* 28* 151* 317*  BUN 24* 23* 10 16  CREATININE 1.10* 1.00 0.87 0.66  CALCIUM 9.9  --  9.8 9.1   Liver Function Tests:  Recent Labs Lab 01/09/15 0853  AST 18  ALT 17  ALKPHOS 35*  BILITOT 0.5  PROT 6.9  ALBUMIN 4.4   No results for input(s): LIPASE, AMYLASE in the last 168 hours. No results for input(s): AMMONIA in the last 168 hours. CBC:  Recent Labs Lab 01/09/15 0853 01/09/15 0900 01/10/15 0523  WBC 4.1  --  2.9*  NEUTROABS 3.3  --   --   HGB 11.1* 11.6* 11.4*  HCT 32.6* 34.0* 34.1*  MCV 95.6  --  95.8  PLT 133*  --  128*   Cardiac Enzymes: No results for input(s): CKTOTAL, CKMB, CKMBINDEX, TROPONINI in the last 168 hours. BNP: BNP (last 3 results) No results for input(s): BNP in the last 8760 hours.  ProBNP (last 3 results)  Recent Labs  07/11/14 0840  PROBNP 40.6    CBG:  Recent Labs Lab 01/12/15 1629 01/12/15 1943 01/12/15 2346 01/13/15 0322 01/13/15 0742  GLUCAP 208* 204* 154* 133* 138*

## 2015-01-13 NOTE — Discharge Instructions (Signed)

## 2015-01-13 NOTE — Progress Notes (Signed)
[facility] physicians statement returned to [place].

## 2015-01-13 NOTE — Progress Notes (Signed)
I faxed the [facility] the forms  [phone]

## 2015-01-13 NOTE — Progress Notes (Signed)
I placed copy of form in mail to the patient for her to have for her records

## 2015-01-19 ENCOUNTER — Other Ambulatory Visit: Payer: Self-pay | Admitting: *Deleted

## 2015-01-19 ENCOUNTER — Telehealth: Payer: Self-pay | Admitting: *Deleted

## 2015-01-19 DIAGNOSIS — C3412 Malignant neoplasm of upper lobe, left bronchus or lung: Secondary | ICD-10-CM

## 2015-01-19 NOTE — Telephone Encounter (Signed)
VM from pt's daughter Mendel Ryder, states " My family and I have decided to stop moms chemo and go ahead and have her placed on hospice." Forward message to MD for review Referral sent to hospice. Called [facility], referral made, pt currently in [place] home, hospice will call [facility] for additional order.

## 2015-01-30 ENCOUNTER — Telehealth: Payer: Self-pay | Admitting: Medical Oncology

## 2015-01-30 NOTE — Telephone Encounter (Signed)
I returned Carol Bond"s call -family has called in hospice when pt comes home from  and want to cancel all further appts. Onc tx request sent.

## 2015-02-02 ENCOUNTER — Telehealth: Payer: Self-pay | Admitting: *Deleted

## 2015-02-02 NOTE — Telephone Encounter (Signed)
Hospice RN called with update, pt will be discharged from [facility] on Wednesday. Hospice will take over pt's care at that point,

## 2015-02-03 ENCOUNTER — Other Ambulatory Visit: Payer: 59

## 2015-02-03 ENCOUNTER — Ambulatory Visit: Payer: 59 | Admitting: Internal Medicine

## 2015-02-05 ENCOUNTER — Telehealth: Payer: Self-pay | Admitting: *Deleted

## 2015-02-05 NOTE — Telephone Encounter (Signed)
TC from Newman Nickels, RN from [facility] and [facility]. Pt is being admitted to hospice services today. Pam states patient believes she is getting UTI. Pam would like order for U/a and culture. Also patient is on Decadron 2 mg every 6 hours.  Can pt be on Decadron 4 mg BID?  I do not see Decadron on pt's med list. Or on hospital discharge. Please advise.

## 2015-02-05 NOTE — Telephone Encounter (Signed)
Decadron 2 mg by mouth twice a day is okay

## 2015-02-06 ENCOUNTER — Telehealth: Payer: Self-pay | Admitting: *Deleted

## 2015-02-06 ENCOUNTER — Other Ambulatory Visit (HOSPITAL_BASED_OUTPATIENT_CLINIC_OR_DEPARTMENT_OTHER): Payer: 59 | Admitting: *Deleted

## 2015-02-06 ENCOUNTER — Ambulatory Visit: Payer: 59

## 2015-02-06 ENCOUNTER — Other Ambulatory Visit: Payer: Self-pay | Admitting: *Deleted

## 2015-02-06 DIAGNOSIS — C7931 Secondary malignant neoplasm of brain: Secondary | ICD-10-CM | POA: Diagnosis not present

## 2015-02-06 DIAGNOSIS — N390 Urinary tract infection, site not specified: Secondary | ICD-10-CM

## 2015-02-06 DIAGNOSIS — C3492 Malignant neoplasm of unspecified part of left bronchus or lung: Secondary | ICD-10-CM

## 2015-02-06 LAB — URINALYSIS, MICROSCOPIC - CHCC
BILIRUBIN (URINE): NEGATIVE
Blood: NEGATIVE
Glucose: 2000 mg/dL
Ketones: NEGATIVE mg/dL
Leukocyte Esterase: NEGATIVE
Nitrite: NEGATIVE
PH: 6 (ref 4.6–8.0)
Protein: NEGATIVE mg/dL
RBC / HPF: NEGATIVE (ref 0–2)
Specific Gravity, Urine: 1.02 (ref 1.003–1.035)
Urobilinogen, UR: 0.2 mg/dL (ref 0.2–1)

## 2015-02-06 NOTE — Telephone Encounter (Signed)
VM message from Hall Busing, RN with [facility]. Requesting refill on Fentanyl Patch 25 mcg every 72 hours and also pt has old trazadone prescription that helped her rest at night and would like refill on that.   Call back to [facility] for Hospice doctor to do symptom management and fill prescription for fentanyl/trazadone. Dr. Julien Nordmann not in office this afternoon. Helene Kelp will contact Dr. Tomasa Hosteller for meds.

## 2015-02-06 NOTE — Telephone Encounter (Signed)
VM message left for Newman Nickels, RN with [facility] for decadron order - '2mg'$  po BID.

## 2015-02-07 LAB — URINE CULTURE

## 2015-02-13 ENCOUNTER — Telehealth: Payer: Self-pay

## 2015-02-13 NOTE — Telephone Encounter (Signed)
Carol Bond from hospice called with update:  checked on UA/culture results done on 10th (essentially negative). She has had a fall on Monday night/tues morning, and cut her lip, she hit her shoulder and elbow, R elbow was bruised and mildly swollen. Husband did not want to take her to the hospital. Pt started on MS contin 15 bid per hospice MD, he dc'd fentanyl patch.

## 2015-02-17 ENCOUNTER — Telehealth: Payer: Self-pay | Admitting: Radiation Oncology

## 2015-02-17 NOTE — Telephone Encounter (Signed)
Returned message left by Max Sane, RN for Hospice. She is requesting Dr. Tammi Klippel refill the patient's trazodone. She verbalizes the patient is taking 1/2 tab per day. She confirms the patient uses [facility] on [address]. Explained this request would forwarded to Dr. Tammi Klippel. She verbalized understanding and expressed appreciation for the return call.

## 2015-02-18 ENCOUNTER — Telehealth: Payer: Self-pay | Admitting: Radiation Oncology

## 2015-02-18 ENCOUNTER — Other Ambulatory Visit: Payer: Self-pay | Admitting: Radiation Oncology

## 2015-02-18 DIAGNOSIS — C7931 Secondary malignant neoplasm of brain: Secondary | ICD-10-CM

## 2015-02-18 MED ORDER — TRAZODONE HCL 50 MG PO TABS: 25.0000 mg | ORAL_TABLET | Freq: Every day | ORAL | Status: AC

## 2015-02-18 NOTE — Telephone Encounter (Signed)
Refill sent.

## 2015-02-18 NOTE — Telephone Encounter (Signed)
Phoned patient's home. Spoke with [employer]. Explained the medication the patient requested a refill for has been E-scribed. Blondie reports she will inform the patient and her daughters of such.

## 2015-02-23 ENCOUNTER — Other Ambulatory Visit: Payer: Self-pay

## 2015-02-24 ENCOUNTER — Other Ambulatory Visit: Payer: Self-pay | Admitting: *Deleted

## 2015-02-24 MED ORDER — DEXAMETHASONE 4 MG PO TABS: 2.0000 mg | ORAL_TABLET | Freq: Two times a day (BID) | ORAL | Status: AC

## 2015-02-24 NOTE — Telephone Encounter (Signed)
VM message from hospice RN requesting refill on pt's dexamethasone.  Escribed to pt's pharmacy

## 2015-03-16 ENCOUNTER — Telehealth: Payer: Self-pay | Admitting: *Deleted

## 2015-03-16 NOTE — Telephone Encounter (Signed)
Sherri RN with Hospice called reporting [name] has reached end of life.  She is being transferred to [place] for [facility] Life Care.

## 2015-03-23 ENCOUNTER — Encounter: Payer: Self-pay | Admitting: Radiation Therapy

## 2015-03-23 NOTE — Progress Notes (Signed)
Snip from [place]:   Ovie Pop[place], 60, went home to be with her Lord March 21, 2015.

## 2015-03-24 ENCOUNTER — Telehealth: Payer: Self-pay | Admitting: Internal Medicine

## 2015-03-24 NOTE — Telephone Encounter (Signed)
Received death certificate [birth year]

## 2015-03-30 DEATH — deceased

## 2015-04-03 ENCOUNTER — Other Ambulatory Visit: Payer: 59

## 2015-04-06 ENCOUNTER — Inpatient Hospital Stay: Admission: RE | Admit: 2015-04-06 | Payer: Self-pay | Source: Ambulatory Visit | Admitting: Radiation Oncology

## 2015-04-06 ENCOUNTER — Ambulatory Visit: Payer: Self-pay | Admitting: Radiation Oncology

## 2015-04-20 IMAGING — CT NM PET TUM IMG INITIAL (PI) SKULL BASE T - THIGH
7 series · 25 of 25 positions shown · non-contrast
Comparison: CTA chest dated 12/17/2013

CLINICAL DATA: Initial treatment strategy for lung cancer.

EXAM:
NUCLEAR MEDICINE PET SKULL BASE TO THIGH
TECHNIQUE: 9.3 mCi F-18 FDG was injected intravenously. Full-ring PET imaging
was performed from the skull base to thigh after the radiotracer. CT
data was obtained and used for attenuation correction and anatomic
localization.
FASTING BLOOD GLUCOSE:  Value: 156 mg/dl

[Series 3: pet sk_thigh ac · axial · 5.0mm · 4.07mm/px · z∈[-1024,-156]mm · 5 of 218 slices shown]
[im 1/218]
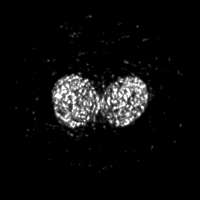
[im 55/218]
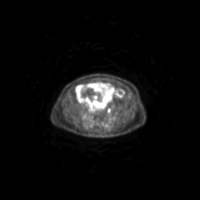
[im 109/218]
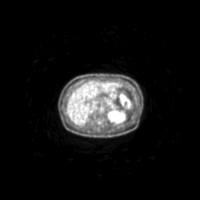
[im 163/218]
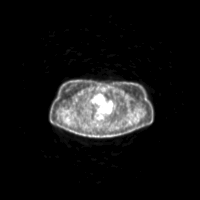
[im 218/218]
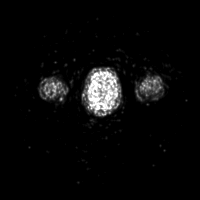

[Series 4: ct sk_thigh 5.0 b31f · axial · 5.0mm · 0.98mm/px · z∈[-1024,-156]mm · 5 of 218 slices shown]
[im 1/218]
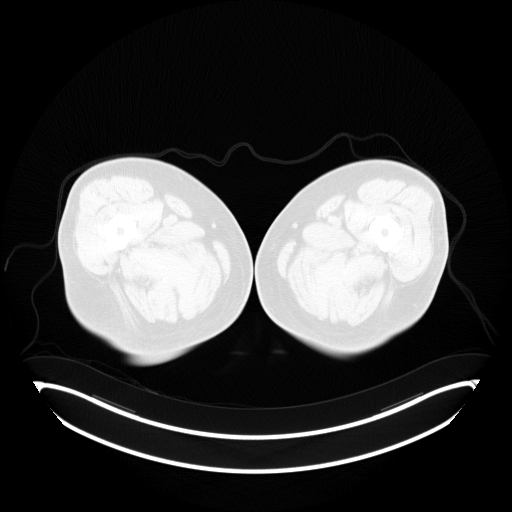
[im 55/218]
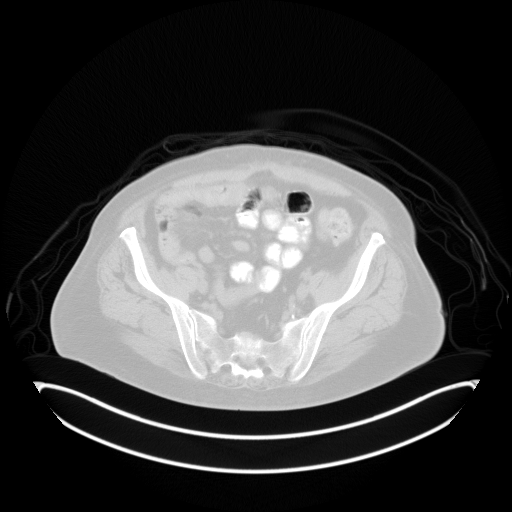
[im 109/218]
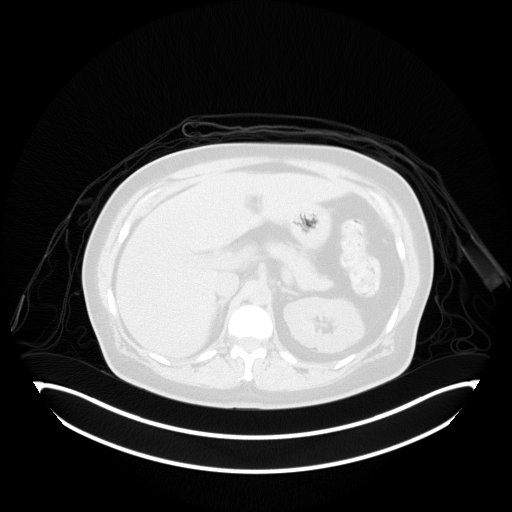
[im 163/218]
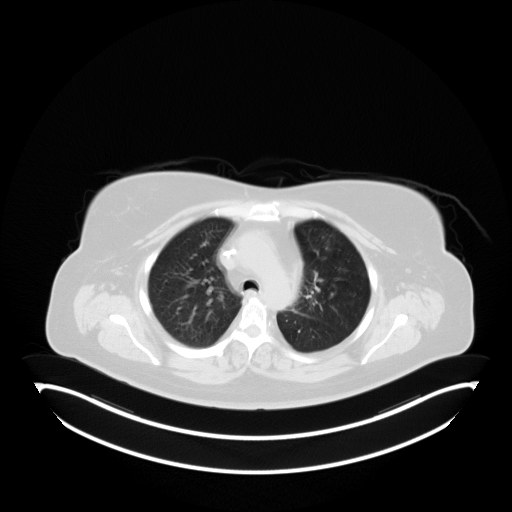
[im 218/218  brain]
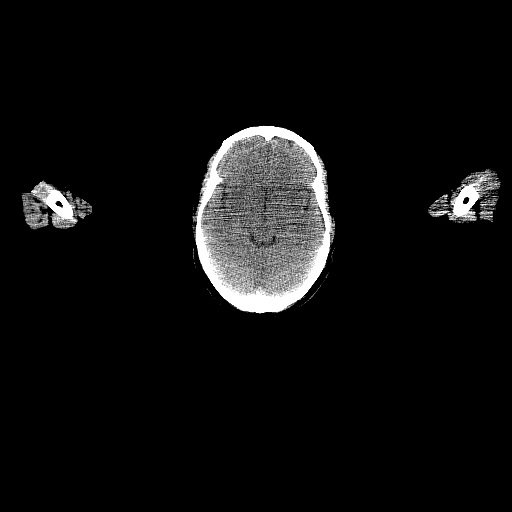

[Series 7: pet sk_thigh nac · axial · 5.0mm · 4.07mm/px · z∈[-1024,-156]mm · 5 of 218 slices shown]
[im 1/218]
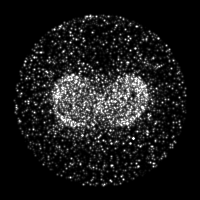
[im 55/218]
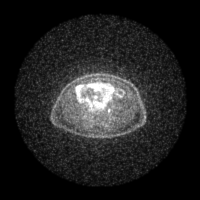
[im 109/218]
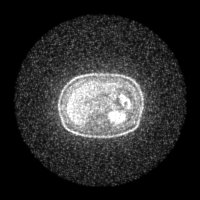
[im 163/218]
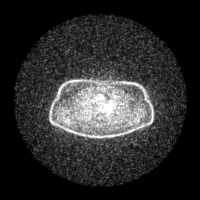
[im 218/218]
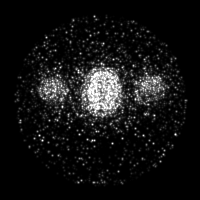

[Series 8: ct sk_thigh 5.0 b70f (id)_bone · axial · 5.0mm · 0.60mm/px · z∈[-570,-290]mm · 2 of 71 slices shown]
[im 1/71  bone]
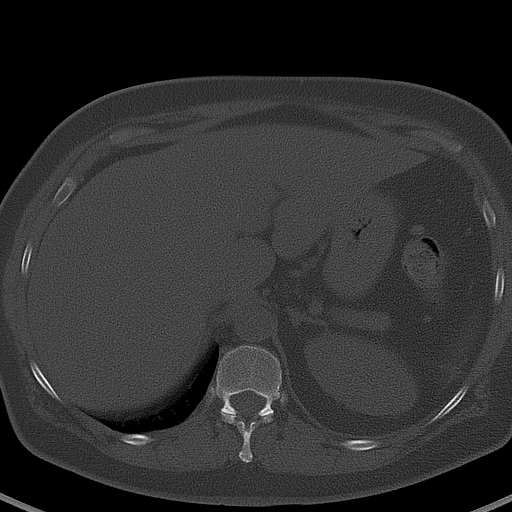
[im 71/71  bone]
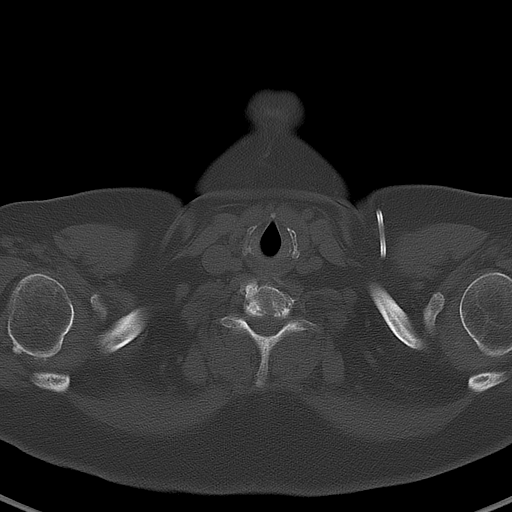

[Series 604: mip collection<mip range> · coronal · 1.80mm/px · 1 of 32 slices shown]
[im 1/32]
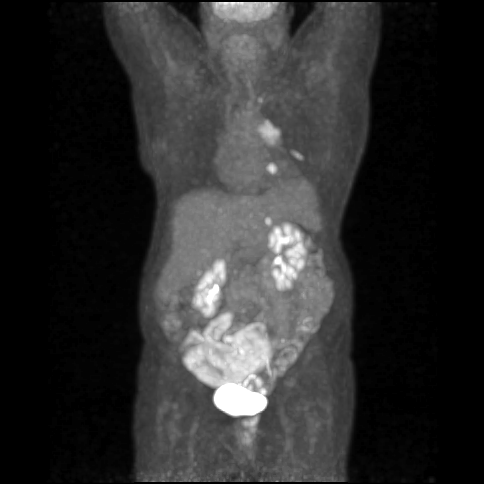

[Series 605: range-ct sk_thigh 5.0 (id)<alpha range> · 2 of 86 slices shown (1 of 2)]
[im 1/86]
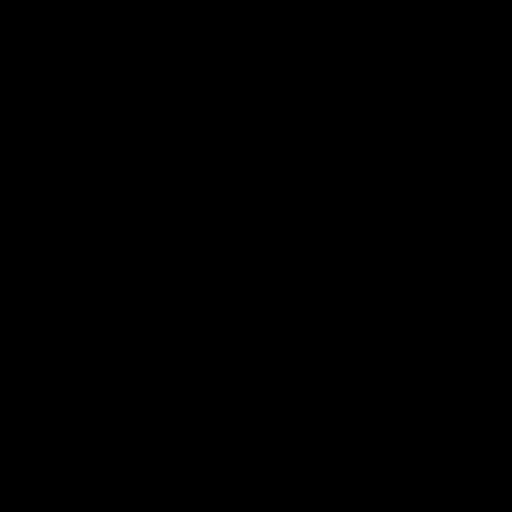
[im 86/86]
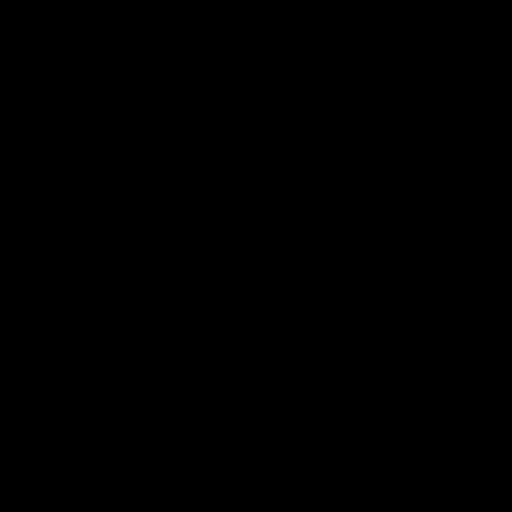

[Series 606: range-ct sk_thigh 5.0 (id)<alpha range> · 5 of 212 slices shown (2 of 2)]
[im 1/212]
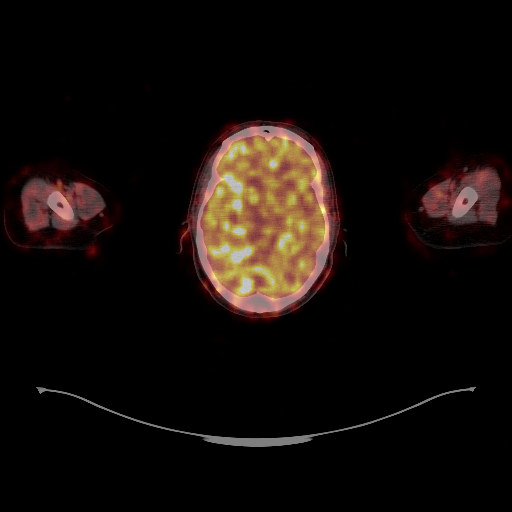
[im 53/212]
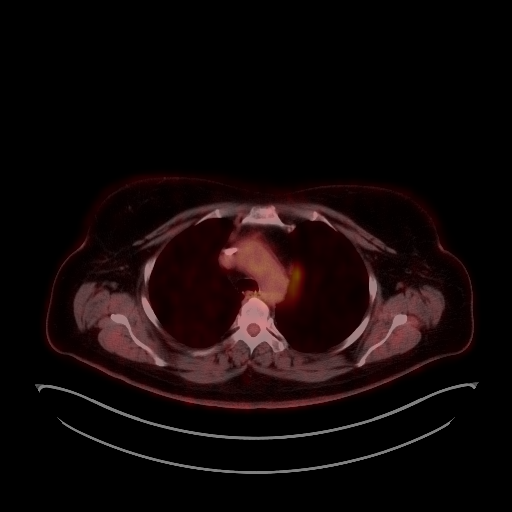
[im 106/212]
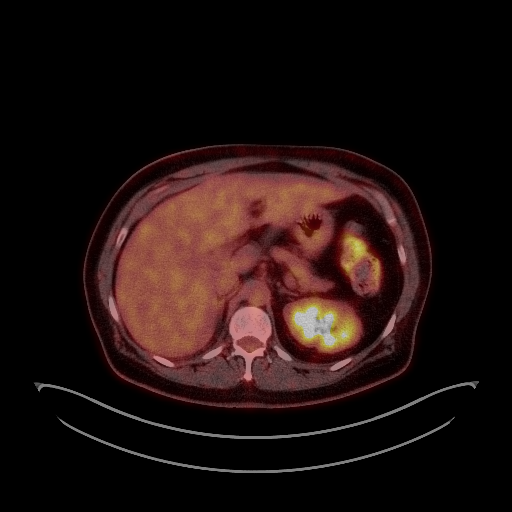
[im 159/212]
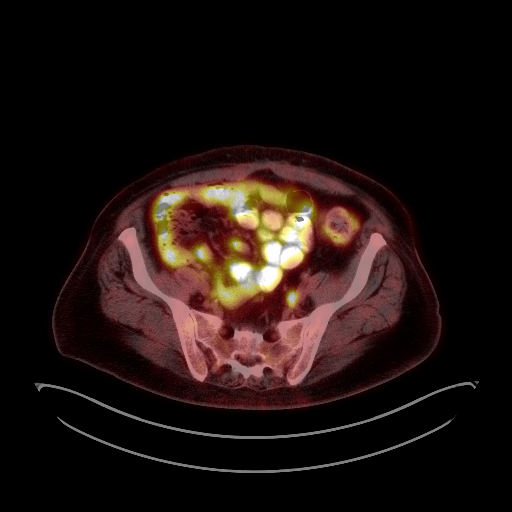
[im 212/212]
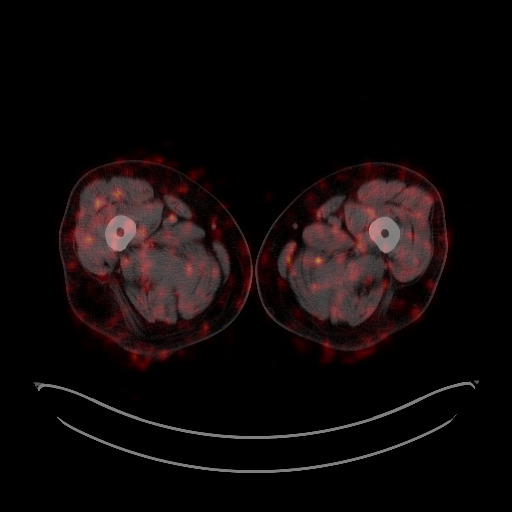

[25 of 25 positions shown; findings below may reference images not displayed]

FINDINGS: NECK

No hypermetabolic lymph nodes in the neck.

CHEST

[DATE] x 3.9 cm left mediastinal/suprahilar mass (series 4/image 9),
corresponding to known primary bronchogenic neoplasm, previously
x 6.0 cm. Max SUV 5.5.

8 mm short axis left superior mediastinal/prevascular node (series
4/ image 47), poorly visualized due to streak artifact but likely
similar on the prior, max SUV 3.0.

2.6 x 0.9 cm pleural based lesion along the left heart border
(series 4/ image 71), previously 2.7 x 1.3 cm, max SUV 3.7.

0.8 x 2.4 cm pleural based lesion along the right left lower lobe,
overlying the left 8th rib (series 4/ image 76), previously
partially obscured by trace pleural effusion. Max SUV 7.5.

Cardiomegaly.  Left chest port.

ABDOMEN/PELVIS

No abnormal hypermetabolic activity within the liver, pancreas, or
spleen.

1.6 x 1.4 cm left adrenal metastasis (series 4/image 99), previously
1.8 x 1.3 cm (grossly unchanged), max SUV 5.6.

1.5 x 1.8 cm implant along the right posterior pararenal space
(series 4/image 137), max SUV 4.8. Additional 1.3 x 1.0 cm implant
along the lateral aspect of the left psoas muscle (series 4/ image
154), max SUV 4.1.

SKELETON

No focal hypermetabolic activity to suggest skeletal metastasis.
IMPRESSION: 6.3 cm left mediastinal/suprahilar mass, corresponding to known
primary bronchogenic neoplasm, decreased.

Associated mediastinal and left pleural-based metastases, as
described above, grossly unchanged.

Stable 1.6 cm left adrenal metastasis.

Retroperitoneal metastases along the right posterior pararenal space
and left psoas muscle, as above.

## 2016-08-06 ENCOUNTER — Other Ambulatory Visit: Payer: Self-pay | Admitting: Nurse Practitioner
